# Patient Record
Sex: Male | Born: 1950
Health system: Southern US, Community
[De-identification: ages and names within clinical notes are randomized; demographics above are authoritative.]

## PROBLEM LIST (undated history)

## (undated) DIAGNOSIS — E119 Type 2 diabetes mellitus without complications: Secondary | ICD-10-CM

## (undated) DIAGNOSIS — E538 Deficiency of other specified B group vitamins: Secondary | ICD-10-CM

## (undated) DIAGNOSIS — I639 Cerebral infarction, unspecified: Secondary | ICD-10-CM

## (undated) DIAGNOSIS — E785 Hyperlipidemia, unspecified: Secondary | ICD-10-CM

## (undated) DIAGNOSIS — A809 Acute poliomyelitis, unspecified: Secondary | ICD-10-CM

## (undated) DIAGNOSIS — M5136 Other intervertebral disc degeneration, lumbar region: Secondary | ICD-10-CM

## (undated) DIAGNOSIS — I10 Essential (primary) hypertension: Secondary | ICD-10-CM

## (undated) DIAGNOSIS — H538 Other visual disturbances: Secondary | ICD-10-CM

## (undated) DIAGNOSIS — Z923 Personal history of irradiation: Secondary | ICD-10-CM

## (undated) DIAGNOSIS — M51369 Other intervertebral disc degeneration, lumbar region without mention of lumbar back pain or lower extremity pain: Secondary | ICD-10-CM

## (undated) HISTORY — DX: Other intervertebral disc degeneration, lumbar region: M51.36

## (undated) HISTORY — PX: COLONOSCOPY: SHX174

## (undated) HISTORY — DX: Hypomagnesemia: E83.42

## (undated) HISTORY — DX: Cerebral infarction, unspecified: I63.9

## (undated) HISTORY — DX: Other visual disturbances: H53.8

## (undated) HISTORY — DX: Hyperlipidemia, unspecified: E78.5

## (undated) HISTORY — DX: Other intervertebral disc degeneration, lumbar region without mention of lumbar back pain or lower extremity pain: M51.369

## (undated) HISTORY — DX: Deficiency of other specified B group vitamins: E53.8

---

## 2010-07-06 ENCOUNTER — Emergency Department (HOSPITAL_COMMUNITY)
Admission: EM | Admit: 2010-07-06 | Discharge: 2010-07-06 | Disposition: A | Discharge status: Home / Self Care | Payer: Self-pay | Attending: Emergency Medicine | Admitting: Emergency Medicine

## 2010-07-06 DIAGNOSIS — I10 Essential (primary) hypertension: Secondary | ICD-10-CM | POA: Insufficient documentation

## 2010-07-06 DIAGNOSIS — R51 Headache: Secondary | ICD-10-CM | POA: Insufficient documentation

## 2010-07-06 LAB — CBC
Hemoglobin: 16.1 g/dL (ref 13.0–17.0)
MCH: 32.3 pg (ref 26.0–34.0)
MCHC: 35.6 g/dL (ref 30.0–36.0)
MCV: 90.8 fL (ref 78.0–100.0)
Platelets: 239 10*3/uL (ref 150–400)
RBC: 4.98 MIL/uL (ref 4.22–5.81)

## 2010-07-06 LAB — BASIC METABOLIC PANEL
CO2: 25 mEq/L (ref 19–32)
Chloride: 104 mEq/L (ref 96–112)
GFR calc Af Amer: >60 mL/min (ref >60)
Potassium: 4 mEq/L (ref 3.5–5.1)

## 2010-07-06 LAB — DIFFERENTIAL
Basophils Relative: 1 % (ref 0–1)
Eosinophils Absolute: 0 10*3/uL (ref 0.0–0.7)
Lymphs Abs: 1.2 10*3/uL (ref 0.7–4.0)
Monocytes Absolute: 0.4 10*3/uL (ref 0.1–1.0)
Monocytes Relative: 8 % (ref 3–12)
Neutrophils Relative %: 72 % (ref 43–77)

## 2010-07-06 LAB — POCT CARDIAC MARKERS: Troponin i, poc: <0.05 ng/mL (ref 0.00–0.09)

## 2010-09-22 ENCOUNTER — Emergency Department (HOSPITAL_COMMUNITY): Payer: Self-pay

## 2010-09-22 ENCOUNTER — Inpatient Hospital Stay (HOSPITAL_COMMUNITY)
Admission: EM | Admit: 2010-09-22 | Discharge: 2010-09-23 | DRG: 153 | Disposition: A | Discharge status: Home / Self Care | Payer: Self-pay | Attending: Otolaryngology | Admitting: Otolaryngology

## 2010-09-22 DIAGNOSIS — J36 Peritonsillar abscess: Principal | ICD-10-CM | POA: Diagnosis present

## 2010-09-22 DIAGNOSIS — F17200 Nicotine dependence, unspecified, uncomplicated: Secondary | ICD-10-CM | POA: Diagnosis present

## 2010-09-22 LAB — CBC
HCT: 40.5 % (ref 39.0–52.0)
Hemoglobin: 14.7 g/dL (ref 13.0–17.0)
MCH: 32.4 pg (ref 26.0–34.0)
MCHC: 36.3 g/dL — ABNORMAL HIGH (ref 30.0–36.0)
MCV: 89.2 fL (ref 78.0–100.0)
RBC: 4.54 MIL/uL (ref 4.22–5.81)

## 2010-09-22 LAB — DIFFERENTIAL
Lymphs Abs: 1.6 10*3/uL (ref 0.7–4.0)
Monocytes Absolute: 1.2 10*3/uL — ABNORMAL HIGH (ref 0.1–1.0)
Monocytes Relative: 13 % — ABNORMAL HIGH (ref 3–12)
Neutro Abs: 6.7 10*3/uL (ref 1.7–7.7)
Neutrophils Relative %: 70 % (ref 43–77)

## 2010-09-22 LAB — BASIC METABOLIC PANEL
BUN: 16 mg/dL (ref 6–23)
CO2: 26 mEq/L (ref 19–32)
Chloride: 104 mEq/L (ref 96–112)
Creatinine, Ser: 0.76 mg/dL (ref 0.4–1.5)
GFR calc Af Amer: >60 mL/min (ref >60)
Glucose, Bld: 111 mg/dL — ABNORMAL HIGH (ref 70–99)

## 2010-09-22 IMAGING — CT CT NECK W/ CM
3 of 4 series · 13 of 29 positions shown, 15 images · IV contrast (100ml omni 300)
Comparison: None.

CLINICAL DATA: Severe left-sided neck pain and swelling; difficulty
swallowing.  Fever.

CT NECK WITH CONTRAST
TECHNIQUE: Multidetector CT imaging of the neck was performed with
intravenous contrast.
Contrast: 100 mL of Omnipaque 300 IV contrast

[Series 2: 2cc/30ml and 1cc/45ml · axial · 0.50mm/px · z∈[-377,-187]mm · 8 of 98 slices shown, 10 images]
[im 11/98  soft-tissue]
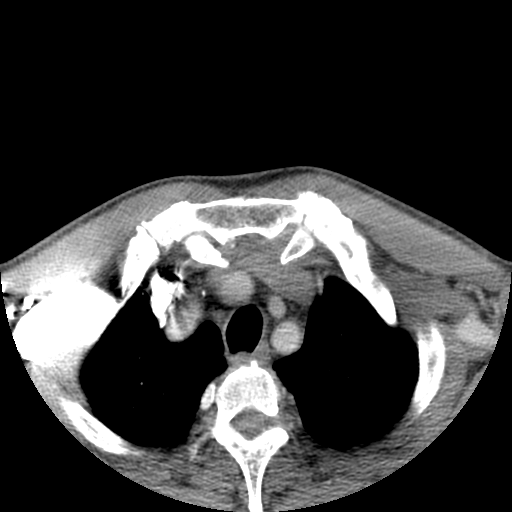
[im 11/98  bone]
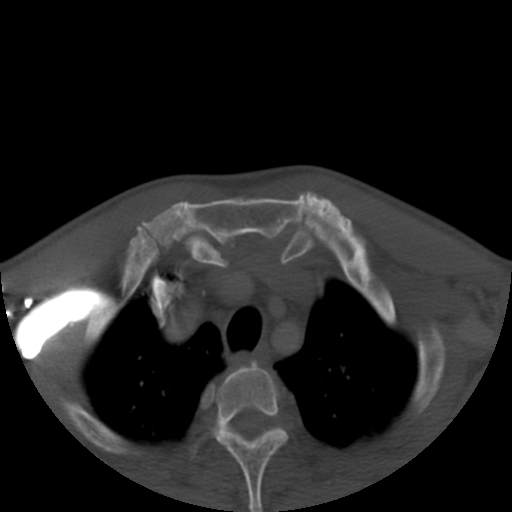
[im 22/98  bone]
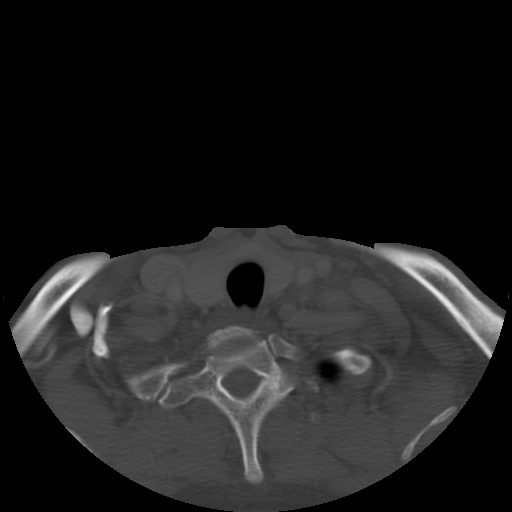
[im 33/98  bone]
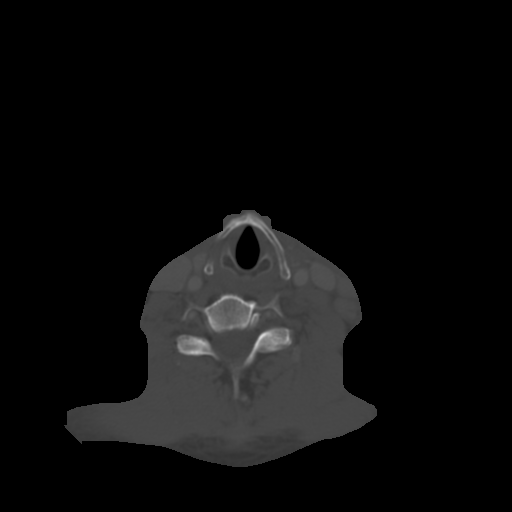
[im 44/98  bone]
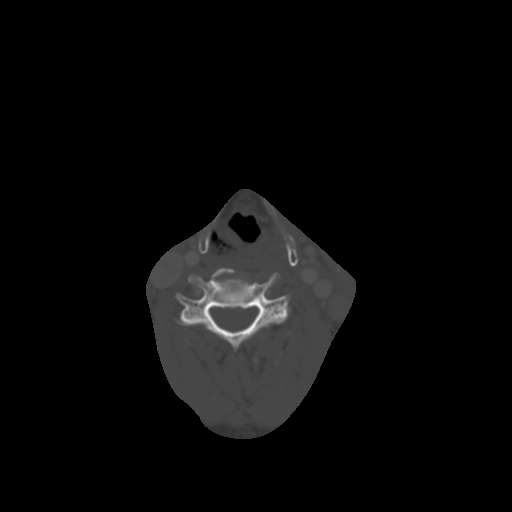
[im 54/98  soft-tissue]
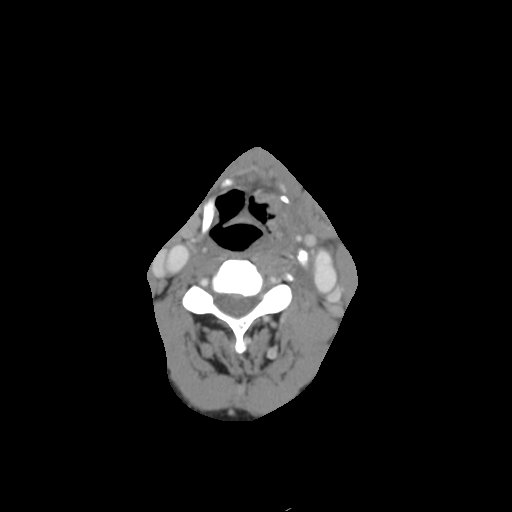
[im 54/98  bone]
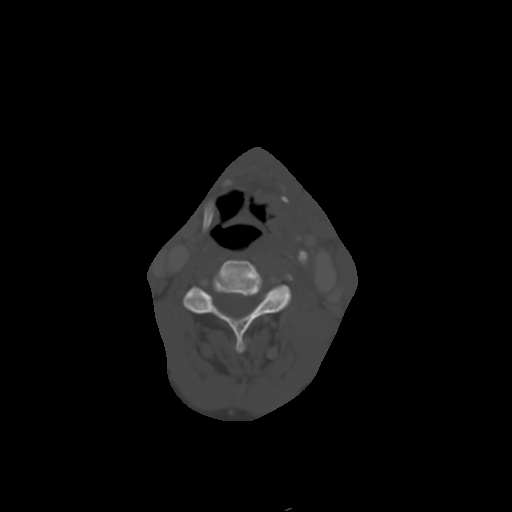
[im 65/98  bone]
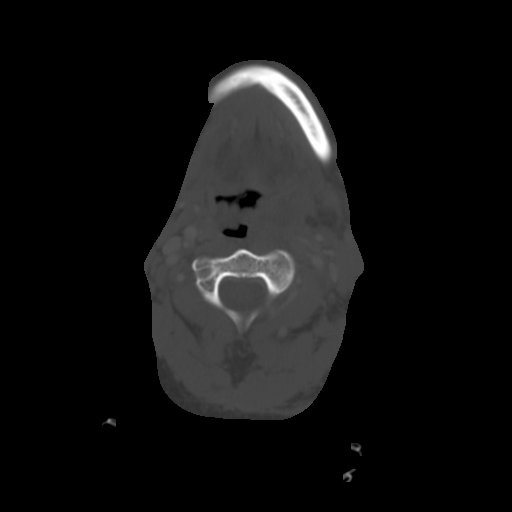
[im 76/98  bone]
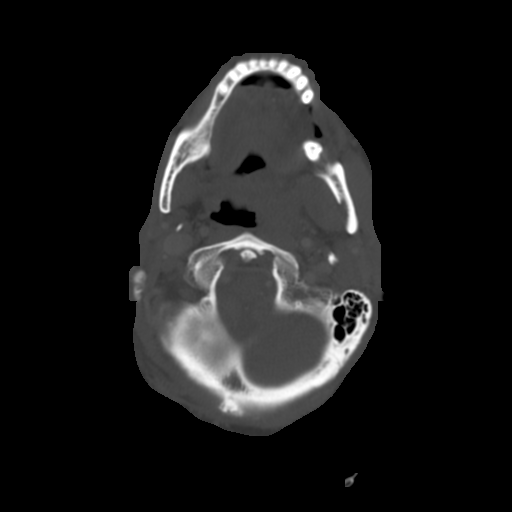
[im 87/98  bone]
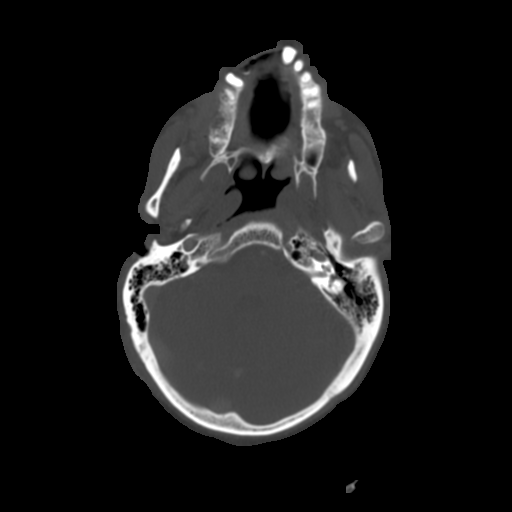

[Series 4: recon 3: 2cc/30ml and 1cc/45ml · axial · 0.50mm/px · z∈[-374,-347]mm · 2 of 34 slices shown]
[im 12/34  bone]
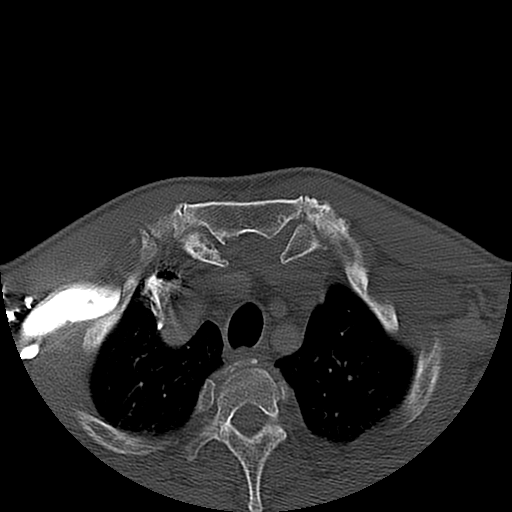
[im 23/34  bone]
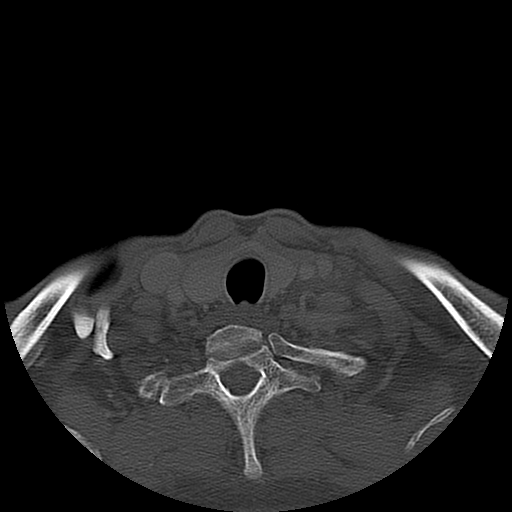

[Series 301: reformatted · coronal · 0.50mm/px · 3 of 109 slices shown]
[im 36/109  bone]
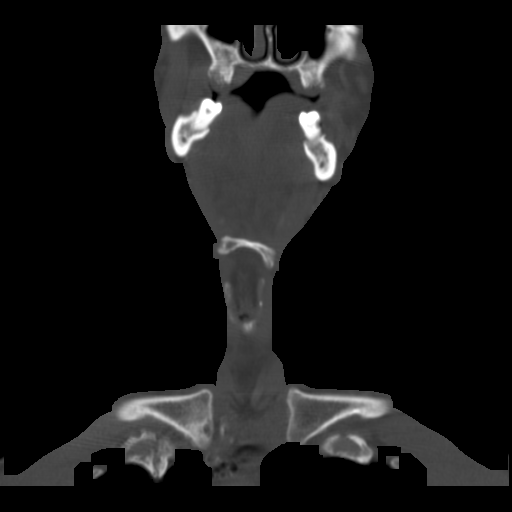
[im 45/109  bone]
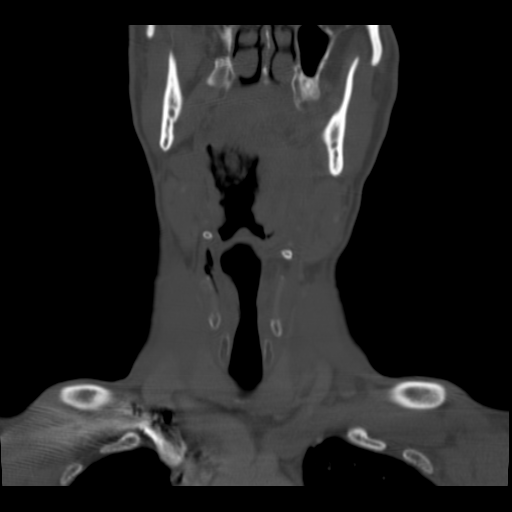
[im 53/109  bone]
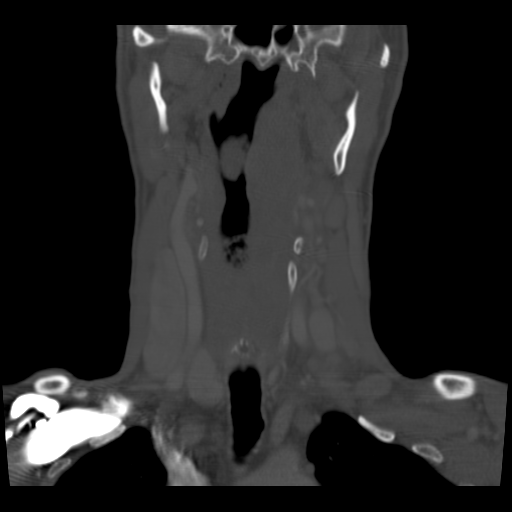

[13 of 29 positions shown; findings below may reference images not displayed]

FINDINGS: There is a focal collection of fluid noted within the
enlarged left palatine tonsil, measuring approximately 3.0 x 2.6 x
1.4 cm.  This is compatible with a peritonsillar abscess.  The left
palatine tonsil is significantly enlarged, and there is also
significant edema involving the adjacent uvula, and soft tissue
stranding involving the left parapharyngeal fat planes.

Soft tissue stranding and haziness extends inferiorly along the
left side of the neck, with mild inflammation along the left common
carotid artery and left jugular vein, and multiple enlarged
superficial cervical nodes on the left side, measuring up to 1.2 cm
in short axis.

There is also mild asymmetric enlargement of the left submandibular
gland.  There is no definite evidence of vascular compromise.
Prevertebral soft tissues remain within normal limits.

A small amount of fluid and debris is noted within the piriform
sinuses.  There is mass effect on the oropharynx and superior
hypopharynx from the left sided edema, but much of the hypopharynx
remains nearly normal in caliber.

Mild prominence of the right-sided soft tissues just above the
vocal cords may reflect transient debris, or possibly edema.  The
proximal trachea is unremarkable in appearance.  The thyroid gland
is grossly unremarkable, with a small benign-appearing 0.6 cm
hypodensity in the right thyroid lobe.  The visualized superior
mediastinum is unremarkable in appearance.  The visualized lung
apices are clear.

No acute osseous abnormalities are identified.  Multiple maxillary
and mandibular teeth are absent.  The visualized paranasal sinuses
and mastoid air cells are well-aerated.  The visualized portions of
the brain are within normal limits.
IMPRESSION: 1.  Focal 3.0 x 2.6 x 1.4 cm left-sided peritonsillar abscess noted
within the enlarged left palatine tonsil.
2.  Soft tissue edema involves the left palatine tonsil and
adjacent uvula, with partial effacement of the oropharynx and
superior hypopharynx.
3.  Soft tissue stranding and haziness extends inferiorly along the
left side of the neck, from the parapharyngeal fat planes along the
left common carotid artery and left jugular vein, with multiple
enlarged left-sided superficial cervical nodes measuring up to
cm in short axis.  No evidence of vascular compromise.
4.  Mild asymmetric enlargement of the left submandibular gland.
5.  Small amount of fluid and debris noted within the piriform
sinuses.
6.  Mild prominence of right-sided soft tissues just above the
vocal cords, reflecting transient debris or possibly edema.

Findings were discussed with Dr. GABRIEL SEBASTIAN at [DATE] a.m. on
[DATE].

## 2010-09-22 MED ORDER — IOHEXOL 300 MG/ML  SOLN
100.0000 mL | Freq: Once | INTRAMUSCULAR | Status: AC | PRN
  Administered 2010-09-22: 100 mL via INTRAVENOUS

## 2010-12-01 NOTE — Discharge Summary (Signed)
  NAMEJOHNRYAN, SAO NO.:  [id number]  MEDICAL RECORD NO.:  [id number]  LOCATION:  5118                         FACILITY:  [facility]  PHYSICIAN:  Suzanna Obey, M.D.       DATE OF BIRTH:  1950/09/26  DATE OF ADMISSION:  09/22/2010 DATE OF DISCHARGE:  09/23/2010                              DISCHARGE SUMMARY   ADMISSION DIAGNOSIS:  Peritonsillar abscess.  DISCHARGE DIAGNOSIS:  Peritonsillar abscess.  HOSPITAL COURSE:  This patient was admitted, had an incision and drainage in the emergency room of a peritonsillar abscess and did well. He requested admission for persistent pain and was admitted overnight. In the morning, his pain was resolved and he was feeling great, taking fluids well, substantial decrease in the swelling of the palate and uvula, discharged to home to follow up in 2 weeks.  Meds given for antibiotics and pain medicine that are present in the chart.          ______________________________ Suzanna Obey, M.D.    JB/MEDQ  D:  11/24/2010  T:  11/24/2010  Job:  [id number]  Electronically Signed by Suzanna Obey M.D. on 12/01/2010 10:25:43 AM

## 2020-06-04 ENCOUNTER — Other Ambulatory Visit: Payer: Self-pay

## 2020-06-04 ENCOUNTER — Ambulatory Visit (HOSPITAL_COMMUNITY)
Admission: EM | Admit: 2020-06-04 | Discharge: 2020-06-04 | Disposition: A | Discharge status: Home / Self Care | Payer: Self-pay

## 2020-06-04 ENCOUNTER — Encounter (HOSPITAL_COMMUNITY): Payer: Self-pay

## 2020-06-04 ENCOUNTER — Emergency Department (HOSPITAL_COMMUNITY): Payer: Self-pay

## 2020-06-04 ENCOUNTER — Emergency Department (HOSPITAL_COMMUNITY)
Admission: EM | Admit: 2020-06-04 | Discharge: 2020-06-04 | Disposition: A | Discharge status: Home / Self Care | Payer: Self-pay | Attending: Emergency Medicine | Admitting: Emergency Medicine

## 2020-06-04 DIAGNOSIS — I10 Essential (primary) hypertension: Secondary | ICD-10-CM | POA: Insufficient documentation

## 2020-06-04 DIAGNOSIS — R519 Headache, unspecified: Secondary | ICD-10-CM | POA: Insufficient documentation

## 2020-06-04 DIAGNOSIS — Z87891 Personal history of nicotine dependence: Secondary | ICD-10-CM | POA: Insufficient documentation

## 2020-06-04 DIAGNOSIS — Z79899 Other long term (current) drug therapy: Secondary | ICD-10-CM | POA: Insufficient documentation

## 2020-06-04 HISTORY — DX: Essential (primary) hypertension: I10

## 2020-06-04 HISTORY — DX: Acute poliomyelitis, unspecified: A80.9

## 2020-06-04 LAB — COMPREHENSIVE METABOLIC PANEL
ALT: 27 U/L (ref 0–44)
AST: 23 U/L (ref 15–41)
Albumin: 3.2 g/dL — ABNORMAL LOW (ref 3.5–5.0)
Alkaline Phosphatase: 92 U/L (ref 38–126)
Anion gap: 8 (ref 5–15)
BUN: 18 mg/dL (ref 8–23)
CO2: 25 mmol/L (ref 22–32)
Calcium: 10 mg/dL (ref 8.9–10.3)
Chloride: 108 mmol/L (ref 98–111)
Creatinine, Ser: 1.02 mg/dL (ref 0.61–1.24)
GFR, Estimated: >60 mL/min (ref >60)
Glucose, Bld: 112 mg/dL — ABNORMAL HIGH (ref 70–99)
Potassium: 3.7 mmol/L (ref 3.5–5.1)
Sodium: 141 mmol/L (ref 135–145)
Total Bilirubin: 1 mg/dL (ref 0.3–1.2)
Total Protein: 6.5 g/dL (ref 6.5–8.1)

## 2020-06-04 LAB — CBC WITH DIFFERENTIAL/PLATELET
Abs Immature Granulocytes: 0.02 10*3/uL (ref 0.00–0.07)
Basophils Absolute: 0 10*3/uL (ref 0.0–0.1)
Basophils Relative: 0 %
Eosinophils Absolute: 0.1 10*3/uL (ref 0.0–0.5)
Eosinophils Relative: 2 %
HCT: 43.4 % (ref 39.0–52.0)
Hemoglobin: 15 g/dL (ref 13.0–17.0)
Immature Granulocytes: 0 %
Lymphocytes Relative: 38 %
Lymphs Abs: 2.1 10*3/uL (ref 0.7–4.0)
MCH: 32.5 pg (ref 26.0–34.0)
MCHC: 34.6 g/dL (ref 30.0–36.0)
MCV: 93.9 fL (ref 80.0–100.0)
Monocytes Absolute: 0.7 10*3/uL (ref 0.1–1.0)
Monocytes Relative: 13 %
Neutro Abs: 2.5 10*3/uL (ref 1.7–7.7)
Neutrophils Relative %: 47 %
Platelets: 202 10*3/uL (ref 150–400)
RBC: 4.62 MIL/uL (ref 4.22–5.81)
RDW: 13.7 % (ref 11.5–15.5)
WBC: 5.4 10*3/uL (ref 4.0–10.5)
nRBC: 0 % (ref 0.0–0.2)

## 2020-06-04 LAB — TROPONIN I (HIGH SENSITIVITY): Troponin I (High Sensitivity): 23 ng/L — ABNORMAL HIGH (ref <18)

## 2020-06-04 LAB — BRAIN NATRIURETIC PEPTIDE: B Natriuretic Peptide: 84.6 pg/mL (ref 0.0–100.0)

## 2020-06-04 LAB — CBG MONITORING, ED: Glucose-Capillary: 103 mg/dL — ABNORMAL HIGH (ref 70–99)

## 2020-06-04 IMAGING — DX DG CHEST 2V
2 series · 2 of 2 positions shown · non-contrast
Comparison: None.

CLINICAL DATA: Hypertension

EXAM:
CHEST - 2 VIEW

[chest pa]
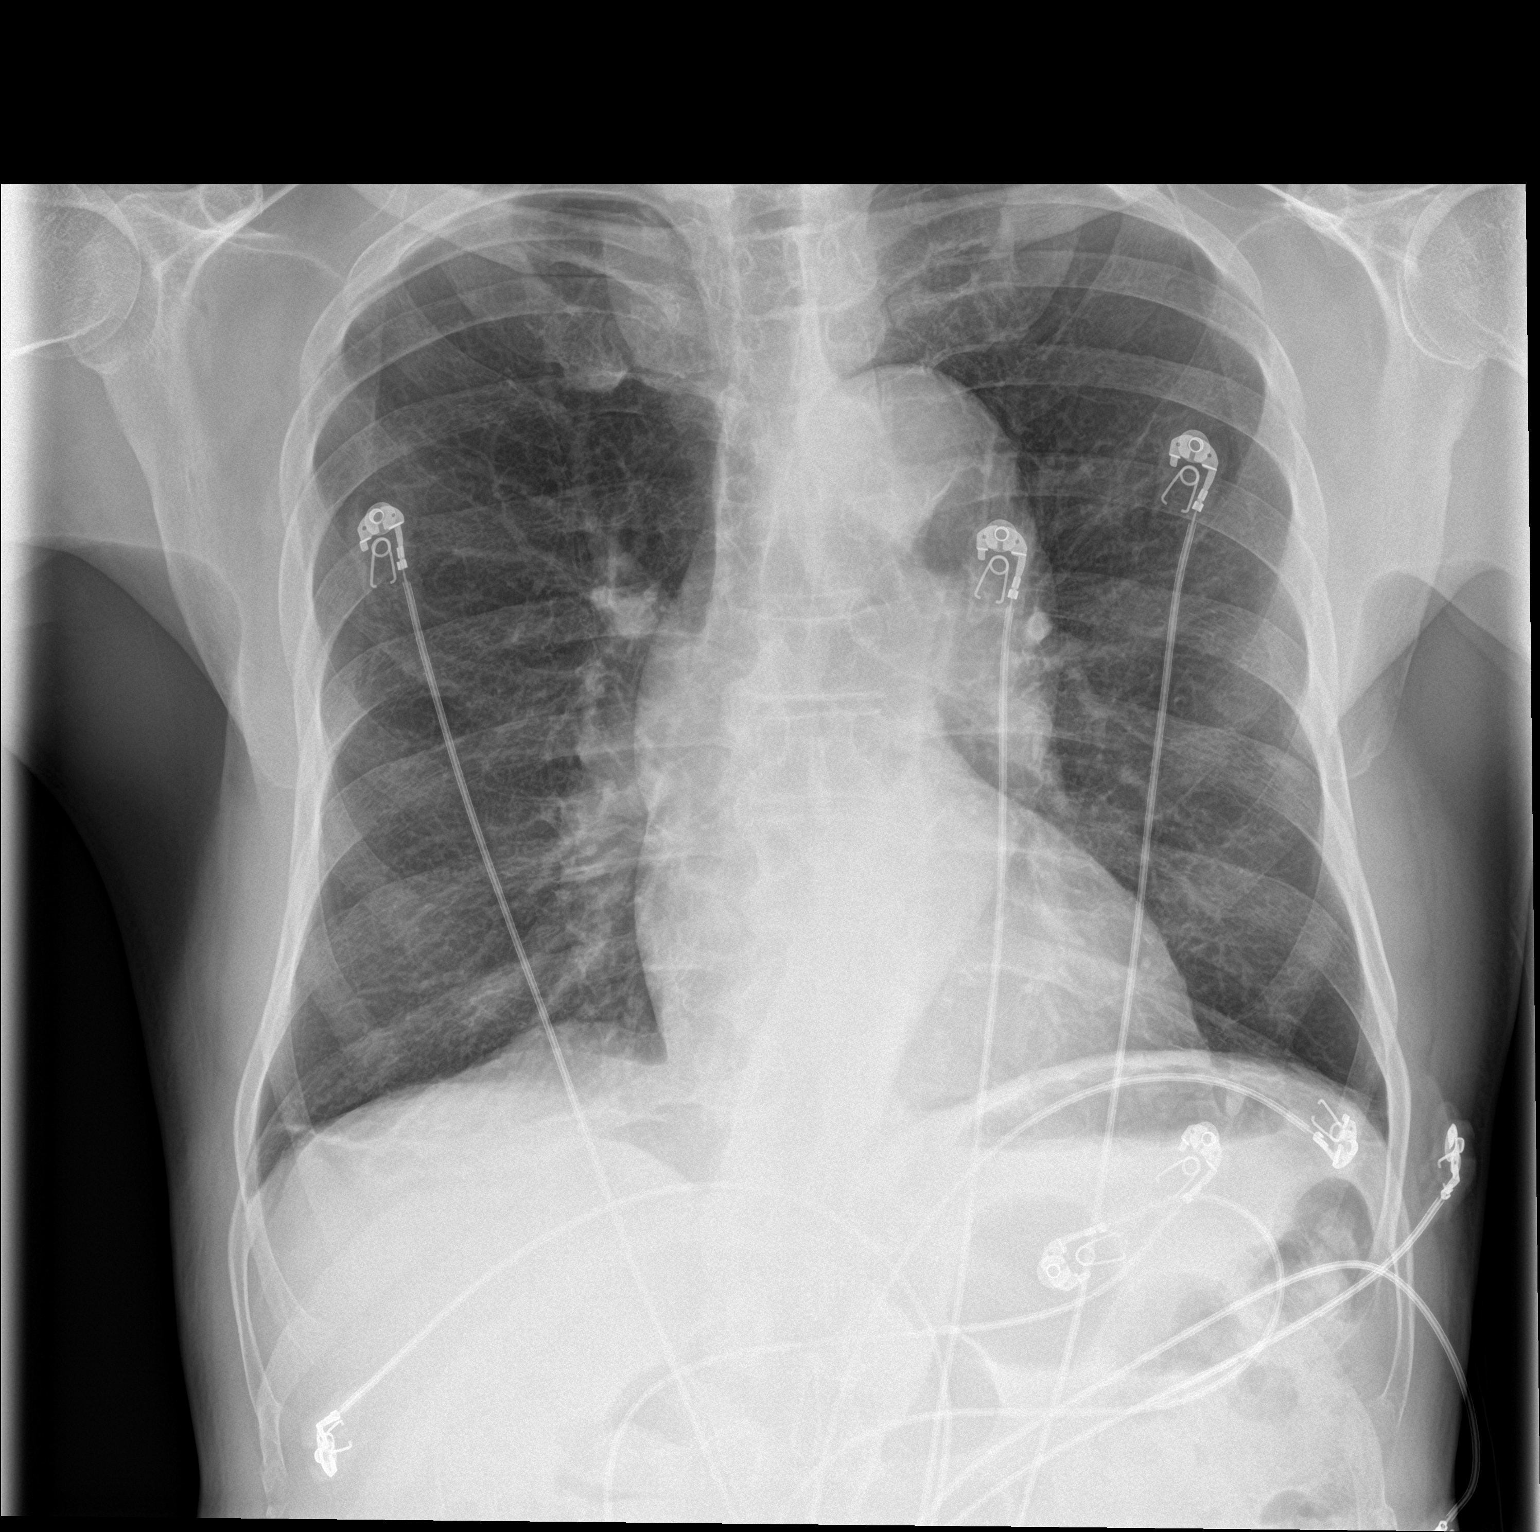

[chest lat]
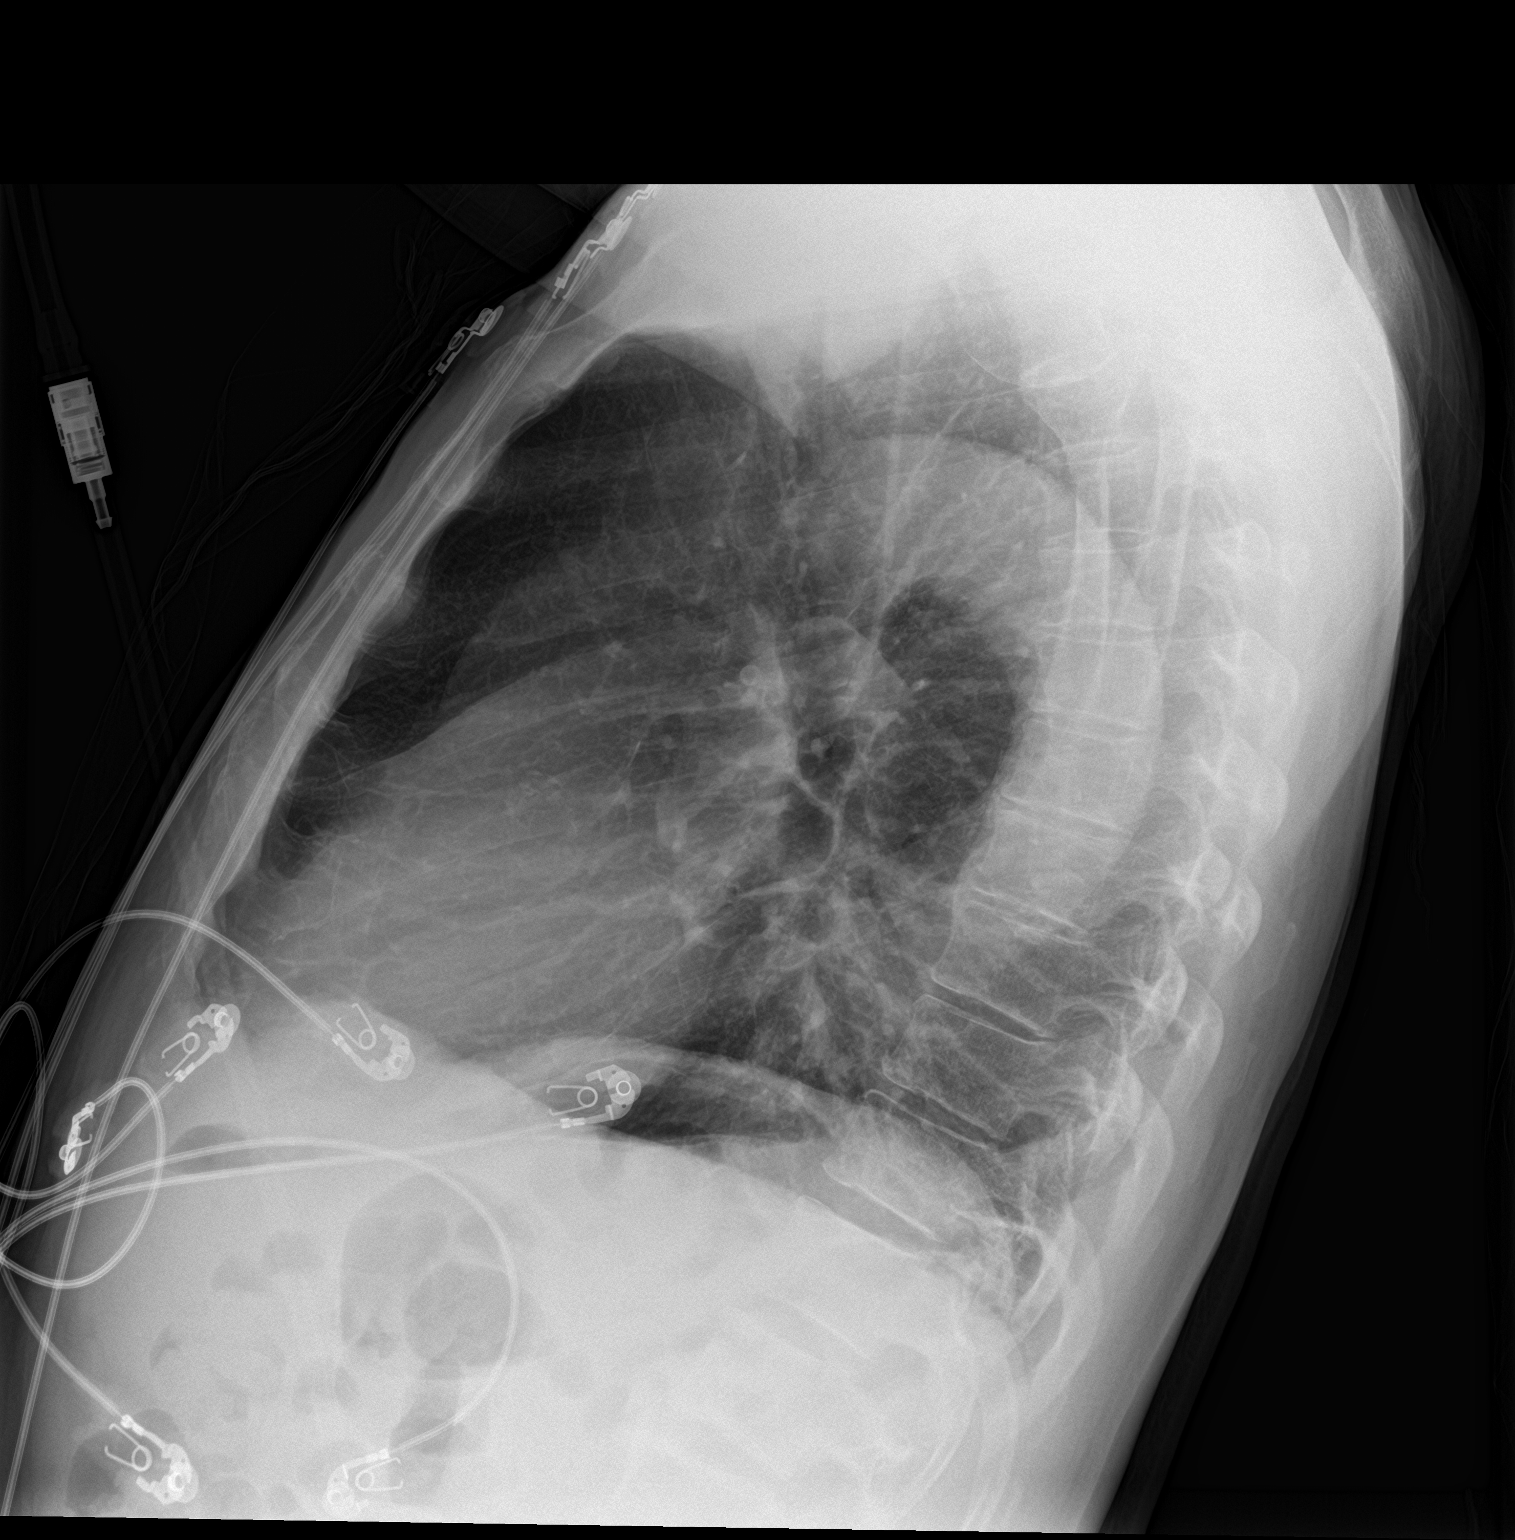

[2 of 2 positions shown; findings below may reference images not displayed]

FINDINGS: Heart size is normal. Atherosclerosis of the aorta with tortuosity.
Pulmonary vascularity is normal. The lungs are clear. No effusions.
No significant bone finding.
IMPRESSION: No active disease. Aortic atherosclerosis and tortuosity.

## 2020-06-04 IMAGING — CT CT HEAD W/O CM
4 series · 17 of 47 positions shown, 19 images · non-contrast
Comparison: None.

CLINICAL DATA: 69-year-old male with headache.

EXAM:
CT HEAD WITHOUT CONTRAST
TECHNIQUE: Contiguous axial images were obtained from the base of the skull
through the vertex without intravenous contrast.

[Series 3: head without · axial · non-contrast · 0.41mm/px · z∈[-208,-88]mm · 7 of 33 slices shown, 9 images]
[im 5/33  brain]
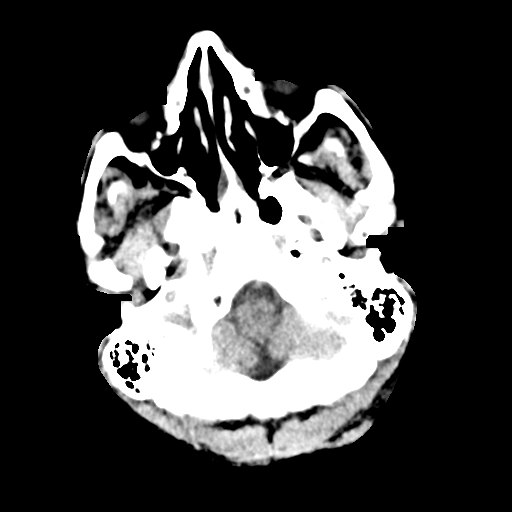
[im 5/33  bone]
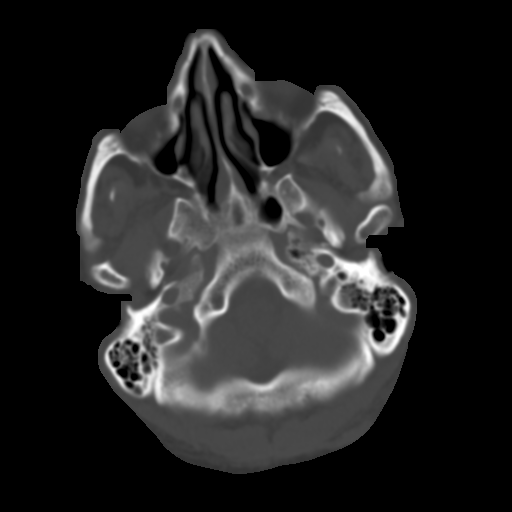
[im 9/33  brain]
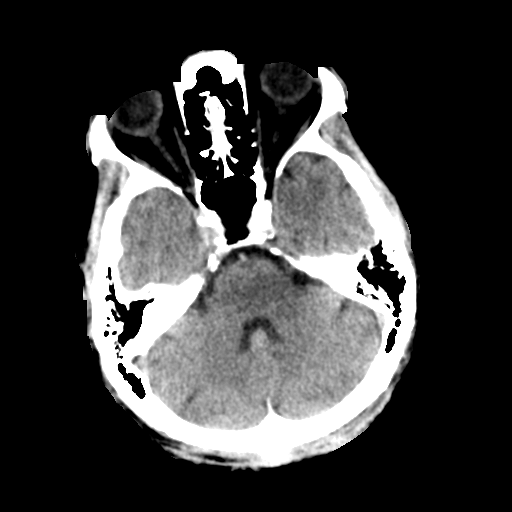
[im 13/33  brain]
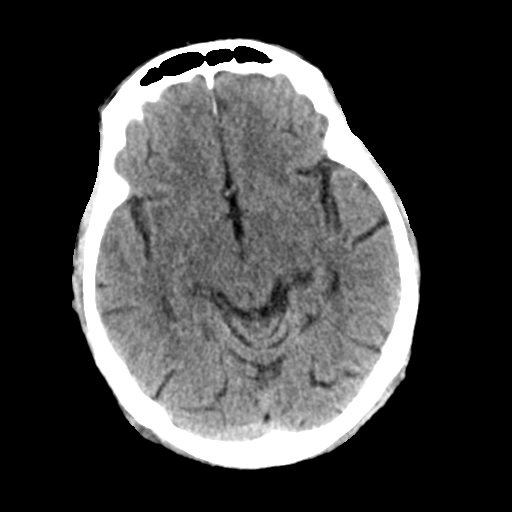
[im 17/33  brain]
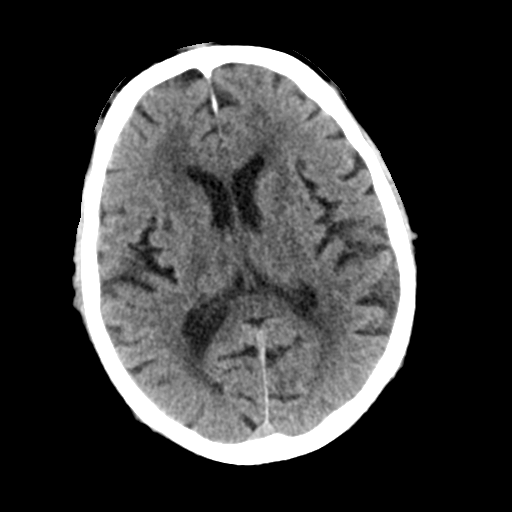
[im 21/33  brain]
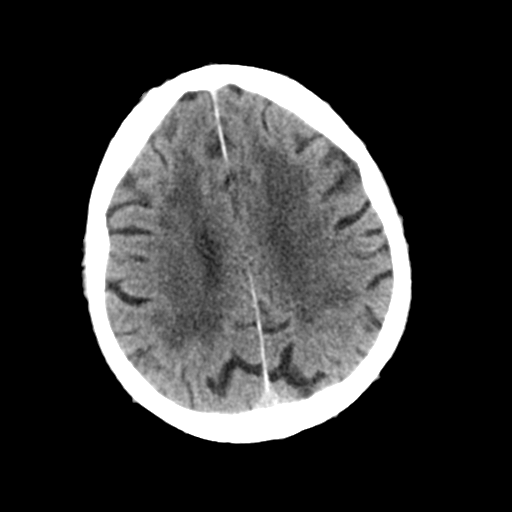
[im 21/33  bone]
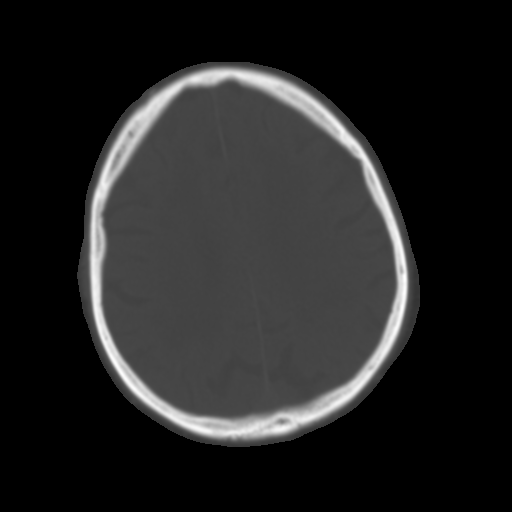
[im 25/33  brain]
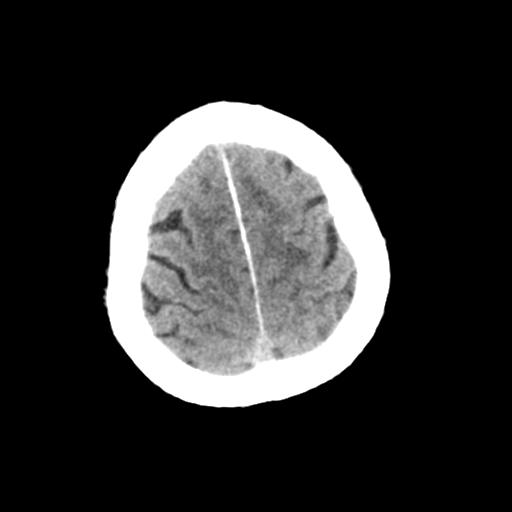
[im 29/33  brain]
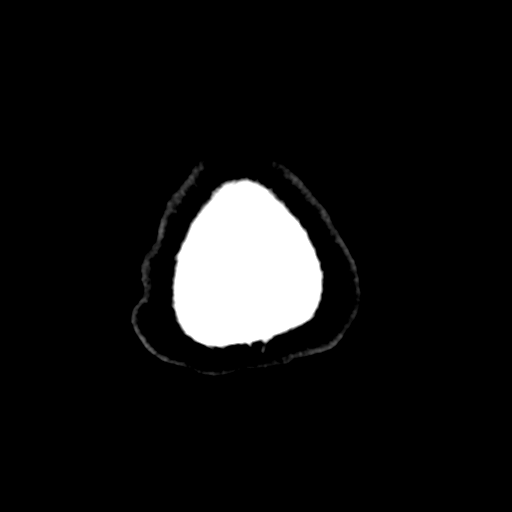

[Series 4: head bone · axial · 0.41mm/px · z∈[-212,-156]mm · 4 of 82 slices shown]
[im 9/82  bone]
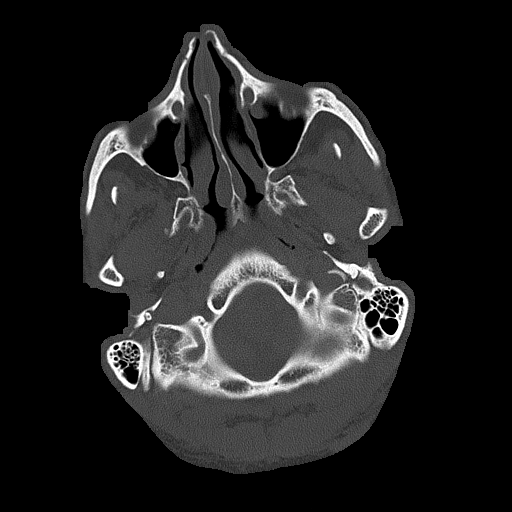
[im 17/82  bone]
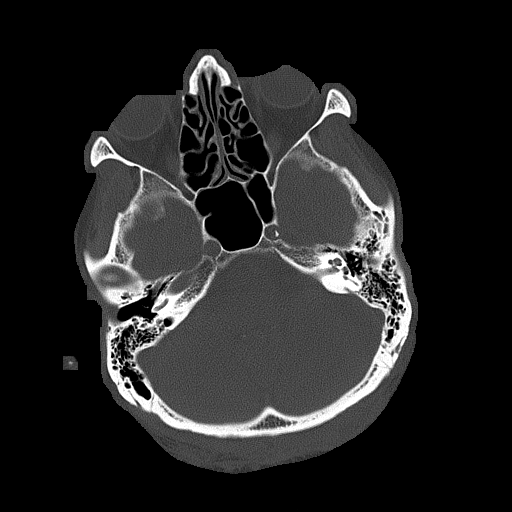
[im 25/82  bone]
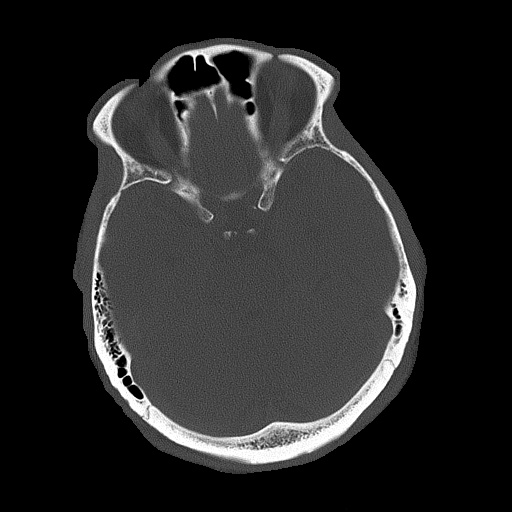
[im 37/82  bone]
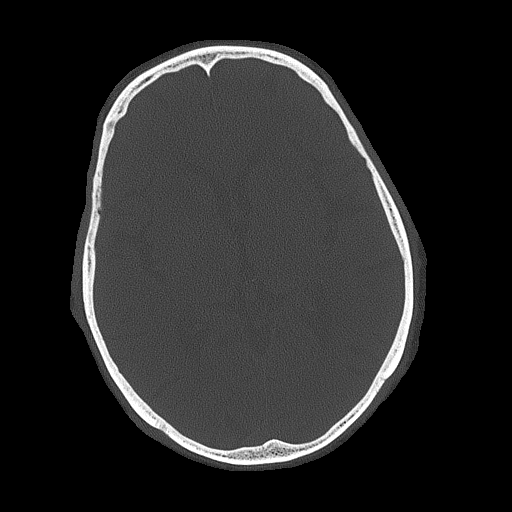

[Series 5: head without cor · coronal · non-contrast · 0.32mm/px · 3 of 67 slices shown]
[im 23/67  brain]
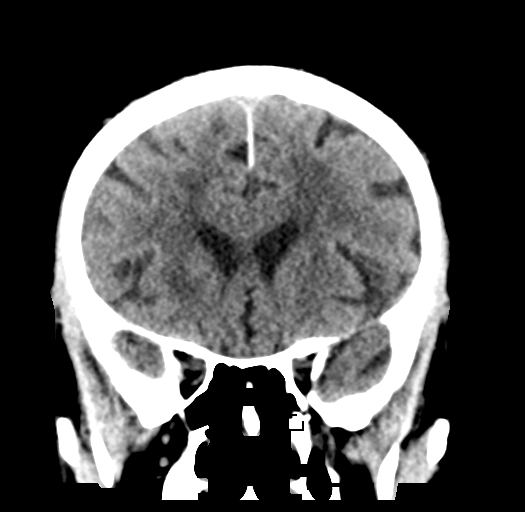
[im 30/67  brain]
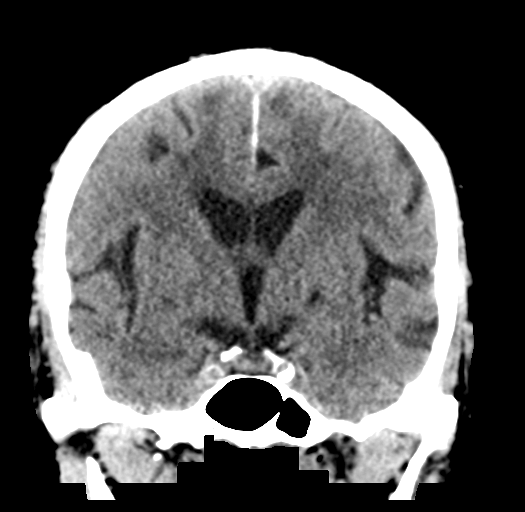
[im 37/67  brain]
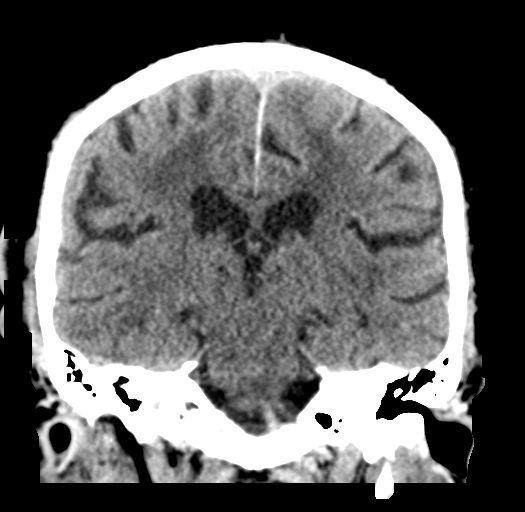

[Series 6: head without sag · sagittal · non-contrast · 0.32mm/px · 3 of 60 slices shown]
[im 20/60  brain]
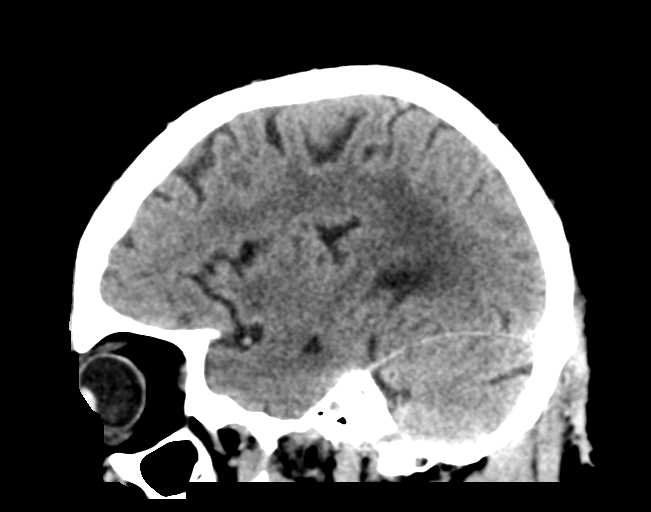
[im 30/60  brain]
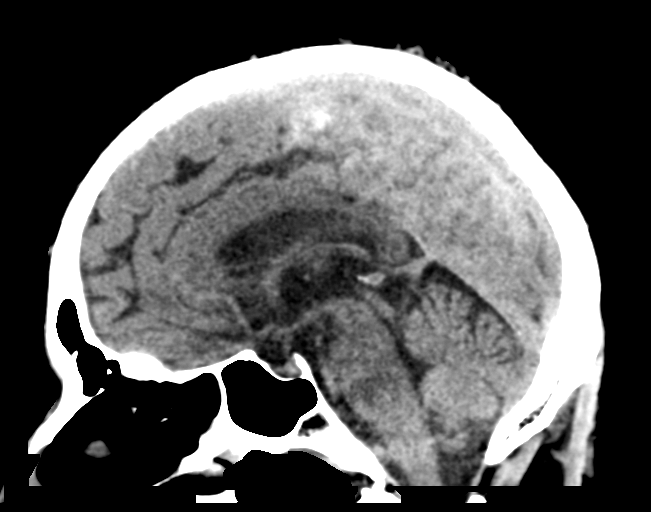
[im 40/60  brain]
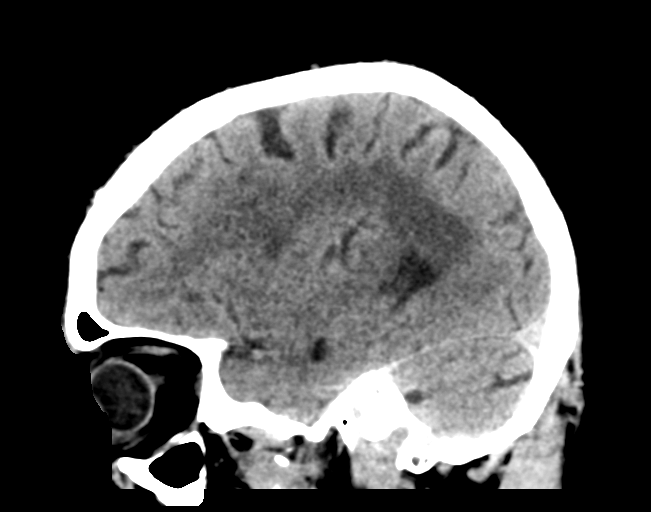

[17 of 47 positions shown; findings below may reference images not displayed]

FINDINGS: Brain: Mild age-related atrophy and moderate chronic microvascular
ischemic changes. There is no acute intracranial hemorrhage. No mass
effect or midline shift. No extra-axial fluid collection.

Vascular: No hyperdense vessel or unexpected calcification.

Skull: Normal. Negative for fracture or focal lesion.

Sinuses/Orbits: Mild mucoperiosteal thickening of paranasal sinuses.
No air-fluid level. The mastoid air cells are clear.

Other: None
IMPRESSION: 1. No acute intracranial pathology.
2. Age-related atrophy and chronic microvascular ischemic changes.

## 2020-06-04 MED ORDER — AMLODIPINE BESYLATE 5 MG PO TABS
5.0000 mg | ORAL_TABLET | Freq: Once | ORAL | Status: AC
  Administered 2020-06-04: 5 mg via ORAL
  Filled 2020-06-04: qty 1

## 2020-06-04 MED ORDER — AMLODIPINE BESYLATE 5 MG PO TABS: 5.0000 mg | ORAL_TABLET | Freq: Every day | ORAL | 0 refills | Status: DC

## 2020-06-04 NOTE — ED Provider Notes (Signed)
[facility] EMERGENCY DEPARTMENT Provider Note   CSN: [id number] Arrival date & time: 06/04/20  1901     History Chief Complaint  Patient presents with  . Code STEMI  . Hypertension  . Headache    Gavin Dennis is a 70 y.o. male who presents today from urgent care for concern of a code STEMI.  On arrival code STEMI was canceled by Dr. Gilford Raid.  Patient states that he went to urgent care today as he states that since he turned 8 in September he has had a headache in the front of his head every single day.  He states that he has not had his blood pressure medicines in 4 years because he is "hard headed."  He denies any vision changes, worsening in his headache, fevers or any other changes simply stating "enough is enough."  He denies any chest pain, chest pressure/discomfort or shortness of breath.  He states that he does not get sick and has not been ill recently.  About a month and a half ago patient reportedly woke up 1 day and decided that he was done smoking, drinking any alcohol and using crack and stopped all of these cold [place].    HPI     Past Medical History:  Diagnosis Date  . Hypertension   . Polio     There are no problems to display for this patient.   History reviewed. No pertinent surgical history.     No family history on file.  Social History   Tobacco Use  . Smoking status: Former Smoker    Years: 30.00    Types: Cigarettes  . Smokeless tobacco: Never Used  . Tobacco comment: quit a mo ago  Substance Use Topics  . Alcohol use: Not Currently    Comment: quit a mo ago  . Drug use: Not Currently    Types: Cocaine    Home Medications Prior to Admission medications   Medication Sig Start Date End Date Taking? Authorizing Provider  amLODipine (NORVASC) 5 MG tablet Take 1 tablet (5 mg total) by mouth daily. 06/04/20 07/04/20 Yes Lorin Glass, PA-C    Allergies    Patient has no known allergies.  Review of Systems    Review of Systems  Constitutional: Negative for chills, diaphoresis, fatigue and fever.  HENT: Negative for dental problem, facial swelling, sinus pressure and sinus pain.   Eyes: Negative for pain and visual disturbance.  Respiratory: Negative for cough, chest tightness and shortness of breath.   Cardiovascular: Negative for chest pain, palpitations and leg swelling.  Gastrointestinal: Negative for abdominal pain, nausea and vomiting.  Genitourinary: Negative for dysuria.  Musculoskeletal: Negative for back pain and neck pain.  Neurological: Positive for headaches. Negative for speech difficulty, weakness and light-headedness.  All other systems reviewed and are negative.   Physical Exam Updated Vital Signs BP (!) 211/139   Pulse 71   Temp 98 F (36.7 C) (Oral)   Resp 20   Ht 5\' 11"  (1.803 m)   Wt 72.6 kg   SpO2 100%   BMI 22.32 kg/m   Physical Exam Vitals and nursing note reviewed.  Constitutional:      General: He is not in acute distress.    Appearance: He is not diaphoretic.  HENT:     Head: Normocephalic and atraumatic.  Eyes:     General: No scleral icterus.       Right eye: No discharge.        Left  eye: No discharge.     Conjunctiva/sclera: Conjunctivae normal.  Cardiovascular:     Rate and Rhythm: Normal rate and regular rhythm.     Heart sounds: Normal heart sounds. No murmur heard. No friction rub.  Pulmonary:     Effort: Pulmonary effort is normal. No respiratory distress.     Breath sounds: Normal breath sounds. No stridor.  Abdominal:     General: There is no distension.     Tenderness: There is no abdominal tenderness.  Musculoskeletal:        General: No deformity.     Cervical back: Normal range of motion.  Skin:    General: Skin is warm and dry.  Neurological:     Mental Status: He is alert and oriented to person, place, and time.     GCS: GCS eye subscore is 4. GCS verbal subscore is 5. GCS motor subscore is 6.     Motor: No abnormal  muscle tone.     Comments: Patient is awake and alert, answers all questions appropriately.  Speech is not slurred.  Psychiatric:        Mood and Affect: Mood normal.     Comments: Mood and behavior are appropriate for situation     ED Results / Procedures / Treatments   Labs (all labs ordered are listed, but only abnormal results are displayed) Labs Reviewed  COMPREHENSIVE METABOLIC PANEL - Abnormal; Notable for the following components:      Result Value   Glucose, Bld 112 (*)    Albumin 3.2 (*)    All other components within normal limits  CBG MONITORING, ED - Abnormal; Notable for the following components:   Glucose-Capillary 103 (*)    All other components within normal limits  TROPONIN I (HIGH SENSITIVITY) - Abnormal; Notable for the following components:   Troponin I (High Sensitivity) 23 (*)    All other components within normal limits  CBC WITH DIFFERENTIAL/PLATELET  BRAIN NATRIURETIC PEPTIDE  TROPONIN I (HIGH SENSITIVITY)    EKG None  Radiology DG Chest 2 View  Result Date: 06/04/2020 CLINICAL DATA:  Hypertension EXAM: CHEST - 2 VIEW COMPARISON:  None. FINDINGS: Heart size is normal. Atherosclerosis of the aorta with tortuosity. Pulmonary vascularity is normal. The lungs are clear. No effusions. No significant bone finding. IMPRESSION: No active disease. Aortic atherosclerosis and tortuosity. Electronically Signed   By: Nelson Chimes M.D.   On: 06/04/2020 19:47   CT Head Wo Contrast  Result Date: 06/04/2020 CLINICAL DATA:  70 year old male with headache. EXAM: CT HEAD WITHOUT CONTRAST TECHNIQUE: Contiguous axial images were obtained from the base of the skull through the vertex without intravenous contrast. COMPARISON:  None. FINDINGS: Brain: Mild age-related atrophy and moderate chronic microvascular ischemic changes. There is no acute intracranial hemorrhage. No mass effect or midline shift. No extra-axial fluid collection. Vascular: No hyperdense vessel or unexpected  calcification. Skull: Normal. Negative for fracture or focal lesion. Sinuses/Orbits: Mild mucoperiosteal thickening of paranasal sinuses. No air-fluid level. The mastoid air cells are clear. Other: None IMPRESSION: 1. No acute intracranial pathology. 2. Age-related atrophy and chronic microvascular ischemic changes. Electronically Signed   By: Anner Crete M.D.   On: 06/04/2020 20:31    Procedures Procedures   Medications Ordered in ED Medications  amLODipine (NORVASC) tablet 5 mg (5 mg Oral Given 06/04/20 2045)    ED Course  I have reviewed the triage vital signs and the nursing notes.  Pertinent labs & imaging results that were available during  my care of the patient were reviewed by me and considered in my medical decision making (see chart for details).  Clinical Course as of 06/04/20 2334  Fri Jun 04, 2020  1910 Per Dr. Gilford Raid cancel code stemi [EH]    Clinical Course User Index [EH] Ollen Gross   MDM Rules/Calculators/A&P                         Patient is a 70 year old gentleman who presents today for code STEMI.  This was canceled on arrival by Dr. Gilford Raid.  Patient does not have any chest pain or shortness of breath and had originally gone to urgent care due to constant headache for the past 5 months and was found to be hypertensive.  He does not have any chest pain/discomfort.  Troponin is 23, not consistent with MI. CBC and CMP are unremarkable. BNP is not elevated, chest x-ray shows no active disease, does show a tortuous aorta however he is not having chest pain shortness of breath or any other symptoms that would be suggestive of dissection.  Given this chronic headache CT head was obtained without acute abnormality. He is started on p.o. Norvasc and given a prescription for this. Case management is consulted for assistance and PCP.  This patient was discussed with Dr. Gilford Raid.     Return precautions were discussed with patient who states their  understanding.  At the time of discharge patient denied any unaddressed complaints or concerns.  Patient is agreeable for discharge home.  Note: Portions of this report may have been transcribed using voice recognition software. Every effort was made to ensure accuracy; however, inadvertent computerized transcription errors may be present.   Final Clinical Impression(s) / ED Diagnoses Final diagnoses:  Hypertension, unspecified type  Chronic daily headache    Rx / DC Orders ED Discharge Orders         Ordered    amLODipine (NORVASC) 5 MG tablet  Daily        06/04/20 2123           Ollen Gross 06/04/20 2336    Isla Pence, MD 06/07/20 [phone]

## 2020-06-04 NOTE — Discharge Instructions (Addendum)
As we discussed today I have given you a prescription to start lowering your blood pressure.  This has been high for an extended period of time and therefore our goal is to slowly have it be lowered. It is important that you establish care with a primary care doctor for continued blood pressure medicine refills.  When your blood pressure is high it is doing damage even if you cannot feel it.  If your symptoms change, you develop any change in your headache, any chest pain/discomfort, shortness of breath or have any new or concerning symptoms please seek additional medical care and evaluation.  I have given you a month supply of amlodipine, however please still attempt to get seen by a primary care doctor as soon as possible.

## 2020-06-04 NOTE — ED Notes (Signed)
EMS notified us that patient was a Code Stemi and was taken straight to cath lab.

## 2020-06-04 NOTE — ED Triage Notes (Signed)
Pt presented today with complaint of elevated blood pressure and headache x 1 year. Denies chest pain or SOB. Present with step-daughter.

## 2020-06-04 NOTE — Progress Notes (Signed)
CSW met with Pt at bedside. CSW offered [facility] resources and discussed importance of seeing PCP regularly. Pt verbalized understanding and daughter offered help with making appointment.   06/04/20 2130  TOC ED Mini Assessment  TOC Time spent with patient (minutes): 20  PING Used in TOC Assessment No  Admission or Readmission Diverted Yes  Interventions which prevented an admission or readmission Other (must enter comment) (PCP resources provided)  What brought you to the Emergency Department?  Code stemi  Barriers to Discharge No Barriers Identified  Barrier interventions Pt counseled on the importance of primary care.  Pt's daughter present and is able to assist with making/keeping PCP appointments.  Means of departure Not know

## 2020-06-04 NOTE — ED Triage Notes (Addendum)
Pt arrived [facility] from [facility] as a code STEMI. Per EMS pt has elevation in leads V2-V4. Pt is A&Ox4. Pt is very hypertensive. Pt is NSR on monitor. Pt denies chest pain. Pt states has 2/10 head pain. Pt denies vision changes. EMS gave ASA 324 mg and nitro 0.4 mg

## 2020-06-07 ENCOUNTER — Telehealth: Payer: Self-pay

## 2020-06-07 NOTE — Telephone Encounter (Signed)
Patient is calling to set up financial Assistance appt with Clifton Elion. He has a hospital Follow up with Sharyn Lull on 06/22/20. Please advise CB- [phone]

## 2020-06-07 NOTE — Telephone Encounter (Signed)
Call patient at both number.  Left message on voicemail to return call.   Scheduled an appointment for patient with Financial counselor this week- 06/10/2020 at 1500. Patient will need to be notified.

## 2020-06-08 ENCOUNTER — Telehealth: Payer: Self-pay

## 2020-06-08 NOTE — Telephone Encounter (Signed)
I called the Pt to inform that he need to see the PCP first before he can see the financial to apply for the CAFA and OC program, unable to LVM, appt was cancel for this reason

## 2020-06-10 ENCOUNTER — Ambulatory Visit: Payer: Self-pay

## 2020-06-22 ENCOUNTER — Ambulatory Visit (INDEPENDENT_AMBULATORY_CARE_PROVIDER_SITE_OTHER): Payer: Self-pay | Admitting: Primary Care

## 2020-06-22 ENCOUNTER — Other Ambulatory Visit: Payer: Self-pay

## 2020-06-22 ENCOUNTER — Encounter (INDEPENDENT_AMBULATORY_CARE_PROVIDER_SITE_OTHER): Payer: Self-pay | Admitting: Primary Care

## 2020-06-22 VITALS — BP 179/118 | HR 60 | Temp 97.5°F | Ht 71.0 in | Wt 173.8 lb

## 2020-06-22 DIAGNOSIS — I10 Essential (primary) hypertension: Secondary | ICD-10-CM

## 2020-06-22 DIAGNOSIS — Z7689 Persons encountering health services in other specified circumstances: Secondary | ICD-10-CM

## 2020-06-22 DIAGNOSIS — Z23 Encounter for immunization: Secondary | ICD-10-CM

## 2020-06-22 DIAGNOSIS — Z09 Encounter for follow-up examination after completed treatment for conditions other than malignant neoplasm: Secondary | ICD-10-CM

## 2020-06-22 MED ORDER — HYDROCHLOROTHIAZIDE 25 MG PO TABS: 25.0000 mg | ORAL_TABLET | Freq: Every day | ORAL | 3 refills | Status: DC

## 2020-06-22 MED ORDER — CLONIDINE HCL 0.1 MG PO TABS
0.1000 mg | ORAL_TABLET | Freq: Every day | ORAL | Status: DC
  Administered 2020-06-22: 0.1 mg via ORAL

## 2020-06-22 MED ORDER — AMLODIPINE BESYLATE 10 MG PO TABS: 10.0000 mg | ORAL_TABLET | Freq: Every day | ORAL | 1 refills | Status: DC

## 2020-06-22 NOTE — Patient Instructions (Signed)
Tdap (Tetanus, Diphtheria, Pertussis) Vaccine: What You Need to Know 1. Why get vaccinated? Tdap vaccine can prevent tetanus, diphtheria, and pertussis. Diphtheria and pertussis spread from person to person. Tetanus enters the body through cuts or wounds.  TETANUS (T) causes painful stiffening of the muscles. Tetanus can lead to serious health problems, including being unable to open the mouth, having trouble swallowing and breathing, or death.  DIPHTHERIA (D) can lead to difficulty breathing, heart failure, paralysis, or death.  PERTUSSIS (aP), also known as "whooping cough," can cause uncontrollable, violent coughing that makes it hard to breathe, eat, or drink. Pertussis can be extremely serious especially in babies and young children, causing pneumonia, convulsions, brain damage, or death. In teens and adults, it can cause weight loss, loss of bladder control, passing out, and rib fractures from severe coughing. 2. Tdap vaccine Tdap is only for children 7 years and older, adolescents, and adults.  Adolescents should receive a single dose of Tdap, preferably at age 30 or 45 years. Pregnant people should get a dose of Tdap during every pregnancy, preferably during the early part of the third trimester, to help protect the newborn from pertussis. Infants are most at risk for severe, life-threatening complications from pertussis. Adults who have never received Tdap should get a dose of Tdap. Also, adults should receive a booster dose of either Tdap or Td (a different vaccine that protects against tetanus and diphtheria but not pertussis) every 10 years, or after 5 years in the case of a severe or dirty wound or burn. Tdap may be given at the same time as other vaccines. 3. Talk with your health care provider Tell your vaccine provider if the person getting the vaccine:  Has had an allergic reaction after a previous dose of any vaccine that protects against tetanus, diphtheria, or pertussis, or  has any severe, life-threatening allergies  Has had a coma, decreased level of consciousness, or prolonged seizures within 7 days after a previous dose of any pertussis vaccine (DTP, DTaP, or Tdap)  Has seizures or another nervous system problem  Has ever had Guillain-Barr Syndrome (also called "GBS")  Has had severe pain or swelling after a previous dose of any vaccine that protects against tetanus or diphtheria In some cases, your health care provider may decide to postpone Tdap vaccination until a future visit. People with minor illnesses, such as a cold, may be vaccinated. People who are moderately or severely ill should usually wait until they recover before getting Tdap vaccine.  Your health care provider can give you more information. 4. Risks of a vaccine reaction  Pain, redness, or swelling where the shot was given, mild fever, headache, feeling tired, and nausea, vomiting, diarrhea, or stomachache sometimes happen after Tdap vaccination. People sometimes faint after medical procedures, including vaccination. Tell your provider if you feel dizzy or have vision changes or ringing in the ears.  As with any medicine, there is a very remote chance of a vaccine causing a severe allergic reaction, other serious injury, or death. 5. What if there is a serious problem? An allergic reaction could occur after the vaccinated person leaves the clinic. If you see signs of a severe allergic reaction (hives, swelling of the face and throat, difficulty breathing, a fast heartbeat, dizziness, or weakness), call 9-1-1 and get the person to the nearest hospital. For other signs that concern you, call your health care provider.  Adverse reactions should be reported to the Vaccine Adverse Event Reporting System (VAERS). Your health  care provider will usually file this report, or you can do it yourself. Visit the VAERS website at www.vaers.SamedayNews.es or call 6826263409. VAERS is only for reporting  reactions, and VAERS staff members do not give medical advice. 6. The National Vaccine Injury Compensation Program The [employer] Vaccine Injury Compensation Program (VICP) is a federal program that was created to compensate people who may have been injured by certain vaccines. Claims regarding alleged injury or death due to vaccination have a time limit for filing, which may be as short as two years. Visit the VICP website at GoldCloset.com.ee or call 234-318-5163 to learn about the program and about filing a claim. 7. How can I learn more?  Ask your health care provider.  Call your local or state health department.  Visit the website of the Food and Drug Administration (FDA) for vaccine package inserts and additional information at TraderRating.uy.  Contact the Centers for Disease Control and Prevention (CDC): ? Call (303)218-1805 (1-800-CDC-INFO) or ? Visit CDC's website at http://hunter.com/. Vaccine Information Statement Tdap (Tetanus, Diphtheria, Pertussis) Vaccine (12/05/2019) This information is not intended to replace advice given to you by your health care provider. Make sure you discuss any questions you have with your health care provider. Document Revised: 12/31/2019 Document Reviewed: 12/31/2019 Elsevier Patient Education  2021 [employer].

## 2020-06-22 NOTE — Progress Notes (Signed)
  HPI [name] 70  y.o.male presents for follow up from the hospital.  Presented to ED on  06/04/20, patient was discharged on the same day treated  for: Hypertension, unspecified type and  Chronic daily headache. Prescribed 5mg  of amlodipine. Denies shortness of breath,  chest pain or lower extremity edema. He does have a head ache we discussed elevated Bp and taking medications lowing body needs adjusting.    Past Medical History:  Diagnosis Date  . Hypertension   . Polio      No Known Allergies    Current Outpatient Medications on File Prior to Visit  Medication Sig Dispense Refill  . amLODipine (NORVASC) 5 MG tablet Take 1 tablet (5 mg total) by mouth daily. 30 tablet 0   No current facility-administered medications on file prior to visit.    ROS: all negative except above.   Physical Exam: Filed Weights   06/22/20 0908  Weight: 173 lb 12.8 oz (78.8 kg)   BP (!) 188/132 (BP Location: Right Arm, Patient Position: Sitting, Cuff Size: Normal)   Pulse 65   Temp (!) 97.5 F (36.4 C) (Temporal)   Ht 5\' 11"  (1.803 m)   Wt 173 lb 12.8 oz (78.8 kg)   SpO2 96%   BMI 24.24 kg/m  General Appearance: Well nourished, thin male  in no apparent distress. Eyes: PERRLA, EOMs, conjunctiva no swelling or erythema Sinuses: No Frontal/maxillary tenderness ENT/Mouth: Ext aud canals clear, TMs without erythema, bulging unable to visualize left cerumen impaction  Hearing normal.  Neck: Supple, thyroid normal.  Respiratory: Respiratory effort normal, BS equal bilaterally without rales, rhonchi, wheezing or stridor.  Cardio: RRR with no MRGs. Brisk peripheral pulses without edema.  Abdomen: Soft, + BS.  Non tender, no guarding, rebound, hernias, masses. Lymphatics: Non tender without lymphadenopathy.  Musculoskeletal: Full ROM, 5/5 strength, normal gait.  Skin: Warm, dry without rashes, lesions, ecchymosis.  Neuro: Cranial nerves intact. Normal muscle tone, no cerebellar symptoms.   Psych: Awake and oriented X 3, normal affect, Insight and Judgment appropriate.    Immanuel was seen today for hospitalization follow-up.  Diagnoses and all orders for this visit:  Need for Tdap vaccination -     Tdap vaccine greater than or equal to 7yo IM  Essential hypertension Medication changes increase amlodipine 10mg  and HCTZ 25mg  daily Counseled on blood pressure goal of less than 130/80, low-sodium, DASH diet, medication compliance, 150 minutes of moderate intensity exercise per week. Discussed medication compliance, adverse effects. -     Discontinue: amLODipine (NORVASC) 10 MG tablet; Take 1 tablet (10 mg total) by mouth daily. -     hydrochlorothiazide (HYDRODIURIL) 25 MG tablet; Take 1 tablet (25 mg total) by mouth daily. -     cloNIDine (CATAPRES) tablet 0.1 mg -     amLODipine (NORVASC) 10 MG tablet; Take 1 tablet (10 mg total) by mouth daily.  Hospital discharge follow-up Retrieved from discharge-  1 Schedule an appointment with Joanna on 06/04/2020; Call and make an appointment with the wellness center. They see patients with out insurance. Seen at [facility] 06/22/20 2 Go to [place] (Emergency Medicine) on 06/04/2020; As needed, If symptoms worsen    Encounter to establish care Establish care with PCP  Kerin Perna, NP 9:13 AM

## 2020-06-29 DIAGNOSIS — I639 Cerebral infarction, unspecified: Secondary | ICD-10-CM

## 2020-06-29 HISTORY — DX: Cerebral infarction, unspecified: I63.9

## 2020-07-06 ENCOUNTER — Other Ambulatory Visit: Payer: Self-pay

## 2020-07-06 ENCOUNTER — Encounter (INDEPENDENT_AMBULATORY_CARE_PROVIDER_SITE_OTHER): Payer: Self-pay | Admitting: Primary Care

## 2020-07-06 ENCOUNTER — Ambulatory Visit (INDEPENDENT_AMBULATORY_CARE_PROVIDER_SITE_OTHER): Payer: Self-pay | Admitting: Primary Care

## 2020-07-06 VITALS — BP 160/117 | HR 92 | Temp 97.3°F | Ht 71.0 in | Wt 183.4 lb

## 2020-07-06 DIAGNOSIS — I10 Essential (primary) hypertension: Secondary | ICD-10-CM

## 2020-07-06 DIAGNOSIS — H6123 Impacted cerumen, bilateral: Secondary | ICD-10-CM

## 2020-07-06 NOTE — Patient Instructions (Addendum)
REMEMBER to reduce the amount of coffee and soda you drink daily!!!

## 2020-07-06 NOTE — Progress Notes (Signed)
[facility]   CC:EAR PAIN  SUBJECTIVE: History from: patient.  [name] is a 70 y.o. male who presents with bilateral cerumen impaction and elevated blood pressure. Explained would never be able to control Bp with 8 cups of coffee and 1 liter soda daily. Denies fever, chills, fatigue, sinus pain, rhinorrhea, ear discharge, sore throat, SOB, wheezing, chest pain, nausea, changes in bowel or bladder habits.    ROS: As per HPI.  All other pertinent ROS negative.     Past Medical History:  Diagnosis Date  . Hypertension   . Polio    No past surgical history on file. No Known Allergies Current Outpatient Medications on File Prior to Visit  Medication Sig Dispense Refill  . amLODipine (NORVASC) 10 MG tablet Take 1 tablet (10 mg total) by mouth daily. 90 tablet 1  . hydrochlorothiazide (HYDRODIURIL) 25 MG tablet Take 1 tablet (25 mg total) by mouth daily. 90 tablet 3   Current Facility-Administered Medications on File Prior to Visit  Medication Dose Route Frequency Provider Last Rate Last Admin  . cloNIDine (CATAPRES) tablet 0.1 mg  0.1 mg Oral Daily Kerin Perna, NP   0.1 mg at 06/22/20 8144   Social History   Socioeconomic History  . Marital status: Married    Spouse name: Not on file  . Number of children: Not on file  . Years of education: Not on file  . Highest education level: Not on file  Occupational History  . Not on file  Tobacco Use  . Smoking status: Former Smoker    Years: 30.00    Types: Cigarettes  . Smokeless tobacco: Never Used  . Tobacco comment: quit a mo ago  Substance and Sexual Activity  . Alcohol use: Not Currently    Comment: quit a mo ago  . Drug use: Not Currently    Types: Cocaine  . Sexual activity: Not on file  Other Topics Concern  . Not on file  Social History Narrative  . Not on file   Social Determinants of Health   Financial Resource Strain: Not on file  Food Insecurity: Not on file  Transportation  Needs: Not on file  Physical Activity: Not on file  Stress: Not on file  Social Connections: Not on file  Intimate Partner Violence: Not on file   No family history on file.  OBJECTIVE:  Vitals:   07/06/20 1340 07/06/20 1358  BP: (!) 167/130 (!) 160/117  Pulse: 89 92  Temp: (!) 97.3 F (36.3 C)   TempSrc: Temporal   SpO2: 96%   Weight: 183 lb 6.4 oz (83.2 kg)   Height: 5\' 11"  (1.803 m)      General appearance: alert; appears fatigued HEENT: Ears: EACs clear, TMs pearly gray with visible cone of light, without erythema; Eyes: PERRL, EOMI grossly; Sinuses nontender to palpation; Neck: supple without LAD Lungs: unlabored respirations, symmetrical air entry; cough: no respiratory distress Heart: regular rate and rhythm.  Radial pulses 2+ symmetrical bilaterally Skin: warm and dry Psychological: alert and cooperative; normal mood and affect  Imaging: DG Chest 2 View  Result Date: 06/04/2020 CLINICAL DATA:  Hypertension EXAM: CHEST - 2 VIEW COMPARISON:  None. FINDINGS: Heart size is normal. Atherosclerosis of the aorta with tortuosity. Pulmonary vascularity is normal. The lungs are clear. No effusions. No significant bone finding. IMPRESSION: No active disease. Aortic atherosclerosis and tortuosity. Electronically Signed   By: Nelson Chimes M.D.   On: 06/04/2020 19:47   CT Head Wo Contrast  Result Date: 06/04/2020 CLINICAL DATA:  70 year old male with headache. EXAM: CT HEAD WITHOUT CONTRAST TECHNIQUE: Contiguous axial images were obtained from the base of the skull through the vertex without intravenous contrast. COMPARISON:  None. FINDINGS: Brain: Mild age-related atrophy and moderate chronic microvascular ischemic changes. There is no acute intracranial hemorrhage. No mass effect or midline shift. No extra-axial fluid collection. Vascular: No hyperdense vessel or unexpected calcification. Skull: Normal. Negative for fracture or focal lesion. Sinuses/Orbits: Mild mucoperiosteal  thickening of paranasal sinuses. No air-fluid level. The mastoid air cells are clear. Other: None IMPRESSION: 1. No acute intracranial pathology. 2. Age-related atrophy and chronic microvascular ischemic changes. Electronically Signed   By: Anner Crete M.D.   On: 06/04/2020 20:31     ASSESSMENT & PLAN:  Gavin Dennis was seen today for blood pressure check and cerumen impaction.  Diagnoses and all orders for this visit:  Bilateral impacted cerumen Use a dropper to put olive oil or canola oil in the effected ear- 2-3 times a week. Let it soak for 20-30 min then you can take a shower or use a baby bulb with warm water to wash out the ear wax.  Do not use Qtips. Ear irrigation completed   Essential hypertension BP uncontrolled and the amount of consumption of caffeine can not be managed until he takes steps in to decreasing coffee from 8 cups to possibly 4 a 12 oz of soda. Medication along will not controlled Bp committment and will power can. Return appt when he reduces caffeine intake

## 2020-07-09 ENCOUNTER — Observation Stay (HOSPITAL_COMMUNITY): Payer: Medicare Other

## 2020-07-09 ENCOUNTER — Encounter (HOSPITAL_COMMUNITY): Payer: Self-pay

## 2020-07-09 ENCOUNTER — Emergency Department (HOSPITAL_COMMUNITY): Payer: Medicare Other

## 2020-07-09 ENCOUNTER — Other Ambulatory Visit (HOSPITAL_COMMUNITY): Payer: Self-pay

## 2020-07-09 ENCOUNTER — Ambulatory Visit (INDEPENDENT_AMBULATORY_CARE_PROVIDER_SITE_OTHER): Payer: Self-pay | Admitting: *Deleted

## 2020-07-09 ENCOUNTER — Other Ambulatory Visit: Payer: Self-pay

## 2020-07-09 ENCOUNTER — Inpatient Hospital Stay (HOSPITAL_COMMUNITY)
Admission: EM | Admit: 2020-07-09 | Discharge: 2020-07-11 | DRG: 064 | Disposition: A | Discharge status: Home / Self Care | Payer: Medicare Other | Attending: Family Medicine | Admitting: Family Medicine

## 2020-07-09 DIAGNOSIS — I6389 Other cerebral infarction: Principal | ICD-10-CM | POA: Diagnosis present

## 2020-07-09 DIAGNOSIS — N179 Acute kidney failure, unspecified: Secondary | ICD-10-CM | POA: Diagnosis present

## 2020-07-09 DIAGNOSIS — R296 Repeated falls: Secondary | ICD-10-CM | POA: Diagnosis present

## 2020-07-09 DIAGNOSIS — I10 Essential (primary) hypertension: Secondary | ICD-10-CM | POA: Diagnosis present

## 2020-07-09 DIAGNOSIS — K5900 Constipation, unspecified: Secondary | ICD-10-CM | POA: Diagnosis present

## 2020-07-09 DIAGNOSIS — M48061 Spinal stenosis, lumbar region without neurogenic claudication: Secondary | ICD-10-CM | POA: Diagnosis present

## 2020-07-09 DIAGNOSIS — E1142 Type 2 diabetes mellitus with diabetic polyneuropathy: Secondary | ICD-10-CM | POA: Diagnosis present

## 2020-07-09 DIAGNOSIS — Z79899 Other long term (current) drug therapy: Secondary | ICD-10-CM

## 2020-07-09 DIAGNOSIS — Z8673 Personal history of transient ischemic attack (TIA), and cerebral infarction without residual deficits: Secondary | ICD-10-CM | POA: Diagnosis present

## 2020-07-09 DIAGNOSIS — U071 COVID-19: Secondary | ICD-10-CM | POA: Diagnosis present

## 2020-07-09 DIAGNOSIS — Z87891 Personal history of nicotine dependence: Secondary | ICD-10-CM

## 2020-07-09 DIAGNOSIS — R32 Unspecified urinary incontinence: Secondary | ICD-10-CM | POA: Diagnosis present

## 2020-07-09 DIAGNOSIS — K7010 Alcoholic hepatitis without ascites: Secondary | ICD-10-CM | POA: Diagnosis present

## 2020-07-09 DIAGNOSIS — H539 Unspecified visual disturbance: Principal | ICD-10-CM

## 2020-07-09 DIAGNOSIS — I639 Cerebral infarction, unspecified: Secondary | ICD-10-CM | POA: Diagnosis present

## 2020-07-09 DIAGNOSIS — H532 Diplopia: Secondary | ICD-10-CM | POA: Diagnosis present

## 2020-07-09 DIAGNOSIS — E785 Hyperlipidemia, unspecified: Secondary | ICD-10-CM | POA: Diagnosis present

## 2020-07-09 DIAGNOSIS — H02402 Unspecified ptosis of left eyelid: Secondary | ICD-10-CM | POA: Diagnosis present

## 2020-07-09 DIAGNOSIS — Z8612 Personal history of poliomyelitis: Secondary | ICD-10-CM

## 2020-07-09 DIAGNOSIS — E538 Deficiency of other specified B group vitamins: Secondary | ICD-10-CM | POA: Diagnosis present

## 2020-07-09 DIAGNOSIS — I6300 Cerebral infarction due to thrombosis of unspecified precerebral artery: Secondary | ICD-10-CM

## 2020-07-09 DIAGNOSIS — M5412 Radiculopathy, cervical region: Secondary | ICD-10-CM | POA: Diagnosis present

## 2020-07-09 DIAGNOSIS — M4802 Spinal stenosis, cervical region: Secondary | ICD-10-CM | POA: Diagnosis present

## 2020-07-09 LAB — CBC
HCT: 47.6 % (ref 39.0–52.0)
HCT: 47 % (ref 39.0–52.0)
Hemoglobin: 16.3 g/dL (ref 13.0–17.0)
Hemoglobin: 16 g/dL (ref 13.0–17.0)
MCH: 31.5 pg (ref 26.0–34.0)
MCH: 31.1 pg (ref 26.0–34.0)
MCHC: 34.2 g/dL (ref 30.0–36.0)
MCHC: 34 g/dL (ref 30.0–36.0)
MCV: 91.9 fL (ref 80.0–100.0)
MCV: 91.4 fL (ref 80.0–100.0)
Platelets: 229 10*3/uL (ref 150–400)
Platelets: 215 10*3/uL (ref 150–400)
RBC: 5.18 MIL/uL (ref 4.22–5.81)
RBC: 5.14 MIL/uL (ref 4.22–5.81)
RDW: 13.2 % (ref 11.5–15.5)
RDW: 13 % (ref 11.5–15.5)
WBC: 5.5 10*3/uL (ref 4.0–10.5)
WBC: 6.1 10*3/uL (ref 4.0–10.5)
nRBC: 0 % (ref 0.0–0.2)
nRBC: 0 % (ref 0.0–0.2)

## 2020-07-09 LAB — COMPREHENSIVE METABOLIC PANEL
ALT: 94 U/L — ABNORMAL HIGH (ref 0–44)
ALT: 83 U/L — ABNORMAL HIGH (ref 0–44)
AST: 46 U/L — ABNORMAL HIGH (ref 15–41)
AST: 37 U/L (ref 15–41)
Albumin: 3.5 g/dL (ref 3.5–5.0)
Albumin: 3.3 g/dL — ABNORMAL LOW (ref 3.5–5.0)
Alkaline Phosphatase: 112 U/L (ref 38–126)
Alkaline Phosphatase: 109 U/L (ref 38–126)
Anion gap: 5 (ref 5–15)
Anion gap: 6 (ref 5–15)
BUN: 25 mg/dL — ABNORMAL HIGH (ref 8–23)
BUN: 23 mg/dL (ref 8–23)
CO2: 27 mmol/L (ref 22–32)
CO2: 27 mmol/L (ref 22–32)
Calcium: 10.2 mg/dL (ref 8.9–10.3)
Calcium: 10.2 mg/dL (ref 8.9–10.3)
Chloride: 106 mmol/L (ref 98–111)
Chloride: 105 mmol/L (ref 98–111)
Creatinine, Ser: 1.38 mg/dL — ABNORMAL HIGH (ref 0.61–1.24)
Creatinine, Ser: 1.15 mg/dL (ref 0.61–1.24)
GFR, Estimated: 56 mL/min — ABNORMAL LOW (ref >60)
GFR, Estimated: >60 mL/min (ref >60)
Glucose, Bld: 157 mg/dL — ABNORMAL HIGH (ref 70–99)
Glucose, Bld: 98 mg/dL (ref 70–99)
Potassium: 3.8 mmol/L (ref 3.5–5.1)
Potassium: 3.8 mmol/L (ref 3.5–5.1)
Sodium: 138 mmol/L (ref 135–145)
Sodium: 138 mmol/L (ref 135–145)
Total Bilirubin: 0.9 mg/dL (ref 0.3–1.2)
Total Bilirubin: 0.9 mg/dL (ref 0.3–1.2)
Total Protein: 7.1 g/dL (ref 6.5–8.1)
Total Protein: 6.8 g/dL (ref 6.5–8.1)

## 2020-07-09 LAB — DIFFERENTIAL
Abs Immature Granulocytes: 0.02 10*3/uL (ref 0.00–0.07)
Basophils Absolute: 0 10*3/uL (ref 0.0–0.1)
Basophils Relative: 1 %
Eosinophils Absolute: 0.1 10*3/uL (ref 0.0–0.5)
Eosinophils Relative: 2 %
Immature Granulocytes: 0 %
Lymphocytes Relative: 36 %
Lymphs Abs: 2 10*3/uL (ref 0.7–4.0)
Monocytes Absolute: 0.8 10*3/uL (ref 0.1–1.0)
Monocytes Relative: 15 %
Neutro Abs: 2.5 10*3/uL (ref 1.7–7.7)
Neutrophils Relative %: 46 %

## 2020-07-09 LAB — FOLATE: Folate: 12.8 ng/mL (ref >5.9)

## 2020-07-09 LAB — PROTIME-INR
INR: 1 (ref 0.8–1.2)
Prothrombin Time: 12.8 seconds (ref 11.4–15.2)

## 2020-07-09 LAB — VITAMIN B12: Vitamin B-12: 142 pg/mL — ABNORMAL LOW (ref 180–914)

## 2020-07-09 LAB — PHOSPHORUS: Phosphorus: 3.3 mg/dL (ref 2.5–4.6)

## 2020-07-09 LAB — ECHOCARDIOGRAM COMPLETE
Area-P 1/2: 2.95 cm2
S' Lateral: 3.2 cm

## 2020-07-09 LAB — SARS CORONAVIRUS 2 (TAT 6-24 HRS): SARS Coronavirus 2: POSITIVE — AB

## 2020-07-09 LAB — HIV ANTIBODY (ROUTINE TESTING W REFLEX): HIV Screen 4th Generation wRfx: NONREACTIVE

## 2020-07-09 LAB — MAGNESIUM: Magnesium: 1.6 mg/dL — ABNORMAL LOW (ref 1.7–2.4)

## 2020-07-09 LAB — APTT: aPTT: 30 seconds (ref 24–36)

## 2020-07-09 IMAGING — MR MR HEAD W/O CM
8 of 10 series · 35 of 48 positions shown · non-contrast
Comparison: Head CT [DATE]

CLINICAL DATA: Blurred vision.  Double vision.  Hypertension.

EXAM:
MRI HEAD WITHOUT CONTRAST
TECHNIQUE: Multiplanar, multiecho pulse sequences of the brain and surrounding
structures were obtained without intravenous contrast.

[Series 3: DWI · axial · 3.0mm · 1.09mm/px · z∈[-44,+88]mm · 9 of 90 slices shown (1 of 4)]
[im 1/90]
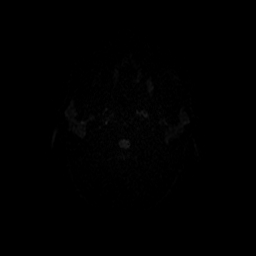
[im 12/90]
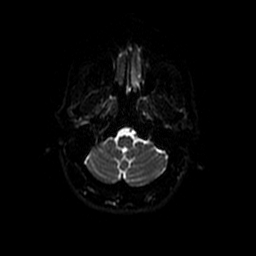
[im 23/90]
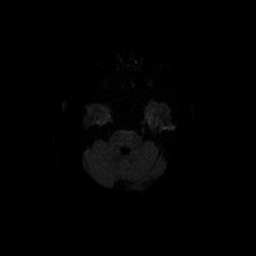
[im 34/90]
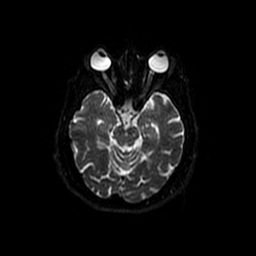
[im 45/90]
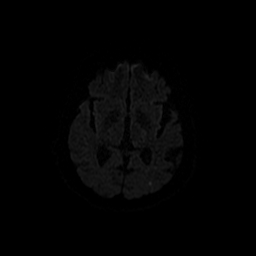
[im 56/90]
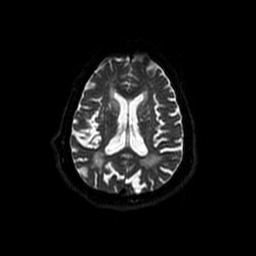
[im 67/90]
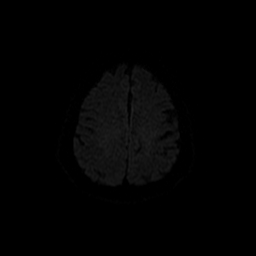
[im 78/90]
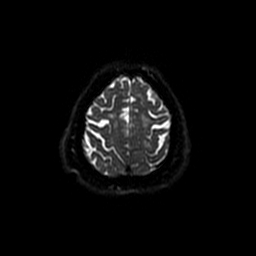
[im 90/90]
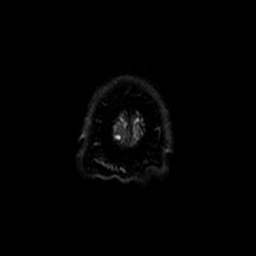

[Series 4: DWI · coronal · 5.0mm · 1.09mm/px · 6 of 66 slices shown (2 of 4)]
[im 1/66]
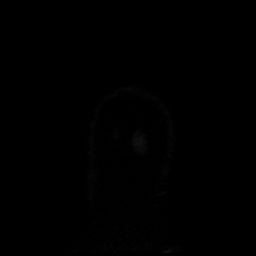
[im 14/66]
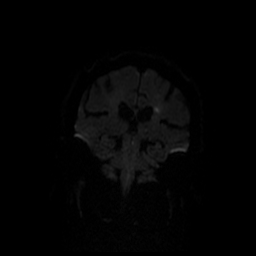
[im 27/66]
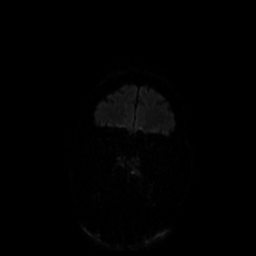
[im 40/66]
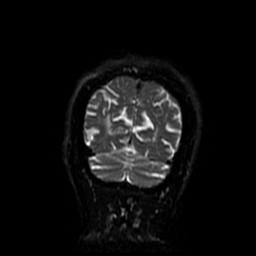
[im 53/66]
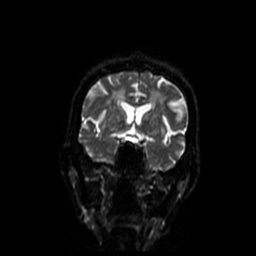
[im 66/66]
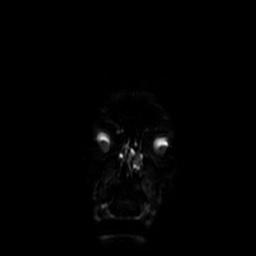

[Series 5: T1 · sagittal · 5.0mm · 0.47mm/px · 2 of 23 slices shown]
[im 1/23]
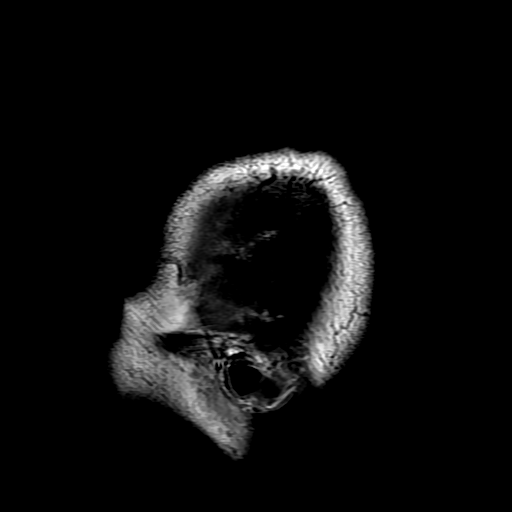
[im 23/23]
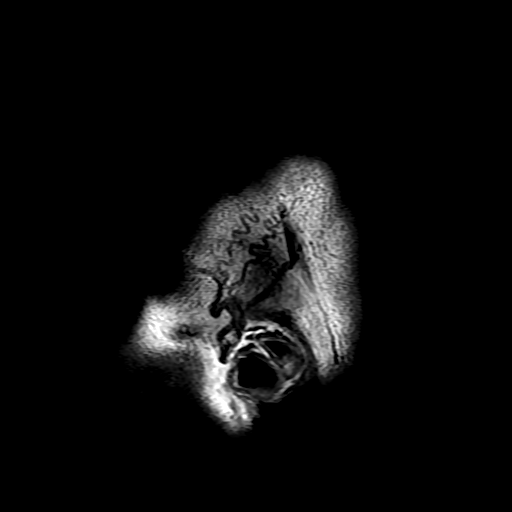

[Series 6: T2 · axial · 5.0mm · 0.43mm/px · z∈[-53,+85]mm · 3 of 24 slices shown (1 of 2)]
[im 1/24]
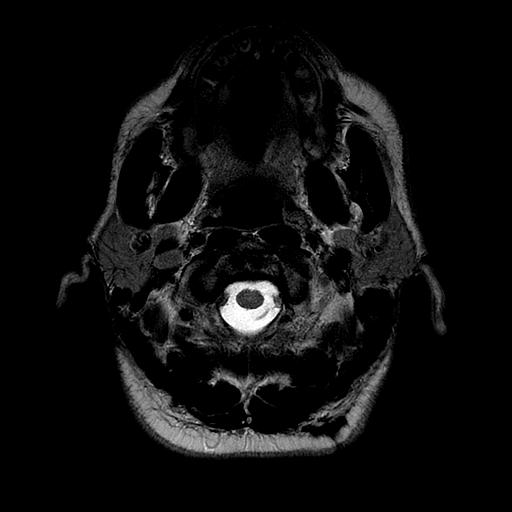
[im 12/24]
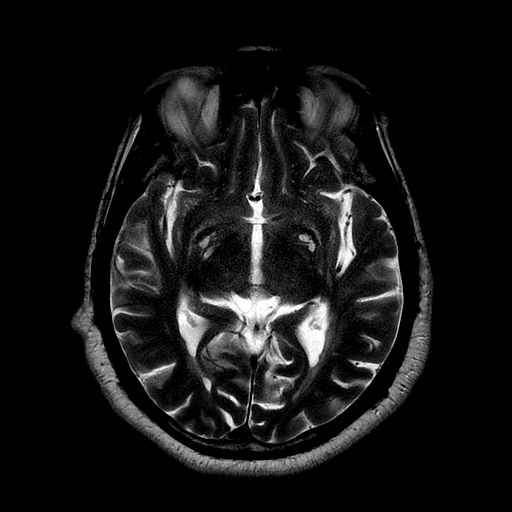
[im 24/24]
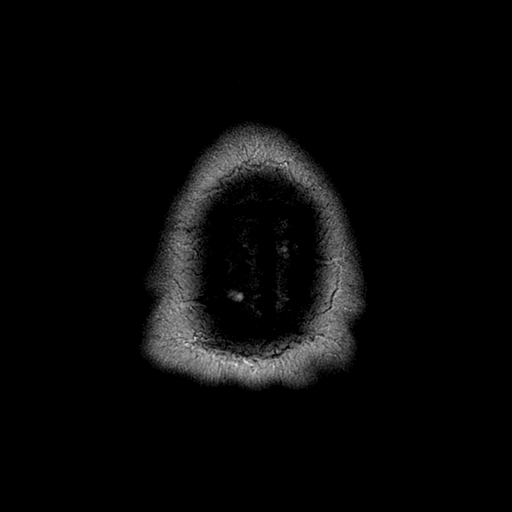

[Series 7: FLAIR · axial · 3.0mm · 0.43mm/px · z∈[-53,+85]mm · 3 of 24 slices shown]
[im 1/24]
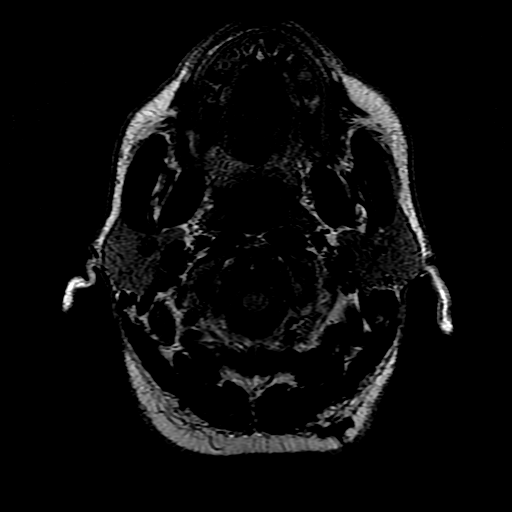
[im 12/24]
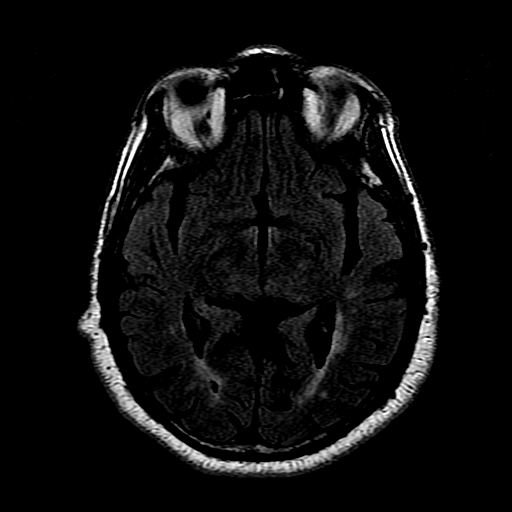
[im 24/24]
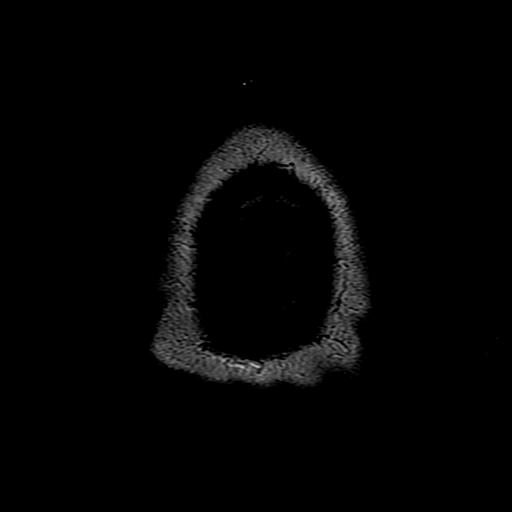

[Series 10: T2 · coronal · 5.0mm · 0.39mm/px · 3 of 25 slices shown (2 of 2)]
[im 1/25]
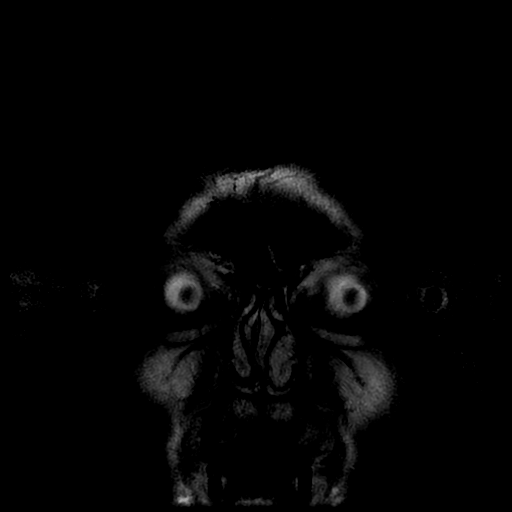
[im 13/25]
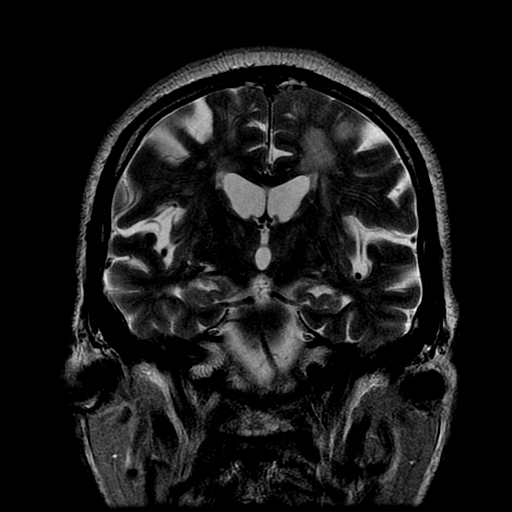
[im 25/25]
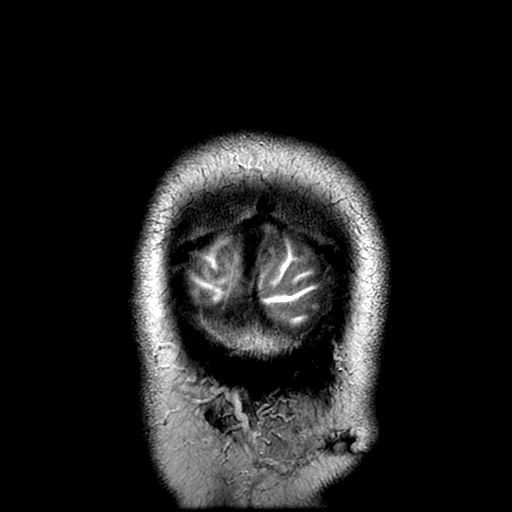

[Series 300: DWI · axial · 3.0mm · 1.09mm/px · z∈[-44,+88]mm · 5 of 45 slices shown (3 of 4)]
[im 1/45]
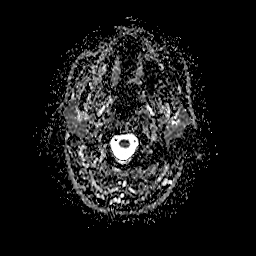
[im 12/45]
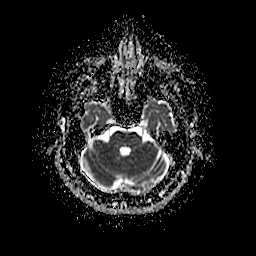
[im 23/45]
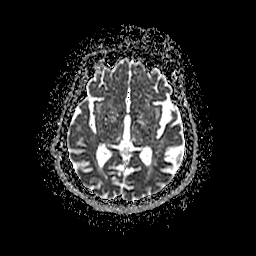
[im 34/45]
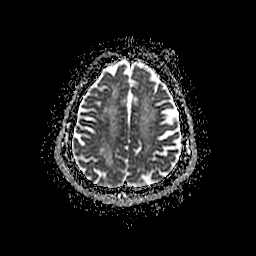
[im 45/45]
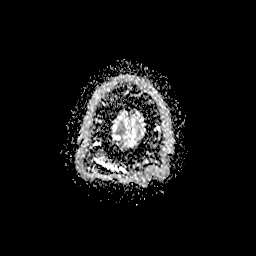

[Series 400: DWI · coronal · 5.0mm · 1.09mm/px · 4 of 33 slices shown (4 of 4)]
[im 1/33]
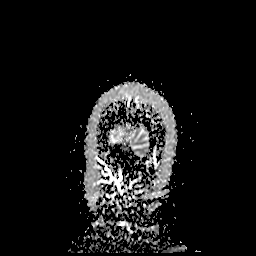
[im 11/33]
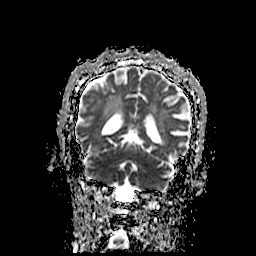
[im 22/33]
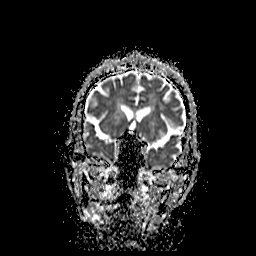
[im 33/33]
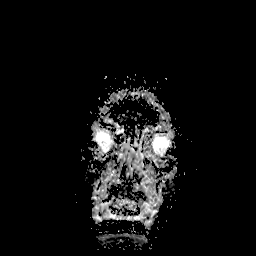

[35 of 48 positions shown; findings below may reference images not displayed]

FINDINGS: Brain: There are chronic small-vessel ischemic changes of the pons
and there are old small vessel cerebellar infarctions. Question
punctate acute infarction in the left paramedian mid brain.

Single punctate acute infarction in the right parietooccipital
junction region. Second punctate acute infarction at the right
posterior frontal vertex. In the left hemisphere, there are more
numerous punctate acute infarctions including 2 or 3 at the
temporal/parietal/occipital junction and 1 within the deep radiating
white matter tracts. That could potentially be a tiny cortical
infarction in the left frontal lobe. This pattern of small acute
infarctions suggests embolic disease from the heart or ascending
aorta. No large vessel territory stroke. Cerebral hemispheres
otherwise show moderate chronic small-vessel disease of the thalami,
basal ganglia and hemispheric white matter.

Vascular: Major vessels at the base of the brain show flow.

Skull and upper cervical spine: Negative

Sinuses/Orbits: Clear/normal

Other: None
IMPRESSION: 1. Background pattern of chronic small-vessel ischemic changes
throughout the brain.
2. Numerous (5-7) punctate acute infarctions in both cerebral
hemispheres consistent with micro embolic infarctions from the heart
or ascending aorta. No large confluent infarction.

## 2020-07-09 IMAGING — MR MR CERVICAL SPINE W/O CM
5 series · 37 of 48 positions shown · non-contrast
Comparison: Soft tissue neck CT [DATE]

CLINICAL DATA: Left foot tingling, urinary urgency, and incomplete
bladder imaging with positive SAMSONAITE sign.

EXAM:
MRI CERVICAL SPINE WITHOUT CONTRAST
TECHNIQUE: Multiplanar, multisequence MR imaging of the cervical spine was
performed. No intravenous contrast was administered.

[Series 5: T2 · sagittal · 3.0mm · 0.69mm/px · 6 of 15 slices shown (1 of 2)]
[im 1/15]
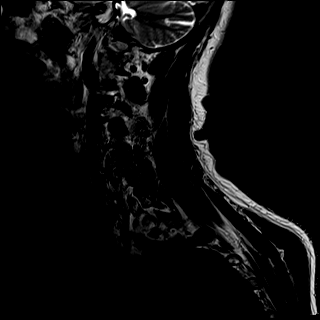
[im 3/15]
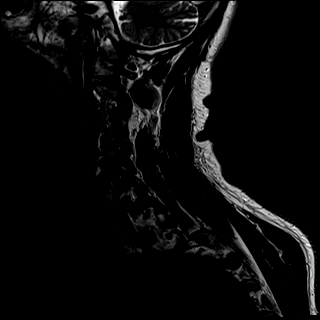
[im 6/15]
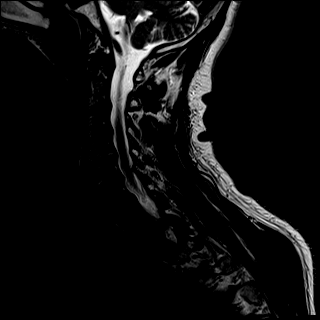
[im 9/15]
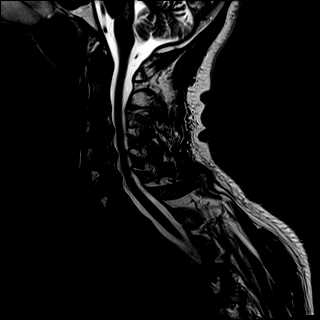
[im 12/15]
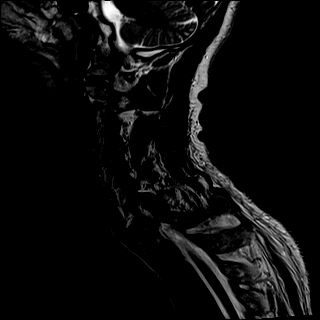
[im 15/15]
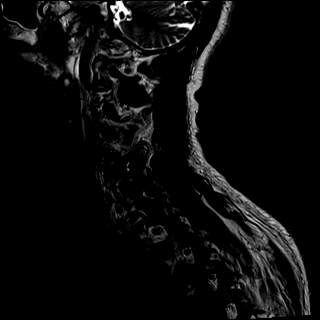

[Series 6: T1 · sagittal · 3.0mm · 0.69mm/px · 6 of 15 slices shown]
[im 1/15]
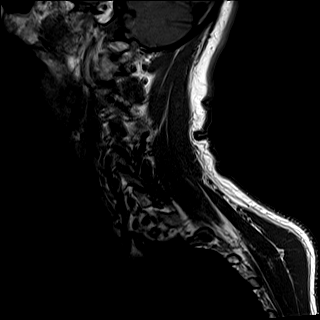
[im 3/15]
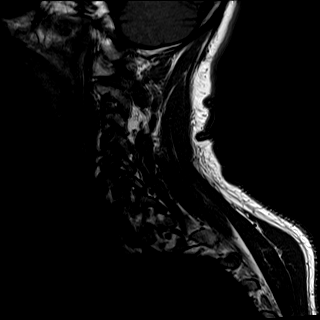
[im 6/15]
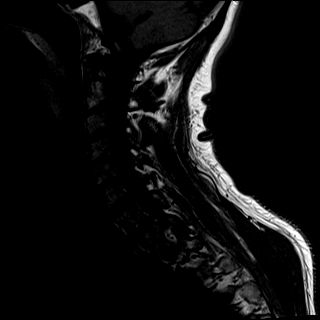
[im 9/15]
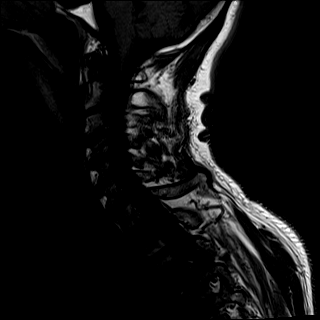
[im 12/15]
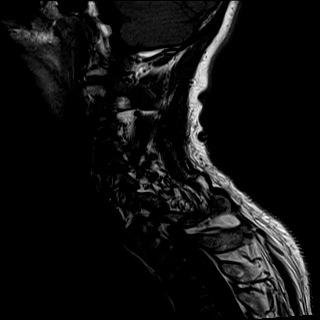
[im 15/15]
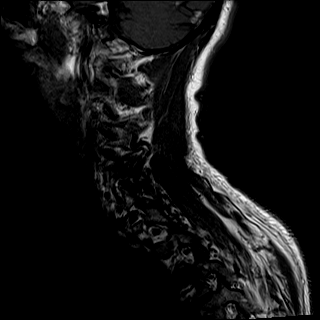

[Series 7: STIR · sagittal · 3.0mm · 0.86mm/px · 6 of 15 slices shown]
[im 1/15]
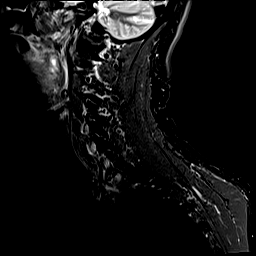
[im 3/15]
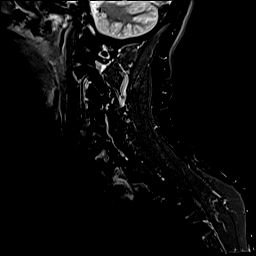
[im 6/15]
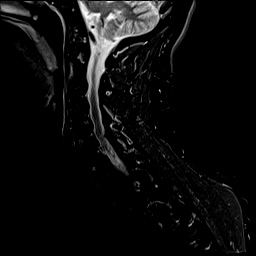
[im 9/15]
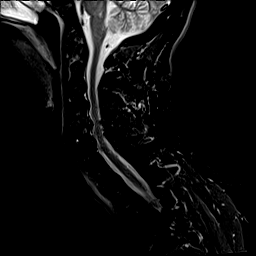
[im 12/15]
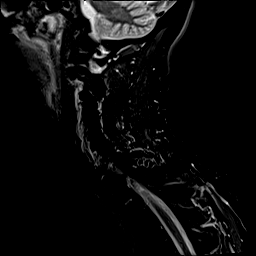
[im 15/15]
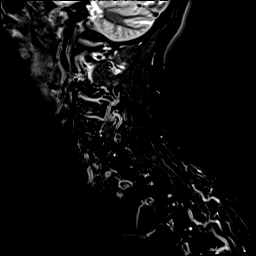

[Series 8: T2 · axial · 3.0mm · 0.66mm/px · z∈[-144,-26]mm · 11 of 35 slices shown (2 of 2)]
[im 1/35]
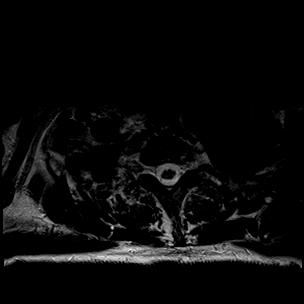
[im 3/35]
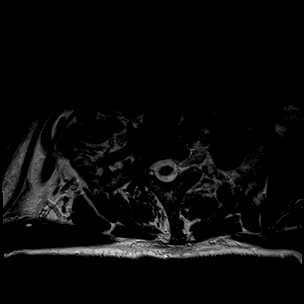
[im 6/35]
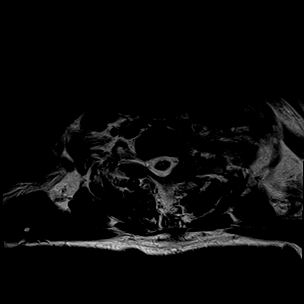
[im 8/35]
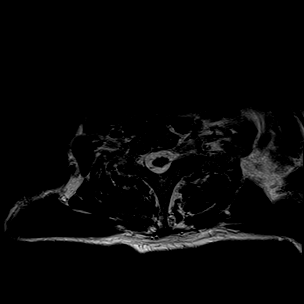
[im 11/35]
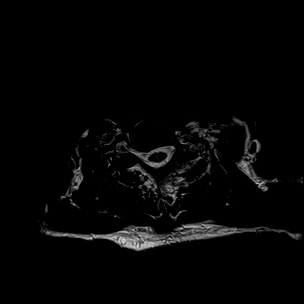
[im 14/35]
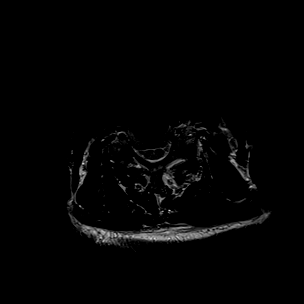
[im 16/35]
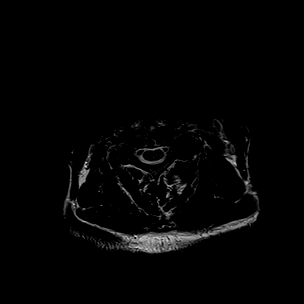
[im 19/35]
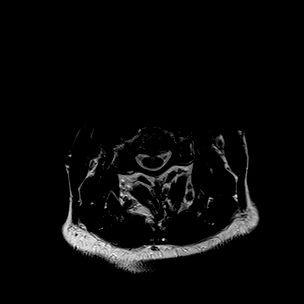
[im 24/35]
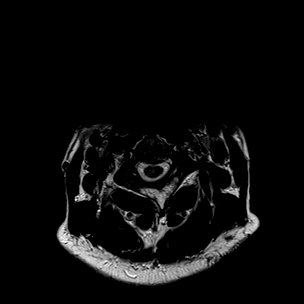
[im 29/35]
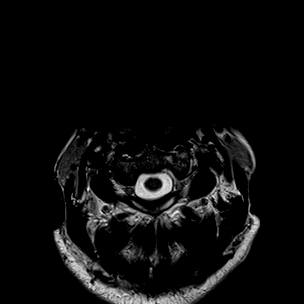
[im 35/35]
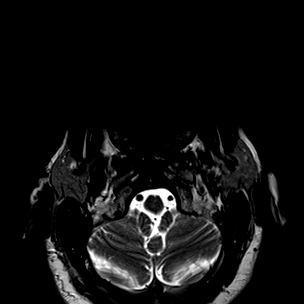

[Series 9: GRE · axial · 3.0mm · 0.39mm/px · z∈[-138,-26]mm · 8 of 40 slices shown]
[im 3/40]
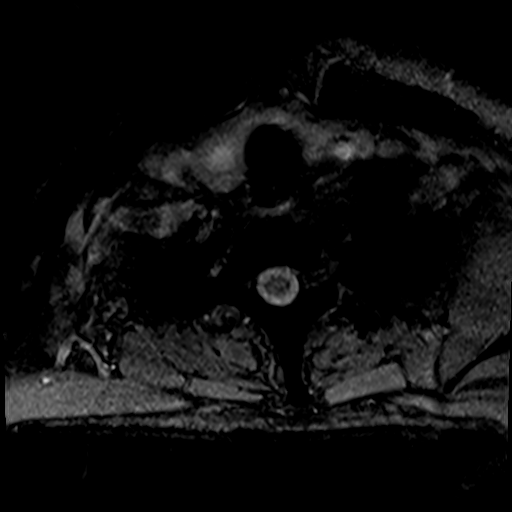
[im 8/40]
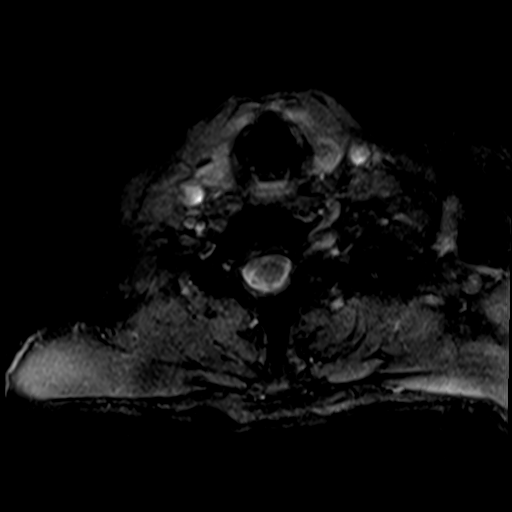
[im 14/40]
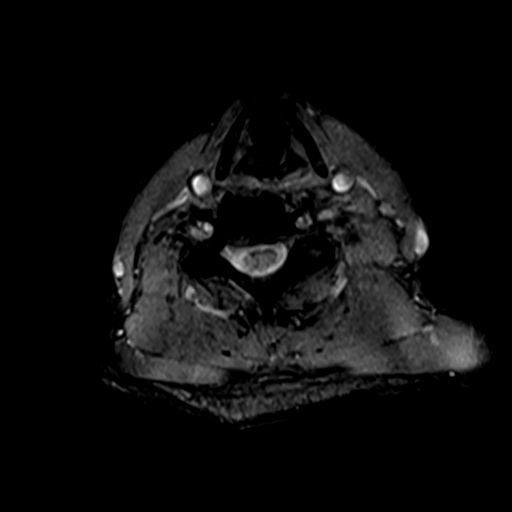
[im 19/40]
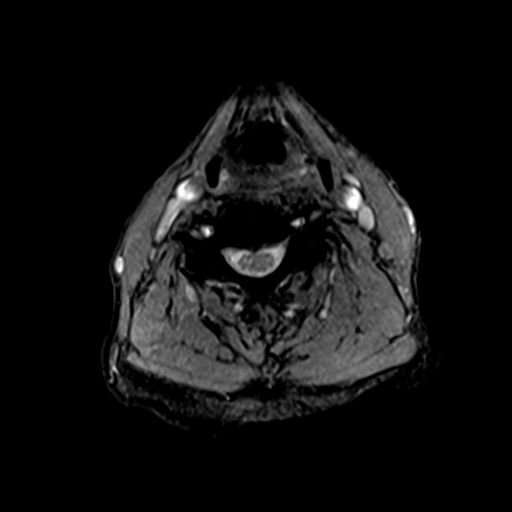
[im 24/40]
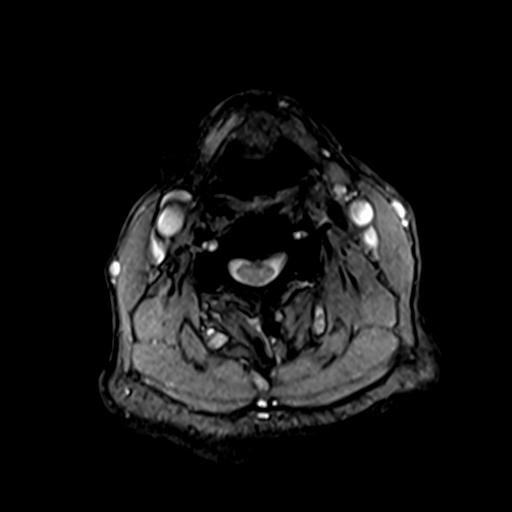
[im 29/40]
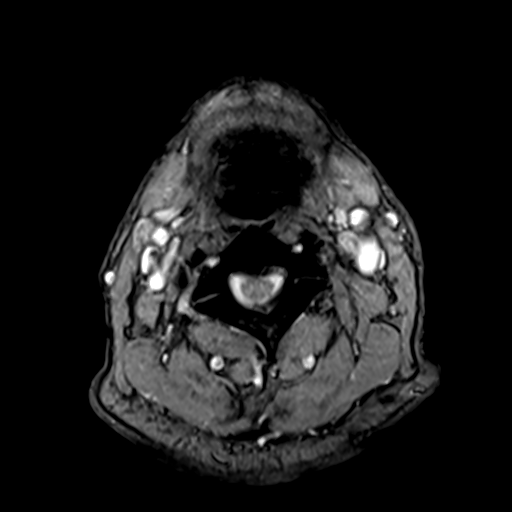
[im 34/40]
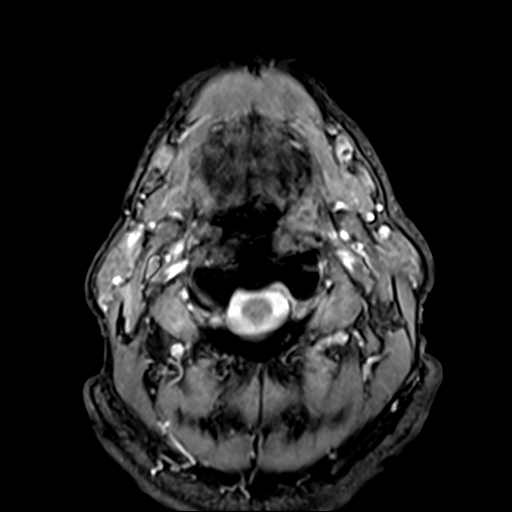
[im 40/40]
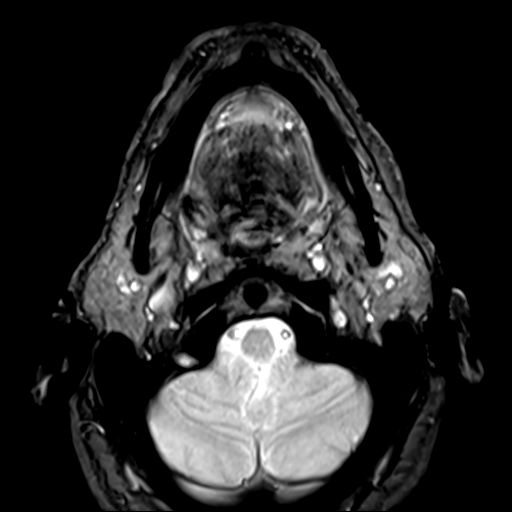

[37 of 48 positions shown; findings below may reference images not displayed]

FINDINGS: Alignment: Normal.

Vertebrae: No fracture or suspicious osseous lesion. Degenerative
endplate changes at C4-5 and C5-6 including mild edema.

Cord: Normal signal and morphology.

Posterior Fossa, vertebral arteries, paraspinal tissues:
Unremarkable.

Disc levels:

C2-3: Mild disc bulging and left uncovertebral spurring result in
mild left neural foraminal stenosis without spinal stenosis.

C3-4: Disc bulging, a central disc protrusion, left uncovertebral
spurring, and mild facet arthrosis result in mild spinal stenosis
and moderate to severe left neural foraminal stenosis.

C4-5: Mild disc space narrowing. Disc bulging, uncovertebral
spurring, and mild facet arthrosis result in mild spinal stenosis
and mild right and severe left neural foraminal stenosis.

C5-6: Moderate disc space narrowing. Disc bulging and asymmetric
right uncovertebral spurring result in mild spinal stenosis and
severe right and moderate left neural foraminal stenosis.

C6-7: A shallow right paracentral to right foraminal disc protrusion
results in mild right neural foraminal stenosis without spinal
stenosis.

C7-T1: Minimal disc bulging and moderate left facet arthrosis
without stenosis.
IMPRESSION: 1. Multilevel cervical disc degeneration resulting in mild spinal
stenosis from C3-4 to C5-6.
2. Severe multilevel neural foraminal stenosis as above.

## 2020-07-09 IMAGING — MR MR THORACIC SPINE W/O CM
4 of 5 series · 22 of 48 positions shown · non-contrast
Comparison: None.

CLINICAL DATA: Left foot tingling, urinary urgency, and incomplete
bladder imaging with positive DEQUAN sign.

EXAM:
MRI THORACIC SPINE WITHOUT CONTRAST
TECHNIQUE: Multiplanar, multisequence MR imaging of the thoracic spine was
performed. No intravenous contrast was administered.

[Series 15: T2 · sagittal · 3.3mm · 0.76mm/px · 6 of 19 slices shown (1 of 2)]
[im 1/19]
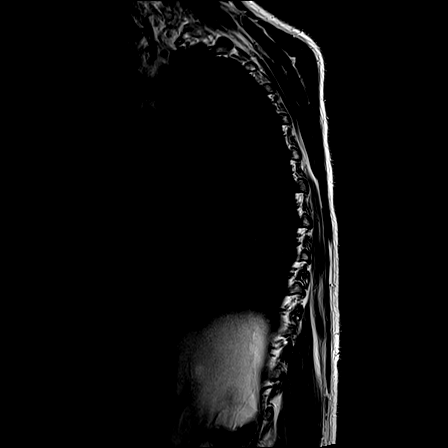
[im 4/19]
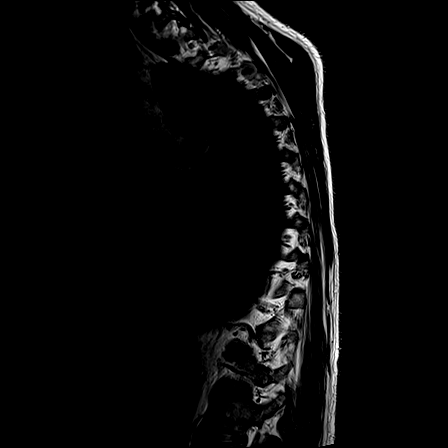
[im 8/19]
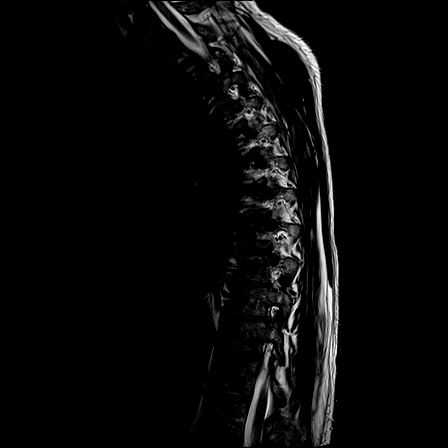
[im 11/19]
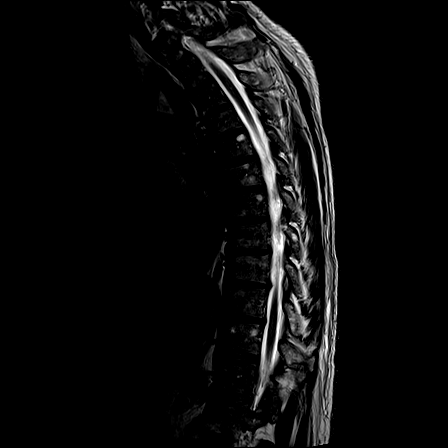
[im 15/19]
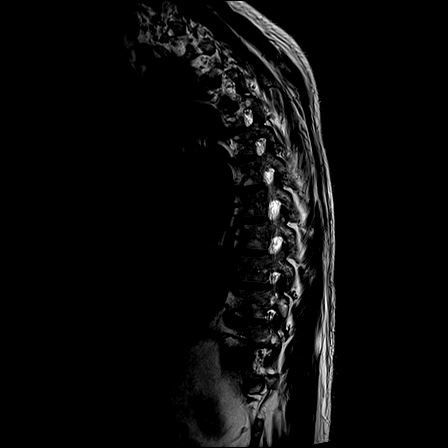
[im 19/19]
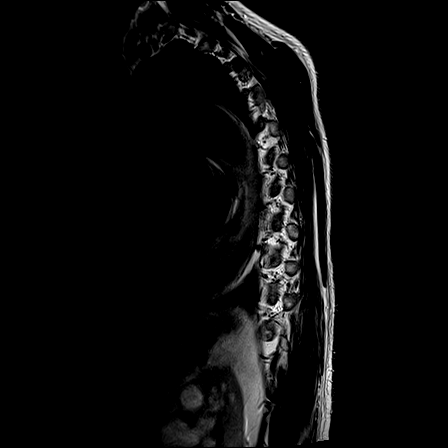

[Series 16: T1 · sagittal · 3.3mm · 0.76mm/px · 5 of 19 slices shown]
[im 1/19]
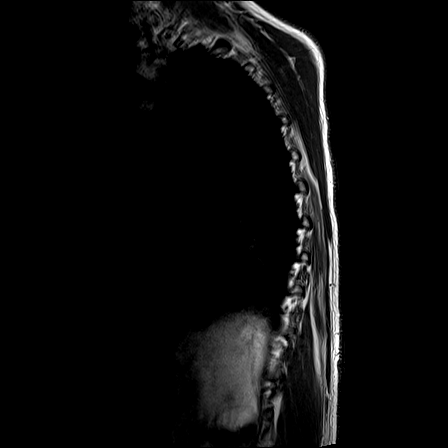
[im 4/19]
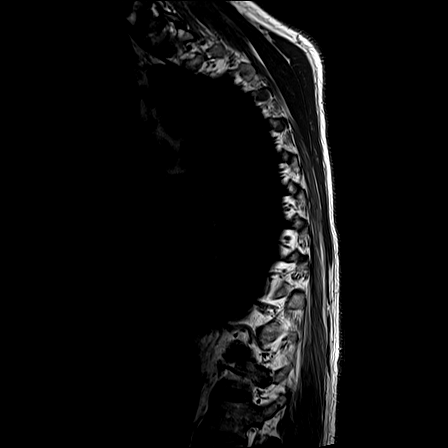
[im 7/19]
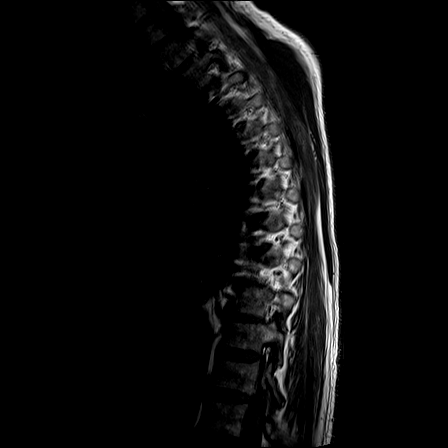
[im 10/19]
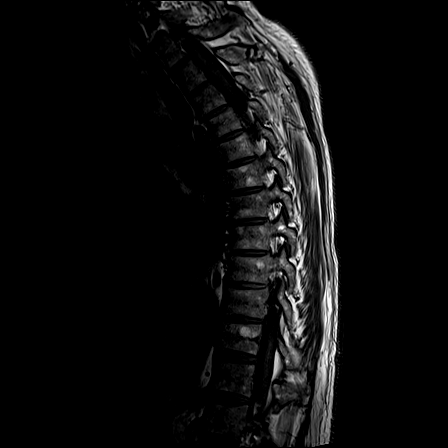
[im 16/19]
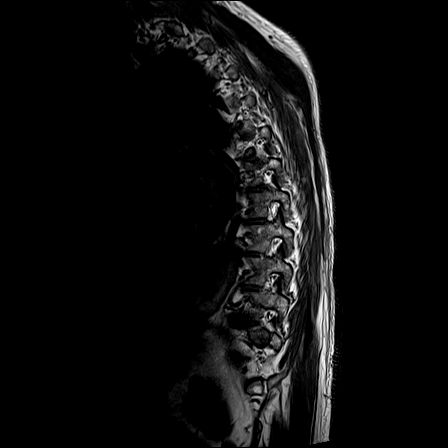

[Series 17: STIR · sagittal · 3.3mm · 0.38mm/px · 3 of 19 slices shown]
[im 4/19]
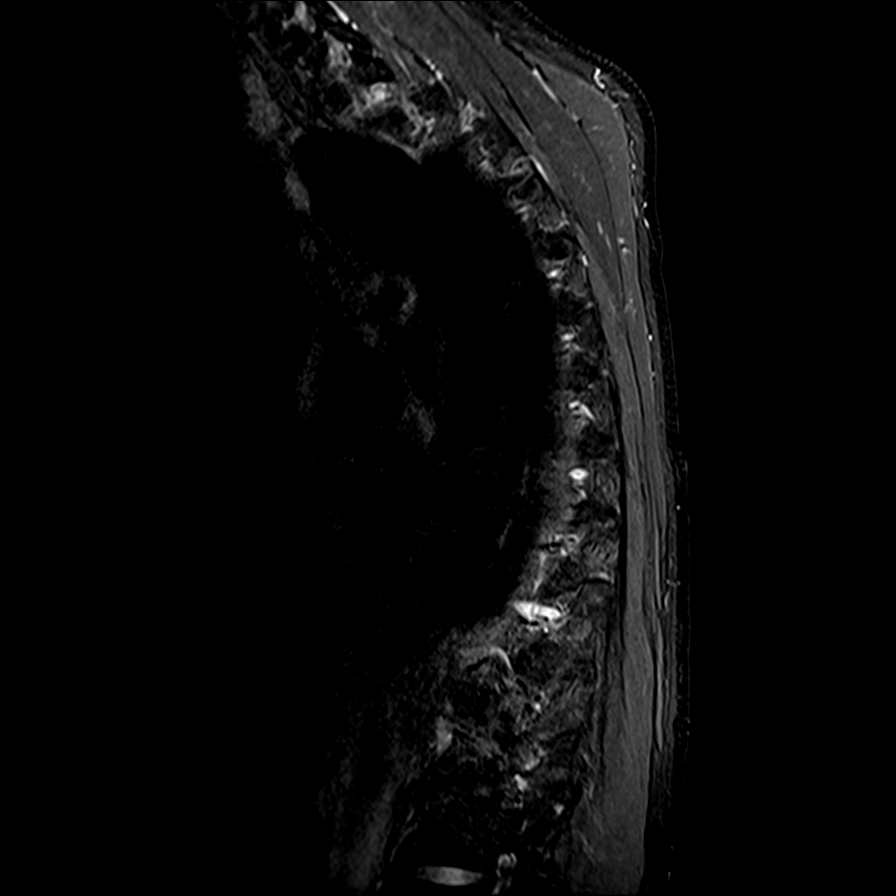
[im 10/19]
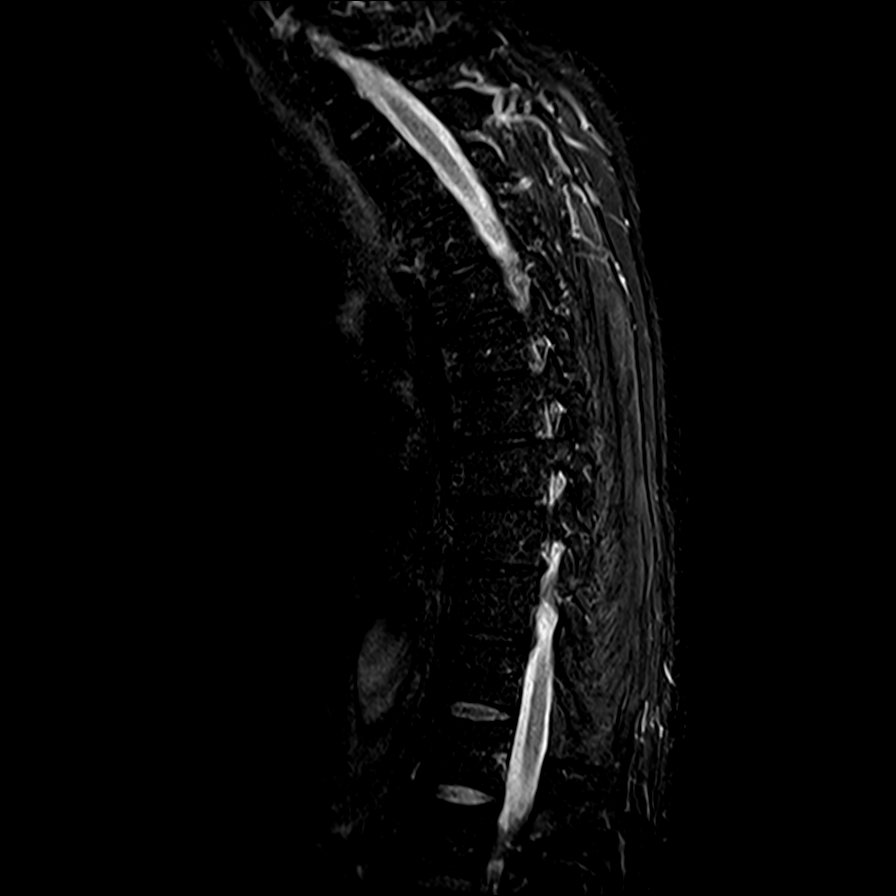
[im 16/19]
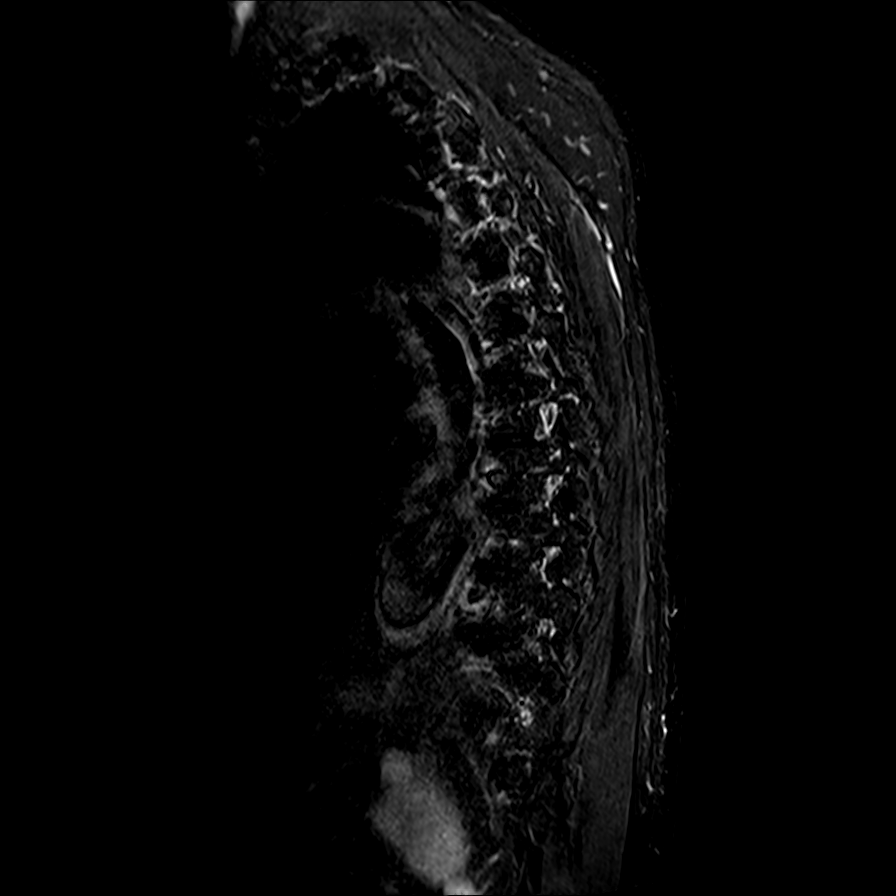

[Series 18: T2 · axial · 4.0mm · 0.59mm/px · z∈[-134,+67]mm · 8 of 39 slices shown (2 of 2)]
[im 1/39]
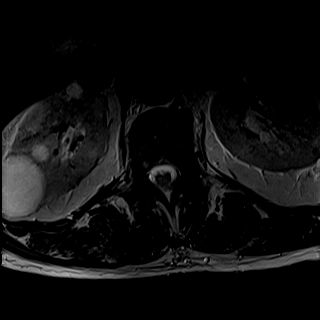
[im 6/39]
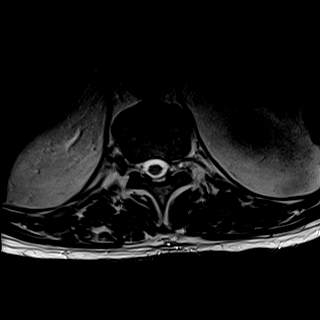
[im 12/39]
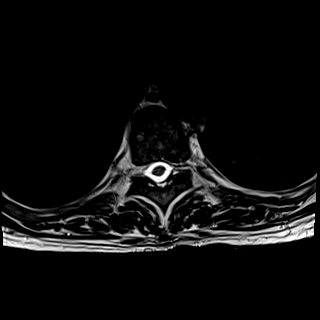
[im 18/39]
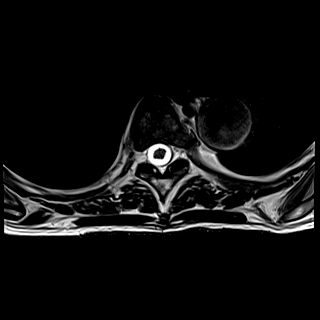
[im 21/39]
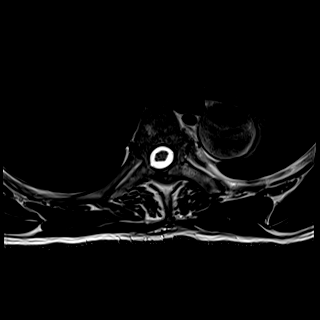
[im 27/39]
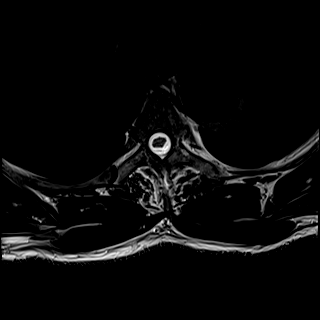
[im 33/39]
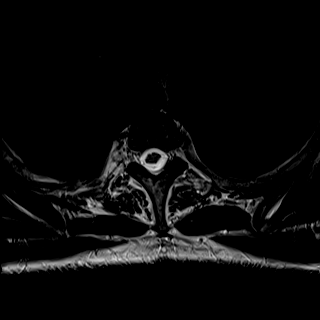
[im 39/39]
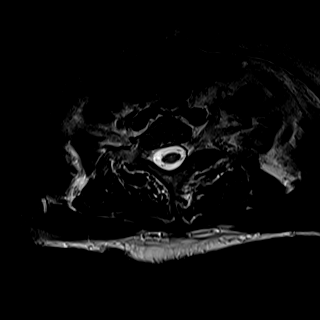

[22 of 48 positions shown; findings below may reference images not displayed]

FINDINGS: Alignment:  Normal.

Vertebrae: No fracture, suspicious osseous lesion, or significant
marrow edema. Scattered small vertebral hemangiomas.

Cord:  Normal signal and morphology.

Paraspinal and other soft tissues: Subcentimeter right thyroid
nodules for which no imaging follow-up is recommended.

Disc levels:

Mild disc bulging in significant stenosis. At T9-10 does not result
disc bulging and mild posterior element hypertrophy at T10-11
results in mild spinal stenosis without cord compression. The neural
foramina appear patent throughout.
IMPRESSION: 1. Mild lower thoracic spondylosis with mild spinal stenosis at
T10-11.
2. Normal appearance of the thoracic spinal cord.

## 2020-07-09 IMAGING — MR MR LUMBAR SPINE W/O CM
4 of 5 series · 26 of 48 positions shown · non-contrast
Comparison: None.

CLINICAL DATA: Left foot tingling, urinary urgency, and incomplete
bladder imaging with positive VAFLOR sign.

EXAM:
MRI LUMBAR SPINE WITHOUT CONTRAST
TECHNIQUE: Multiplanar, multisequence MR imaging of the lumbar spine was
performed. No intravenous contrast was administered.

[Series 5: T2 · sagittal · 4.0mm · 0.73mm/px · 7 of 17 slices shown (1 of 2)]
[im 1/17]
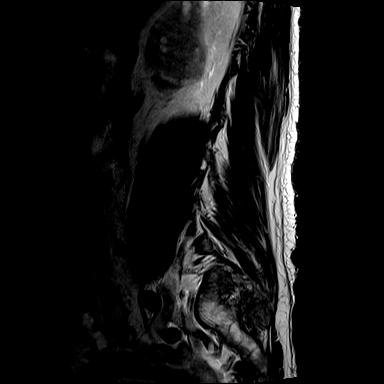
[im 3/17]
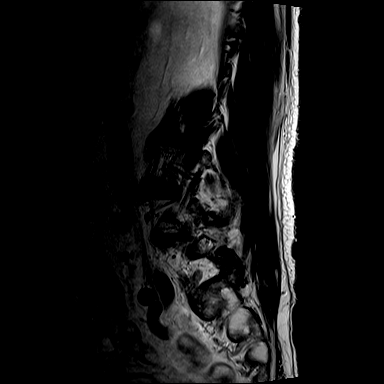
[im 6/17]
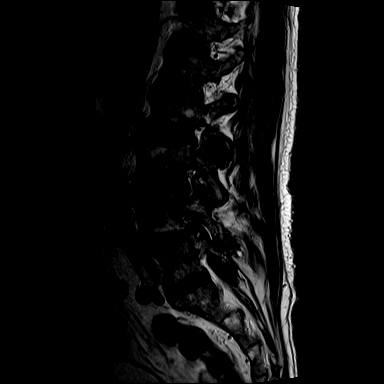
[im 9/17]
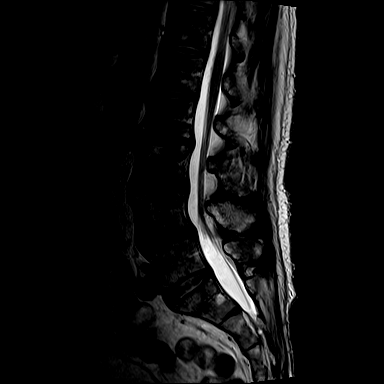
[im 11/17]
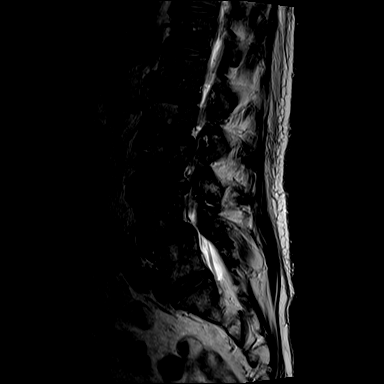
[im 14/17]
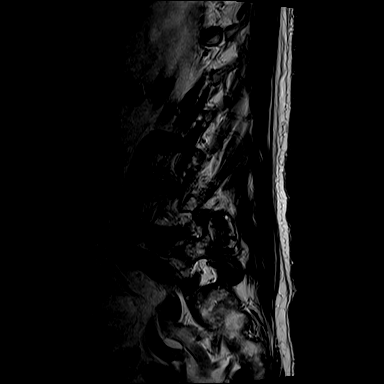
[im 17/17]
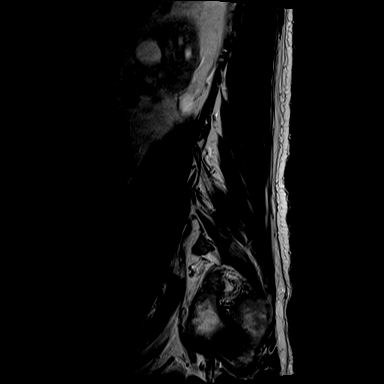

[Series 7: T1 · sagittal · 4.0mm · 0.88mm/px · 6 of 17 slices shown (1 of 2)]
[im 1/17]
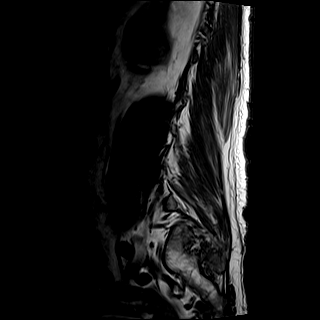
[im 4/17]
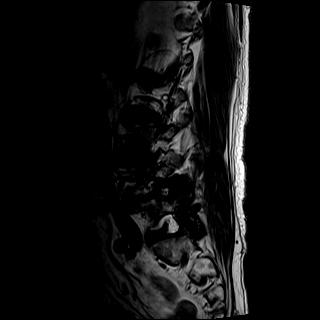
[im 7/17]
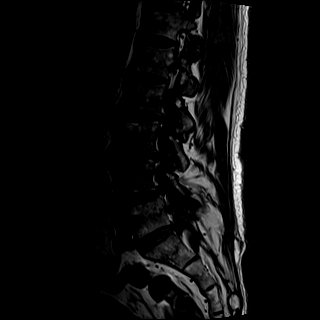
[im 10/17]
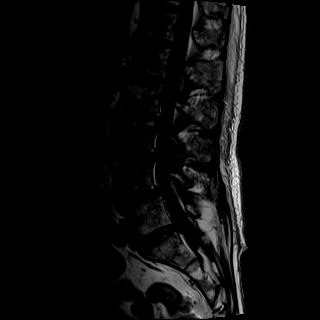
[im 13/17]
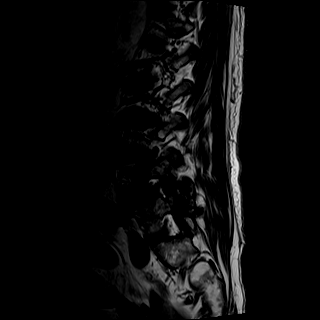
[im 17/17]
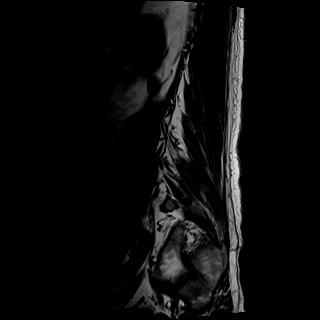

[Series 8: T2 · axial · 4.0mm · 0.57mm/px · z∈[-159,+54]mm · 8 of 38 slices shown (2 of 2)]
[im 1/38]
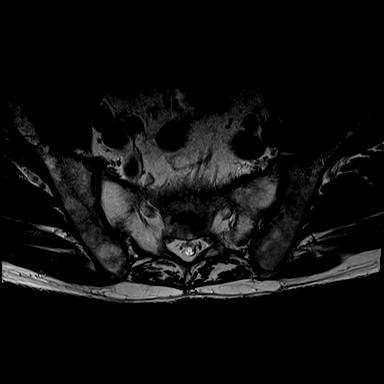
[im 6/38]
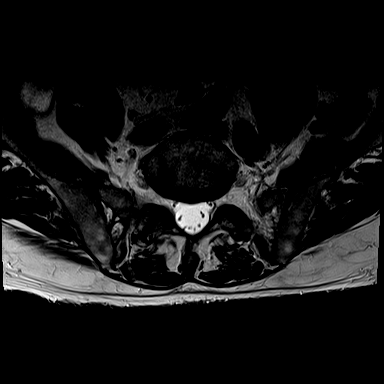
[im 12/38]
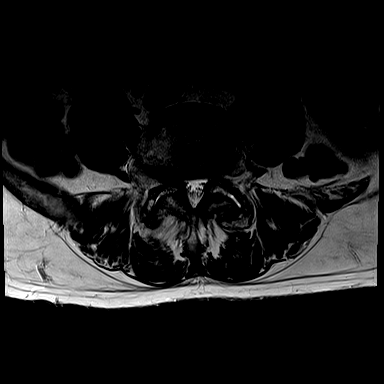
[im 18/38]
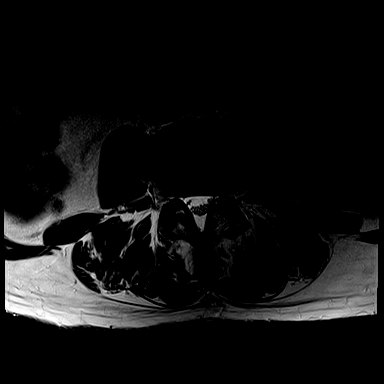
[im 20/38]
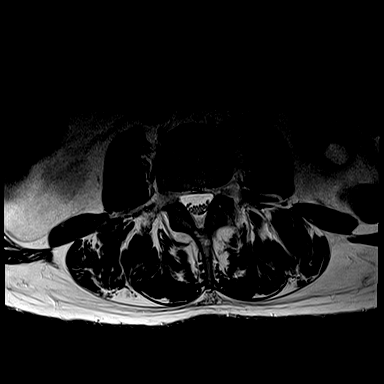
[im 26/38]
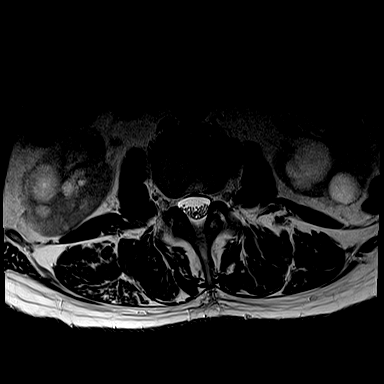
[im 32/38]
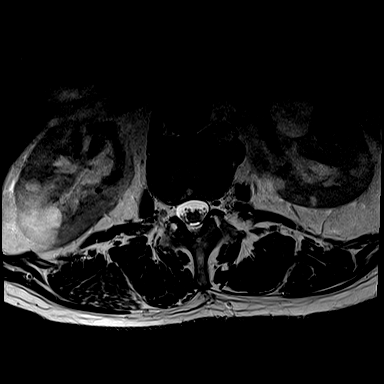
[im 38/38]
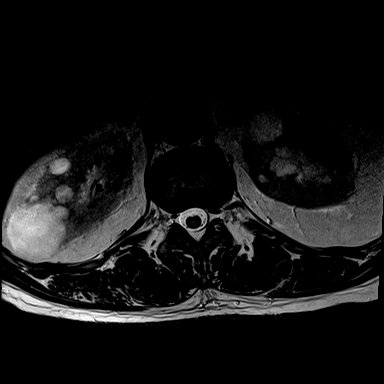

[Series 9: T1 · axial · 4.0mm · 0.34mm/px · z∈[-159,+25]mm · 5 of 38 slices shown (2 of 2)]
[im 1/38]
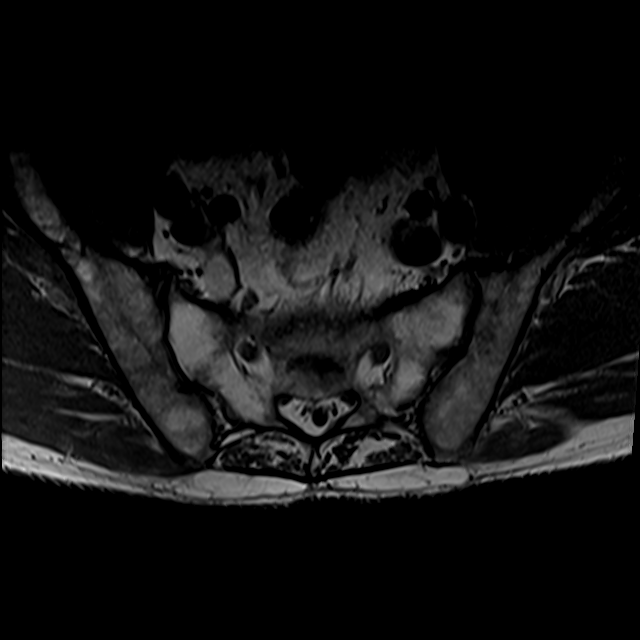
[im 6/38]
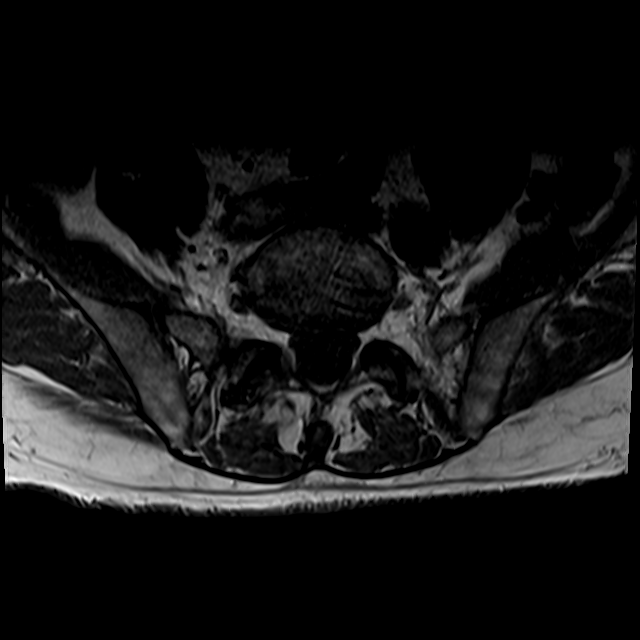
[im 12/38]
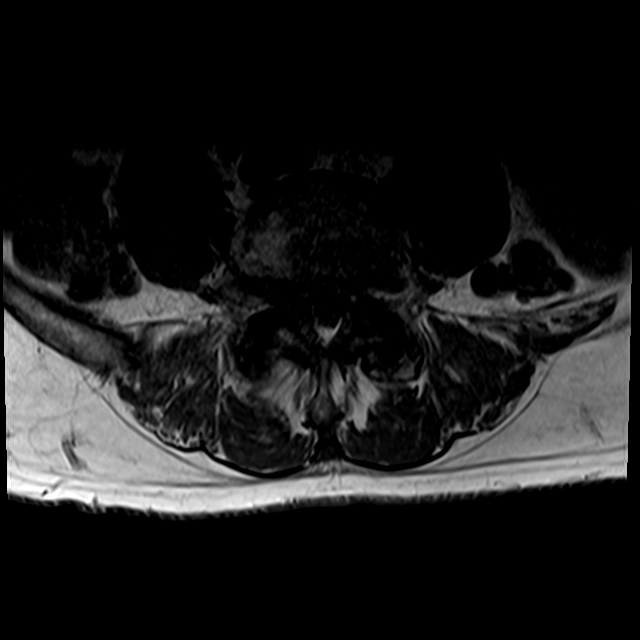
[im 20/38]
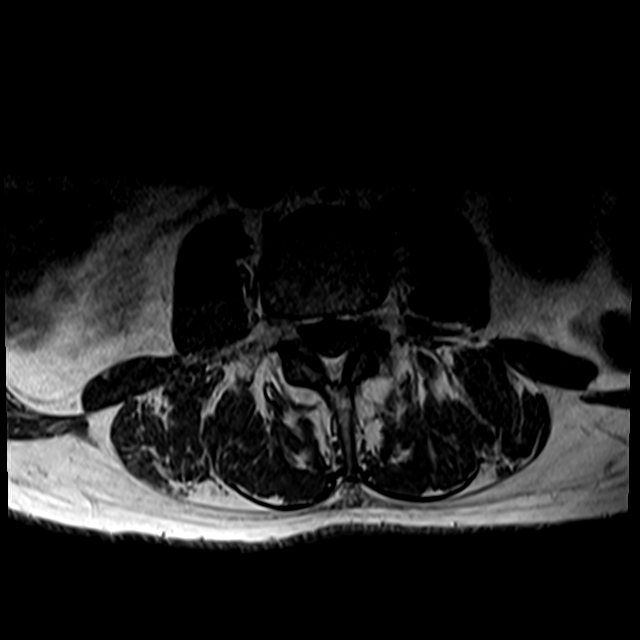
[im 32/38]
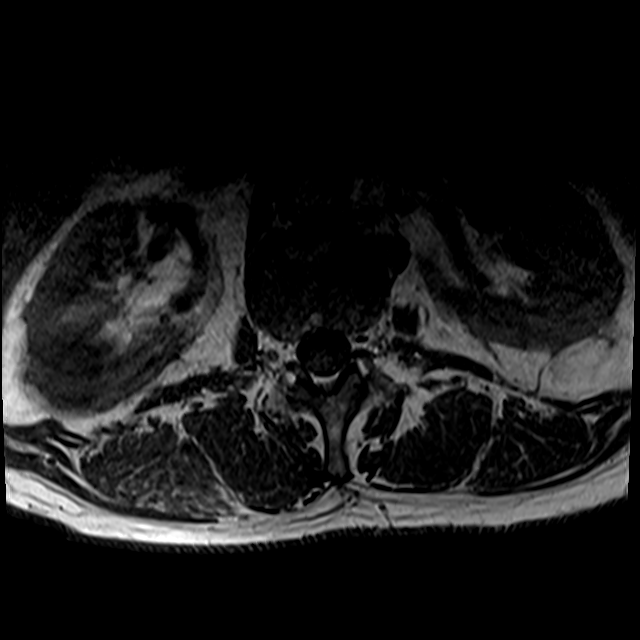

[26 of 48 positions shown; findings below may reference images not displayed]

FINDINGS: Segmentation: Standard.

Alignment: Trace retrolisthesis of L3 on L4. 3 mm anterolisthesis of
L4 on L5.

Vertebrae: No fracture or suspicious osseous lesion. Multilevel
chronic degenerative endplate changes. Prominent right greater than
left facet edema at L4-5.

Conus medullaris and cauda equina: Conus extends to the L2 level.
Conus and cauda equina appear normal.

Paraspinal and other soft tissues: Bilateral renal cysts measuring
up to 3.9 cm. Colonic diverticulosis.

Disc levels:

Disc desiccation and mild disc space narrowing from L2-3 to L4-5.

L1-2: Negative.

L2-3: Disc bulging, prominent dorsal epidural fat, and moderate
facet hypertrophy result in mild spinal stenosis, mild left lateral
recess stenosis, and mild left neural foraminal stenosis.

L3-4: Disc bulging eccentric to the left and mild facet hypertrophy
result in mild bilateral lateral recess stenosis and mild left
greater than right neural foraminal stenosis without significant
spinal stenosis.

L4-5: Anterolisthesis with right eccentric bulging of uncovered
disc, mild ligamentum flavum hypertrophy, and severe facet
hypertrophy result in mild spinal stenosis, moderate right and mild
left lateral recess stenosis, and severe right and mild-to-moderate
left neural foraminal stenosis. Potential right L4 nerve root
impingement. Small facet joint effusions.

L5-S1: Normal disc.  Moderate facet hypertrophy without stenosis.
IMPRESSION: 1. Severe L4-5 facet arthrosis with facet edema, grade 1
anterolisthesis, mild spinal stenosis, and severe right neural
foraminal stenosis.
2. Mild spinal stenosis at L2-3.

## 2020-07-09 IMAGING — MR MR MRA HEAD W/O CM
1 series · 20 of 48 positions shown · non-contrast
Comparison: None.

CLINICAL DATA: Acute visual changes. Scattered punctate acute
embolic cerebral infarcts on head MRI earlier today.

EXAM:
MRA HEAD WITHOUT CONTRAST
TECHNIQUE: Angiographic images of the Circle of Willis were obtained using MRA
technique without intravenous contrast.

[Series 5: 3d cow · axial · 0.5mm · 0.41mm/px · z∈[-53,+28]mm · 20 of 172 slices shown]
[im 1/172]
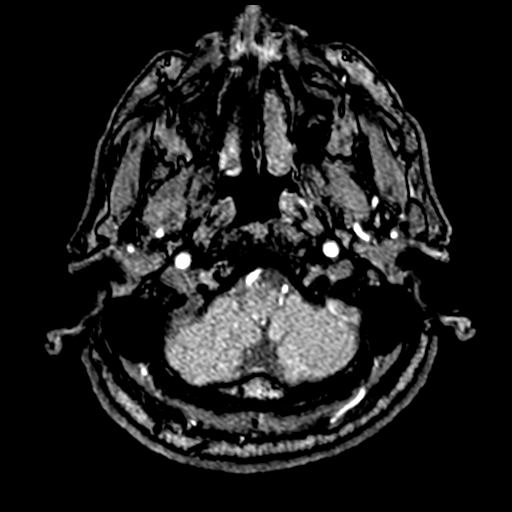
[im 4/172]
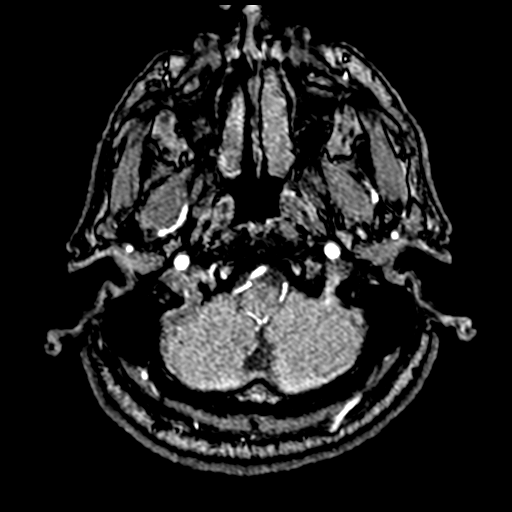
[im 8/172]
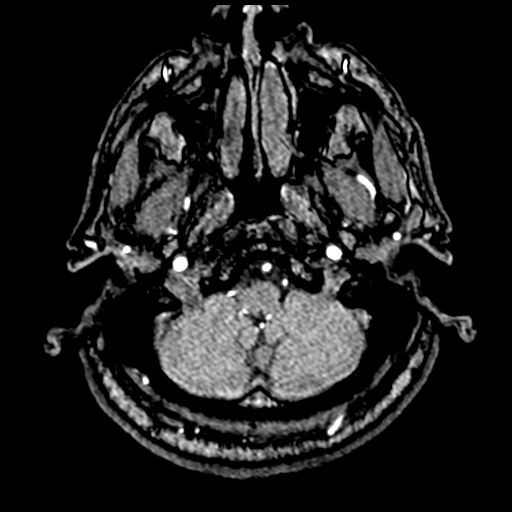
[im 11/172]
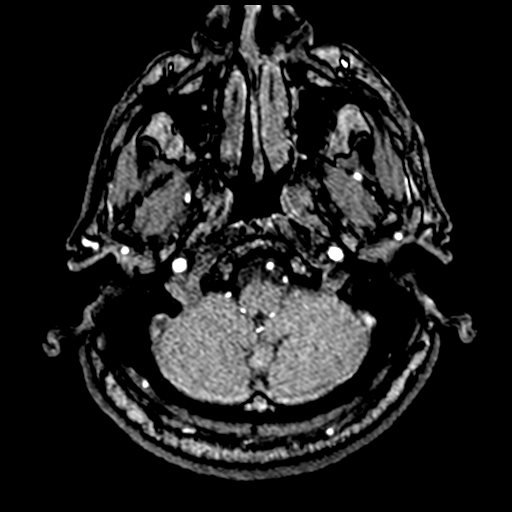
[im 15/172]
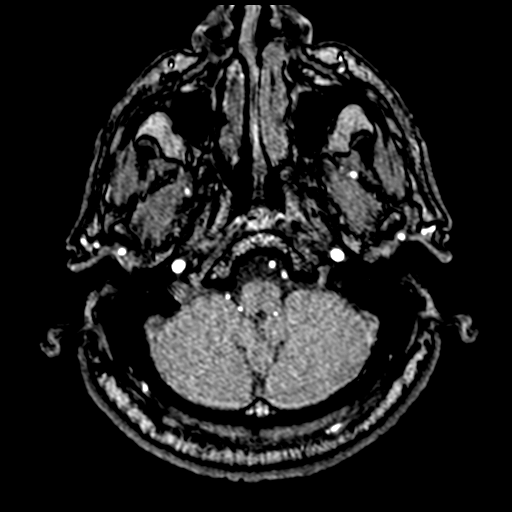
[im 19/172]
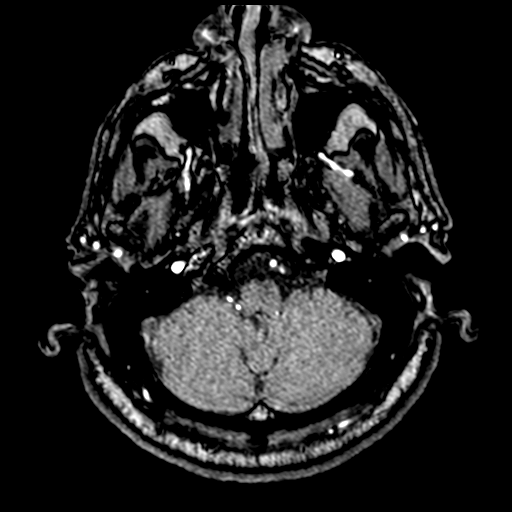
[im 22/172]
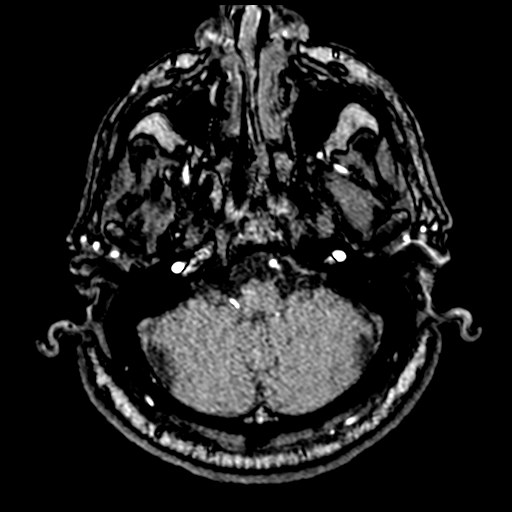
[im 26/172]
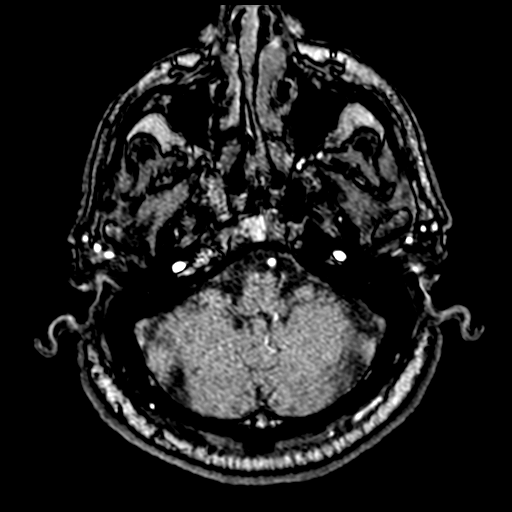
[im 30/172]
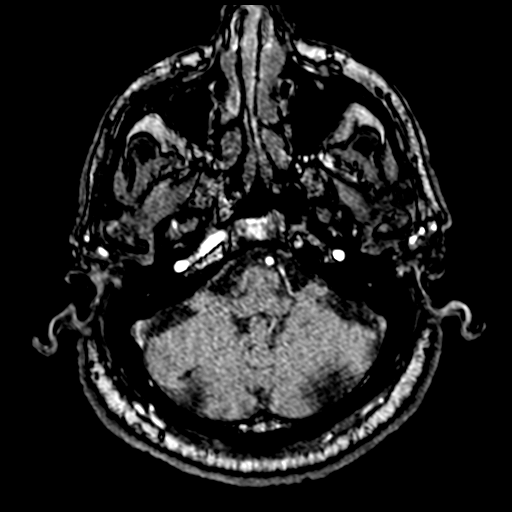
[im 33/172]
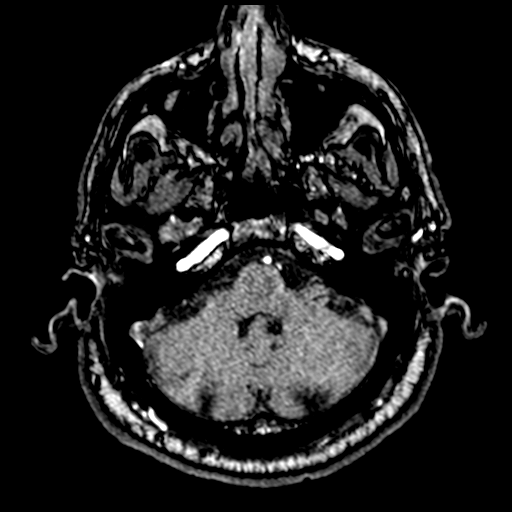
[im 37/172]
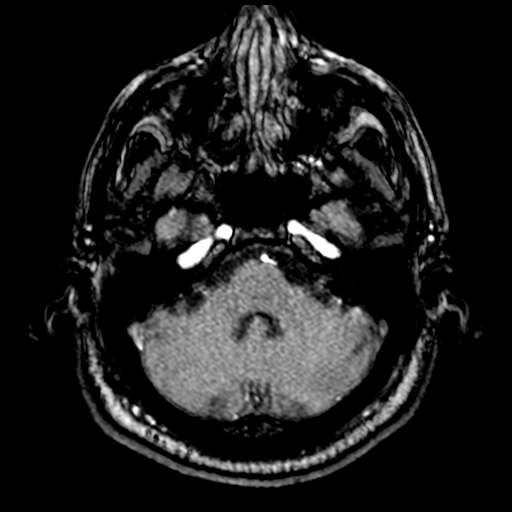
[im 41/172]
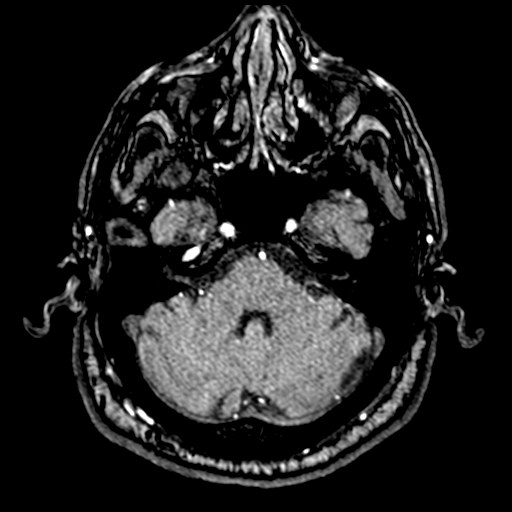
[im 55/172]
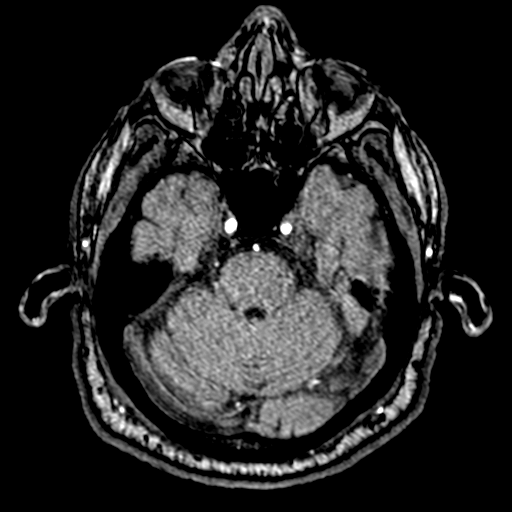
[im 77/172]
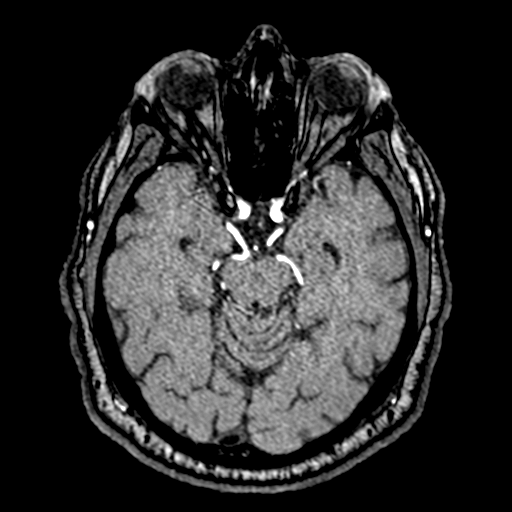
[im 88/172]
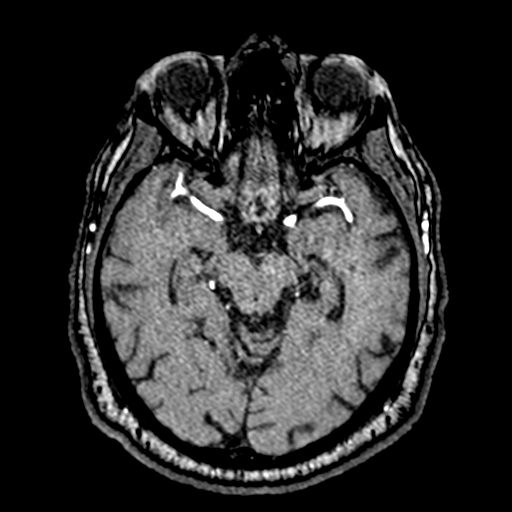
[im 99/172]
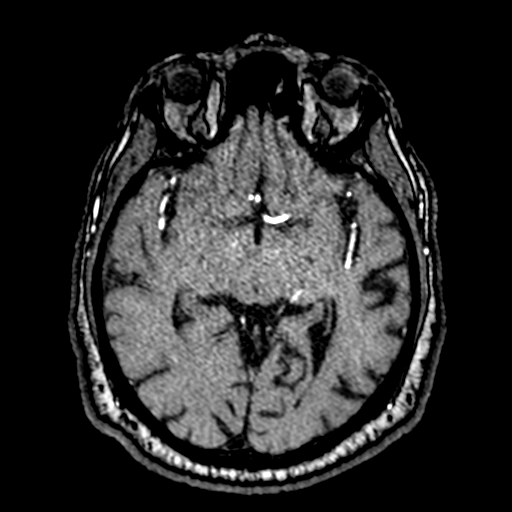
[im 121/172]
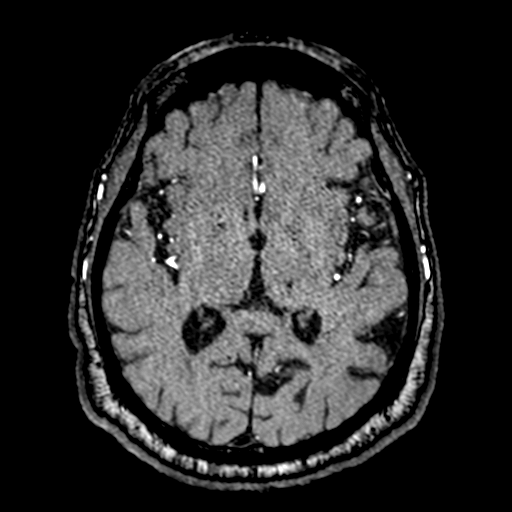
[im 142/172]
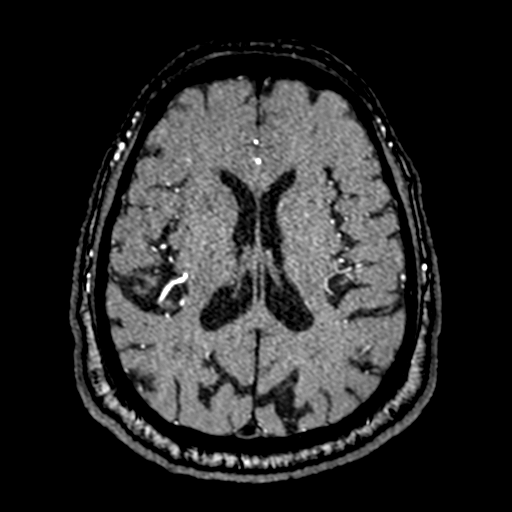
[im 146/172]
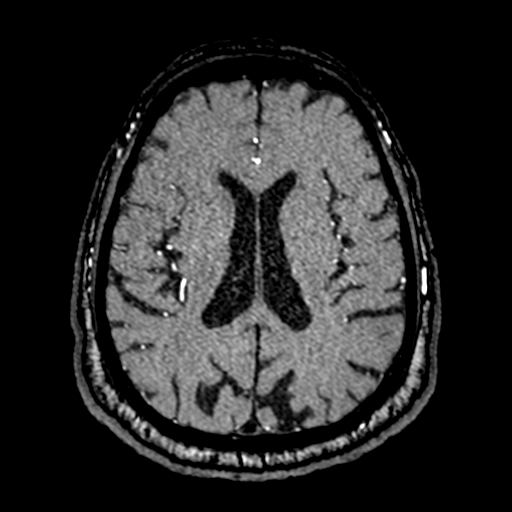
[im 164/172]
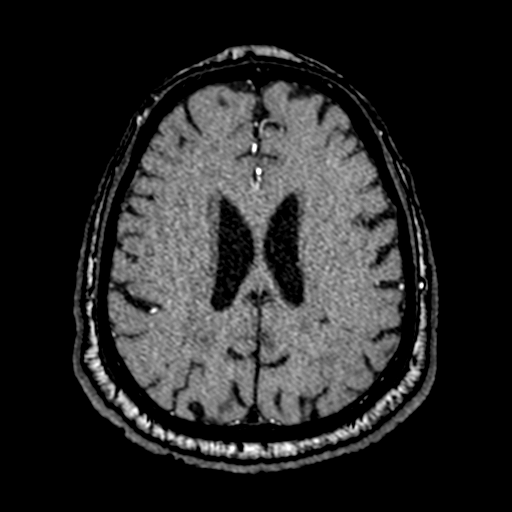

[20 of 48 positions shown; findings below may reference images not displayed]

FINDINGS: The visualized distal vertebral arteries are patent to the basilar
with the right being dominant. Patent bilateral PICA, left AICA, and
bilateral SCA origins are visualized. The right AICA is small and
poorly visualized. The basilar artery is widely patent. There are
large posterior communicating arteries bilaterally with absent right
and hypoplastic left P1 segments. Both PCAs are patent without
evidence of a significant proximal stenosis.

The internal carotid arteries are widely patent from skull base to
carotid termini. ACAs and MCAs are patent without evidence of a
proximal branch occlusion or significant proximal stenosis. No
aneurysm is identified.
IMPRESSION: Negative head MRA.

## 2020-07-09 MED ORDER — ACETAMINOPHEN 325 MG PO TABS: 650.0000 mg | ORAL_TABLET | ORAL | Status: DC | PRN

## 2020-07-09 MED ORDER — ACETAMINOPHEN 160 MG/5ML PO SOLN: 650.0000 mg | ORAL | Status: DC | PRN

## 2020-07-09 MED ORDER — THIAMINE HCL 100 MG PO TABS
100.0000 mg | ORAL_TABLET | Freq: Every day | ORAL | Status: DC
  Administered 2020-07-09 – 2020-07-11 (×3): 100 mg via ORAL
  Filled 2020-07-09 (×3): qty 1

## 2020-07-09 MED ORDER — ASPIRIN 325 MG PO TABS
325.0000 mg | ORAL_TABLET | Freq: Once | ORAL | Status: AC
  Administered 2020-07-10: 325 mg via ORAL
  Filled 2020-07-09: qty 1

## 2020-07-09 MED ORDER — ADULT MULTIVITAMIN W/MINERALS CH
1.0000 | ORAL_TABLET | Freq: Every day | ORAL | Status: DC
  Administered 2020-07-09 – 2020-07-11 (×3): 1 via ORAL
  Filled 2020-07-09 (×3): qty 1

## 2020-07-09 MED ORDER — ASPIRIN 325 MG PO TABS: 325.0000 mg | ORAL_TABLET | Freq: Every day | ORAL | Status: DC

## 2020-07-09 MED ORDER — LORAZEPAM 2 MG/ML IJ SOLN
1.0000 mg | INTRAMUSCULAR | Status: DC | PRN
Start: 2020-07-09 — End: 2020-07-12

## 2020-07-09 MED ORDER — SODIUM CHLORIDE 0.9 % IV SOLN: INTRAVENOUS | Status: DC

## 2020-07-09 MED ORDER — ASPIRIN EC 81 MG PO TBEC
81.0000 mg | DELAYED_RELEASE_TABLET | Freq: Every day | ORAL | Status: DC
  Administered 2020-07-10 – 2020-07-11 (×2): 81 mg via ORAL
  Filled 2020-07-09 (×2): qty 1

## 2020-07-09 MED ORDER — THIAMINE HCL 100 MG/ML IJ SOLN: 100.0000 mg | Freq: Every day | INTRAMUSCULAR | Status: DC

## 2020-07-09 MED ORDER — STROKE: EARLY STAGES OF RECOVERY BOOK
Freq: Once | Status: AC
  Filled 2020-07-09: qty 1

## 2020-07-09 MED ORDER — FOLIC ACID 1 MG PO TABS
1.0000 mg | ORAL_TABLET | Freq: Every day | ORAL | Status: DC
  Administered 2020-07-09 – 2020-07-11 (×3): 1 mg via ORAL
  Filled 2020-07-09 (×3): qty 1

## 2020-07-09 MED ORDER — ENOXAPARIN SODIUM 40 MG/0.4ML ~~LOC~~ SOLN
40.0000 mg | SUBCUTANEOUS | Status: DC
  Administered 2020-07-10 – 2020-07-11 (×2): 40 mg via SUBCUTANEOUS
  Filled 2020-07-09 (×2): qty 0.4

## 2020-07-09 MED ORDER — TETRACAINE HCL 0.5 % OP SOLN
2.0000 [drp] | Freq: Once | OPHTHALMIC | Status: AC
  Administered 2020-07-09: 2 [drp] via OPHTHALMIC
  Filled 2020-07-09: qty 4

## 2020-07-09 MED ORDER — LORAZEPAM 1 MG PO TABS: 1.0000 mg | ORAL_TABLET | ORAL | Status: DC | PRN

## 2020-07-09 MED ORDER — SENNOSIDES-DOCUSATE SODIUM 8.6-50 MG PO TABS
1.0000 | ORAL_TABLET | Freq: Every evening | ORAL | Status: DC | PRN
Start: 2020-07-09 — End: 2020-07-12

## 2020-07-09 MED ORDER — ACETAMINOPHEN 650 MG RE SUPP: 650.0000 mg | RECTAL | Status: DC | PRN

## 2020-07-09 NOTE — Telephone Encounter (Signed)
FYI

## 2020-07-09 NOTE — ED Notes (Signed)
Patient transported to MRI 

## 2020-07-09 NOTE — ED Notes (Signed)
Attempted to call report. Nurse will return call.

## 2020-07-09 NOTE — ED Triage Notes (Signed)
Pt reports double vision and blurred vision in both eyes since 3/9. Pt denies headache or dizziness. Hypertensive in triage. Denies chest pain. Pt a.o, ambulatory

## 2020-07-09 NOTE — Telephone Encounter (Signed)
Pt called stated his vision has been blurry since the woke up on 07/07/20; he is also having double vision; the pt says that he will reach for something and will be no where near the object; recommendations made per nurse triage protocol; he verbalized understanding and will go to the ED; the pt sees Juluis Mire at [facility]; will route to office for notification.   Reason for Disposition . Double vision  Answer Assessment - Initial Assessment Questions 1. DESCRIPTION: "What is the vision loss like? Describe it for me." (e.g., complete vision loss, blurred vision, double vision, floaters, etc.)     Blurry out of focus; double vision 2. LOCATION: "One or both eyes?" If one, ask: "Which eye?"   Both eyes 3. SEVERITY: "Can you see anything?" If Yes, ask: "What can you see?" (e.g., fine print)     Not well; blurry and double vision 4. ONSET: "When did this begin?" "Did it start suddenly or has this been gradual?"    07/07/20 at 0400 5. PATTERN: "Does this come and go, or has it been constant since it started?"    constant 6. PAIN: "Is there any pain in your eye(s)?"  (Scale 1-10; or mild, moderate, severe)     no 7. CONTACTS-GLASSES: "Do you wear contacts or glasses?"    No glasses or contacts 8. CAUSE: "What do you think is causing this visual problem?"      Not sure 9. OTHER SYMPTOMS: "Do you have any other symptoms?" (e.g., confusion, headache, arm or leg weakness, speech problems)     Speech problems before seeing MD 10. PREGNANCY: "Is there any chance you are pregnant?" "When was your last menstrual period?"      n/a  Protocols used: [place]

## 2020-07-09 NOTE — ED Provider Notes (Signed)
[facility]PART[facility] Provider Note   CSN: [id number] Arrival date & time: 07/09/20  1106     History Chief Complaint  Patient presents with  . Diplopia  . Hypertension    Gavin Dennis is a 70 y.o. male.  HPI Patient reports that he has been back on blood pressure medications and compliant for about a month.  He reports yesterday at about 4 AM, he awakened with blurred and double vision.  He reports he got out of bed, turned on the lights and his vision was significantly changed from previous.  He denies that he wears glasses or has any visual problems at baseline.  He reports everything was quite blurry but also there was an element of double vision and items sort of running together.  He denies he had any headache at the time.  He has not had any headache in association with this symptom.  He reports this been persistent ever since it started without changing.  He denies any nausea or vomiting.  He does describe some gait imbalance but reports that is been longstanding.  He does not identify this is new or worse since the visual change.  He cannot discern if it is any different with covering 1 eye.  There has been no eye pain or drainage.  Patient denies that he uses any corrective lenses at baseline.  Patient reports that he quit smoking about a month ago.  Denies any current alcohol or drug use.    Past Medical History:  Diagnosis Date  . Hypertension   . Polio     Patient Active Problem List   Diagnosis Date Noted  . CVA (cerebral vascular accident) ([place]) 07/09/2020    History reviewed. No pertinent surgical history.     No family history on file.  Social History   Tobacco Use  . Smoking status: Former Smoker    Years: 30.00    Types: Cigarettes  . Smokeless tobacco: Never Used  . Tobacco comment: quit a mo ago  Substance Use Topics  . Alcohol use: Not Currently    Comment: quit a mo ago  . Drug use: Not Currently    Types: Cocaine     Home Medications Prior to Admission medications   Medication Sig Start Date End Date Taking? Authorizing Provider  amLODipine (NORVASC) 10 MG tablet Take 1 tablet (10 mg total) by mouth daily. 06/22/20 07/22/20 Yes Kerin Perna, NP  hydrochlorothiazide (HYDRODIURIL) 25 MG tablet Take 1 tablet (25 mg total) by mouth daily. 06/22/20  Yes Kerin Perna, NP    Allergies    Patient has no known allergies.  Review of Systems   Review of Systems 10 systems reviewed and negative except as per HPI Physical Exam Updated Vital Signs BP (!) 161/136   Pulse 70   Temp 98.2 F (36.8 C)   Resp (!) 24   SpO2 97%   Physical Exam Constitutional:      Comments: Alert and nontoxic.  Clinically well in appearance.  Well-nourished well-developed.  HENT:     Head: Normocephalic and atraumatic.  Eyes:     Comments: Pupils are approximately 2 mm and symmetric bilaterally.  Extraocular motions are conjugate.  Patient has arcus senilis.  Left eye appears slightly lower (question old orbital fracture) or with slight lid lag as opposed to right.  Unclear if this is old chronic condition or possibly mild lid lag.  I do not appreciate any disconjugate movement of the eye  muscles.  Intraocular pressure: Right: 17, 15 Left: 18, 19  Cardiovascular:     Rate and Rhythm: Normal rate and regular rhythm.  Pulmonary:     Effort: Pulmonary effort is normal.     Breath sounds: Normal breath sounds.  Abdominal:     General: There is no distension.     Palpations: Abdomen is soft.     Tenderness: There is no abdominal tenderness. There is no guarding.  Musculoskeletal:        General: No swelling or tenderness. Normal range of motion.     Right lower leg: No edema.     Left lower leg: No edema.  Skin:    General: Skin is warm and dry.  Neurological:     General: No focal deficit present.     Mental Status: He is oriented to person, place, and time.     Cranial Nerves: No cranial nerve deficit.      Sensory: No sensory deficit.     Motor: No weakness.     Coordination: Coordination normal.     Comments: Cognitive function speech normal.  Normal finger-nose exam.  Motor strength 5\5 upper and lower extremities.  Sensation intact to light touch x4 extremities.  No visual field deficits.  Independently testing the eyes, patient can correctly count fingers  Psychiatric:        Mood and Affect: Mood normal.     ED Results / Procedures / Treatments   Labs (all labs ordered are listed, but only abnormal results are displayed) Labs Reviewed  COMPREHENSIVE METABOLIC PANEL - Abnormal; Notable for the following components:      Result Value   Glucose, Bld 157 (*)    BUN 25 (*)    Creatinine, Ser 1.38 (*)    AST 46 (*)    ALT 94 (*)    GFR, Estimated 56 (*)    All other components within normal limits  SARS CORONAVIRUS 2 (TAT 6-24 HRS)  PROTIME-INR  APTT  CBC  DIFFERENTIAL  HIV ANTIBODY (ROUTINE TESTING W REFLEX)    EKG None Pending Radiology MR BRAIN WO CONTRAST  Result Date: 07/09/2020 CLINICAL DATA:  Blurred vision.  Double vision.  Hypertension. EXAM: MRI HEAD WITHOUT CONTRAST TECHNIQUE: Multiplanar, multiecho pulse sequences of the brain and surrounding structures were obtained without intravenous contrast. COMPARISON:  Head CT 06/04/2020 FINDINGS: Brain: There are chronic small-vessel ischemic changes of the pons and there are old small vessel cerebellar infarctions. Question punctate acute infarction in the left paramedian mid brain. Single punctate acute infarction in the right parietooccipital junction region. Second punctate acute infarction at the right posterior frontal vertex. In the left hemisphere, there are more numerous punctate acute infarctions including 2 or 3 at the temporal/parietal/occipital junction and 1 within the deep radiating white matter tracts. That could potentially be a tiny cortical infarction in the left frontal lobe. This pattern of small acute  infarctions suggests embolic disease from the heart or ascending aorta. No large vessel territory stroke. Cerebral hemispheres otherwise show moderate chronic small-vessel disease of the thalami, basal ganglia and hemispheric white matter. Vascular: Major vessels at the base of the brain show flow. Skull and upper cervical spine: Negative Sinuses/Orbits: Clear/normal Other: None IMPRESSION: 1. Background pattern of chronic small-vessel ischemic changes throughout the brain. 2. Numerous (5-7) punctate acute infarctions in both cerebral hemispheres consistent with micro embolic infarctions from the heart or ascending aorta. No large confluent infarction. Electronically Signed   By: Jan Fireman.D.  On: 07/09/2020 13:31    Procedures Procedures  CRITICAL CARE Performed by: Charlesetta Shanks   Total critical care time: 30 minutes  Critical care time was exclusive of separately billable procedures and treating other patients.  Critical care was necessary to treat or prevent imminent or life-threatening deterioration.  Critical care was time spent personally by me on the following activities: development of treatment plan with patient and/or surrogate as well as nursing, discussions with consultants, evaluation of patient's response to treatment, examination of patient, obtaining history from patient or surrogate, ordering and performing treatments and interventions, ordering and review of laboratory studies, ordering and review of radiographic studies, pulse oximetry and re-evaluation of patient's condition. Medications Ordered in ED Medications   stroke: mapping our early stages of recovery book (has no administration in time range)  0.9 %  sodium chloride infusion (has no administration in time range)  acetaminophen (TYLENOL) tablet 650 mg (has no administration in time range)    Or  acetaminophen (TYLENOL) 160 MG/5ML solution 650 mg (has no administration in time range)    Or  acetaminophen  (TYLENOL) suppository 650 mg (has no administration in time range)  senna-docusate (Senokot-S) tablet 1 tablet (has no administration in time range)  enoxaparin (LOVENOX) injection 40 mg (has no administration in time range)  aspirin tablet 325 mg (has no administration in time range)  tetracaine (PONTOCAINE) 0.5 % ophthalmic solution 2 drop (2 drops Both Eyes Given 07/09/20 1400)    ED Course  I have reviewed the triage vital signs and the nursing notes.  Pertinent labs & imaging results that were available during my care of the patient were reviewed by me and considered in my medical decision making (see chart for details).    MDM Rules/Calculators/A&P                          Consult: Neurology Dr. Curly [place] Consult: Family medicine admission  Patient presents with symptoms of blurred vision and double vision.  He denies any focal neurologic deficits but describes fairly longstanding gait instability.  Exam does not show any focal neurologic deficits.  Ocular exam did not exhibit nystagmus, increased intraocular pressure or visual field deficit.  Patient has high risk factors for stroke.  MRI has confirmed acute areas of stroke.  At this time patient will be admitted for further stroke work-up and management.  Mental status is clear, no lateralizing neurologic deficits. Final Clinical Impression(s) / ED Diagnoses Final diagnoses:  Visual disturbance  Acute CVA (cerebrovascular accident) [facility])    Rx / [place] Orders ED Discharge Orders    None       Charlesetta Shanks, MD 07/09/20 1553

## 2020-07-09 NOTE — Consult Note (Signed)
NEUROLOGY CONSULTATION NOTE   Date of service: July 09, 2020 Patient Name: Gavin Dennis MRN:  [id number] DOB:  1951-03-17 Reason for consult: "Stroke" _ _ _   _ __   _ __ _ _  __ __   _ __   __ _  History of Present Illness  Gavin Dennis is a 70 y.o. male with PMH significant for longstanding history of hypertension, recently started seeing a primary care doctor about a month ago and has not seen a doctor in 3 to 25 years who presents with acute onset vision changes that he woke up with on 07/07/2020.  Patient reports that he went to bed at midnight on 07/07/2020 and woke up at 4 AM with difficulty judging the distance between himself and the objects around him and reporting that the proportion of floors are off.  The symptoms are persistent so he came to the emergency department for evaluation.  He denies any prior history of strokes, no arm or leg weakness or numbness, no dysarthria, no hemianopsia.  Endorses that he has had left foot neuropathy for the last couple years with pins and needle sensation in the bottom of his foot on the left.  Also endorses urinary issues for the last couple years with a sense of urinary urgency, unable to hold urine for reasonable amount of time and trouble completely emptying his bladder.  Denies any prior diagnosis of BPH, does not feel that he has a weak urinary stream.  Also endorses positive Lhermitte sign with shock sensation running from his neck into his lower legs periodically.  Endorses some problems with his left leg but is unable to characterize them in detail.  Feels like it is weak from time to time but that has been going on for the last 2 to 3 years.  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  He does not take aspirin at home, endorses significant history of stroke in his family.  No family history of other neurological illnesses.  Endorses that he does not eat a well-balanced diet and usually  skips on vegetables.  Denies any signs or symptoms of a URI, UTI, no headaches, no mood issues.   ROS   Constitutional Denies weight loss, fever and chills.   HEENT Denies changes in vision and hearing.   Respiratory Denies SOB and cough.   CV Denies palpitations and CP   GI Denies abdominal pain, nausea, vomiting and diarrhea.   GU Denies dysuria and urinary frequency.   MSK Denies myalgia and joint pain.   Skin Denies rash and pruritus.   Neurological Denies headache and syncope.   Psychiatric Denies recent changes in mood. Denies anxiety and depression.    Past History   Past Medical History:  Diagnosis Date  . Hypertension   . Polio    History reviewed. No pertinent surgical history. No family history on file. Social History   Socioeconomic History  . Marital status: Married    Spouse name: Not on file  . Number of children: Not on file  . Years of education: Not on file  . Highest education level: Not on file  Occupational History  . Not on file  Tobacco Use  . Smoking status: Former Smoker    Years: 30.00    Types: Cigarettes  . Smokeless tobacco: Never Used  . Tobacco comment: quit a mo ago  Substance and Sexual Activity  .  Alcohol use: Not Currently    Comment: quit a mo ago  . Drug use: Not Currently    Types: Cocaine  . Sexual activity: Not on file  Other Topics Concern  . Not on file  Social History Narrative  . Not on file   Social Determinants of Health   Financial Resource Strain: Not on file  Food Insecurity: Not on file  Transportation Needs: Not on file  Physical Activity: Not on file  Stress: Not on file  Social Connections: Not on file   No Known Allergies  Medications  (Not in a hospital admission)    Vitals   Vitals:   07/09/20 1345 07/09/20 1400 07/09/20 1415 07/09/20 1430  BP: (!) 163/104 (!) 148/107 (!) 171/121 (!) 161/136  Pulse:  70    Resp: (!) 23 (!) 24 (!) 22 (!) 24  Temp:      SpO2:  97%       There is no  height or weight on file to calculate BMI.  Physical Exam   General: Laying comfortably in bed; in no acute distress.  HENT: Normal oropharynx and mucosa. Normal external appearance of ears and nose.  Neck: Supple, no pain or tenderness  CV: No JVD. No peripheral edema.  Pulmonary: Symmetric Chest rise. Normal respiratory effort.  Abdomen: Soft to touch, non-tender.  Ext: No cyanosis, edema, or deformity  Skin: No rash. Normal palpation of skin.   Musculoskeletal: Normal digits and nails by inspection. No clubbing.   Neurologic Examination  Mental status/Cognition: Alert, oriented to self, place, month and year, good attention. 3/3 recall immediately. 0/3 recall at 5 mins. Speech/language: Fluent, comprehension intact, object naming intact, repetition intact.  Cranial nerves:   CN II Pupils equal and reactive to light, no VF deficits    CN III,IV,VI EOM intact, no gaze preference or deviation, no nystagmus, mildly dysconjugate gaze with L upper eyelid droop.   CN V normal sensation in V1, V2, and V3 segments bilaterally    CN VII no asymmetry, no nasolabial fold flattening    CN VIII normal hearing to speech    CN IX & X normal palatal elevation, no uvular deviation    CN XI 5/5 head turn and 5/5 shoulder shrug bilaterally    CN XII midline tongue protrusion    Motor:  Muscle bulk: normal, tone normal, pronator drift none tremor none Mvmt Root Nerve  Muscle Right Left Comments  SA C5/6 Ax Deltoid 5 5   EF C5/6 Mc Biceps 5 5   EE C6/7/8 Rad Triceps 5 5   WF C6/7 Med FCR 5 5   WE C7/8 PIN ECU 5 5   F Ab C8/T1 U ADM/FDI 5 5   HF L1/2/3 Fem Illopsoas 5 5   KE L2/3/4 Fem Quad 5 5   DF L4/5 D Peron Tib Ant 5 5   PF S1/2 Tibial Grc/Sol 5 5    Reflexes:  Right Left Comments  Pectoralis      Biceps (C5/6) 2 2   Brachioradialis (C5/6) 2 2    Triceps (C6/7) 2 2    Patellar (L3/4) 3 3 Cross adductors +   Achilles (S1) 2 2    Hoffman - -    Plantar equivocal equivocal   Jaw jerk     Sensation:  Light touch Intact throughout   Pin prick    Temperature    Vibration   Proprioception    Coordination/Complex Motor:  - Finger to Nose  Intact BL - Heel to shin intact BL - Rapid alternating movement are normal - Gait: Deferred.  Labs   CBC:  Recent Labs  Lab 07/09/20 1143  WBC 5.5  NEUTROABS 2.5  HGB 16.3  HCT 47.6  MCV 91.9  PLT 423    Basic Metabolic Panel:  Lab Results  Component Value Date   NA 138 07/09/2020   K 3.8 07/09/2020   CO2 27 07/09/2020   GLUCOSE 157 (H) 07/09/2020   BUN 25 (H) 07/09/2020   CREATININE 1.38 (H) 07/09/2020   CALCIUM 10.2 07/09/2020   GFRNONAA 56 (L) 07/09/2020   GFRAA  09/22/2010    >60        The eGFR has been calculated using the MDRD equation. This calculation has not been validated in all clinical situations. eGFR's persistently <60 mL/min signify possible Chronic Kidney Disease.   Lipid Panel: No results found for: LDLCALC HgbA1c: No results found for: HGBA1C Urine Drug Screen: No results found for: LABOPIA, COCAINSCRNUR, LABBENZ, AMPHETMU, THCU, LABBARB  Alcohol Level No results found for: [facility]  MRI Brain  1. Background pattern of chronic small-vessel ischemic changes throughout the brain. 2. Numerous (5-7) punctate acute infarctions in both cerebral hemispheres consistent with micro embolic infarctions from the heart or ascending aorta. No large confluent infarction.  Impression   Gavin Dennis is a 70 y.o. male with hx of poorly controlled HTN, p/w acute onset vision changes that he could best describes as proportion of objects appear weird with a LKW of 0000 on 07/07/20. Outside the window for tPA and thrombectomy at presentation. Endorses chronic L foot tingling along with urinary urgency and incomplete emptying of bladder and lhermittes sign on exam. Neuro exam with L upper eyelid drooping along with mildly dysconjugate gaze. MRI brain demonstrates puntate infarcts in BL cerebral  hemispheres.  Most of these punctate infarcts are in white matter but he does have one lesion in the left occipital lobe that is more cortical in origin.   Recommendations  Plan:  - Vascular imaging with MRA Angio Head without contrast and US Carotid doppler is pending. - TTE is pending - Lipid panel with LDL is pending. - Please start statin if LDL > 70 - HbA1c is pending. - Antithrombotic - Aspirin 74m daily - Recommend DVT ppx - SBP goal - permissive hypertension first 24 h < 220/110. Held home meds.  - Recommend Telemetry monitoring for arrythmia - Recommend bedside swallow screen prior to PO intake. - Stroke education booklet - Recommend PT/OT/SLP consult  In addition to stroke workup, I ordered spine imaging given some concern for potential cord compression with +lhermitte's sign and urinary urgency and incomplete emptying of bladder with hyperreflexia. - I ordered MRI C, T, L spine without contrast - I ordered Vit B12, TSH, Folate, SPEP for workup for neuropathy.  ______________________________________________________________________   Thank you for the opportunity to take part in the care of this patient. If you have any further questions, please contact the neurology consultation attending.  Signed,  SWhitfieldPager Number [phone]_ _ _   _ __   _ __ _ _  __ __   _ __   __ _

## 2020-07-09 NOTE — Hospital Course (Addendum)
Gavin Dennis is a 70 y.o. male presenting with acute new stroke. PMH is significant for HTN and polysubstance abuse.  CVA 70 year old male presented with diplopia and blurry vision. MRI brain: Multiple punctate infarctions in cerebral hemispheres with microembolic infarctions from heart or a sending aorta. MRA brain: Negative. Echo on 3/11: EF 55 to 60%, mild LVH, grade 1 diastolic dysfunction. Neuro consulted who recommended: ASA 81 mg (indefinitely) and Plavix 75 mg (21 days only) and risk stratification. A1c-6.3. Lipid panel-HDL 39, LDL 75 and TSH 1.812.  Patient was started on atorvastatin 20 mg daily   Covid positive, new Patient was found to be Covid positive on admission.  Was asymptomatic.  Has received all 3 Covid vaccines.  Discussed with pharmacy and patient regarding remdesivir for 3 days given his risk factors and age.  Patient decided to have remdesivir loading infusion in hospital prior to discharge.  His vital signs remained stable with sats >95% throughout hospital admission.   Hypertension Amlodipine and HCTZ were held in the setting of CVA and we allowed for permissive hypertension.  Amlodipine restarted on discharge.  Alcoholic hepatitis  Patient drinks 1.5 L of wine a night.  AST and ALT mildly elevated to 46 and 94 respectively  AKI Creatinine on admission 1.38. Cr 1.15 on discharge.

## 2020-07-09 NOTE — H&P (Addendum)
[facility] Admission History and Physical Service Pager: [phone]  Patient name: Gavin Dennis Medical record number: [id number] Date of birth: [birth year] Age: 70 y.o. Gender: male  Primary Care Provider: Pcp, No Consultants: Neurology Code Status: Full Preferred Emergency Contact: Eben Burow, son  Chief Complaint: Vision changes  Assessment and Plan: Gavin Dennis is a 70 y.o. male presenting with diplopia and blurry vision with acute infarcts on MR brain. PMH is significant for HTN, polysubstance abuse.  Stroke: acute 70 yo man presents with diplopia and blurry vision symptoms x2 days. MR brain shows numerous punctate acute infarctions in both cerebral hemispheres c/w microembolic infarctions from the heart or ascending aorta. Risk Factors for stroke include HTN (recently started medication), history of tobacco, alcohol and cocaine abuse; will add risk stratification labs for atherosclerosis and DM. Arcus senilis appreciated to b/l eyes. BP elevated to 182/117 upon arrival to ED, continues to be hypertensive at 161/136. Neurology consulted. Neuro exam is reassuring and without focal difficulties. Patient admits only to visual difficulties. Eyes are sluggishly reactive to light and slight ptosis observed to left eyelid, otherwise EOMI. - Admit to medical-tele- obs, attending Dr. Ardelia Mems  - Vitals per floor protocol - Up with assistance  - Neuro following; appreciate recs - Monitor on telemetry - Frequent neuro checks - Echo - Carotid dopplers - Permissive hypertension x24 hours - Risk strat labs: hgb A1C, lipid, TSH tomorrow AM - NPO pending swallow eval - PT/OT/SLP - ASA 325 mg, then 81 mg daily  - DVT ppx Lovenox   Hypertension: chronic, uncontrolled Hypertensive to 182/117 on arrival to ED. Most recent BP 161/136. Home medications notable for amlodipine 10 mg daily and HCTZ 25 mg daily. Of note, patient was seen in ED after referral from Urgent  Care on 06/04/20 for elevated BP's and an EKG concerning for STEMI. In ED, EKG reported as LVH without STEMI, also with negative Troponins and BNP and code STEMI cancelled. At that time patient was started on Amlodipine and discharged. States he followed up with PCP for HTN at appointment 3/8. -Holding home medications in the setting of permissive HTN  ? Cervical Radiculopathy  Neuropathy  Hx Urinary Incontinence Patient complains of tingling sensation bilateral legs.  Also reports multiple falls for quite some time.  Most recent falls last month.  He notes trouble with balance.  Also occasionally has shooting pains down his neck that spontaneously go away.  He has never been evaluated for this in the past.  He reports having a good appetite, however, question concern for nutritional deficiencies causing neuropathy versus radiculopathy.  Also admits to urinary incontinence and problems with bladder emptying.  Will need imaging to rule out cord compression that this could also be due to BPH.  Also of note, patients father passed away from prostate cancer. -Neurology consulted, appreciate recommendations -MR cervical, thoracic and lumbar spine -Methylmalonic acid -Protein electrophoresis -Vit B12 -Folate -Consider PSA; shared decision making with patient   Elevated LFT's: acute, stable AST 46, ATL 94. Increased from lab work on 06/04/20 in which LFT's were normal.  Normal bili and alk phos. Likely in the setting of acute stroke. Could also consider alcoholic hepatitis as patient drinks about 1.5 liters of wine nightly; also can consider hepatic steatosis though less likely given extensive etoh use. Not on ACE-I/ARB. No recent abx and not on statin. Abdominal exam unremarkable. -Repeat CMP in AM -TSH -F/u lipid panel -CIWA  AKI Creatinine 1.38, GFR 56. Increased from last ED  visit in which he had creatinine of 1.02, GFR >60.  -Avoid nephrotoxic agents -AM CMP -NS at 100 mL/hr  Hx Tobacco Abuse,  Cocaine Abuse  Alcohol Abuse Reports quitting smoking and cocaine x1 month. Smoked 1 ppd about every day and a half since he was [age] years old. Quit cigarettes and cocaine cold [place] 1 month ago. Declines Nicotine patch. Has decreased alcohol intake as well. Admits to drinking beer, liquor and wine though lately has had more wine. Drinks about 1.5L/day.  -CIWA -Offer Nicotine patch   FEN/GI: NPO until bedside swallow eval and then heart healthy if passes Prophylaxis: Lovenox  Disposition: Med-Tele  History of Present Illness:  Gavin Dennis is a 70 y.o. male presenting with visual disturbances with acute infarcts found on head imaging.  Patient woke up with blurriness and double vision on March 9th. He had seen his PCP on March 8th. No headache, no weakness. He reports that he does have history of falls for quite some time with numbness and tingling in his legs occasionally, mostly in the bottom of his left foot. He sometimes has sharp pain that goes down the back of his neck, that has happened on and off for many years. He reports that he has trouble urinating and feels like he can never empty his bladder. He denies CP, SOB, n/v/d, abdominal pain. He occasionally has constipation, uses milk of magnesia prn. Last BM this morning, soft, no problems. Denies unintentional weight loss, normal appetite. He does all of his ADLs and IADLs by himself except driving. He takes amlodipine and HCTZ at 8 AM daily for the last 2 months. His PCP is [facility], just started going 1 month ago. He used to smoke 1ppd and crack daily, but quit 1 month ago. 50 pack year history. He has decreased etoh intake, has about 1.5 liters of wine per night. He had seizures and hallucinations from etoh withdrawal perhaps 30 years ago. Patient reports history of polio as a small child.  Alert, oriented to self, place, time, and context.   ED Course: MRI Brain obtained and significant for numerous (5-7) punctate  acute infarctions in both cerebral hemispheres consistent with micro embolic infarctions from the heart or ascending aorta. No large confluent infarction. Neurology consulted. Lab work generally unremarkable.    Review Of Systems: Per HPI with the following additions:    Review of Systems  Constitutional: Negative for activity change, appetite change, chills and fever.  Eyes: Positive for visual disturbance. Negative for photophobia and pain.       Double vision, blurry  Respiratory: Negative for cough and shortness of breath.   Cardiovascular: Negative for chest pain and palpitations.  Gastrointestinal: Negative for abdominal pain, constipation, diarrhea, nausea and vomiting.  Genitourinary: Positive for difficulty urinating. Negative for dysuria.  Musculoskeletal: Positive for back pain and neck pain. Negative for myalgias and neck stiffness.  Neurological: Negative for dizziness, tremors, seizures, syncope, facial asymmetry, speech difficulty, weakness, light-headedness, numbness and headaches.  All other systems reviewed and are negative.    Patient Active Problem List   Diagnosis Date Noted  . CVA (cerebral vascular accident) ([place]) 07/09/2020    Past Medical History: Past Medical History:  Diagnosis Date  . Hypertension   . Polio     Past Surgical History: History reviewed. No pertinent surgical history.  Social History: Social History   Tobacco Use  . Smoking status: Former Smoker    Years: 30.00    Types: Cigarettes  . Smokeless  tobacco: Never Used  . Tobacco comment: quit a mo ago  Substance Use Topics  . Alcohol use: Not Currently    Comment: quit a mo ago  . Drug use: Not Currently    Types: Cocaine   Additional social history: patient lives with a friend and his son helps him with transportation and driving Please also refer to relevant sections of EMR.  Family History: No family history on file. Mother passed away from heart disease in her 18s Father  passed away from prostate cancer in his 33s Brother passed from lung cancer in his 29s   Allergies and Medications: No Known Allergies No current facility-administered medications on file prior to encounter.   Current Outpatient Medications on File Prior to Encounter  Medication Sig Dispense Refill  . amLODipine (NORVASC) 10 MG tablet Take 1 tablet (10 mg total) by mouth daily. 90 tablet 1  . hydrochlorothiazide (HYDRODIURIL) 25 MG tablet Take 1 tablet (25 mg total) by mouth daily. 90 tablet 3    Objective: BP (!) 185/129   Pulse 70   Temp 98.2 F (36.8 C)   Resp (!) 25   SpO2 97%  Exam: General: No acute distress, pleasant, AxO x4 Eyes: Arcus senelis b/l, EOMI, sluggishly reactive pupils b/l Neck: supple, no cervical spine tenderness, negative Lhermitte's sign, negative [place]ling Cardiovascular: RRR without murmur Respiratory: CTAB without wheezing/rhonchi/rales Gastrointestinal: non-tender in all quadrants, non-distended, no rebound/guarding MSK: Hammer toe to left 2nd digit. No other obvious deformities, 5/5 strength to b/l upper and lower extremities Derm: No rashes, lower extremities cool to touch but with palpable pulses, no edema, +clubbing of all fingernails Psych: Mood appropriate Neuro:  Cranial Nerves: II: Visual Fields are full. Sluggishly reactive pupils b/l III,IV, VI: EOMI, ptosis of left eye V: Facial sensation is symmetric to touch VII: Facial movement is symmetric.  VIII: hearing is intact to voice  X: Palate elevates symmetrically XI: Shoulder shrug is symmetric. XII: tongue is midline without atrophy or fasciculations.  Motor: Tone is normal. Bulk is normal. 5/5 strength was present in all four extremities.  Sensory: Sensation is symmetric to light touch in the arms and legs. Deep Tendon Reflexes: Not assessed. Plantars: Toes are downgoing bilaterally.  Cerebellar: Alternating hand movements slowed but intact bilaterally   Labs and Imaging: CBC  BMET  Recent Labs  Lab 07/09/20 1143  WBC 5.5  HGB 16.3  HCT 47.6  PLT 229   Recent Labs  Lab 07/09/20 1143  NA 138  K 3.8  CL 106  CO2 27  BUN 25*  CREATININE 1.38*  GLUCOSE 157*  CALCIUM 10.2     EKG: Sinus rhythm rate 72, LVH  MR BRAIN WO CONTRAST  Result Date: 07/09/2020 CLINICAL DATA:  Blurred vision.  Double vision.  Hypertension. EXAM: MRI HEAD WITHOUT CONTRAST TECHNIQUE: Multiplanar, multiecho pulse sequences of the brain and surrounding structures were obtained without intravenous contrast. COMPARISON:  Head CT 06/04/2020 FINDINGS: Brain: There are chronic small-vessel ischemic changes of the pons and there are old small vessel cerebellar infarctions. Question punctate acute infarction in the left paramedian mid brain. Single punctate acute infarction in the right parietooccipital junction region. Second punctate acute infarction at the right posterior frontal vertex. In the left hemisphere, there are more numerous punctate acute infarctions including 2 or 3 at the temporal/parietal/occipital junction and 1 within the deep radiating white matter tracts. That could potentially be a tiny cortical infarction in the left frontal lobe. This pattern of small acute infarctions suggests  embolic disease from the heart or ascending aorta. No large vessel territory stroke. Cerebral hemispheres otherwise show moderate chronic small-vessel disease of the thalami, basal ganglia and hemispheric white matter. Vascular: Major vessels at the base of the brain show flow. Skull and upper cervical spine: Negative Sinuses/Orbits: Clear/normal Other: None IMPRESSION: 1. Background pattern of chronic small-vessel ischemic changes throughout the brain. 2. Numerous (5-7) punctate acute infarctions in both cerebral hemispheres consistent with micro embolic infarctions from the heart or ascending aorta. No large confluent infarction. Electronically Signed   By: Nelson Chimes M.D.   On: 07/09/2020 13:31    ECHOCARDIOGRAM COMPLETE  Result Date: 07/09/2020    ECHOCARDIOGRAM REPORT   Patient Name:   JOSUEL KOEPPEN Date of Exam: 07/09/2020 Medical Rec #:  [id number]    Height:       71.0 in Accession #:    [id number]   Weight:       183.4 lb Date of Birth:  24-Jan-1951    BSA:          2.032 m Patient Age:    7 years     BP:           161/136 mmHg Patient Gender: M            HR:           73 bpm. Exam Location:  Inpatient Procedure: 2D Echo Indications:    stroke  History:        Patient has no prior history of Echocardiogram examinations.                 Risk Factors:Hypertension.  Sonographer:    Johny Chess Referring Phys: [id number] [place]  1. Left ventricular ejection fraction, by estimation, is 55 to 60%. The left ventricle has normal function. The left ventricle has no regional wall motion abnormalities. There is mild left ventricular hypertrophy. Left ventricular diastolic parameters are consistent with Grade I diastolic dysfunction (impaired relaxation).  2. Right ventricular systolic function is normal. The right ventricular size is normal. There is normal pulmonary artery systolic pressure. The estimated right ventricular systolic pressure is 09.3 mmHg.  3. The mitral valve is normal in structure. Trivial mitral valve regurgitation. No evidence of mitral stenosis.  4. The aortic valve is tricuspid. There is mild calcification of the aortic valve. Aortic valve regurgitation is not visualized. No aortic stenosis is present.  5. Aortic dilatation noted. There is mild dilatation of the ascending aorta, measuring 40 mm.  6. The inferior vena cava is normal in size with greater than 50% respiratory variability, suggesting right atrial pressure of 3 mmHg. Conclusion(s)/Recommendation(s): No intracardiac source of embolism detected on this transthoracic study. A transesophageal echocardiogram is recommended to exclude cardiac source of embolism if clinically indicated. FINDINGS  Left  Ventricle: Left ventricular ejection fraction, by estimation, is 55 to 60%. The left ventricle has normal function. The left ventricle has no regional wall motion abnormalities. The left ventricular internal cavity size was normal in size. There is  mild left ventricular hypertrophy. Left ventricular diastolic parameters are consistent with Grade I diastolic dysfunction (impaired relaxation). Right Ventricle: The right ventricular size is normal. No increase in right ventricular wall thickness. Right ventricular systolic function is normal. There is normal pulmonary artery systolic pressure. The tricuspid regurgitant velocity is 2.66 m/s, and  with an assumed right atrial pressure of 3 mmHg, the estimated right ventricular systolic pressure is 23.5 mmHg. Left Atrium: Left atrial size was normal in  size. Right Atrium: Right atrial size was normal in size. Pericardium: There is no evidence of pericardial effusion. Mitral Valve: The mitral valve is normal in structure. Mild mitral annular calcification. Trivial mitral valve regurgitation. No evidence of mitral valve stenosis. Tricuspid Valve: The tricuspid valve is normal in structure. Tricuspid valve regurgitation is mild . No evidence of tricuspid stenosis. Aortic Valve: The aortic valve is tricuspid. There is mild calcification of the aortic valve. Aortic valve regurgitation is not visualized. No aortic stenosis is present. Pulmonic Valve: The pulmonic valve was grossly normal. Pulmonic valve regurgitation is trivial. No evidence of pulmonic stenosis. Aorta: Aortic dilatation noted. There is mild dilatation of the ascending aorta, measuring 40 mm. Venous: The inferior vena cava is normal in size with greater than 50% respiratory variability, suggesting right atrial pressure of 3 mmHg. IAS/Shunts: No atrial level shunt detected by color flow Doppler.  LEFT VENTRICLE PLAX 2D LVIDd:         4.10 cm  Diastology LVIDs:         3.20 cm  LV e' medial:    6.31 cm/s LV PW:          1.00 cm  LV E/e' medial:  11.2 LV IVS:        1.20 cm  LV e' lateral:   5.87 cm/s LVOT diam:     2.00 cm  LV E/e' lateral: 12.0 LV SV:         57 LV SV Index:   28 LVOT Area:     3.14 cm  LEFT ATRIUM             Index       RIGHT ATRIUM           Index LA diam:        3.90 cm 1.92 cm/m  RA Area:     15.80 cm LA Vol (A2C):   44.7 ml 21.99 ml/m RA Volume:   39.50 ml  19.44 ml/m LA Vol (A4C):   52.2 ml 25.68 ml/m LA Biplane Vol: 51.7 ml 25.44 ml/m  AORTIC VALVE LVOT Vmax:   101.00 cm/s LVOT Vmean:  66.900 cm/s LVOT VTI:    0.181 m  AORTA Ao Root diam: 3.80 cm Ao Asc diam:  4.00 cm MITRAL VALVE                TRICUSPID VALVE MV Area (PHT): 2.95 cm     TR Peak grad:   28.3 mmHg MV Decel Time: 257 msec     TR Vmax:        266.00 cm/s MV E velocity: 70.40 cm/s MV A velocity: 123.00 cm/s  SHUNTS MV E/A ratio:  0.57         Systemic VTI:  0.18 m                             Systemic Diam: 2.00 cm Weston Brass MD Electronically signed by Weston Brass MD Signature Date/Time: 07/09/2020/4:59:26 PM    Final    Sabino Dick, DO 07/09/2020, 5:38 PM PGY-1, [facility] FPTS Intern pager: [phone], text pages welcome  FPTS Upper-Level Resident Addendum   I have independently interviewed and examined the patient. I have discussed the above with the original author and agree with their documentation. My edits for correction/addition/clarification are in pink. Please see also any attending notes.   Shirlean Mylar, M.D. PGY-2, [facility] 07/09/2020 5:39  PM  FPTS Service pager: [phone] (text pages welcome through [facility])

## 2020-07-09 NOTE — ED Notes (Signed)
Attempted to call report

## 2020-07-09 NOTE — Progress Notes (Signed)
  Echocardiogram 2D Echocardiogram has been performed.  Gavin Dennis 07/09/2020, 4:51 PM

## 2020-07-09 NOTE — Progress Notes (Signed)
Spoke with floor nurse about bedside swallows. She stated that there was documentation it was passed, patient given heart healthy diet.   Jehad Bisono, DO

## 2020-07-09 NOTE — ED Notes (Signed)
Patient returned from MRI.

## 2020-07-10 ENCOUNTER — Observation Stay (HOSPITAL_COMMUNITY): Payer: Medicare Other

## 2020-07-10 DIAGNOSIS — Z8612 Personal history of poliomyelitis: Secondary | ICD-10-CM | POA: Diagnosis not present

## 2020-07-10 DIAGNOSIS — M48061 Spinal stenosis, lumbar region without neurogenic claudication: Secondary | ICD-10-CM | POA: Diagnosis present

## 2020-07-10 DIAGNOSIS — I6389 Other cerebral infarction: Secondary | ICD-10-CM | POA: Diagnosis present

## 2020-07-10 DIAGNOSIS — M4802 Spinal stenosis, cervical region: Secondary | ICD-10-CM | POA: Diagnosis present

## 2020-07-10 DIAGNOSIS — I6300 Cerebral infarction due to thrombosis of unspecified precerebral artery: Secondary | ICD-10-CM

## 2020-07-10 DIAGNOSIS — R296 Repeated falls: Secondary | ICD-10-CM | POA: Diagnosis present

## 2020-07-10 DIAGNOSIS — H539 Unspecified visual disturbance: Secondary | ICD-10-CM

## 2020-07-10 DIAGNOSIS — Z79899 Other long term (current) drug therapy: Secondary | ICD-10-CM | POA: Diagnosis not present

## 2020-07-10 DIAGNOSIS — K5900 Constipation, unspecified: Secondary | ICD-10-CM | POA: Diagnosis present

## 2020-07-10 DIAGNOSIS — K7010 Alcoholic hepatitis without ascites: Secondary | ICD-10-CM | POA: Diagnosis present

## 2020-07-10 DIAGNOSIS — E1142 Type 2 diabetes mellitus with diabetic polyneuropathy: Secondary | ICD-10-CM | POA: Diagnosis present

## 2020-07-10 DIAGNOSIS — M5412 Radiculopathy, cervical region: Secondary | ICD-10-CM | POA: Diagnosis present

## 2020-07-10 DIAGNOSIS — E538 Deficiency of other specified B group vitamins: Secondary | ICD-10-CM | POA: Diagnosis present

## 2020-07-10 DIAGNOSIS — U071 COVID-19: Secondary | ICD-10-CM | POA: Diagnosis present

## 2020-07-10 DIAGNOSIS — I639 Cerebral infarction, unspecified: Secondary | ICD-10-CM

## 2020-07-10 DIAGNOSIS — Z8673 Personal history of transient ischemic attack (TIA), and cerebral infarction without residual deficits: Secondary | ICD-10-CM | POA: Diagnosis present

## 2020-07-10 DIAGNOSIS — Z87891 Personal history of nicotine dependence: Secondary | ICD-10-CM | POA: Diagnosis not present

## 2020-07-10 DIAGNOSIS — I10 Essential (primary) hypertension: Secondary | ICD-10-CM | POA: Diagnosis present

## 2020-07-10 DIAGNOSIS — H02402 Unspecified ptosis of left eyelid: Secondary | ICD-10-CM | POA: Diagnosis present

## 2020-07-10 DIAGNOSIS — N179 Acute kidney failure, unspecified: Secondary | ICD-10-CM | POA: Diagnosis present

## 2020-07-10 DIAGNOSIS — R32 Unspecified urinary incontinence: Secondary | ICD-10-CM | POA: Diagnosis present

## 2020-07-10 DIAGNOSIS — H532 Diplopia: Secondary | ICD-10-CM | POA: Diagnosis present

## 2020-07-10 DIAGNOSIS — E785 Hyperlipidemia, unspecified: Secondary | ICD-10-CM | POA: Diagnosis present

## 2020-07-10 LAB — LIPID PANEL
Cholesterol: 135 mg/dL (ref 0–200)
HDL: 39 mg/dL — ABNORMAL LOW (ref >40)
LDL Cholesterol: 75 mg/dL (ref 0–99)
Total CHOL/HDL Ratio: 3.5 RATIO
Triglycerides: 106 mg/dL (ref <150)
VLDL: 21 mg/dL (ref 0–40)

## 2020-07-10 LAB — TSH: TSH: 1.812 u[IU]/mL (ref 0.350–4.500)

## 2020-07-10 LAB — HEMOGLOBIN A1C
Hgb A1c MFr Bld: 6.3 % — ABNORMAL HIGH (ref 4.8–5.6)
Mean Plasma Glucose: 134.11 mg/dL

## 2020-07-10 LAB — SARS CORONAVIRUS 2 (TAT 6-24 HRS): SARS Coronavirus 2: POSITIVE — AB

## 2020-07-10 IMAGING — CT CT ANGIO HEAD
1 of 11 series · 5 of 33 positions shown · IV contrast (OMNI 350)
Comparison: Head MRI and MRA [DATE]

CLINICAL DATA: Acute visual changes. Scattered punctate acute
embolic cerebral infarcts on head MRI yesterday.

EXAM:
CT ANGIOGRAPHY HEAD AND NECK
TECHNIQUE: Multidetector CT imaging of the head and neck was performed using
the standard protocol during bolus administration of intravenous
contrast. Multiplanar CT image reconstructions and MIPs were
obtained to evaluate the vascular anatomy. Carotid stenosis
measurements (when applicable) are obtained utilizing NASCET
criteria, using the distal internal carotid diameter as the
denominator.
CONTRAST:  75mL OMNIPAQUE IOHEXOL 350 MG/ML SOLN

[Series 11: cta neck axial · axial · 0.39mm/px · z∈[-314,-84]mm · 5 of 346 slices shown]
[im 58/346  soft-tissue]
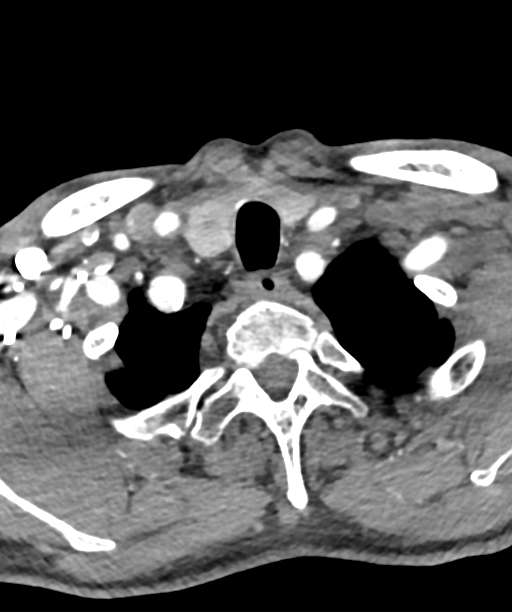
[im 116/346  bone]
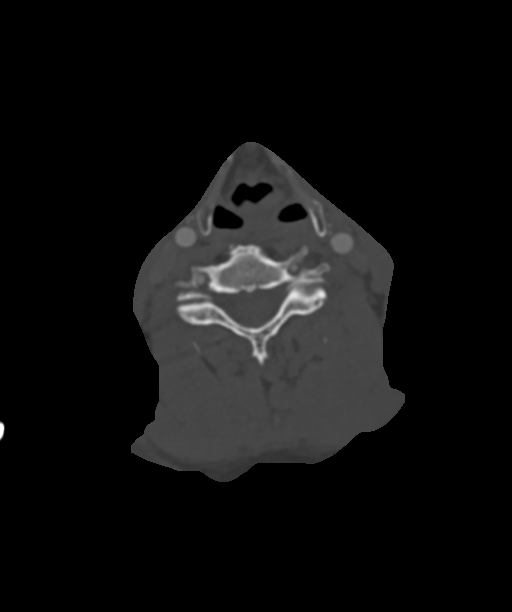
[im 173/346  soft-tissue]
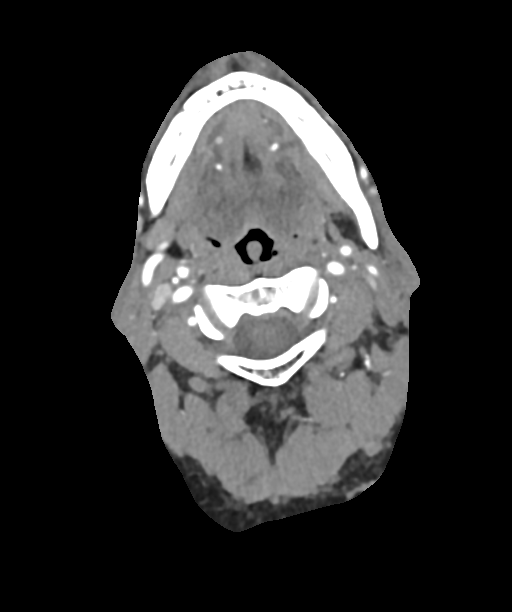
[im 231/346  bone]
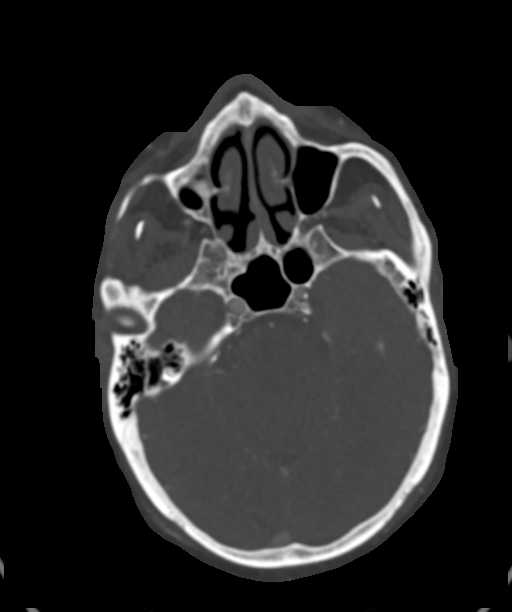
[im 288/346  soft-tissue]
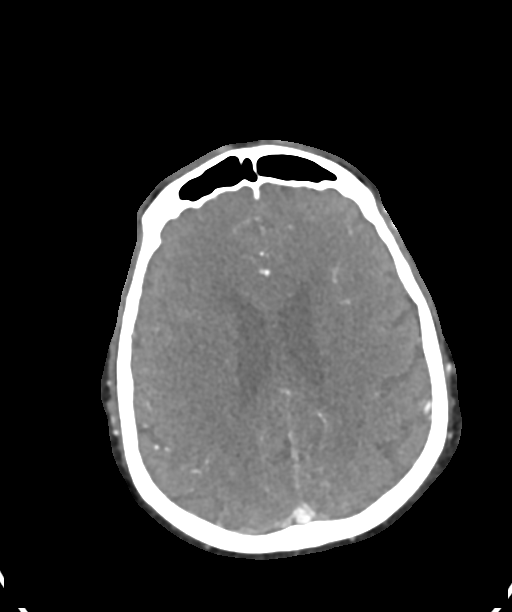

[5 of 33 positions shown; findings below may reference images not displayed]

FINDINGS: CT HEAD FINDINGS

Brain: The small acute cerebral infarcts on yesterday's MRI are not
well demonstrated by CT. There is a background of extensive chronic
small vessel ischemia in the cerebral white matter and deep gray
nuclei bilaterally. Mild cerebral atrophy is within normal limits
for age. A small chronic infarct is again noted in the right
cerebellum.

Vascular: Calcified atherosclerosis at the skull base.

Skull: No fracture or suspicious osseous lesion.

Sinuses: Small right maxillary sinus. No evidence of acute
inflammatory sinus disease. Clear mastoid air cells.

Orbits: Unremarkable.

Review of the MIP images confirms the above findings

CTA NECK FINDINGS

Aortic arch: Standard 3 vessel aortic arch with mild atherosclerotic
plaque. No arch vessel origin stenosis.

Right carotid system: Patent with mild calcified and soft plaque at
the carotid bifurcation. No evidence of significant stenosis or
dissection.

Left carotid system: Patent without evidence of stenosis,
dissection, or significant atherosclerosis.

Vertebral arteries: Patent without evidence of significant stenosis
or dissection. Moderately to strongly dominant right vertebral
artery.

Skeleton: Mild cervical spondylosis.

Other neck: Bilateral thyroid nodules measuring up to 1 cm which are
considered clinically insignificant and for which no imaging
follow-up is recommended.

Upper chest: Mild centrilobular emphysema.

Review of the MIP images confirms the above findings

CTA HEAD FINDINGS

Anterior circulation: The internal carotid arteries are patent from
skull base to carotid termini with mild-to-moderate atherosclerotic
plaque bilaterally not resulting in significant stenosis. ACAs and
MCAs are patent without evidence of a proximal branch occlusion or
significant proximal stenosis. No aneurysm is identified.

Posterior circulation: The intracranial vertebral arteries are
widely patent to the basilar. Patent bilateral PICA, left AICA, and
bilateral SCA origins are identified. The right AICA is small and
poorly evaluated. The basilar artery is widely patent. There are
large bilateral posterior communicating arteries with absent right
and hypoplastic left P1 segments. Both PCAs are patent without
evidence of a significant proximal stenosis. No aneurysm is
identified.

Venous sinuses: Patent.

Anatomic variants: Fetal PCAs.

Review of the MIP images confirms the above findings
IMPRESSION: 1. Mild atherosclerosis in the head and neck without large vessel
occlusion or significant proximal stenosis.
2. Aortic Atherosclerosis ([R9]-[R9]) and Emphysema ([R9]-[R9]).

## 2020-07-10 MED ORDER — SODIUM CHLORIDE 0.9 % IV SOLN
200.0000 mg | Freq: Once | INTRAVENOUS | Status: AC
  Administered 2020-07-10: 200 mg via INTRAVENOUS
  Filled 2020-07-10: qty 40

## 2020-07-10 MED ORDER — CYANOCOBALAMIN 1000 MCG PO TABS
1000.0000 ug | ORAL_TABLET | Freq: Every day | ORAL | 0 refills | Status: AC
Start: 2020-07-11 — End: 2020-08-10

## 2020-07-10 MED ORDER — SODIUM CHLORIDE 0.9 % IV SOLN: 100.0000 mg | Freq: Every day | INTRAVENOUS | Status: DC

## 2020-07-10 MED ORDER — CLOPIDOGREL BISULFATE 75 MG PO TABS
75.0000 mg | ORAL_TABLET | Freq: Every day | ORAL | Status: DC
  Administered 2020-07-10 – 2020-07-11 (×2): 75 mg via ORAL
  Filled 2020-07-10 (×2): qty 1

## 2020-07-10 MED ORDER — CLOPIDOGREL BISULFATE 75 MG PO TABS
75.0000 mg | ORAL_TABLET | Freq: Every day | ORAL | 0 refills | Status: AC
Start: 2020-07-11 — End: 2020-07-31

## 2020-07-10 MED ORDER — ATORVASTATIN CALCIUM 10 MG PO TABS
20.0000 mg | ORAL_TABLET | Freq: Every day | ORAL | Status: DC
Start: 2020-07-10 — End: 2020-07-12
  Administered 2020-07-10 – 2020-07-11 (×2): 20 mg via ORAL
  Filled 2020-07-10 (×2): qty 2

## 2020-07-10 MED ORDER — ASPIRIN 81 MG PO TBEC: 81.0000 mg | DELAYED_RELEASE_TABLET | Freq: Every day | ORAL | 0 refills | Status: DC

## 2020-07-10 MED ORDER — IOHEXOL 350 MG/ML SOLN
75.0000 mL | Freq: Once | INTRAVENOUS | Status: AC | PRN
  Administered 2020-07-10: 75 mL via INTRAVENOUS

## 2020-07-10 MED ORDER — VITAMIN B-12 1000 MCG PO TABS
1000.0000 ug | ORAL_TABLET | Freq: Every day | ORAL | Status: DC
  Administered 2020-07-10 – 2020-07-11 (×2): 1000 ug via ORAL
  Filled 2020-07-10 (×2): qty 1

## 2020-07-10 MED ORDER — ATORVASTATIN CALCIUM 20 MG PO TABS
20.0000 mg | ORAL_TABLET | Freq: Every day | ORAL | 0 refills | Status: DC
Start: 2020-07-11 — End: 2020-09-20

## 2020-07-10 MED ORDER — THIAMINE HCL 100 MG PO TABS: 100.0000 mg | ORAL_TABLET | Freq: Every day | ORAL | 0 refills | Status: AC

## 2020-07-10 MED ORDER — FOLIC ACID 1 MG PO TABS
1.0000 mg | ORAL_TABLET | Freq: Every day | ORAL | 0 refills | Status: AC
Start: 2020-07-11 — End: 2020-08-10

## 2020-07-10 NOTE — Progress Notes (Signed)
Pt would like to go home today. His last drink was >1 month ago. He also wants a Remdesivir infusion. Will order loading dose and then order outpatient covid treatment. He will arrange follow up appointment with PCP next week and neurology will contact him regarding appointment in 6 weeks.   Lattie Haw MD [employer]

## 2020-07-10 NOTE — Evaluation (Signed)
Occupational Therapy Evaluation Patient Details Name: Gavin Dennis MRN: [id number] DOB: 04/17/1951 Today's Date: 07/10/2020    History of Present Illness 70 yo man presents with diplopia and blurry vision symptoms x2 days. MRI brain shows numerous punctate acute infarctions in both cerebral hemispheres. T spine negative, L spine mild stenosis, C spine C3-4, C5-6 disc degeneration. Tested positive for COVID on arrival. PMH is significant for HTN, polysubstance abuse, polio.   Clinical Impression   Pt PTA: Pt living with son/family who can assist pt nearly all day. Pt reports independence with ADL and mobility at home. Pt currently, modified independence to perform each task with 1 eye closed or use of patch (taped glasses for next session). Pt ambulating in room with supervisionA overall with no AD due to pt's only focal deficit is blurry/diplopia overall. Pt reports clear vision with either single eye open, with both open pt experiences diplopia. Pt would benefit from continued OT skilled services for ADL and vision strategies. (pt given eye patch on door and alerted RN and NT to retrieve the next time they went in his room). Pt would benefit from continued OT skilled services. OT following acutely.      Follow Up Recommendations  Outpatient OT (for double vision (neuro OP OT if possible())    Equipment Recommendations  None recommended by OT    Recommendations for Other Services       Precautions / Restrictions Precautions Precautions: Fall Restrictions Weight Bearing Restrictions: No      Mobility Bed Mobility Overal bed mobility: Modified Independent                  Transfers Overall transfer level: Needs assistance Equipment used: None Transfers: Sit to/from Stand Sit to Stand: Supervision         General transfer comment: Increased time, no physical assist    Balance Overall balance assessment: Needs assistance Sitting-balance support: Bilateral upper  extremity supported;Feet supported Sitting balance-Leahy Scale: Good     Standing balance support: No upper extremity supported Standing balance-Leahy Scale: Fair                             ADL either performed or assessed with clinical judgement   ADL Overall ADL's : At baseline                                       General ADL Comments: modified independence to perform each task with 1 eye closed or use of patch (taped glasses for next session) Pt ambulating in room with supervisionA overall due to pt's only focal deficit is blurry/diplopia overall. Pt reports clear vision with either single eye open, with both open pt experiences diplopia.     Vision Baseline Vision/History: No visual deficits Patient Visual Report: Diplopia Vision Assessment?: Yes Eye Alignment: Within Functional Limits Ocular Range of Motion: Within Functional Limits Alignment/Gaze Preference: Within Defined Limits Tracking/Visual Pursuits: Other (comment);Impaired - to be further tested in functional context (double vision)     Perception     Praxis      Pertinent Vitals/Pain Pain Assessment: No/denies pain     Hand Dominance Right   Extremity/Trunk Assessment Upper Extremity Assessment Upper Extremity Assessment: Overall WFL for tasks assessed   Lower Extremity Assessment Lower Extremity Assessment: Overall WFL for tasks assessed   Cervical / Trunk Assessment Cervical / Trunk  Assessment: Normal   Communication Communication Communication: No difficulties   Cognition Arousal/Alertness: Awake/alert Behavior During Therapy: WFL for tasks assessed/performed Overall Cognitive Status: Within Functional Limits for tasks assessed                                     General Comments  VSS on RA; RN and NT aware of eye patch hanging from pt's door to give to pt as OTR did not at the time of eval.    Exercises     Shoulder Instructions      Home  Living Family/patient expects to be discharged to:: Private residence Living Arrangements: Children Available Help at Discharge: Family;Available 24 hours/day Type of Home: House Home Access: Stairs to enter [employer] of Steps: 4 Entrance Stairs-Rails: Right;Can reach both;Left Home Layout: One level     Bathroom Shower/Tub: Teacher, early years/pre: Standard     Home Equipment: None          Prior Functioning/Environment Level of Independence: Independent        Comments: retired, patient does not drive        OT Problem List: Impaired vision/perception;Impaired balance (sitting and/or standing)      OT Treatment/Interventions: Therapeutic activities;Visual/perceptual remediation/compensation;Self-care/ADL training;Therapeutic exercise;Patient/family education;Neuromuscular education    OT Goals(Current goals can be found in the care plan section) Acute Rehab OT Goals Patient Stated Goal: to get rid of double vision OT Goal Formulation: With patient Time For Goal Achievement: 07/24/20 Potential to Achieve Goals: Good ADL Goals Additional ADL Goal #1: Pt will tolerate wearing taped glasses or eye patch x1 hour. Additional ADL Goal #2: Pt will participate in visual scanning/perceptual vision exercises to increase ability to utilize compensatory strategies for safety.  OT Frequency: Min 2X/week   Barriers to D/C:            Co-evaluation              AM-PAC OT "6 Clicks" Daily Activity     Outcome Measure Help from another person eating meals?: None Help from another person taking care of personal grooming?: None Help from another person toileting, which includes using toliet, bedpan, or urinal?: None Help from another person bathing (including washing, rinsing, drying)?: A Little Help from another person to put on and taking off regular upper body clothing?: None Help from another person to put on and taking off regular lower body  clothing?: None 6 Click Score: 23   End of Session Equipment Utilized During Treatment: Gait belt Nurse Communication: Mobility status  Activity Tolerance: Patient tolerated treatment well Patient left: in chair;with call bell/phone within reach;with chair alarm set  OT Visit Diagnosis: Unsteadiness on feet (R26.81);Low vision, both eyes (H54.2)                Time: 4696-2952 OT Time Calculation (min): 23 min Charges:  OT General Charges $OT Visit: 1 Visit OT Evaluation $OT Eval Moderate Complexity: 1 Mod OT Treatments $Self Care/Home Management : 8-22 mins  Jefferey Pica, OTR/L Acute Rehabilitation Services Pager: [phone] Office: [phone]  Ona Rathert C 07/10/2020, 1:51 PM

## 2020-07-10 NOTE — Discharge Instructions (Addendum)
Gavin Dennis, Kowalewski were admitted with a stroke.  We did extensive imaging which did show evidence of multiple small stroke.  You will be discharged on aspirin indefinitely, Plavix for the next 3 weeks.  Stop taking Plavix after the next 3 weeks.  Please also take atorvastatin, vitamin W-44, folic acid and thiamine.  For your Covid you will be contacted by the Covid treatment center for further remdesivir doses.  You did receive 1 dose before you were discharged today.  You should isolate for the next 10 days.   Please follow-up with your PCP in the next 1 week.  Neurology will contact you regarding a follow-up for 6 weeks time.  If you have not heard from them in the next 1 week please call them at [facility] on [phone] to schedule an appointment.  Best wishes,  Please do not drive until you are seen by neurology.  Dr. Posey Pronto

## 2020-07-10 NOTE — Progress Notes (Addendum)
Family Medicine Teaching Service Daily Progress Note Intern Pager: [phone]  Patient name: Gavin Dennis Medical record number: [id number] Date of birth: 1950/08/11 Age: 70 y.o. Gender: male  Primary Care Provider: Pcp, No Consultants:  Code Status: FULL   Pt Overview and Major Events to Date:  Gavin Dennis is a 70 y.o. male presenting with diplopia and blurry vision with acute infarcts on MR brain. PMH is significant for HTN, polysubstance abuse.    Assessment and Plan:  Stroke: acute CN exam: positive for diplopia, sluggish pupils bilaterally, ptosis of left eye, normal visual fields and rest of exam. 5/5 strength upper and lower extremities bilaterally  CT head on admission-age-related atrophy and microvascular ischemic changes. MRI brain: Multiple punctate infarctions in cerebral hemispheres with microembolic infarctions from heart or a sending aorta. MRA brain: Negative Echo on 3/11: EF 55 to 60%, mild LVH, grade 1 diastolic dysfunction.  Risk stratification labs: HDL 39, LDL 75, A1c 6.3, TSH 1.81 -Vitals per floor protocol -Up with assistance  -Neuro following; appreciate recs -ASA 81 mg daily -Allow for permissive hypertension <220/110 - PT/OT -DVT ppx Lovenox   Covid positive, new For incidentally on admission screening.  Denies shortness of breath or cough.  Sats 100% on air with normal pulmonary exam.  Patient may be candidate for Mab infusion due to several risk factors and age. Pt denies [place] contacts. He has had 2 x covid vaccines and boosted. -Monitor respiratory status -Possible MAB infusion in hospital  Hypertension: chronic, uncontrolled SBP 126-185, DBP 99-129 Home meds: Amlodipine 10 mg daily, HCTZ 25 mg daily -Holding home meds in setting of CVA -Allow for permissive hypertension <220/110 70    Cervical Radiculopathy  Neuropathy  Hx Urinary Incontinence MR cervical-lumbar spine-spinal stenosis from C3-C4, C5-C6 severe multilevel neural foraminal  stenosis, lower thoracic spondylosis with mild spinal stenosis at T10-11, severe L4-L5 facet arthrosis, mild spinal stenosis at L2-L3. No evidence of cord compression B12 142, folate 12.8 -Neurology consulted, appreciate recommendations -Methylmalonic acid pending  -Protein electrophoresis pending  -Consider PSA; shared decision making with patient    Alcoholic hepatitis Likely alcoholic hepatitis in the setting of drinking 1.5 L of wine a night.  AST 37, ALT 83 this morning improving from yesterday -Monitor with CMP -CIWA -Alcohol cessation counseling with TOC   AKI Cr 1.15, improved from 1.38 yesterday -Avoid nephrotoxic agents -NS at 100 mL/hr -Continue to monitor with BMP   Hx Tobacco Abuse Cocaine Abuse  Alcohol Abuse CIWA's overnight 0. -CIWA, PRN Ativan if needed -Offer Nicotine patch  -Folic acid 1 mg daily -Multivitamin daily -Thiamine 100 mg daily   FEN/GI: heart healthy diet  PPx: Lovenox   Status is: Observation  Dispo: The patient is from: Home              Anticipated d/c is to: Home              Patient currently is not medically stable to d/c.   Difficult to place patient No   Subjective:  No acute events overnight.  Objective: Temp:  [98.1 F (36.7 C)-98.7 F (37.1 C)] 98.7 F (37.1 C) (03/12 0744) Pulse Rate:  [70-85] 74 (03/12 0744) Resp:  [16-25] 23 (03/12 0744) BP: (126-185)/(97-136) 126/99 (03/12 0744) SpO2:  [96 %-100 %] 96 % (03/12 0744) Weight:  [79 kg] 79 kg (03/11 2108)   Physical Exam: General: Alert, no acute distress, pleasant Cardio: Normal S1 and S2, RRR, no r/m/g Pulm: CTAB, normal work of breathing Abdomen: Bowel sounds  normal. Abdomen soft and non-tender.  Extremities: No peripheral edema.  Neuro: CN exam- positive for diplopia, sluggish pupils bilaterally, ptosis of left eye, normal visual fields and rest of exam. 5/5 strength upper and lower extremities bilaterally   Laboratory: Recent Labs  Lab 07/09/20 1143  07/09/20 2149  WBC 5.5 6.1  HGB 16.3 16.0  HCT 47.6 47.0  PLT 229 215   Recent Labs  Lab 07/09/20 1143 07/09/20 2149  NA 138 138  K 3.8 3.8  CL 106 105  CO2 27 27  BUN 25* 23  CREATININE 1.38* 1.15  CALCIUM 10.2 10.2  PROT 7.1 6.8  BILITOT 0.9 0.9  ALKPHOS 112 109  ALT 94* 83*  AST 46* 37  GLUCOSE 157* 98     Imaging/Diagnostic Tests: MR ANGIO HEAD WO CONTRAST  Result Date: 07/09/2020 CLINICAL DATA:  Acute visual changes. Scattered punctate acute embolic cerebral infarcts on head MRI earlier today. EXAM: MRA HEAD WITHOUT CONTRAST TECHNIQUE: Angiographic images of the Circle of Willis were obtained using MRA technique without intravenous contrast. COMPARISON:  None. FINDINGS: The visualized distal vertebral arteries are patent to the basilar with the right being dominant. Patent bilateral PICA, left AICA, and bilateral SCA origins are visualized. The right AICA is small and poorly visualized. The basilar artery is widely patent. There are large posterior communicating arteries bilaterally with absent right and hypoplastic left P1 segments. Both PCAs are patent without evidence of a significant proximal stenosis. The internal carotid arteries are widely patent from skull base to carotid termini. ACAs and MCAs are patent without evidence of a proximal branch occlusion or significant proximal stenosis. No aneurysm is identified. IMPRESSION: Negative head MRA. Electronically Signed   By: Logan Bores M.D.   On: 07/09/2020 18:35   MR BRAIN WO CONTRAST  Result Date: 07/09/2020 CLINICAL DATA:  Blurred vision.  Double vision.  Hypertension. EXAM: MRI HEAD WITHOUT CONTRAST TECHNIQUE: Multiplanar, multiecho pulse sequences of the brain and surrounding structures were obtained without intravenous contrast. COMPARISON:  Head CT 06/04/2020 FINDINGS: Brain: There are chronic small-vessel ischemic changes of the pons and there are old small vessel cerebellar infarctions. Question punctate acute  infarction in the left paramedian mid brain. Single punctate acute infarction in the right parietooccipital junction region. Second punctate acute infarction at the right posterior frontal vertex. In the left hemisphere, there are more numerous punctate acute infarctions including 2 or 3 at the temporal/parietal/occipital junction and 1 within the deep radiating white matter tracts. That could potentially be a tiny cortical infarction in the left frontal lobe. This pattern of small acute infarctions suggests embolic disease from the heart or ascending aorta. No large vessel territory stroke. Cerebral hemispheres otherwise show moderate chronic small-vessel disease of the thalami, basal ganglia and hemispheric white matter. Vascular: Major vessels at the base of the brain show flow. Skull and upper cervical spine: Negative Sinuses/Orbits: Clear/normal Other: None IMPRESSION: 1. Background pattern of chronic small-vessel ischemic changes throughout the brain. 2. Numerous (5-7) punctate acute infarctions in both cerebral hemispheres consistent with micro embolic infarctions from the heart or ascending aorta. No large confluent infarction. Electronically Signed   By: Nelson Chimes M.D.   On: 07/09/2020 13:31   MR CERVICAL SPINE WO CONTRAST  Result Date: 07/09/2020 CLINICAL DATA:  Left foot tingling, urinary urgency, and incomplete bladder imaging with positive Lhermitte's sign. EXAM: MRI CERVICAL SPINE WITHOUT CONTRAST TECHNIQUE: Multiplanar, multisequence MR imaging of the cervical spine was performed. No intravenous contrast was administered. COMPARISON:  Soft  tissue neck CT 09/22/2010 FINDINGS: Alignment: Normal. Vertebrae: No fracture or suspicious osseous lesion. Degenerative endplate changes at U2-7 and C5-6 including mild edema. Cord: Normal signal and morphology. Posterior Fossa, vertebral arteries, paraspinal tissues: Unremarkable. Disc levels: C2-3: Mild disc bulging and left uncovertebral spurring result  in mild left neural foraminal stenosis without spinal stenosis. C3-4: Disc bulging, a central disc protrusion, left uncovertebral spurring, and mild facet arthrosis result in mild spinal stenosis and moderate to severe left neural foraminal stenosis. C4-5: Mild disc space narrowing. Disc bulging, uncovertebral spurring, and mild facet arthrosis result in mild spinal stenosis and mild right and severe left neural foraminal stenosis. C5-6: Moderate disc space narrowing. Disc bulging and asymmetric right uncovertebral spurring result in mild spinal stenosis and severe right and moderate left neural foraminal stenosis. C6-7: A shallow right paracentral to right foraminal disc protrusion results in mild right neural foraminal stenosis without spinal stenosis. C7-T1: Minimal disc bulging and moderate left facet arthrosis without stenosis. IMPRESSION: 1. Multilevel cervical disc degeneration resulting in mild spinal stenosis from C3-4 to C5-6. 2. Severe multilevel neural foraminal stenosis as above. Electronically Signed   By: Logan Bores M.D.   On: 07/09/2020 19:19   MR THORACIC SPINE WO CONTRAST  Result Date: 07/09/2020 CLINICAL DATA:  Left foot tingling, urinary urgency, and incomplete bladder imaging with positive Lhermitte's sign. EXAM: MRI THORACIC SPINE WITHOUT CONTRAST TECHNIQUE: Multiplanar, multisequence MR imaging of the thoracic spine was performed. No intravenous contrast was administered. COMPARISON:  None. FINDINGS: Alignment:  Normal. Vertebrae: No fracture, suspicious osseous lesion, or significant marrow edema. Scattered small vertebral hemangiomas. Cord:  Normal signal and morphology. Paraspinal and other soft tissues: Subcentimeter right thyroid nodules for which no imaging follow-up is recommended. Disc levels: Mild disc bulging in significant stenosis. At T9-10 does not result disc bulging and mild posterior element hypertrophy at T10-11 results in mild spinal stenosis without cord compression.  The neural foramina appear patent throughout. IMPRESSION: 1. Mild lower thoracic spondylosis with mild spinal stenosis at T10-11. 2. Normal appearance of the thoracic spinal cord. Electronically Signed   By: Logan Bores M.D.   On: 07/09/2020 19:26   MR LUMBAR SPINE WO CONTRAST  Result Date: 07/09/2020 CLINICAL DATA:  Left foot tingling, urinary urgency, and incomplete bladder imaging with positive Lhermitte's sign. EXAM: MRI LUMBAR SPINE WITHOUT CONTRAST TECHNIQUE: Multiplanar, multisequence MR imaging of the lumbar spine was performed. No intravenous contrast was administered. COMPARISON:  None. FINDINGS: Segmentation: Standard. Alignment: Trace retrolisthesis of L3 on L4. 3 mm anterolisthesis of L4 on L5. Vertebrae: No fracture or suspicious osseous lesion. Multilevel chronic degenerative endplate changes. Prominent right greater than left facet edema at L4-5. Conus medullaris and cauda equina: Conus extends to the L2 level. Conus and cauda equina appear normal. Paraspinal and other soft tissues: Bilateral renal cysts measuring up to 3.9 cm. Colonic diverticulosis. Disc levels: Disc desiccation and mild disc space narrowing from L2-3 to L4-5. L1-2: Negative. L2-3: Disc bulging, prominent dorsal epidural fat, and moderate facet hypertrophy result in mild spinal stenosis, mild left lateral recess stenosis, and mild left neural foraminal stenosis. L3-4: Disc bulging eccentric to the left and mild facet hypertrophy result in mild bilateral lateral recess stenosis and mild left greater than right neural foraminal stenosis without significant spinal stenosis. L4-5: Anterolisthesis with right eccentric bulging of uncovered disc, mild ligamentum flavum hypertrophy, and severe facet hypertrophy result in mild spinal stenosis, moderate right and mild left lateral recess stenosis, and severe right and mild-to-moderate left neural  foraminal stenosis. Potential right L4 nerve root impingement. Small facet joint effusions.  L5-S1: Normal disc.  Moderate facet hypertrophy without stenosis. IMPRESSION: 1. Severe L4-5 facet arthrosis with facet edema, grade 1 anterolisthesis, mild spinal stenosis, and severe right neural foraminal stenosis. 2. Mild spinal stenosis at L2-3. Electronically Signed   By: Logan Bores M.D.   On: 07/09/2020 19:32   ECHOCARDIOGRAM COMPLETE  Result Date: 07/09/2020    ECHOCARDIOGRAM REPORT   Patient Name:   Gavin Dennis Date of Exam: 07/09/2020 Medical Rec #:  [id number]    Height:       71.0 in Accession #:    [id number]   Weight:       183.4 lb Date of Birth:  01/18/1951    BSA:          2.032 m Patient Age:    43 years     BP:           161/136 mmHg Patient Gender: M            HR:           73 bpm. Exam Location:  Inpatient Procedure: 2D Echo Indications:    stroke  History:        Patient has no prior history of Echocardiogram examinations.                 Risk Factors:Hypertension.  Sonographer:    Johny Chess Referring Phys: [id number] [place]  1. Left ventricular ejection fraction, by estimation, is 55 to 60%. The left ventricle has normal function. The left ventricle has no regional wall motion abnormalities. There is mild left ventricular hypertrophy. Left ventricular diastolic parameters are consistent with Grade I diastolic dysfunction (impaired relaxation).  2. Right ventricular systolic function is normal. The right ventricular size is normal. There is normal pulmonary artery systolic pressure. The estimated right ventricular systolic pressure is 27.0 mmHg.  3. The mitral valve is normal in structure. Trivial mitral valve regurgitation. No evidence of mitral stenosis.  4. The aortic valve is tricuspid. There is mild calcification of the aortic valve. Aortic valve regurgitation is not visualized. No aortic stenosis is present.  5. Aortic dilatation noted. There is mild dilatation of the ascending aorta, measuring 40 mm.  6. The inferior vena cava is normal in size with greater  than 50% respiratory variability, suggesting right atrial pressure of 3 mmHg. Conclusion(s)/Recommendation(s): No intracardiac source of embolism detected on this transthoracic study. A transesophageal echocardiogram is recommended to exclude cardiac source of embolism if clinically indicated. FINDINGS  Left Ventricle: Left ventricular ejection fraction, by estimation, is 55 to 60%. The left ventricle has normal function. The left ventricle has no regional wall motion abnormalities. The left ventricular internal cavity size was normal in size. There is  mild left ventricular hypertrophy. Left ventricular diastolic parameters are consistent with Grade I diastolic dysfunction (impaired relaxation). Right Ventricle: The right ventricular size is normal. No increase in right ventricular wall thickness. Right ventricular systolic function is normal. There is normal pulmonary artery systolic pressure. The tricuspid regurgitant velocity is 2.66 m/s, and  with an assumed right atrial pressure of 3 mmHg, the estimated right ventricular systolic pressure is 35.0 mmHg. Left Atrium: Left atrial size was normal in size. Right Atrium: Right atrial size was normal in size. Pericardium: There is no evidence of pericardial effusion. Mitral Valve: The mitral valve is normal in structure. Mild mitral annular calcification. Trivial mitral valve regurgitation. No evidence of mitral valve  stenosis. Tricuspid Valve: The tricuspid valve is normal in structure. Tricuspid valve regurgitation is mild . No evidence of tricuspid stenosis. Aortic Valve: The aortic valve is tricuspid. There is mild calcification of the aortic valve. Aortic valve regurgitation is not visualized. No aortic stenosis is present. Pulmonic Valve: The pulmonic valve was grossly normal. Pulmonic valve regurgitation is trivial. No evidence of pulmonic stenosis. Aorta: Aortic dilatation noted. There is mild dilatation of the ascending aorta, measuring 40 mm. Venous: The  inferior vena cava is normal in size with greater than 50% respiratory variability, suggesting right atrial pressure of 3 mmHg. IAS/Shunts: No atrial level shunt detected by color flow Doppler.  LEFT VENTRICLE PLAX 2D LVIDd:         4.10 cm  Diastology LVIDs:         3.20 cm  LV e' medial:    6.31 cm/s LV PW:         1.00 cm  LV E/e' medial:  11.2 LV IVS:        1.20 cm  LV e' lateral:   5.87 cm/s LVOT diam:     2.00 cm  LV E/e' lateral: 12.0 LV SV:         57 LV SV Index:   28 LVOT Area:     3.14 cm  LEFT ATRIUM             Index       RIGHT ATRIUM           Index LA diam:        3.90 cm 1.92 cm/m  RA Area:     15.80 cm LA Vol (A2C):   44.7 ml 21.99 ml/m RA Volume:   39.50 ml  19.44 ml/m LA Vol (A4C):   52.2 ml 25.68 ml/m LA Biplane Vol: 51.7 ml 25.44 ml/m  AORTIC VALVE LVOT Vmax:   101.00 cm/s LVOT Vmean:  66.900 cm/s LVOT VTI:    0.181 m  AORTA Ao Root diam: 3.80 cm Ao Asc diam:  4.00 cm MITRAL VALVE                TRICUSPID VALVE MV Area (PHT): 2.95 cm     TR Peak grad:   28.3 mmHg MV Decel Time: 257 msec     TR Vmax:        266.00 cm/s MV E velocity: 70.40 cm/s MV A velocity: 123.00 cm/s  SHUNTS MV E/A ratio:  0.57         Systemic VTI:  0.18 m                             Systemic Diam: 2.00 cm Cherlynn Kaiser MD Electronically signed by Cherlynn Kaiser MD Signature Date/Time: 07/09/2020/4:59:26 PM    Final     Lattie Haw, MD 07/10/2020, 11:23 AM PGY-2 [place] Intern pager: [phone], text pages welcome

## 2020-07-10 NOTE — Progress Notes (Addendum)
STROKE TEAM PROGRESS NOTE   INTERVAL HISTORY He reports that he has not seen a doctor in decades but recently started seeing one. On the 8th he went to his PCP and when he woke up on the 9th he had vision changes and difficulty judging distances. Because these symptoms did not go away he came to the ED. He reports that today he is doing well. He reports occasionally seeing double and still has some haziness in his vision but otherwise has no other symptoms.   Currently only has some visual disturbances but otherwise reports return to baseline. Will start dual antiplatelet therapy for 3 weeks then only Aspirin from then on. Will start Atorvastatin.  Family Medicine is Primary.  Vitals:   07/09/20 2313 07/10/20 0305 07/10/20 0310 07/10/20 0744  BP: (!) 144/99  (!) 141/97 (!) 126/99  Pulse: 75 70 73 74  Resp: 20  16 (!) 23  Temp: 98.4 F (36.9 C)  98.2 F (36.8 C) 98.7 F (37.1 C)  TempSrc: Oral Oral Oral Oral  SpO2: 97%  100% 96%  Weight:      Height:       CBC:  Recent Labs  Lab 07/09/20 1143 07/09/20 2149  WBC 5.5 6.1  NEUTROABS 2.5  --   HGB 16.3 16.0  HCT 47.6 47.0  MCV 91.9 91.4  PLT 229 914   Basic Metabolic Panel:  Recent Labs  Lab 07/09/20 1143 07/09/20 2149  NA 138 138  K 3.8 3.8  CL 106 105  CO2 27 27  GLUCOSE 157* 98  BUN 25* 23  CREATININE 1.38* 1.15  CALCIUM 10.2 10.2  MG  --  1.6*  PHOS  --  3.3   Lipid Panel:  Recent Labs  Lab 07/10/20 0314  CHOL 135  TRIG 106  HDL 39*  CHOLHDL 3.5  VLDL 21  LDLCALC 75   HgbA1c:  Recent Labs  Lab 07/10/20 0314  HGBA1C 6.3*   Urine Drug Screen: No results for input(s): LABOPIA, COCAINSCRNUR, LABBENZ, AMPHETMU, THCU, LABBARB in the last 168 hours.  Alcohol Level No results for input(s): ETH in the last 168 hours.  IMAGING past 24 hours MR ANGIO HEAD WO CONTRAST  Result Date: 07/09/2020 CLINICAL DATA:  Acute visual changes. Scattered punctate acute embolic cerebral infarcts on head MRI earlier today.  EXAM: MRA HEAD WITHOUT CONTRAST TECHNIQUE: Angiographic images of the Circle of Willis were obtained using MRA technique without intravenous contrast. COMPARISON:  None. FINDINGS: The visualized distal vertebral arteries are patent to the basilar with the right being dominant. Patent bilateral PICA, left AICA, and bilateral SCA origins are visualized. The right AICA is small and poorly visualized. The basilar artery is widely patent. There are large posterior communicating arteries bilaterally with absent right and hypoplastic left P1 segments. Both PCAs are patent without evidence of a significant proximal stenosis. The internal carotid arteries are widely patent from skull base to carotid termini. ACAs and MCAs are patent without evidence of a proximal branch occlusion or significant proximal stenosis. No aneurysm is identified. IMPRESSION: Negative head MRA. Electronically Signed   By: Logan Bores M.D.   On: 07/09/2020 18:35   MR BRAIN WO CONTRAST  Result Date: 07/09/2020 CLINICAL DATA:  Blurred vision.  Double vision.  Hypertension. EXAM: MRI HEAD WITHOUT CONTRAST TECHNIQUE: Multiplanar, multiecho pulse sequences of the brain and surrounding structures were obtained without intravenous contrast. COMPARISON:  Head CT 06/04/2020 FINDINGS: Brain: There are chronic small-vessel ischemic changes of the pons and there  are old small vessel cerebellar infarctions. Question punctate acute infarction in the left paramedian mid brain. Single punctate acute infarction in the right parietooccipital junction region. Second punctate acute infarction at the right posterior frontal vertex. In the left hemisphere, there are more numerous punctate acute infarctions including 2 or 3 at the temporal/parietal/occipital junction and 1 within the deep radiating white matter tracts. That could potentially be a tiny cortical infarction in the left frontal lobe. This pattern of small acute infarctions suggests embolic disease from  the heart or ascending aorta. No large vessel territory stroke. Cerebral hemispheres otherwise show moderate chronic small-vessel disease of the thalami, basal ganglia and hemispheric white matter. Vascular: Major vessels at the base of the brain show flow. Skull and upper cervical spine: Negative Sinuses/Orbits: Clear/normal Other: None IMPRESSION: 1. Background pattern of chronic small-vessel ischemic changes throughout the brain. 2. Numerous (5-7) punctate acute infarctions in both cerebral hemispheres consistent with micro embolic infarctions from the heart or ascending aorta. No large confluent infarction. Electronically Signed   By: Nelson Chimes M.D.   On: 07/09/2020 13:31   MR CERVICAL SPINE WO CONTRAST  Result Date: 07/09/2020 CLINICAL DATA:  Left foot tingling, urinary urgency, and incomplete bladder imaging with positive Lhermitte's sign. EXAM: MRI CERVICAL SPINE WITHOUT CONTRAST TECHNIQUE: Multiplanar, multisequence MR imaging of the cervical spine was performed. No intravenous contrast was administered. COMPARISON:  Soft tissue neck CT 09/22/2010 FINDINGS: Alignment: Normal. Vertebrae: No fracture or suspicious osseous lesion. Degenerative endplate changes at T5-5 and C5-6 including mild edema. Cord: Normal signal and morphology. Posterior Fossa, vertebral arteries, paraspinal tissues: Unremarkable. Disc levels: C2-3: Mild disc bulging and left uncovertebral spurring result in mild left neural foraminal stenosis without spinal stenosis. C3-4: Disc bulging, a central disc protrusion, left uncovertebral spurring, and mild facet arthrosis result in mild spinal stenosis and moderate to severe left neural foraminal stenosis. C4-5: Mild disc space narrowing. Disc bulging, uncovertebral spurring, and mild facet arthrosis result in mild spinal stenosis and mild right and severe left neural foraminal stenosis. C5-6: Moderate disc space narrowing. Disc bulging and asymmetric right uncovertebral spurring result  in mild spinal stenosis and severe right and moderate left neural foraminal stenosis. C6-7: A shallow right paracentral to right foraminal disc protrusion results in mild right neural foraminal stenosis without spinal stenosis. C7-T1: Minimal disc bulging and moderate left facet arthrosis without stenosis. IMPRESSION: 1. Multilevel cervical disc degeneration resulting in mild spinal stenosis from C3-4 to C5-6. 2. Severe multilevel neural foraminal stenosis as above. Electronically Signed   By: Logan Bores M.D.   On: 07/09/2020 19:19   MR THORACIC SPINE WO CONTRAST  Result Date: 07/09/2020 CLINICAL DATA:  Left foot tingling, urinary urgency, and incomplete bladder imaging with positive Lhermitte's sign. EXAM: MRI THORACIC SPINE WITHOUT CONTRAST TECHNIQUE: Multiplanar, multisequence MR imaging of the thoracic spine was performed. No intravenous contrast was administered. COMPARISON:  None. FINDINGS: Alignment:  Normal. Vertebrae: No fracture, suspicious osseous lesion, or significant marrow edema. Scattered small vertebral hemangiomas. Cord:  Normal signal and morphology. Paraspinal and other soft tissues: Subcentimeter right thyroid nodules for which no imaging follow-up is recommended. Disc levels: Mild disc bulging in significant stenosis. At T9-10 does not result disc bulging and mild posterior element hypertrophy at T10-11 results in mild spinal stenosis without cord compression. The neural foramina appear patent throughout. IMPRESSION: 1. Mild lower thoracic spondylosis with mild spinal stenosis at T10-11. 2. Normal appearance of the thoracic spinal cord. Electronically Signed   By: Logan Bores  M.D.   On: 07/09/2020 19:26   MR LUMBAR SPINE WO CONTRAST  Result Date: 07/09/2020 CLINICAL DATA:  Left foot tingling, urinary urgency, and incomplete bladder imaging with positive Lhermitte's sign. EXAM: MRI LUMBAR SPINE WITHOUT CONTRAST TECHNIQUE: Multiplanar, multisequence MR imaging of the lumbar spine was  performed. No intravenous contrast was administered. COMPARISON:  None. FINDINGS: Segmentation: Standard. Alignment: Trace retrolisthesis of L3 on L4. 3 mm anterolisthesis of L4 on L5. Vertebrae: No fracture or suspicious osseous lesion. Multilevel chronic degenerative endplate changes. Prominent right greater than left facet edema at L4-5. Conus medullaris and cauda equina: Conus extends to the L2 level. Conus and cauda equina appear normal. Paraspinal and other soft tissues: Bilateral renal cysts measuring up to 3.9 cm. Colonic diverticulosis. Disc levels: Disc desiccation and mild disc space narrowing from L2-3 to L4-5. L1-2: Negative. L2-3: Disc bulging, prominent dorsal epidural fat, and moderate facet hypertrophy result in mild spinal stenosis, mild left lateral recess stenosis, and mild left neural foraminal stenosis. L3-4: Disc bulging eccentric to the left and mild facet hypertrophy result in mild bilateral lateral recess stenosis and mild left greater than right neural foraminal stenosis without significant spinal stenosis. L4-5: Anterolisthesis with right eccentric bulging of uncovered disc, mild ligamentum flavum hypertrophy, and severe facet hypertrophy result in mild spinal stenosis, moderate right and mild left lateral recess stenosis, and severe right and mild-to-moderate left neural foraminal stenosis. Potential right L4 nerve root impingement. Small facet joint effusions. L5-S1: Normal disc.  Moderate facet hypertrophy without stenosis. IMPRESSION: 1. Severe L4-5 facet arthrosis with facet edema, grade 1 anterolisthesis, mild spinal stenosis, and severe right neural foraminal stenosis. 2. Mild spinal stenosis at L2-3. Electronically Signed   By: Logan Bores M.D.   On: 07/09/2020 19:32   ECHOCARDIOGRAM COMPLETE  Result Date: 07/09/2020    ECHOCARDIOGRAM REPORT   Patient Name:   Gavin Dennis Date of Exam: 07/09/2020 Medical Rec #:  [id number]    Height:       71.0 in Accession #:    [id number]    Weight:       183.4 lb Date of Birth:  10/09/1950    BSA:          2.032 m Patient Age:    63 years     BP:           161/136 mmHg Patient Gender: M            HR:           73 bpm. Exam Location:  Inpatient Procedure: 2D Echo Indications:    stroke  History:        Patient has no prior history of Echocardiogram examinations.                 Risk Factors:Hypertension.  Sonographer:    Johny Chess Referring Phys: [id number] [place]  1. Left ventricular ejection fraction, by estimation, is 55 to 60%. The left ventricle has normal function. The left ventricle has no regional wall motion abnormalities. There is mild left ventricular hypertrophy. Left ventricular diastolic parameters are consistent with Grade I diastolic dysfunction (impaired relaxation).  2. Right ventricular systolic function is normal. The right ventricular size is normal. There is normal pulmonary artery systolic pressure. The estimated right ventricular systolic pressure is 02.4 mmHg.  3. The mitral valve is normal in structure. Trivial mitral valve regurgitation. No evidence of mitral stenosis.  4. The aortic valve is tricuspid. There is mild calcification of the aortic  valve. Aortic valve regurgitation is not visualized. No aortic stenosis is present.  5. Aortic dilatation noted. There is mild dilatation of the ascending aorta, measuring 40 mm.  6. The inferior vena cava is normal in size with greater than 50% respiratory variability, suggesting right atrial pressure of 3 mmHg. Conclusion(s)/Recommendation(s): No intracardiac source of embolism detected on this transthoracic study. A transesophageal echocardiogram is recommended to exclude cardiac source of embolism if clinically indicated. FINDINGS  Left Ventricle: Left ventricular ejection fraction, by estimation, is 55 to 60%. The left ventricle has normal function. The left ventricle has no regional wall motion abnormalities. The left ventricular internal cavity size was  normal in size. There is  mild left ventricular hypertrophy. Left ventricular diastolic parameters are consistent with Grade I diastolic dysfunction (impaired relaxation). Right Ventricle: The right ventricular size is normal. No increase in right ventricular wall thickness. Right ventricular systolic function is normal. There is normal pulmonary artery systolic pressure. The tricuspid regurgitant velocity is 2.66 m/s, and  with an assumed right atrial pressure of 3 mmHg, the estimated right ventricular systolic pressure is 33.2 mmHg. Left Atrium: Left atrial size was normal in size. Right Atrium: Right atrial size was normal in size. Pericardium: There is no evidence of pericardial effusion. Mitral Valve: The mitral valve is normal in structure. Mild mitral annular calcification. Trivial mitral valve regurgitation. No evidence of mitral valve stenosis. Tricuspid Valve: The tricuspid valve is normal in structure. Tricuspid valve regurgitation is mild . No evidence of tricuspid stenosis. Aortic Valve: The aortic valve is tricuspid. There is mild calcification of the aortic valve. Aortic valve regurgitation is not visualized. No aortic stenosis is present. Pulmonic Valve: The pulmonic valve was grossly normal. Pulmonic valve regurgitation is trivial. No evidence of pulmonic stenosis. Aorta: Aortic dilatation noted. There is mild dilatation of the ascending aorta, measuring 40 mm. Venous: The inferior vena cava is normal in size with greater than 50% respiratory variability, suggesting right atrial pressure of 3 mmHg. IAS/Shunts: No atrial level shunt detected by color flow Doppler.  LEFT VENTRICLE PLAX 2D LVIDd:         4.10 cm  Diastology LVIDs:         3.20 cm  LV e' medial:    6.31 cm/s LV PW:         1.00 cm  LV E/e' medial:  11.2 LV IVS:        1.20 cm  LV e' lateral:   5.87 cm/s LVOT diam:     2.00 cm  LV E/e' lateral: 12.0 LV SV:         57 LV SV Index:   28 LVOT Area:     3.14 cm  LEFT ATRIUM              Index       RIGHT ATRIUM           Index LA diam:        3.90 cm 1.92 cm/m  RA Area:     15.80 cm LA Vol (A2C):   44.7 ml 21.99 ml/m RA Volume:   39.50 ml  19.44 ml/m LA Vol (A4C):   52.2 ml 25.68 ml/m LA Biplane Vol: 51.7 ml 25.44 ml/m  AORTIC VALVE LVOT Vmax:   101.00 cm/s LVOT Vmean:  66.900 cm/s LVOT VTI:    0.181 m  AORTA Ao Root diam: 3.80 cm Ao Asc diam:  4.00 cm MITRAL VALVE  TRICUSPID VALVE MV Area (PHT): 2.95 cm     TR Peak grad:   28.3 mmHg MV Decel Time: 257 msec     TR Vmax:        266.00 cm/s MV E velocity: 70.40 cm/s MV A velocity: 123.00 cm/s  SHUNTS MV E/A ratio:  0.57         Systemic VTI:  0.18 m                             Systemic Diam: 2.00 cm Cherlynn Kaiser MD Electronically signed by Cherlynn Kaiser MD Signature Date/Time: 07/09/2020/4:59:26 PM    Final     PHYSICAL EXAM Blood pressure (!) 126/99, pulse 74, temperature 98.7 F (37.1 C), temperature source Oral, resp. rate (!) 23, height 5\' 11"  (1.803 m), weight 79 kg, SpO2 96 %.  General: alert and awake, elderly african [place] male, no apparent distress  Lungs: Symmetrical Chest rise, no labored breathing  Cardio: Regular Rate and Rhythm  Abdomen: Soft, non-tender  Neuro: Alert, oriented, thought content appropriate.  Speech fluent without evidence of aphasia.  Able to follow all commands without difficulty. He reported some binocular vertical double vision when looking upward and to the left, however, this was inconsistent.  Minimum skew eye deviation with the right eye slightly hyper trophic Cranial Nerves: II:  Visual fields grossly normal, pupils equal, round, reactive to light and accommodation III,IV, VI: ptosis not present, extra-ocular motions intact bilaterally.  Slight hypertropia of the right eye V,VII: smile symmetric, facial light touch sensation normal bilaterally VIII: hearing normal bilaterally IX,X: uvula rises symmetrically XI: bilateral shoulder shrug XII: midline tongue  extension without atrophy or fasciculations  Motor: Right : Upper extremity   5/5    Left:     Upper extremity   5/5  Lower extremity   5/5     Lower extremity   5/5 Tone and bulk:normal tone throughout; no atrophy noted Sensory: light touch intact throughout, bilaterally Cerebellar: normal finger-to-nose,  normal heel-to-shin test Gait: deferred    ASSESSMENT/PLAN Gavin Dennis is a 70 y.o. male with history of polysubstance use and longstanding history of hypertension, recently started seeing a primary care doctor about a month ago and has not seen a doctor in 20 to 30 years who presents with acute onset vision changes that he woke up with on 07/07/2020.  Patient reports that he went to bed at midnight on 07/07/2020 and woke up at 4 AM with difficulty judging the distance between himself and the objects around him and reporting that the proportion of floors are off.  The symptoms are persistent so he came to the emergency department for evaluation.  He denies any prior history of strokes, no arm or leg weakness or numbness, no dysarthria, no hemianopsia.  Endorses that he has had left foot neuropathy for the last couple years with pins and needle sensation in the bottom of his foot on the left.  Also endorses urinary issues for the last couple years with a sense of urinary urgency, unable to hold urine for reasonable amount of time and trouble completely emptying his bladder.  Denies any prior diagnosis of BPH, does not feel that he has a weak urinary stream.  Also endorses positive Lhermitte sign with shock sensation running from his neck into his lower legs periodically. Endorses some problems with his left leg but is unable to characterize them in detail.  Feels like it is  weak from time to time but that has been going on for the last 2 to 3 years.  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   Today he reports that he has no signs  or symptoms today except for some lingering visual deficits. However, the deficits are not consistent during the start of the exam he reported seeing stacked double vision when looking upward or to the Left but as the interview went on he stated no longer having these issues. Will start Atorvastatin and dual antithrombotic therapy. He does have a B12 deficiency so will start supplementation.  Stroke: Multiple Concurrent Bilateral subcortical and midbrain lacunar Infarcts secondary to small vessel disease from uncontrolled Hypertension and possibly secondary to COVID 19 dehydration  CTA Head and Neck: ordered  MRI - Background pattern of chronic small-vessel ischemic changes throughout the brain. Numerous (5-7) punctate acute infarctions in both cerebral hemispheres consistent with micro embolic infarctions from the heart or ascending aorta. No large confluent infarction.  MRA - The visualized distal vertebral arteries are patent to the basilar with the right being dominant. Patent bilateral PICA, left AICA, and bilateral SCA origins are visualized. The right AICA is small and poorly visualized. The basilar artery is widely patent. There are large posterior communicating arteries bilaterally with absent right and hypoplastic left P1 segments. Both PCAs are patent without evidence of a significant proximal stenosis. The internal carotid arteries are widely patent from skull base to carotid termini. ACAs and MCAs are patent without evidence of a proximal branch occlusion or significant proximal stenosis. No aneurysm is identified.  2D Echo - EF: 55-60%. No regional wall motion abnormalities.Aortic dilation noted.  LDL 75  HgbA1c 6.3  VTE prophylaxis - Lovenox    Diet   Diet Heart Room service appropriate? Yes; Fluid consistency: Thin     No antithrombotic prior to admission, now on aspirin 81 mg daily and clopidogrel 75 mg daily for 3 weeks then continue on only aspirin.   Therapy  recommendations:  Outpatient PT  Disposition:  Home once medically stable  Hypertension  Home meds:  Amlodipine, HCTZ  Stable . Long-term BP goal normotensive  Hyperlipidemia  Home meds:  none  LDL 75, goal < 70  Add Atorvastatin   High intensity statin Atorvastatin 20 mg  Continue statin at discharge  PreDiabetes   Home meds:  None  HgbA1c 6.3, goal < 7.0  CBGs  No results for input(s): GLUCAP in the last 72 hours.   SSI  Other Stroke Risk Factors  Advanced Age >/= 6   Former Cigarette smoker  Significant family Stroke history  Other Medical Problems:  B12 deficiency - 1000 mcg daily ordered   Hospital day # 0  Fatima Sanger MD Resident I have personally obtained history,examined this patient, reviewed notes, independently viewed imaging studies, participated in medical decision making and plan of care.ROS completed by me personally and pertinent positives fully documented  I have made any additions or clarifications directly to the above note. Agree with note above.  He presented with vision difficulties in the setting of acute COVID and MRI showing bilateral subcortical as well as midbrain lacunar infarcts likely from small vessel disease.  Recommend aspirin Plavix for 3 weeks followed by aspirin alone and aggressive risk factor modification.  Add low-dose statin for LDL of 75.  Treatment of COVID illness as per primary team.  Greater than 50% time during this  35-minute visit counseling and coordination of care about lacunar strokes and answering questions  Antony Contras, MD Medical Director [place] Pager: [phone] 07/10/2020 2:20 PM   To contact Stroke Continuity provider, please refer to [email or web address] After hours, contact General Neurology

## 2020-07-10 NOTE — Evaluation (Signed)
Physical Therapy Evaluation Patient Details Name: Gavin Dennis MRN: [id number] DOB: 12/12/1950 Today's Date: 07/10/2020   History of Present Illness  70 yo man presents with diplopia and blurry vision symptoms x2 days. MRI brain shows numerous punctate acute infarctions in both cerebral hemispheres. T spine negative, L spine mild stenosis, C spine C3-4, C5-6 disc degeneration. Tested positive for COVID on arrival. PMH is significant for HTN, polysubstance abuse, polio.  Clinical Impression  PTA, patient lives with son and reports independence with mobility and recently retired. Patient modI for bed mobility and min guard for sit to stand due to unsteadiness initially. Patient ambulated 23' with no AD and supervision. Patient presents with impaired balance, generalized weakness, decreased activity tolerance. Patient will benefit from skilled PT services during acute stay to address listed deficits. Recommend OPPT following discharge to maximize functional mobility and improve balance.     Follow Up Recommendations Outpatient PT    Equipment Recommendations  None recommended by PT    Recommendations for Other Services       Precautions / Restrictions Precautions Precautions: Fall Restrictions Weight Bearing Restrictions: No      Mobility  Bed Mobility Overal bed mobility: Modified Independent                  Transfers Overall transfer level: Needs assistance Equipment used: None Transfers: Sit to/from Stand Sit to Stand: Min guard         General transfer comment: min guard for safety, increased time/effort due to unsteadiness  Ambulation/Gait Ambulation/Gait assistance: Supervision Gait Distance (Feet): 50 Feet Assistive device: None Gait Pattern/deviations: Wide base of support;Decreased stride length;Step-through pattern     General Gait Details: Unsteady initially, improved with increased distance  Stairs            Wheelchair Mobility     Modified Rankin (Stroke Patients Only) Modified Rankin (Stroke Patients Only) Pre-Morbid Rankin Score: No significant disability Modified Rankin: No significant disability     Balance Overall balance assessment: Mild deficits observed, not formally tested                                           Pertinent Vitals/Pain Pain Assessment: No/denies pain    Home Living Family/patient expects to be discharged to:: Private residence Living Arrangements: Children Available Help at Discharge: Family;Available 24 hours/day Type of Home: House Home Access: Stairs to enter Entrance Stairs-Rails: Right;Can reach Software engineer of Steps: 4 Home Layout: One level Home Equipment: None      Prior Function Level of Independence: Independent         Comments: retired, 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 Double vision noted, patient able to  reduce double vision with closing of L eye    Exercises     Assessment/Plan    PT Assessment Patient needs continued PT services  PT Problem List Decreased strength;Decreased balance;Decreased activity tolerance;Decreased mobility       PT Treatment Interventions DME instruction;Gait training;Stair training;Functional mobility training;Therapeutic activities;Therapeutic exercise;Balance training;Patient/family education    PT Goals  (Current goals can be found in the Care Plan section)  Acute Rehab PT Goals Patient Stated Goal: to improve balance PT Goal Formulation: With patient Time For Goal Achievement: 07/24/20 Potential to Achieve Goals: Good    Frequency Min 4X/week   Barriers to discharge        Co-evaluation               AM-PAC PT "6 Clicks" Mobility  Outcome Measure Help needed turning from your back to your side while in a flat bed without using bedrails?: None Help needed moving from lying on your back to sitting on the side of a flat bed without using bedrails?: None Help needed moving to and from a bed to a chair (including a wheelchair)?: A Little Help needed standing up from a chair using your arms (e.g., wheelchair or bedside chair)?: A Little Help needed to walk in hospital room?: A Little Help needed climbing 3-5 steps with a railing? : A Little 6 Click Score: 20    End of Session   Activity Tolerance: Patient tolerated treatment well Patient left: in bed;with call bell/phone within reach Nurse Communication: Mobility status;Other (comment) (call bell not working) PT Visit Diagnosis: Unsteadiness on feet (R26.81);Muscle weakness (generalized) (M62.81)    Time: 2956-2130 PT Time Calculation (min) (ACUTE ONLY): 21 min   Charges:   PT Evaluation $PT Eval Low Complexity: 1 Low          Lakela Kuba A. Gilford Rile PT, DPT Acute Rehabilitation Services Pager [phone] Office [phone]   Linna Hoff 07/10/2020, 9:29 AM

## 2020-07-10 NOTE — Discharge Summary (Signed)
[facility] Discharge Summary  Patient name: Gavin Dennis Medical record number: [id number] Date of birth: [birth year] Age: 70 y.o. Gender: male Date of Admission: 07/09/2020  Date of Discharge: 07/10/2020 Admitting Physician: Leeanne Rio, MD  Primary Care Provider: Pcp, No Consultants: Neurology  Indication for Hospitalization:   Discharge Diagnoses/Problem List:  CVA Covid positive Hypertension Alcoholic hepatitis AKI  Disposition: Home  Discharge Condition: Medically stable for discharge  Discharge Exam:   Physical Exam:  General: 70 y.o. male in NAD HEENT: b/l pupils equally reactive and sluggish, diplopia Cardio: RRR no m/r/g Lungs: CTAB, no wheezing, no rhonchi, no crackles, no IWOB on RA Abdomen: Soft, non-tender to palpation, non-distended, positive bowel sounds Skin: warm and dry Extremities: No edema Neuro: CN II-XII grossly intact aside from diplopia and sluggish pupils, 5/5 strength BUE/BLE, sensation intact throughout   Brief Hospital Course:  Gavin Dennis is a 70 y.o. male presenting with acute new stroke. PMH is significant for HTN and polysubstance abuse.  CVA 70 year old male presented with diplopia and blurry vision. MRI brain: Multiple punctate infarctions in cerebral hemispheres with microembolic infarctions from heart or a sending aorta. MRA brain: Negative. Echo on 3/11: EF 55 to 60%, mild LVH, grade 1 diastolic dysfunction. Neuro consulted who recommended: ASA 81 mg (indefinitely) and Plavix 75 mg (21 days only) and risk stratification. A1c-6.3. Lipid panel-HDL 39, LDL 75 and TSH 1.812.  Patient was started on atorvastatin 20 mg daily   Covid positive, new Patient was found to be Covid positive on admission.  Was asymptomatic.  Has received all 3 Covid vaccines.  Discussed with pharmacy and patient regarding remdesivir for 3 days given his risk factors and age.  Patient decided to have remdesivir loading infusion in  hospital prior to discharge.  His vital signs remained stable with sats >95% throughout hospital admission.  At the time of discharge, vitals were stable.  He continued to have diplopia, sluggish pupils b/l, and ptosis of the left eye at the time of discharge.   Hypertension Amlodipine and HCTZ were held in the setting of CVA and we allowed for permissive hypertension.  Amlodipine restarted on discharge.  Alcoholic hepatitis  Patient drinks 1.5 L of wine a night.  AST and ALT mildly elevated to 46 and 94 respectively  AKI Creatinine on admission 1.38. Cr 1.15 on discharge.    Issues for Follow Up:   1.  Neurology follow-up 6 weeks post d/c 2. Encourage cessation of alcohol 3. Monitor BP. Consider restarting HCZT 4. Do not drive until seen by Neurology  5. Remdesivir outpatient treatment for Covid positive.  Patient already received loading dose of remdesivir in hospital 6. ASA 81 mg (indefinitely) and Plavix 75 mg (21 days only)  7.  Recommend vitamin B12 on discharge 8.  Atorvastatin 20 mg daily  Significant Procedures:   Significant Labs and Imaging:  Recent Labs  Lab 07/09/20 1143 07/09/20 2149  WBC 5.5 6.1  HGB 16.3 16.0  HCT 47.6 47.0  PLT 229 215   Recent Labs  Lab 07/09/20 1143 07/09/20 2149  NA 138 138  K 3.8 3.8  CL 106 105  CO2 27 27  GLUCOSE 157* 98  BUN 25* 23  CREATININE 1.38* 1.15  CALCIUM 10.2 10.2  MG  --  1.6*  PHOS  --  3.3  ALKPHOS 112 109  AST 46* 37  ALT 94* 83*  ALBUMIN 3.5 3.3*     Results/Tests Pending at Time of Discharge:  Discharge Medications:  Allergies as of 07/11/2020   No Known Allergies     Medication List    STOP taking these medications   hydrochlorothiazide 25 MG tablet Commonly known as: HYDRODIURIL     TAKE these medications   amLODipine 10 MG tablet Commonly known as: NORVASC Take 1 tablet (10 mg total) by mouth daily.   aspirin 81 MG EC tablet Take 1 tablet (81 mg total) by mouth daily. Swallow  whole.   atorvastatin 20 MG tablet Commonly known as: LIPITOR Take 1 tablet (20 mg total) by mouth daily.   clopidogrel 75 MG tablet Commonly known as: PLAVIX Take 1 tablet (75 mg total) by mouth daily for 20 days.   cyanocobalamin 1000 MCG tablet Take 1 tablet (1,000 mcg total) by mouth daily.   folic acid 1 MG tablet Commonly known as: FOLVITE Take 1 tablet (1 mg total) by mouth daily.   thiamine 100 MG tablet Take 1 tablet (100 mg total) by mouth daily.       Discharge Instructions: Please refer to Patient Instructions section of EMR for full details.  Patient was counseled important signs and symptoms that should prompt return to medical care, changes in medications, dietary instructions, activity restrictions, and follow up appointments.   Follow-Up Appointments:   F/U PCP 1 week.  [place], DO 07/11/2020, 6:17 AM PGY-3, [place]

## 2020-07-11 MED ORDER — AMLODIPINE BESYLATE 10 MG PO TABS
10.0000 mg | ORAL_TABLET | Freq: Every day | ORAL | Status: DC
  Administered 2020-07-11: 10 mg via ORAL
  Filled 2020-07-11 (×2): qty 1

## 2020-07-11 MED ORDER — SODIUM CHLORIDE 0.9 % IV SOLN
100.0000 mg | Freq: Once | INTRAVENOUS | Status: AC
  Administered 2020-07-11: 100 mg via INTRAVENOUS
  Filled 2020-07-11 (×3): qty 20

## 2020-07-11 MED ORDER — AMLODIPINE BESYLATE 10 MG PO TABS: 10.0000 mg | ORAL_TABLET | Freq: Every day | ORAL | Status: DC

## 2020-07-11 NOTE — Progress Notes (Signed)
Pt wheeled off unit with son and all his belongings by his side. IV removed and CCMD notified of pts removal off the monitor.

## 2020-07-11 NOTE — TOC CAGE-AID Note (Signed)
Transition of Care [facility]) - CAGE-AID Screening   Patient Details  Name: Gavin Dennis MRN: [id number] Date of Birth: 12-14-1950  Transition of Care [facility]) CM/SW Contact:    Coralee Pesa, [place] Phone Number: 07/11/2020, 10:38 AM   Clinical Narrative: CSW completed CAGE- AID assessment with pt. Pt noted that he no longer drinks or uses drugs, states he "Just stopped" about a month and a half ago. CSW offered resources for continued treatment, but pt declined.   CAGE-AID Screening:    Have You Ever Felt You Ought to Cut Down on Your Drinking or Drug Use?: No Have People Annoyed You By Critizing Your Drinking Or Drug Use?: No Have You Felt Bad Or Guilty About Your Drinking Or Drug Use?: No Have You Ever Had a Drink or Used Drugs First Thing In The Morning to Steady Your Nerves or to Get Rid of a Hangover?: No CAGE-AID Score: 0  Substance Abuse Education Offered: No

## 2020-07-11 NOTE — Progress Notes (Signed)
Family Medicine Teaching Service Daily Progress Note Intern Pager: [phone]  Patient name: Gavin Dennis Medical record number: [id number] Date of birth: 1951-01-06 Age: 70 y.o. Gender: male  Primary Care Provider: Pcp, No Consultants:  Code Status: FULL   Pt Overview and Major Events to Date:  Gavin Dennis is a 70 y.o. male presenting with diplopia and blurry vision with acute infarcts on MR brain. PMH is significant for HTN, polysubstance abuse.    Assessment and Plan:  Stroke: acute Deficits remain stable from previous. CT head on admission-age-related atrophy and microvascular ischemic changes. MRI brain: Multiple punctate infarctions in cerebral hemispheres with microembolic infarctions from heart or a sending aorta. MRA brain: Negative Echo on 3/11: EF 55 to 60%, mild LVH, grade 1 diastolic dysfunction.  Risk stratification labs: HDL 39, LDL 75, A1c 6.3, TSH 1.81 -Neuro s/o, f/u outpatient - referred to PT/OT outpatient -ASA 81mg  and plavix 75mg  x 3 weeks, then  ASA only - gradually normalize BP - cont lipitor 20mg  QD   Covid positive, new Remains stable from respiratory standpoint.  Received remdesivir infusion overnight and stayed overnight due to finishing late. -Monitor respiratory status -refer to outpatient COVID treatment on discharge for further remdesivir  Hypertension: chronic, uncontrolled BP this AM 159/115. Home meds: Amlodipine 10 mg daily, HCTZ 25 mg daily -restarted norvasc at 5mg , can d/chome on 10mg  - restarted HCTZ at home as indicated - gradually normalize BP   Cervical Radiculopathy  Neuropathy  Hx Urinary Incontinence MR cervical-lumbar spine-spinal stenosis from C3-C4, C5-C6 severe multilevel neural foraminal stenosis, lower thoracic spondylosis with mild spinal stenosis at T10-11, severe L4-L5 facet arthrosis, mild spinal stenosis at L2-L3. No evidence of cord compression B12 142, folate 12.8 -Neurology consulted, appreciate  recommendations -Methylmalonic acid pending  -Protein electrophoresis pending  -Consider PSA; shared decision making with patient    Alcoholic hepatitis Likely alcoholic hepatitis in the setting of drinking 1.5 L of wine a night.  AST 37, ALT 83 this morning improving from yesterday -Monitor with CMP -CIWA -Alcohol cessation counseling with TOC   AKI Cr 1.15, improved from 1.38 yesterday -Avoid nephrotoxic agents -NS at 100 mL/hr -Continue to monitor with BMP   Hx Tobacco Abuse Cocaine Abuse  Alcohol Abuse CIWA's overnight 0. -CIWA, PRN Ativan if needed -Offer Nicotine patch  -Folic acid 1 mg daily -Multivitamin daily -Thiamine 100 mg daily   FEN/GI: heart healthy diet  PPx: Lovenox   Status is: Observation  Dispo: The patient is from: Home              Anticipated d/c is to: Home              Patient currently stable for d/c.   Difficult to place patient No   Subjective:  Received remdesivir overnight.  Denies complaints this AM.  Wants to leave this AM.  Objective: Temp:  [97.8 F (36.6 C)-98.8 F (37.1 C)] 97.8 F (36.6 C) (03/13 0324) Pulse Rate:  [65-104] 66 (03/13 0324) Resp:  [18-23] 20 (03/13 0324) BP: (126-159)/(96-116) 159/115 (03/13 0324) SpO2:  [95 %-98 %] 98 % (03/13 0324)   Physical Exam:  General: 70 y.o. male in NAD HEENT: b/l pupils equally reactive and sluggish, diplopia Cardio: RRR no m/r/g Lungs: CTAB, no wheezing, no rhonchi, no crackles, no IWOB on RA Abdomen: Soft, non-tender to palpation, non-distended, positive bowel sounds Skin: warm and dry Extremities: No edema Neuro: CN II-XII grossly intact aside from diplopia and sluggish pupils, 5/5 strength BUE/BLE, sensation intact throughout  Laboratory: Recent Labs  Lab 07/09/20 1143 07/09/20 2149  WBC 5.5 6.1  HGB 16.3 16.0  HCT 47.6 47.0  PLT 229 215   Recent Labs  Lab 07/09/20 1143 07/09/20 2149  NA 138 138  K 3.8 3.8  CL 106 105  CO2 27 27  BUN 25* 23  CREATININE  1.38* 1.15  CALCIUM 10.2 10.2  PROT 7.1 6.8  BILITOT 0.9 0.9  ALKPHOS 112 109  ALT 94* 83*  AST 46* 37  GLUCOSE 157* 98     Imaging/Diagnostic Tests: CT ANGIO HEAD W OR WO CONTRAST  Result Date: 07/10/2020 CLINICAL DATA:  Acute visual changes. Scattered punctate acute embolic cerebral infarcts on head MRI yesterday. EXAM: CT ANGIOGRAPHY HEAD AND NECK TECHNIQUE: Multidetector CT imaging of the head and neck was performed using the standard protocol during bolus administration of intravenous contrast. Multiplanar CT image reconstructions and MIPs were obtained to evaluate the vascular anatomy. Carotid stenosis measurements (when applicable) are obtained utilizing NASCET criteria, using the distal internal carotid diameter as the denominator. CONTRAST:  77mL OMNIPAQUE IOHEXOL 350 MG/ML SOLN COMPARISON:  Head MRI and MRA 07/09/2020 FINDINGS: CT HEAD FINDINGS Brain: The small acute cerebral infarcts on yesterday's MRI are not well demonstrated by CT. There is a background of extensive chronic small vessel ischemia in the cerebral white matter and deep gray nuclei bilaterally. Mild cerebral atrophy is within normal limits for age. A small chronic infarct is again noted in the right cerebellum. Vascular: Calcified atherosclerosis at the skull base. Skull: No fracture or suspicious osseous lesion. Sinuses: Small right maxillary sinus. No evidence of acute inflammatory sinus disease. Clear mastoid air cells. Orbits: Unremarkable. Review of the MIP images confirms the above findings CTA NECK FINDINGS Aortic arch: Standard 3 vessel aortic arch with mild atherosclerotic plaque. No arch vessel origin stenosis. Right carotid system: Patent with mild calcified and soft plaque at the carotid bifurcation. No evidence of significant stenosis or dissection. Left carotid system: Patent without evidence of stenosis, dissection, or significant atherosclerosis. Vertebral arteries: Patent without evidence of significant  stenosis or dissection. Moderately to strongly dominant right vertebral artery. Skeleton: Mild cervical spondylosis. Other neck: Bilateral thyroid nodules measuring up to 1 cm which are considered clinically insignificant and for which no imaging follow-up is recommended. Upper chest: Mild centrilobular emphysema. Review of the MIP images confirms the above findings CTA HEAD FINDINGS Anterior circulation: The internal carotid arteries are patent from skull base to carotid termini with mild-to-moderate atherosclerotic plaque bilaterally not resulting in significant stenosis. ACAs and MCAs are patent without evidence of a proximal branch occlusion or significant proximal stenosis. No aneurysm is identified. Posterior circulation: The intracranial vertebral arteries are widely patent to the basilar. Patent bilateral PICA, left AICA, and bilateral SCA origins are identified. The right AICA is small and poorly evaluated. The basilar artery is widely patent. There are large bilateral posterior communicating arteries with absent right and hypoplastic left P1 segments. Both PCAs are patent without evidence of a significant proximal stenosis. No aneurysm is identified. Venous sinuses: Patent. Anatomic variants: Fetal PCAs. Review of the MIP images confirms the above findings IMPRESSION: 1. Mild atherosclerosis in the head and neck without large vessel occlusion or significant proximal stenosis. 2. Aortic Atherosclerosis (ICD10-I70.0) and Emphysema (ICD10-J43.9). Electronically Signed   By: Logan Bores M.D.   On: 07/10/2020 14:04   CT ANGIO NECK W OR WO CONTRAST  Result Date: 07/10/2020 CLINICAL DATA:  Acute visual changes. Scattered punctate acute embolic cerebral infarcts on  head MRI yesterday. EXAM: CT ANGIOGRAPHY HEAD AND NECK TECHNIQUE: Multidetector CT imaging of the head and neck was performed using the standard protocol during bolus administration of intravenous contrast. Multiplanar CT image reconstructions and  MIPs were obtained to evaluate the vascular anatomy. Carotid stenosis measurements (when applicable) are obtained utilizing NASCET criteria, using the distal internal carotid diameter as the denominator. CONTRAST:  60mL OMNIPAQUE IOHEXOL 350 MG/ML SOLN COMPARISON:  Head MRI and MRA 07/09/2020 FINDINGS: CT HEAD FINDINGS Brain: The small acute cerebral infarcts on yesterday's MRI are not well demonstrated by CT. There is a background of extensive chronic small vessel ischemia in the cerebral white matter and deep gray nuclei bilaterally. Mild cerebral atrophy is within normal limits for age. A small chronic infarct is again noted in the right cerebellum. Vascular: Calcified atherosclerosis at the skull base. Skull: No fracture or suspicious osseous lesion. Sinuses: Small right maxillary sinus. No evidence of acute inflammatory sinus disease. Clear mastoid air cells. Orbits: Unremarkable. Review of the MIP images confirms the above findings CTA NECK FINDINGS Aortic arch: Standard 3 vessel aortic arch with mild atherosclerotic plaque. No arch vessel origin stenosis. Right carotid system: Patent with mild calcified and soft plaque at the carotid bifurcation. No evidence of significant stenosis or dissection. Left carotid system: Patent without evidence of stenosis, dissection, or significant atherosclerosis. Vertebral arteries: Patent without evidence of significant stenosis or dissection. Moderately to strongly dominant right vertebral artery. Skeleton: Mild cervical spondylosis. Other neck: Bilateral thyroid nodules measuring up to 1 cm which are considered clinically insignificant and for which no imaging follow-up is recommended. Upper chest: Mild centrilobular emphysema. Review of the MIP images confirms the above findings CTA HEAD FINDINGS Anterior circulation: The internal carotid arteries are patent from skull base to carotid termini with mild-to-moderate atherosclerotic plaque bilaterally not resulting in  significant stenosis. ACAs and MCAs are patent without evidence of a proximal branch occlusion or significant proximal stenosis. No aneurysm is identified. Posterior circulation: The intracranial vertebral arteries are widely patent to the basilar. Patent bilateral PICA, left AICA, and bilateral SCA origins are identified. The right AICA is small and poorly evaluated. The basilar artery is widely patent. There are large bilateral posterior communicating arteries with absent right and hypoplastic left P1 segments. Both PCAs are patent without evidence of a significant proximal stenosis. No aneurysm is identified. Venous sinuses: Patent. Anatomic variants: Fetal PCAs. Review of the MIP images confirms the above findings IMPRESSION: 1. Mild atherosclerosis in the head and neck without large vessel occlusion or significant proximal stenosis. 2. Aortic Atherosclerosis (ICD10-I70.0) and Emphysema (ICD10-J43.9). Electronically Signed   By: Logan Bores M.D.   On: 07/10/2020 14:04    Lilla Callejo, Bernita Raisin, DO 07/11/2020, 6:40 AM PGY-3 [place] Intern pager: [phone], text pages welcome

## 2020-07-12 LAB — PROTEIN ELECTROPHORESIS, SERUM
A/G Ratio: 0.9 (ref 0.7–1.7)
Albumin ELP: 3.3 g/dL (ref 2.9–4.4)
Alpha-1-Globulin: 0.2 g/dL (ref 0.0–0.4)
Alpha-2-Globulin: 0.7 g/dL (ref 0.4–1.0)
Beta Globulin: 1.1 g/dL (ref 0.7–1.3)
Gamma Globulin: 1.5 g/dL (ref 0.4–1.8)
Globulin, Total: 3.6 g/dL (ref 2.2–3.9)
M-Spike, %: 0.5 g/dL — ABNORMAL HIGH
Total Protein ELP: 6.9 g/dL (ref 6.0–8.5)

## 2020-07-14 LAB — METHYLMALONIC ACID, SERUM: Methylmalonic Acid, Quantitative: 268 nmol/L (ref 0–378)

## 2020-07-15 ENCOUNTER — Encounter: Payer: Self-pay | Admitting: Family Medicine

## 2020-07-15 ENCOUNTER — Ambulatory Visit (INDEPENDENT_AMBULATORY_CARE_PROVIDER_SITE_OTHER): Payer: Self-pay | Admitting: *Deleted

## 2020-07-15 NOTE — Telephone Encounter (Signed)
Pt transferred to triage after calling to schedule F/U hospital appt. Pt reported to agent "Blurred vision."  Pt reports vision, not new,  has been blurred since admission and discharge (07/11/20) from hospital. Has not worsened, "Actually a little better." Denies any other symptoms, no headache or dizziness, no unilateral weakness, speech clear. Advised agent requested earlier appt than one scheduled for 07/29/20. CRM sent by Agent Claudia Desanctis.  Advised ED if any above symptoms occur, vision worsens. Pt verbalizes understanding.  Reason for Disposition . Nursing judgment or information in reference  Answer Assessment - Initial Assessment Questions 1. REASON FOR CALL: "What is your main concern right now?"     *No Answer* 2. ONSET: "When did the *No Answer* start?"     *No Answer* 3. SEVERITY: "How bad is the *No Answer*?"     *No Answer* 4. FEVER: "Do you have a fever?"     *No Answer* 5. OTHER SYMPTOMS: "Do you have any other new symptoms?"     *No Answer* 6. INTERVENTIONS AND RESPONSE: "What have you done so far to try to make this better? What medications have you used?"     *No Answer* 7. PREGNANCY: "Is there any chance you are pregnant?"     *No Answer*  Protocols used: NO GUIDELINE AVAILABLE-A-AH

## 2020-07-15 NOTE — Telephone Encounter (Signed)
FYI

## 2020-07-30 ENCOUNTER — Ambulatory Visit (INDEPENDENT_AMBULATORY_CARE_PROVIDER_SITE_OTHER): Payer: Self-pay | Admitting: Primary Care

## 2020-07-30 ENCOUNTER — Encounter (INDEPENDENT_AMBULATORY_CARE_PROVIDER_SITE_OTHER): Payer: Self-pay | Admitting: Primary Care

## 2020-07-30 ENCOUNTER — Other Ambulatory Visit: Payer: Self-pay

## 2020-07-30 VITALS — BP 134/94 | HR 100 | Temp 97.3°F | Ht 71.0 in | Wt 183.8 lb

## 2020-07-30 DIAGNOSIS — I10 Essential (primary) hypertension: Secondary | ICD-10-CM

## 2020-07-30 DIAGNOSIS — Z09 Encounter for follow-up examination after completed treatment for conditions other than malignant neoplasm: Secondary | ICD-10-CM

## 2020-07-30 DIAGNOSIS — I6300 Cerebral infarction due to thrombosis of unspecified precerebral artery: Secondary | ICD-10-CM

## 2020-07-30 NOTE — Progress Notes (Signed)
CC: HOSPITAL DISCHARGE     HPI  70 y.o.male presents for follow up from the hospital. Admit date to the hospital was 07/09/20, patient was discharged from the hospital on 07/11/20, patient was admitted for: CVA (cerebral vascular accident) [facility]), Stroke ([place])  and Visual disturbance. Hypertension management -Denies shortness of breath, headaches, chest pain or lower extremity edema, sudden onset, unilateral weakness, dizziness, paresthesias.  He has had vision changes since CVA.  Past Medical History:  Diagnosis Date  . Hypertension   . Polio      No Known Allergies    Current Outpatient Medications on File Prior to Visit  Medication Sig Dispense Refill  . aspirin EC 81 MG EC tablet Take 1 tablet (81 mg total) by mouth daily. Swallow whole. 30 tablet 0  . atorvastatin (LIPITOR) 20 MG tablet Take 1 tablet (20 mg total) by mouth daily. 30 tablet 0  . clopidogrel (PLAVIX) 75 MG tablet Take 1 tablet (75 mg total) by mouth daily for 20 days. 20 tablet 0  . folic acid (FOLVITE) 1 MG tablet Take 1 tablet (1 mg total) by mouth daily. 30 tablet 0  . thiamine 100 MG tablet Take 1 tablet (100 mg total) by mouth daily. 30 tablet 0  . vitamin B-12 1000 MCG tablet Take 1 tablet (1,000 mcg total) by mouth daily. 30 tablet 0  . amLODipine (NORVASC) 10 MG tablet Take 1 tablet (10 mg total) by mouth daily. 90 tablet 1   No current facility-administered medications on file prior to visit.    ROS:  Review of Systems  Eyes: Positive for double vision.  All other systems reviewed and are negative.   Physical Exam: Filed Weights   07/30/20 0911  Weight: 183 lb 12.8 oz (83.4 kg)   BP (!) 146/102 (BP Location: Right Arm, Patient Position: Sitting, Cuff Size: Normal)   Pulse 97   Temp (!) 97.3 F (36.3 C) (Temporal)   Ht 5\' 11"  (1.803 m)   Wt 183 lb 12.8 oz (83.4 kg)   SpO2 96%   BMI 25.63 kg/m  General Appearance: Well nourished, in no apparent distress. Eyes: PERRLA, EOMs, conjunctiva no  swelling or erythema Sinuses: No Frontal/maxillary tenderness ENT/Mouth: Ext aud canals clear, TMs without erythema, bulging.  Hearing normal.  Neck: Supple, thyroid normal.  Respiratory: Respiratory effort normal, BS equal bilaterally without rales, rhonchi, wheezing or stridor.  Cardio: RRR with no MRGs. Brisk peripheral pulses without edema.  Abdomen: Soft, + BS.  Non tender, no guarding, rebound, hernias, masses. Lymphatics: Non tender without lymphadenopathy.  Musculoskeletal: Full ROM, 5/5 strength, normal gait.  Skin: Warm, dry without rashes, lesions, ecchymosis.  Neuro: Cranial nerves intact. Normal muscle tone, no cerebellar symptoms. Sensation intact.  Psych: Awake and oriented X 3, normal affect, Insight and Judgment appropriate.    Vikram was seen today for blurred vision.  Diagnoses and all orders for this visit:  Essential hypertension Counseled on blood pressure goal of less than 130/80, low-sodium, DASH diet, medication compliance, 150 minutes of moderate intensity exercise per week. Discussed medication compliance, adverse effects.  Hospital discharge follow-up Follow up with PCP- refer to neurology for f/u CVA  Cerebrovascular accident (CVA) due to thrombosis of precerebral artery [facility]) -     Ambulatory referral to Neurology  Reviewed hospital encounters, labs and imaging  Kerin Perna, NP 9:26 AM

## 2020-07-30 NOTE — Patient Instructions (Signed)

## 2020-09-20 ENCOUNTER — Other Ambulatory Visit (INDEPENDENT_AMBULATORY_CARE_PROVIDER_SITE_OTHER): Payer: Self-pay | Admitting: Primary Care

## 2020-09-20 DIAGNOSIS — I10 Essential (primary) hypertension: Secondary | ICD-10-CM

## 2020-09-20 MED ORDER — AMLODIPINE BESYLATE 10 MG PO TABS: 10.0000 mg | ORAL_TABLET | Freq: Every day | ORAL | 0 refills | Status: DC

## 2020-09-20 NOTE — Telephone Encounter (Signed)
Spoke with pharmacist, refill available and filled.

## 2020-09-20 NOTE — Telephone Encounter (Signed)
Medication: atorvastatin (LIPITOR) 20 MG tablet [[id number]]  ENDED, amLODipine (NORVASC) 10 MG tablet [[id number]]  ENDED  Has the patient contacted their pharmacy? YES  (Agent: If no, request that the patient contact the pharmacy for the refill.) (Agent: If yes, when and what did the pharmacy advise?)  Preferred Pharmacy (with phone number or street name): [place] ([place]), [place] - [address] [place] [address] Phone: [phone] Fax: [phone] Hours: Not open 24 hours    Agent: Please be advised that RX refills may take up to 3 business days. We ask that you follow-up with your pharmacy.

## 2020-09-21 ENCOUNTER — Telehealth (INDEPENDENT_AMBULATORY_CARE_PROVIDER_SITE_OTHER): Payer: Self-pay | Admitting: Primary Care

## 2020-09-21 NOTE — Telephone Encounter (Signed)
Please advise 

## 2020-09-21 NOTE — Telephone Encounter (Signed)
Patient is requesting refill of medication (Hydrochlorothiazide 25 mg) listed as stop taking at discharge. Request sent for PCP review- as it was not started at hospital follow up. Attempted to call patient- no answer and voicemail not set up- unable to leave call back message.

## 2020-09-21 NOTE — Telephone Encounter (Unsigned)
Copied from [place] [id number]. Topic: Quick Communication - Rx Refill/Question >> Sep 21, 2020 11:26 AM Tessa Lerner A wrote: Medication: hydrochlorothiazide (HYDRODIURIL) 25 MG tablet [[id number]]   Has the patient contacted their pharmacy? No. Patient was uncertain of their refill status  Preferred Pharmacy (with phone number or street name): [place] ([place]), [place] - [address]  Phone:  [phone] Fax:  [phone]  Agent: Please be advised that RX refills may take up to 3 business days. We ask that you follow-up with your pharmacy.

## 2020-09-22 MED ORDER — ATORVASTATIN CALCIUM 20 MG PO TABS
20.0000 mg | ORAL_TABLET | Freq: Every day | ORAL | 0 refills | Status: DC
Start: 2020-09-22 — End: 2020-10-06

## 2020-09-22 NOTE — Telephone Encounter (Signed)
All attempts to reach patient have been unsuccessful. Phone beeps like a fax machine. Will have PCP discuss with patient at [place] on 10/06/20.    Sharyn Lull please discuss with patient at appointment.

## 2020-09-22 NOTE — Telephone Encounter (Signed)
Hydrochlorothiazide was discontinued during hospitalization.  He needs to follow-up with PCP to reevaluate blood pressure

## 2020-09-22 NOTE — Telephone Encounter (Signed)
Not currently on patient medication list. Please advise on if prescription refill is appropriate.

## 2020-10-06 ENCOUNTER — Encounter (INDEPENDENT_AMBULATORY_CARE_PROVIDER_SITE_OTHER): Payer: Self-pay | Admitting: Primary Care

## 2020-10-06 ENCOUNTER — Ambulatory Visit (INDEPENDENT_AMBULATORY_CARE_PROVIDER_SITE_OTHER): Payer: Self-pay | Admitting: Primary Care

## 2020-10-06 ENCOUNTER — Other Ambulatory Visit: Payer: Self-pay

## 2020-10-06 VITALS — BP 148/108 | HR 87 | Temp 97.3°F | Ht 71.0 in | Wt 195.6 lb

## 2020-10-06 DIAGNOSIS — Z23 Encounter for immunization: Secondary | ICD-10-CM

## 2020-10-06 DIAGNOSIS — I10 Essential (primary) hypertension: Secondary | ICD-10-CM

## 2020-10-06 DIAGNOSIS — I6300 Cerebral infarction due to thrombosis of unspecified precerebral artery: Secondary | ICD-10-CM

## 2020-10-06 MED ORDER — AMLODIPINE BESYLATE 10 MG PO TABS: 10.0000 mg | ORAL_TABLET | Freq: Every day | ORAL | 0 refills | Status: DC

## 2020-10-06 MED ORDER — ATORVASTATIN CALCIUM 20 MG PO TABS: 20.0000 mg | ORAL_TABLET | Freq: Every day | ORAL | 0 refills | Status: DC

## 2020-10-06 MED ORDER — HYDROCHLOROTHIAZIDE 25 MG PO TABS: 25.0000 mg | ORAL_TABLET | Freq: Every day | ORAL | 3 refills | Status: DC

## 2020-10-06 NOTE — Progress Notes (Signed)
  Subjective:    Patient here for follow-up of elevated blood pressure.  He is not exercising and is adherent to a low-salt diet.  Blood pressure is not well controlled at home. Cardiac symptoms: chest pain, chest pressure/discomfort and lower extremity edema. Patient denies: dyspnea, fatigue, palpitations and syncope. Cardiovascular risk factors: advanced age (older than 43 for men, 41 for women), dyslipidemia and hypertension. Use of agents associated with hypertension: decongestants. History of target organ damage: stroke.  The following portions of the patient's history were reviewed and updated as appropriate: allergies, past social history and problem list.  Review of Systems Review of Systems  Cardiovascular: Positive for chest pain and leg swelling.  All other systems reviewed and are negative.    Objective:  BP (!) 148/108 (BP Location: Right Arm, Patient Position: Sitting, Cuff Size: Normal)   Pulse 87   Temp (!) 97.3 F (36.3 C) (Temporal)   Ht 5\' 11"  (1.803 m)   Wt 195 lb 9.6 oz (88.7 kg)   SpO2 95%   BMI 27.28 kg/m    Assessment:    Hypertension, stage 2 . Evidence of target organ damage: stroke.    Plan:    Medication: continue amlodipine 10mg  and add HCTZ 25mg . Dietary sodium restriction. Regular aerobic exercise.   Gavin Dennis was seen today for hypertension.  Diagnoses and all orders for this visit:  Essential hypertension Counseled on blood pressure goal of less than 130/80, low-sodium, DASH diet, medication compliance, 150 minutes of moderate intensity exercise per week. Discussed medication compliance, adverse effects.  -     hydrochlorothiazide (HYDRODIURIL) 25 MG tablet; Take 1 tablet (25 mg total) by mouth daily. -     amLODipine (NORVASC) 10 MG tablet; Take 1 tablet (10 mg total) by mouth daily.  Need for vaccination against Streptococcus pneumoniae using pneumococcal conjugate vaccine 13 -     Pneumococcal polysaccharide vaccine 23-valent greater than or  equal to 2yo subcutaneous/IM; Future  Cerebrovascular accident (CVA) due to thrombosis of precerebral artery (HCC) -     atorvastatin (LIPITOR) 20 MG tablet; Take 1 tablet (20 mg total) by mouth daily.

## 2020-10-06 NOTE — Patient Instructions (Signed)

## 2020-10-21 ENCOUNTER — Other Ambulatory Visit (INDEPENDENT_AMBULATORY_CARE_PROVIDER_SITE_OTHER): Payer: Self-pay | Admitting: Primary Care

## 2020-10-21 DIAGNOSIS — I6300 Cerebral infarction due to thrombosis of unspecified precerebral artery: Secondary | ICD-10-CM

## 2020-10-21 NOTE — Telephone Encounter (Signed)
  Notes to clinic: review for refill Patient has appt on 11/04/2020 Should have enough until that time   Requested Prescriptions  Pending Prescriptions Disp Refills   atorvastatin (LIPITOR) 20 MG tablet 30 tablet 0    Sig: Take 1 tablet (20 mg total) by mouth daily.      Cardiovascular:  Antilipid - Statins Failed - 10/21/2020  9:34 AM      Failed - HDL in normal range and within 360 days    HDL  Date Value Ref Range Status  07/10/2020 39 (L) >40 mg/dL Final          Passed - Total Cholesterol in normal range and within 360 days    Cholesterol  Date Value Ref Range Status  07/10/2020 135 0 - 200 mg/dL Final          Passed - LDL in normal range and within 360 days    LDL Cholesterol  Date Value Ref Range Status  07/10/2020 75 0 - 99 mg/dL Final    Comment:           Total Cholesterol/HDL:CHD Risk Coronary Heart Disease Risk Table                     Men   Women  1/2 Average Risk   3.4   3.3  Average Risk       5.0   4.4  2 X Average Risk   9.6   7.1  3 X Average Risk  23.4   11.0        Use the calculated Patient Ratio above and the CHD Risk Table to determine the patient's CHD Risk.        ATP III CLASSIFICATION (LDL):  <100     mg/dL   Optimal  100-129  mg/dL   Near or Above                    Optimal  130-159  mg/dL   Borderline  160-189  mg/dL   High  >190     mg/dL   Very High Performed at [facility] [address], [place], [place] [address]           Passed - Triglycerides in normal range and within 360 days    Triglycerides  Date Value Ref Range Status  07/10/2020 106 <150 mg/dL Final          Passed - Patient is not pregnant      Passed - Valid encounter within last 12 months    Recent Outpatient Visits           2 weeks ago Essential hypertension   [place] Kerin Perna, NP   2 months ago Essential hypertension   [place], Michelle P, NP   3 months ago Bilateral  impacted cerumen   [place] Kerin Perna, NP   4 months ago Need for Tdap vaccination   [place], NP       Future Appointments             In 2 weeks Oletta Lamas Milford Cage, NP [place]

## 2020-10-21 NOTE — Telephone Encounter (Signed)
Medication Refill - Medication: atorvastatin (LIPITOR) 20 MG tablet   Has the patient contacted their pharmacy? Yes.   (Agent: If no, request that the patient contact the pharmacy for the refill.) (Agent: If yes, when and what did the pharmacy advise?) no refills   Preferred Pharmacy (with phone number or street name): [place] ([place]), [place] - [address]  [address] [facility] [place] ([place]) [place] [address]  Phone:  [phone]  Fax:  [phone]   Agent: Please be advised that RX refills may take up to 3 business days. We ask that you follow-up with your pharmacy.

## 2020-10-25 MED ORDER — ATORVASTATIN CALCIUM 20 MG PO TABS: 20.0000 mg | ORAL_TABLET | Freq: Every day | ORAL | 0 refills | Status: DC

## 2020-11-04 ENCOUNTER — Ambulatory Visit (INDEPENDENT_AMBULATORY_CARE_PROVIDER_SITE_OTHER): Payer: Self-pay | Admitting: Primary Care

## 2020-11-11 ENCOUNTER — Other Ambulatory Visit (INDEPENDENT_AMBULATORY_CARE_PROVIDER_SITE_OTHER): Payer: Self-pay | Admitting: Primary Care

## 2020-11-11 DIAGNOSIS — I6300 Cerebral infarction due to thrombosis of unspecified precerebral artery: Secondary | ICD-10-CM

## 2020-11-11 MED ORDER — ATORVASTATIN CALCIUM 20 MG PO TABS: 20.0000 mg | ORAL_TABLET | Freq: Every day | ORAL | 0 refills | Status: DC

## 2020-11-11 NOTE — Telephone Encounter (Signed)
Medication Refill - Medication: atorvastatin (LIPITOR) 20 MG tablet   Has the patient contacted their pharmacy? No.  Preferred Pharmacy (with phone number or street name):  [place] ([place]), [place] - [address] Phone:  [phone]  Fax:  [phone]      Agent: Please be advised that RX refills may take up to 3 business days. We ask that you follow-up with your pharmacy.

## 2020-11-15 ENCOUNTER — Other Ambulatory Visit: Payer: Self-pay

## 2020-11-15 ENCOUNTER — Encounter (INDEPENDENT_AMBULATORY_CARE_PROVIDER_SITE_OTHER): Payer: Self-pay | Admitting: Primary Care

## 2020-11-15 ENCOUNTER — Ambulatory Visit (INDEPENDENT_AMBULATORY_CARE_PROVIDER_SITE_OTHER): Payer: Medicare Other | Admitting: Primary Care

## 2020-11-15 DIAGNOSIS — I10 Essential (primary) hypertension: Secondary | ICD-10-CM | POA: Diagnosis not present

## 2020-11-15 DIAGNOSIS — I6300 Cerebral infarction due to thrombosis of unspecified precerebral artery: Secondary | ICD-10-CM | POA: Diagnosis not present

## 2020-11-15 MED ORDER — ATORVASTATIN CALCIUM 20 MG PO TABS: 20.0000 mg | ORAL_TABLET | Freq: Every day | ORAL | 1 refills | Status: DC

## 2020-11-15 MED ORDER — AMLODIPINE BESYLATE 10 MG PO TABS: 10.0000 mg | ORAL_TABLET | Freq: Every day | ORAL | 0 refills | Status: DC

## 2020-11-15 NOTE — Progress Notes (Signed)
Gavin Dennis is a 70 y.o. male presents for hypertension evaluation, Denies shortness of breath, headaches, chest pain or lower extremity edema, sudden onset, vision changes, unilateral weakness, dizziness, paresthesias   Patient reports adherence with medications. Admits he only missed 1 day of taking his medication- but tries to take medication daily.   Dietary habits include: trying to eat healthier  Exercise habits include:walking Family / Social history: None   Past Medical History:  Diagnosis Date   Hypertension    Polio    No past surgical history on file. No Known Allergies Current Outpatient Medications on File Prior to Visit  Medication Sig Dispense Refill   aspirin EC 81 MG EC tablet Take 1 tablet (81 mg total) by mouth daily. Swallow whole. 30 tablet 0   atorvastatin (LIPITOR) 20 MG tablet Take 1 tablet (20 mg total) by mouth daily. 30 tablet 0   hydrochlorothiazide (HYDRODIURIL) 25 MG tablet Take 1 tablet (25 mg total) by mouth daily. 90 tablet 3   amLODipine (NORVASC) 10 MG tablet Take 1 tablet (10 mg total) by mouth daily. 90 tablet 0   No current facility-administered medications on file prior to visit.   Social History   Socioeconomic History   Marital status: Married    Spouse name: Not on file   Number of children: Not on file   Years of education: Not on file   Highest education level: Not on file  Occupational History   Not on file  Tobacco Use   Smoking status: Former    Years: 30.00    Types: Cigarettes   Smokeless tobacco: Never   Tobacco comments:    quit a mo ago  Substance and Sexual Activity   Alcohol use: Not Currently    Comment: quit a mo ago   Drug use: Not Currently    Types: Cocaine   Sexual activity: Not on file  Other Topics Concern   Not on file  Social History Narrative   Not on file   Social Determinants of Health   Financial Resource Strain: Not on file  Food Insecurity: Not on file   Transportation Needs: Not on file  Physical Activity: Not on file  Stress: Not on file  Social Connections: Not on file  Intimate Partner Violence: Not on file   No family history on file.   OBJECTIVE:  Vitals:   11/15/20 1547 11/15/20 1602  BP: (!) 151/108 (!) 139/98  Pulse: 66 64  Temp: (!) 97.3 F (36.3 C)   TempSrc: Temporal   SpO2: 94%   Weight: 198 lb 6.4 oz (90 kg)   Height: 5' 11" (1.803 m)     Physical Exam Vitals reviewed.  Constitutional:      Appearance: Normal appearance.  HENT:     Head: Normocephalic.     Right Ear: External ear normal.     Left Ear: External ear normal.     Nose: Nose normal.  Eyes:     Extraocular Movements: Extraocular movements intact.  Cardiovascular:     Rate and Rhythm: Normal rate and regular rhythm.  Pulmonary:     Effort: Pulmonary effort is normal.     Breath sounds: Normal breath sounds.  Abdominal:     General: Bowel sounds are normal.     Palpations: Abdomen is soft.  Musculoskeletal:        General: Normal range of motion.  Skin:    General: Skin is warm and dry.  Neurological:  Mental Status: He is alert and oriented to person, place, and time.  Psychiatric:        Mood and Affect: Mood normal.        Behavior: Behavior normal.        Thought Content: Thought content normal.        Judgment: Judgment normal.     Review of Systems  Neurological:  Positive for dizziness.       After reviewing causes patient accidentally took 2 of the same medication.  All other systems reviewed and are negative.  Last 3 Office BP readings: BP Readings from Last 3 Encounters:  11/15/20 (!) 139/98  10/06/20 (!) 148/108  07/30/20 (!) 134/94    BMET    Component Value Date/Time   NA 138 07/09/2020 2149   K 3.8 07/09/2020 2149   CL 105 07/09/2020 2149   CO2 27 07/09/2020 2149   GLUCOSE 98 07/09/2020 2149   BUN 23 07/09/2020 2149   CREATININE 1.15 07/09/2020 2149   CALCIUM 10.2 07/09/2020 2149   GFRNONAA >60  07/09/2020 2149   GFRAA  09/22/2010 0448    >60        The eGFR has been calculated using the MDRD equation. This calculation has not been validated in all clinical situations. eGFR's persistently <60 mL/min signify possible Chronic Kidney Disease.    Renal function: CrCl cannot be calculated (Patient's most recent lab result is older than the maximum 21 days allowed.).  Clinical ASCVD: No  The ASCVD Risk score (Goff DC Jr., et al., 2013) failed to calculate for the following reasons:   The patient has a prior MI or stroke diagnosis  ASCVD risk factors include- CHAD   ASSESSMENT & PLAN: Gavin Dennis was seen today for hypertension and dizziness.  Diagnoses and all orders for this visit:  Cerebrovascular accident (CVA) due to thrombosis of precerebral artery (HCC) -     atorvastatin (LIPITOR) 20 MG tablet; Take 1 tablet (20 mg total) by mouth daily.   Essential hypertension -Counseled on lifestyle modifications for blood pressure control including reduced dietary sodium, increased exercise, weight reduction and adequate sleep. Also, educated patient about the risk for cardiovascular events, stroke and heart attack. Also counseled patient about the importance of medication adherence. If you participate in smoking, it is important to stop using tobacco as this will increase the risks associated with uncontrolled blood pressure.   -Hypertension longstanding diagnosed currently amLODipine (NORVASC) 10 MG tablet; Take 1 tablet (10 mg total) by mouth daily.and HCTZ 25mg daily  on current medications. Patient is adherent with current medications.   Goal BP:  For patients younger than 60: Goal BP < 130/80. For patients 60 and older: Goal BP < 140/90. For patients with diabetes: Goal BP < 130/80. Your most recent BP: 139/98  Minimize salt intake. Minimize alcohol intake    This note has been created with Dragon speech recognition software and smart phrase technology. Any transcriptional  errors are unintentional.    P , NP 11/15/2020, 4:09 PM   

## 2020-11-15 NOTE — Patient Instructions (Signed)

## 2021-01-13 DIAGNOSIS — E538 Deficiency of other specified B group vitamins: Secondary | ICD-10-CM | POA: Diagnosis not present

## 2021-01-13 DIAGNOSIS — R7303 Prediabetes: Secondary | ICD-10-CM | POA: Diagnosis not present

## 2021-01-13 DIAGNOSIS — I10 Essential (primary) hypertension: Secondary | ICD-10-CM | POA: Diagnosis not present

## 2021-01-13 DIAGNOSIS — E7849 Other hyperlipidemia: Secondary | ICD-10-CM | POA: Diagnosis not present

## 2021-01-13 DIAGNOSIS — M5030 Other cervical disc degeneration, unspecified cervical region: Secondary | ICD-10-CM | POA: Diagnosis not present

## 2021-01-13 DIAGNOSIS — Z8673 Personal history of transient ischemic attack (TIA), and cerebral infarction without residual deficits: Secondary | ICD-10-CM | POA: Diagnosis not present

## 2021-01-13 DIAGNOSIS — M5136 Other intervertebral disc degeneration, lumbar region: Secondary | ICD-10-CM | POA: Diagnosis not present

## 2021-02-01 ENCOUNTER — Encounter: Payer: Self-pay | Admitting: *Deleted

## 2021-02-02 ENCOUNTER — Encounter: Payer: Self-pay | Admitting: Diagnostic Neuroimaging

## 2021-02-02 ENCOUNTER — Ambulatory Visit: Payer: Medicare HMO | Admitting: Diagnostic Neuroimaging

## 2021-02-02 VITALS — BP 151/104 | HR 63 | Ht 71.0 in | Wt 200.2 lb

## 2021-02-02 DIAGNOSIS — I6300 Cerebral infarction due to thrombosis of unspecified precerebral artery: Secondary | ICD-10-CM | POA: Diagnosis not present

## 2021-02-02 NOTE — Progress Notes (Signed)
[facility]  PATIENT: Gavin Dennis DOB: [birth year]  REFERRING CLINICIAN: Pahwani, Michell Heinrich, MD HISTORY FROM: patient  REASON FOR VISIT: new consult    HISTORICAL  CHIEF COMPLAINT:  Chief Complaint  Patient presents with   Cerebrovascular Accident    Rm 7 New Pt  friend/driver- Iona Beard  "blurred vision in left eye since stroke, no other problems related to stroke;  ran out of ASA"     HISTORY OF PRESENT ILLNESS:   70 year old male here for evaluation of stroke.  March 2022 patient woke up with acute onset of vision changes.  Went to the hospital for evaluation.  Was diagnosed with multiple bilateral ischemic infarctions in the setting of dehydration and COVID-19 infection.  Stroke work-up was completed.  He was diagnosed with bilateral microvascular infarcts related to hypertension. He was discharged on dual antiplatelet therapy for 3 weeks and then aspirin 81 mg daily alone. Vascular risk factors were evaluated and treated.  Since that time patient is stable.  No recurrent symptoms.  Patient has improved but still is slightly off.  Now has new PCP.   REVIEW OF SYSTEMS: Full 14 system review of systems performed and negative with exception of: as per HPI.  ALLERGIES: No Known Allergies  HOME MEDICATIONS: Outpatient Medications Prior to Visit  Medication Sig Dispense Refill   amLODipine (NORVASC) 10 MG tablet Take 1 tablet (10 mg total) by mouth daily. 90 tablet 0   atorvastatin (LIPITOR) 20 MG tablet Take 1 tablet (20 mg total) by mouth daily. 90 tablet 1   hydrochlorothiazide (HYDRODIURIL) 25 MG tablet Take 1 tablet (25 mg total) by mouth daily. 90 tablet 3   metFORMIN (GLUCOPHAGE) 500 MG tablet Take by mouth 2 (two) times daily with a meal.     vitamin B-12 (CYANOCOBALAMIN) 1000 MCG tablet Take 3,000 mcg by mouth daily.     aspirin EC 81 MG EC tablet Take 1 tablet (81 mg total) by mouth daily. Swallow whole. (Patient not taking: Reported on 02/02/2021) 30  tablet 0   No facility-administered medications prior to visit.    PAST MEDICAL HISTORY: Past Medical History:  Diagnosis Date   Blurred vision, left eye    since stroke 06/2020   CVA (cerebral vascular accident) ([place]) 06/2020   DDD (degenerative disc disease), lumbar    Hyperlipidemia    Hypertension    Hypomagnesemia    Polio    childhood   Vitamin B 12 deficiency     PAST SURGICAL HISTORY: No past surgical history on file.  FAMILY HISTORY: Family History  Problem Relation Age of Onset   Lung cancer Brother     SOCIAL HISTORY: Social History   Socioeconomic History   Marital status: Single    Spouse name: Not on file   Number of children: 4   Years of education: 6   Highest education level: 6th grade  Occupational History    Comment: retired  Tobacco Use   Smoking status: Former    Years: 30.00    Types: Cigarettes    Quit date: 05/02/2019    Years since quitting: 1.7   Smokeless tobacco: Never   Tobacco comments:    quit a mo ago  Substance and Sexual Activity   Alcohol use: Not Currently    Comment: quit a mo ago   Drug use: Not Currently    Types: Cocaine    Comment: hx of abuse   Sexual activity: Not on file  Other Topics Concern  Not on file  Social History Narrative   02/02/21 lives alone   Very little caffeine   Social Determinants of Health   Financial Resource Strain: Not on file  Food Insecurity: Not on file  Transportation Needs: Not on file  Physical Activity: Not on file  Stress: Not on file  Social Connections: Not on file  Intimate Partner Violence: Not on file     PHYSICAL EXAM  GENERAL EXAM/CONSTITUTIONAL: Vitals:  Vitals:   02/02/21 0901  BP: (!) 151/104  Pulse: 63  Weight: 200 lb 3.2 oz (90.8 kg)  Height: 5' 11" (1.803 m)   Body mass index is 27.92 kg/m. Wt Readings from Last 3 Encounters:  02/02/21 200 lb 3.2 oz (90.8 kg)  11/15/20 198 lb 6.4 oz (90 kg)  10/06/20 195 lb 9.6 oz (88.7 kg)   Patient is in no  distress; well developed, nourished and groomed; neck is supple  CARDIOVASCULAR: Examination of carotid arteries is normal; no carotid bruits Regular rate and rhythm, no murmurs Examination of peripheral vascular system by observation and palpation is normal  EYES: Ophthalmoscopic exam of optic discs and posterior segments is normal; no papilledema or hemorrhages No results found.  MUSCULOSKELETAL: Gait, strength, tone, movements noted in Neurologic exam below  NEUROLOGIC: MENTAL STATUS:  No flowsheet data found. awake, alert, oriented to person, place and time recent and remote memory intact normal attention and concentration language fluent, comprehension intact, naming intact fund of knowledge appropriate  CRANIAL NERVE:  2nd - no papilledema on fundoscopic exam 2nd, 3rd, 4th, 6th - pupils equal and reactive to light, visual fields full to confrontation, extraocular muscles intact, no nystagmus 5th - facial sensation symmetric 7th - facial strength symmetric 8th - hearing intact 9th - palate elevates symmetrically, uvula midline 11th - shoulder shrug symmetric 12th - tongue protrusion midline  MOTOR:  normal bulk and tone, full strength in the BUE, BLE  SENSORY:  normal and symmetric to light touch, temperature, vibration  COORDINATION:  finger-nose-finger, fine finger movements normal  REFLEXES:  deep tendon reflexes TRACE and symmetric  GAIT/STATION:  narrow based gait     DIAGNOSTIC DATA (LABS, IMAGING, TESTING) - I reviewed patient records, labs, notes, testing and imaging myself where available.  Lab Results  Component Value Date   WBC 6.1 07/09/2020   HGB 16.0 07/09/2020   HCT 47.0 07/09/2020   MCV 91.4 07/09/2020   PLT 215 07/09/2020      Component Value Date/Time   NA 138 07/09/2020 2149   K 3.8 07/09/2020 2149   CL 105 07/09/2020 2149   CO2 27 07/09/2020 2149   GLUCOSE 98 07/09/2020 2149   BUN 23 07/09/2020 2149   CREATININE 1.15  07/09/2020 2149   CALCIUM 10.2 07/09/2020 2149   PROT 6.8 07/09/2020 2149   ALBUMIN 3.3 (L) 07/09/2020 2149   AST 37 07/09/2020 2149   ALT 83 (H) 07/09/2020 2149   ALKPHOS 109 07/09/2020 2149   BILITOT 0.9 07/09/2020 2149   GFRNONAA >60 07/09/2020 2149   GFRAA  09/22/2010 0448    >60        The eGFR has been calculated using the MDRD equation. This calculation has not been validated in all clinical situations. eGFR's persistently <60 mL/min signify possible Chronic Kidney Disease.   Lab Results  Component Value Date   CHOL 135 07/10/2020   HDL 39 (L) 07/10/2020   LDLCALC 75 07/10/2020   TRIG 106 07/10/2020   CHOLHDL 3.5 07/10/2020   Lab Results  Component Value Date   HGBA1C 6.3 (H) 07/10/2020   Lab Results  Component Value Date   VITAMINB12 142 (L) 07/09/2020   Lab Results  Component Value Date   TSH 1.812 07/10/2020    March 2022 stroke admission  MRI brain: Multiple punctate infarctions in cerebral hemispheres with microembolic infarctions from heart or a sending aorta.   MRA brain: Negative.   CTA head / neck: Mild atherosclerosis in the head and neck without large vessel occlusion or significant proximal stenosis. 2. Aortic Atherosclerosis (ICD10-I70.0) and Emphysema (ICD10-J43.9).  Echo on 3/11: EF 55 to 60%, mild LVH, grade 1 diastolic dysfunction.  A1c-6.3.   Lipid panel-HDL 39, LDL 75    ASSESSMENT AND PLAN  70 y.o. year old male here with:   Dx:  1. Cerebrovascular accident (CVA) due to thrombosis of precerebral artery (HCC)       PLAN:  STROKE PREVENTION (multiple small vessel dz / thrombosis) - continue aspirin 85m daily - continue BP control, diabetes control - continue statin  Return for pending if symptoms worsen or fail to improve, return to PCP.    VPenni Bombard MD 120/12/4707 162:83AM Certified in Neurology, Neurophysiology and Neuroimaging  [facility] [id number] [place] [phone]

## 2021-02-02 NOTE — Patient Instructions (Addendum)
-   continue aspirin 81mg  daily (over the counter)  - continue other current meds (BP control, diabetes control, statin)

## 2021-02-24 DIAGNOSIS — E538 Deficiency of other specified B group vitamins: Secondary | ICD-10-CM | POA: Diagnosis not present

## 2021-02-24 DIAGNOSIS — Z7984 Long term (current) use of oral hypoglycemic drugs: Secondary | ICD-10-CM | POA: Diagnosis not present

## 2021-02-24 DIAGNOSIS — Z8673 Personal history of transient ischemic attack (TIA), and cerebral infarction without residual deficits: Secondary | ICD-10-CM | POA: Diagnosis not present

## 2021-02-24 DIAGNOSIS — E7849 Other hyperlipidemia: Secondary | ICD-10-CM | POA: Diagnosis not present

## 2021-02-24 DIAGNOSIS — E1169 Type 2 diabetes mellitus with other specified complication: Secondary | ICD-10-CM | POA: Diagnosis not present

## 2021-02-24 DIAGNOSIS — Z1211 Encounter for screening for malignant neoplasm of colon: Secondary | ICD-10-CM | POA: Diagnosis not present

## 2021-02-24 DIAGNOSIS — I10 Essential (primary) hypertension: Secondary | ICD-10-CM | POA: Diagnosis not present

## 2021-05-27 DIAGNOSIS — M5136 Other intervertebral disc degeneration, lumbar region: Secondary | ICD-10-CM | POA: Diagnosis not present

## 2021-05-27 DIAGNOSIS — E7849 Other hyperlipidemia: Secondary | ICD-10-CM | POA: Diagnosis not present

## 2021-05-27 DIAGNOSIS — Z0000 Encounter for general adult medical examination without abnormal findings: Secondary | ICD-10-CM | POA: Diagnosis not present

## 2021-05-27 DIAGNOSIS — E1169 Type 2 diabetes mellitus with other specified complication: Secondary | ICD-10-CM | POA: Diagnosis not present

## 2021-05-27 DIAGNOSIS — I700 Atherosclerosis of aorta: Secondary | ICD-10-CM | POA: Diagnosis not present

## 2021-05-27 DIAGNOSIS — M5030 Other cervical disc degeneration, unspecified cervical region: Secondary | ICD-10-CM | POA: Diagnosis not present

## 2021-05-27 DIAGNOSIS — Z7984 Long term (current) use of oral hypoglycemic drugs: Secondary | ICD-10-CM | POA: Diagnosis not present

## 2021-05-27 DIAGNOSIS — I10 Essential (primary) hypertension: Secondary | ICD-10-CM | POA: Diagnosis not present

## 2021-05-27 DIAGNOSIS — J439 Emphysema, unspecified: Secondary | ICD-10-CM | POA: Diagnosis not present

## 2021-05-27 DIAGNOSIS — Z125 Encounter for screening for malignant neoplasm of prostate: Secondary | ICD-10-CM | POA: Diagnosis not present

## 2021-05-27 DIAGNOSIS — E119 Type 2 diabetes mellitus without complications: Secondary | ICD-10-CM | POA: Diagnosis not present

## 2021-05-31 ENCOUNTER — Other Ambulatory Visit: Payer: Self-pay | Admitting: Internal Medicine

## 2021-05-31 DIAGNOSIS — Z136 Encounter for screening for cardiovascular disorders: Secondary | ICD-10-CM

## 2021-06-01 ENCOUNTER — Other Ambulatory Visit: Payer: Self-pay | Admitting: Internal Medicine

## 2021-06-01 DIAGNOSIS — Z122 Encounter for screening for malignant neoplasm of respiratory organs: Secondary | ICD-10-CM

## 2021-06-03 ENCOUNTER — Other Ambulatory Visit: Payer: Self-pay | Admitting: Internal Medicine

## 2021-06-03 DIAGNOSIS — Z13820 Encounter for screening for osteoporosis: Secondary | ICD-10-CM

## 2021-06-20 ENCOUNTER — Ambulatory Visit: Payer: Medicare Other

## 2021-06-23 ENCOUNTER — Ambulatory Visit
Admission: RE | Admit: 2021-06-23 | Discharge: 2021-06-23 | Disposition: A | Discharge status: Home / Self Care | Payer: Medicare HMO | Source: Ambulatory Visit | Attending: Internal Medicine | Admitting: Internal Medicine

## 2021-06-23 ENCOUNTER — Other Ambulatory Visit: Payer: Self-pay

## 2021-06-23 DIAGNOSIS — Z87891 Personal history of nicotine dependence: Secondary | ICD-10-CM | POA: Diagnosis not present

## 2021-06-23 DIAGNOSIS — Z136 Encounter for screening for cardiovascular disorders: Secondary | ICD-10-CM | POA: Diagnosis not present

## 2021-06-23 IMAGING — US US ABDOMINAL AORTA SCREENING AAA
1 series · 14 of 25 positions shown · non-contrast
Comparison: None.

CLINICAL DATA: Male between 65-75 years of age with a smoking
history.

EXAM:
US ABDOMINAL AORTA MEDICARE SCREENING
TECHNIQUE: Ultrasound examination of the abdominal aorta was performed as a
screening evaluation for abdominal aortic aneurysm.

[Series 1: us abdominal aorta screening aaa · 0.33mm/px · 14 of 28 slices shown]
[im 1/28]
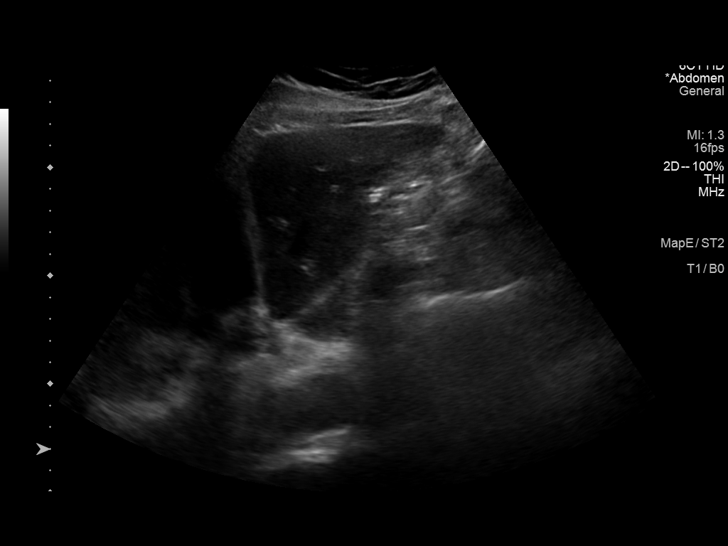
[im 3/28]
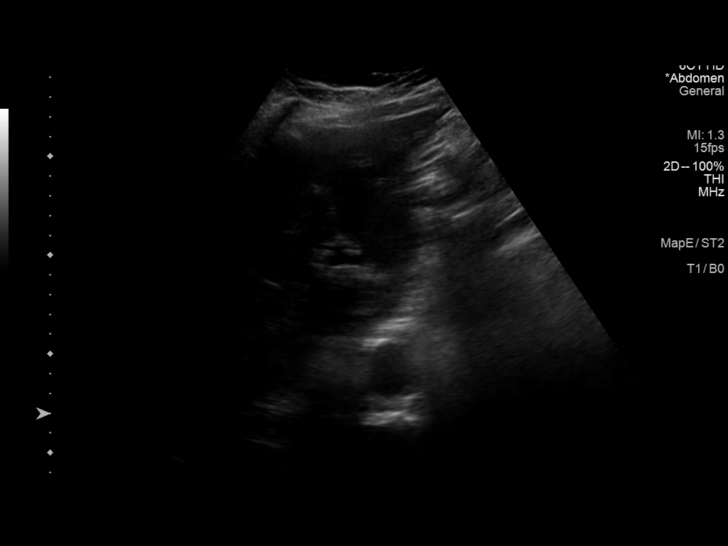
[im 5/28]
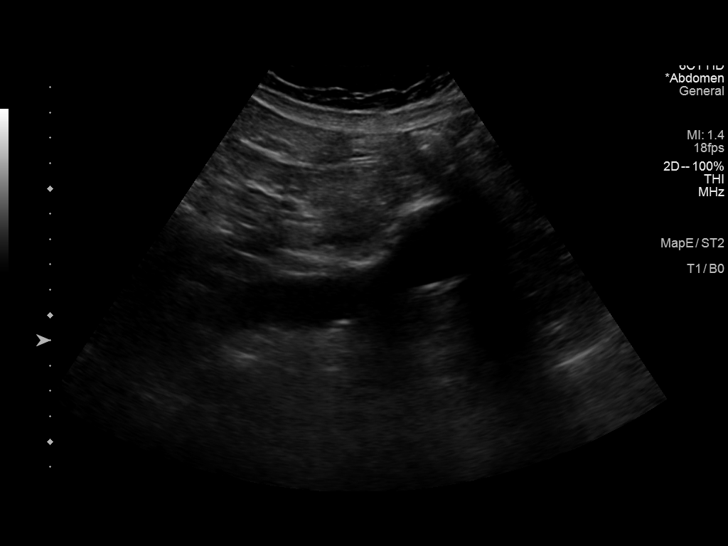
[im 7/28]
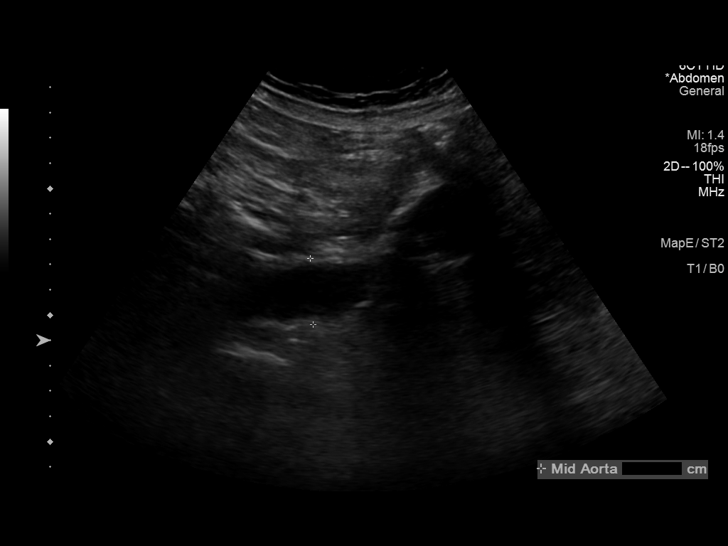
[im 10/28]
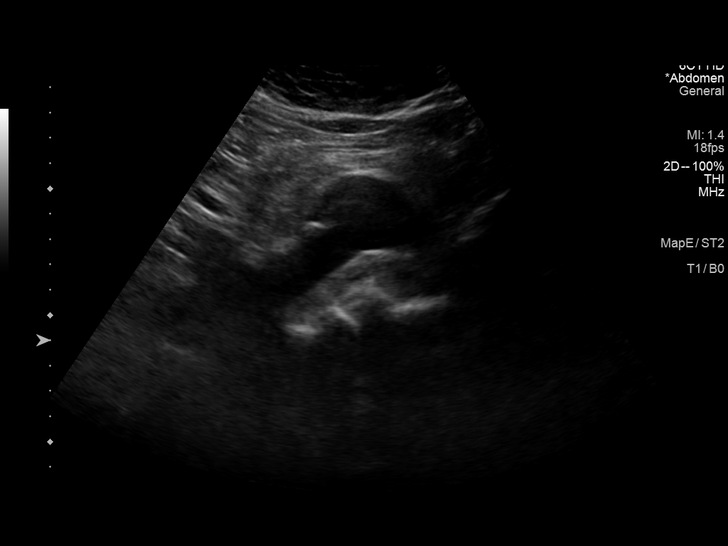
[im 11/28]
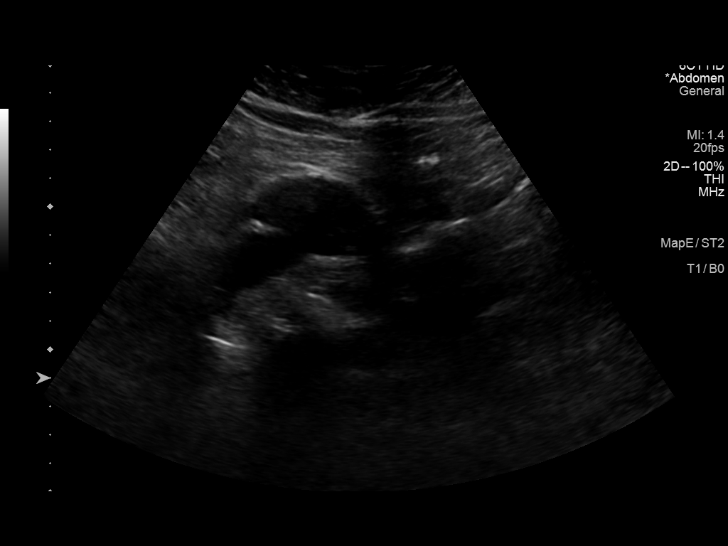
[im 13/28]
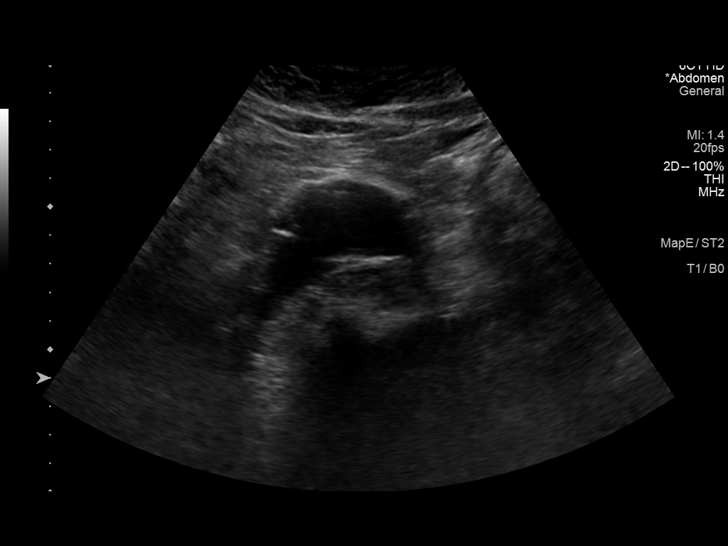
[im 15/28]
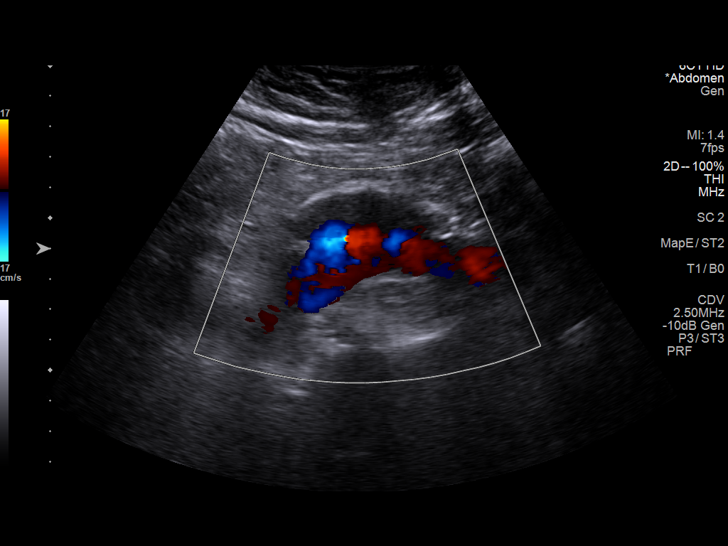
[im 17/28]
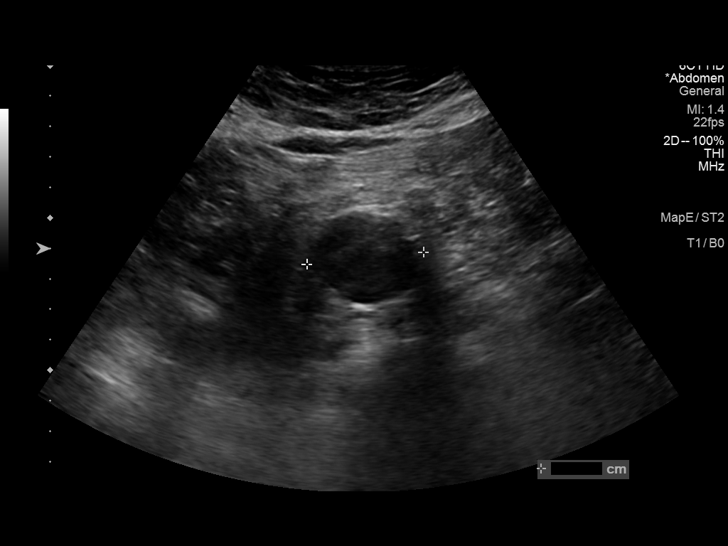
[im 19/28]
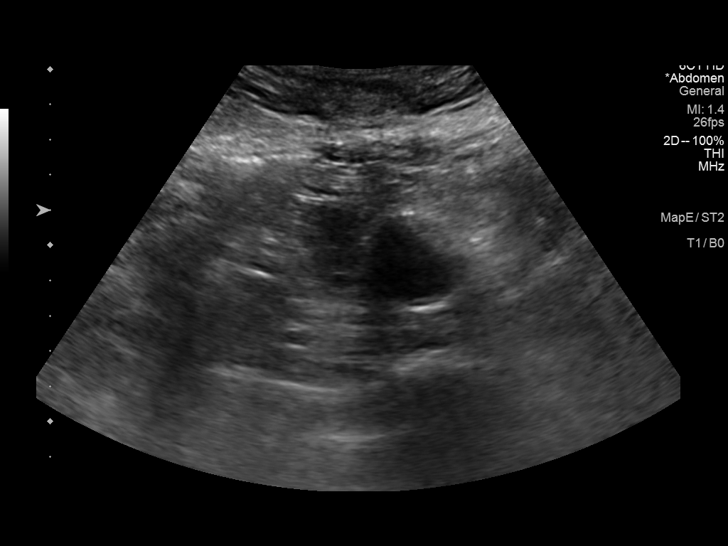
[im 21/28]
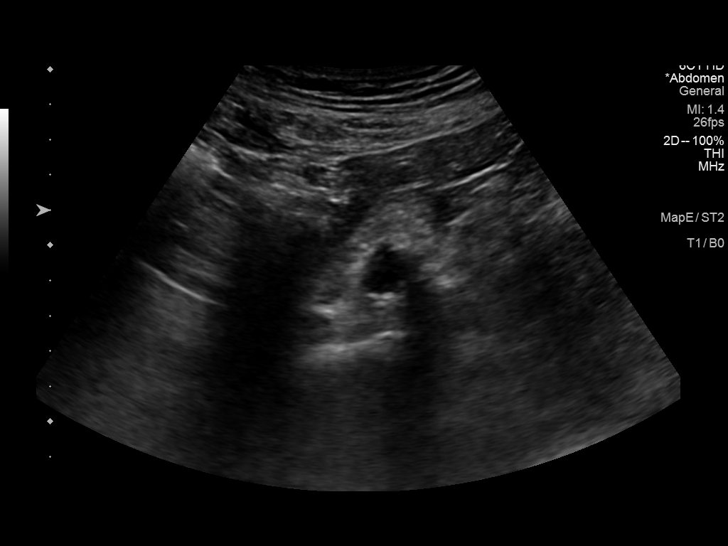
[im 23/28]
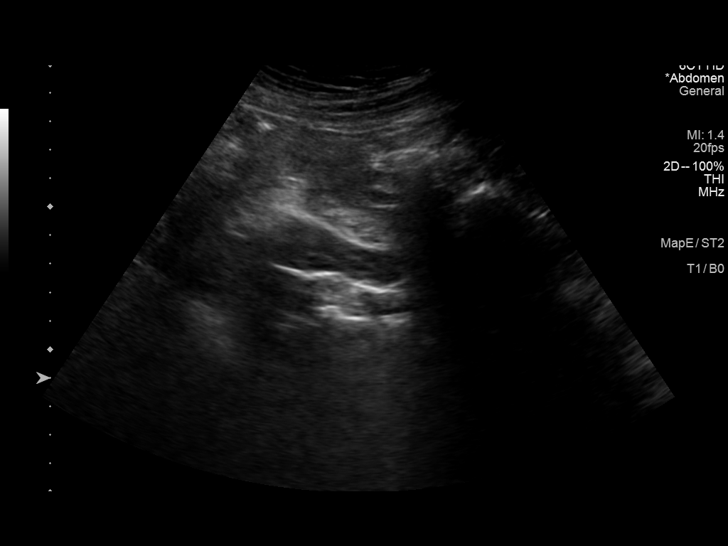
[im 25/28]
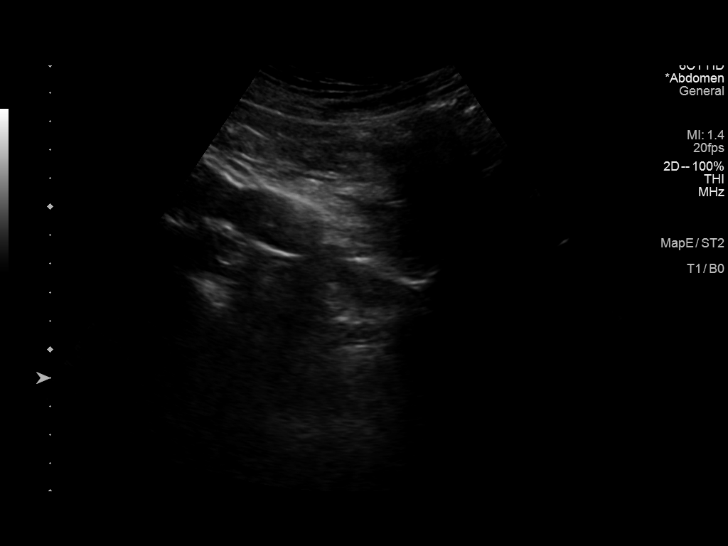
[im 28/28]
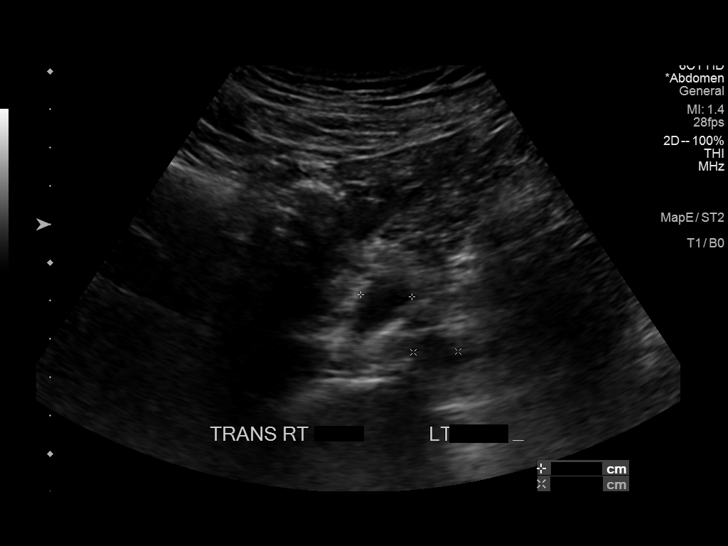

[14 of 25 positions shown; findings below may reference images not displayed]

FINDINGS: Abdominal aortic measurements as follows:

Proximal:  3.0 x 3.0 cm

Mid:  2.6 x 2.8 cm

Distal:  3.5 x 3.7 cm

No visible aneurysmal disease of either proximal common iliac
artery.
IMPRESSION: Distal abdominal aortic aneurysm measuring approximately 3.7 cm in
greatest diameter. Recommend follow-up ultrasound every 2 years.
This recommendation follows ACR consensus guidelines: White Paper of
the ACR Incidental Findings Committee II on Vascular Findings. [HOSPITAL] [CO]; [DATE].

## 2021-07-01 ENCOUNTER — Ambulatory Visit
Admission: RE | Admit: 2021-07-01 | Discharge: 2021-07-01 | Disposition: A | Discharge status: Home / Self Care | Payer: Medicare HMO | Source: Ambulatory Visit | Attending: Internal Medicine | Admitting: Internal Medicine

## 2021-07-01 DIAGNOSIS — Z122 Encounter for screening for malignant neoplasm of respiratory organs: Secondary | ICD-10-CM

## 2021-07-01 DIAGNOSIS — Z87891 Personal history of nicotine dependence: Secondary | ICD-10-CM | POA: Diagnosis not present

## 2021-07-15 DIAGNOSIS — E7849 Other hyperlipidemia: Secondary | ICD-10-CM | POA: Diagnosis not present

## 2021-07-15 DIAGNOSIS — E1169 Type 2 diabetes mellitus with other specified complication: Secondary | ICD-10-CM | POA: Diagnosis not present

## 2021-07-15 DIAGNOSIS — I700 Atherosclerosis of aorta: Secondary | ICD-10-CM | POA: Diagnosis not present

## 2021-07-15 DIAGNOSIS — J439 Emphysema, unspecified: Secondary | ICD-10-CM | POA: Diagnosis not present

## 2021-07-15 DIAGNOSIS — I7121 Aneurysm of the ascending aorta, without rupture: Secondary | ICD-10-CM | POA: Diagnosis not present

## 2021-07-15 DIAGNOSIS — R0602 Shortness of breath: Secondary | ICD-10-CM | POA: Diagnosis not present

## 2021-07-15 DIAGNOSIS — I10 Essential (primary) hypertension: Secondary | ICD-10-CM | POA: Diagnosis not present

## 2021-07-15 DIAGNOSIS — R918 Other nonspecific abnormal finding of lung field: Secondary | ICD-10-CM | POA: Diagnosis not present

## 2021-07-29 ENCOUNTER — Encounter (INDEPENDENT_AMBULATORY_CARE_PROVIDER_SITE_OTHER): Payer: Self-pay

## 2021-07-29 ENCOUNTER — Ambulatory Visit (INDEPENDENT_AMBULATORY_CARE_PROVIDER_SITE_OTHER): Payer: Medicare HMO

## 2021-07-29 VITALS — Ht 71.0 in

## 2021-07-29 DIAGNOSIS — Z0000 Encounter for general adult medical examination without abnormal findings: Secondary | ICD-10-CM

## 2021-07-29 NOTE — Progress Notes (Addendum)
? ?Subjective:  ? Gavin Dennis is a 71 y.o. male who presents for an Initial Medicare Annual Wellness Visit. ? ?Review of Systems    ?I connected with  Gavin Dennis on 07/29/2021 by a audio enabled telemedicine application and verified that I am speaking with the correct person using two identifiers. ? ?Patient Location: Home ? ?Provider Location: Office/Clinic ? ?I discussed the limitations of evaluation and management by telemedicine. The patient expressed understanding and agreed to proceed.  ?  ? ?   ?Objective:  ?  ?There were no vitals filed for this visit. ?There is no height or weight on file to calculate BMI. ? ? ?  07/09/2020  ?  9:11 PM 06/04/2020  ?  7:19 PM  ?Advanced Directives  ?Does Patient Have a Medical Advance Directive? No No  ?Would patient like information on creating a medical advance directive? No - Patient declined No - Patient declined  ? ? ?Current Medications (verified) ?Outpatient Encounter Medications as of 07/29/2021  ?Medication Sig  ? amLODipine (NORVASC) 10 MG tablet Take 1 tablet (10 mg total) by mouth daily.  ? aspirin EC 81 MG EC tablet Take 1 tablet (81 mg total) by mouth daily. Swallow whole.  ? atorvastatin (LIPITOR) 20 MG tablet Take 1 tablet (20 mg total) by mouth daily.  ? hydrochlorothiazide (HYDRODIURIL) 25 MG tablet Take 1 tablet (25 mg total) by mouth daily.  ? metFORMIN (GLUCOPHAGE) 500 MG tablet Take 500 mg by mouth daily.  ? vitamin B-12 (CYANOCOBALAMIN) 500 MCG tablet Take 500 mcg by mouth daily.  ? ?No facility-administered encounter medications on file as of 07/29/2021.  ? ? ?Allergies (verified) ?Patient has no known allergies.  ? ?History: ?Past Medical History:  ?Diagnosis Date  ? Blurred vision, left eye   ? since stroke 06/2020  ? CVA (cerebral vascular accident) ([place]) 06/2020  ? DDD (degenerative disc disease), lumbar   ? Hyperlipidemia   ? Hypertension   ? Hypomagnesemia   ? Polio   ? childhood  ? Vitamin B 12 deficiency   ? ?No past surgical history on  file. ?Family History  ?Problem Relation Age of Onset  ? Heart Problems Mother   ? Prostate cancer Father   ? Lung cancer Brother   ? ?Social History  ? ?Socioeconomic History  ? Marital status: Single  ?  Spouse name: Not on file  ? Number of children: 4  ? Years of education: 6  ? Highest education level: 6th grade  ?Occupational History  ?  Comment: retired  ?Tobacco Use  ? Smoking status: Former  ?  Packs/day: 1.00  ?  Years: 30.00  ?  Pack years: 30.00  ?  Types: Cigarettes  ?  Quit date: 05/02/2019  ?  Years since quitting: 2.2  ? Smokeless tobacco: Never  ? Tobacco comments:  ?  quit a mo ago  ?Substance and Sexual Activity  ? Alcohol use: Not Currently  ?  Comment: quit a mo ago  ? Drug use: Not Currently  ?  Types: Cocaine  ?  Comment: hx of abuse  ? Sexual activity: Not on file  ?Other Topics Concern  ? Not on file  ?Social History Narrative  ? 02/02/21 lives alone  ? Very little caffeine  ? ?Social Determinants of Health  ? ?Financial Resource Strain: High Risk  ? Difficulty of Paying Living Expenses: Hard  ?Food Insecurity: Food Insecurity Present  ? Worried About Charity fundraiser in the Last Year: Sometimes  true  ? Ran Out of Food in the Last Year: Sometimes true  ?Transportation Needs: No Transportation Needs  ? Lack of Transportation (Medical): No  ? Lack of Transportation (Non-Medical): No  ?Physical Activity: Insufficiently Active  ? Days of Exercise per Week: 7 days  ? Minutes of Exercise per Session: 10 min  ?Stress: No Stress Concern Present  ? Feeling of Stress : Not at all  ?Social Connections: Moderately Integrated  ? Frequency of Communication with Friends and Family: More than three times a week  ? Frequency of Social Gatherings with Friends and Family: More than three times a week  ? Attends Religious Services: More than 4 times per year  ? Active Member of Clubs or Organizations: Yes  ? Attends Archivist Meetings: More than 4 times per year  ? Marital Status: Never married   ? ? ?Tobacco Counseling ?Counseling given: Not Answered ?Tobacco comments: quit a mo ago ? ? ?Clinical Intake: ? ?  ? ?  ? ?  ? ?  ? ?  ? ?Diabetic?Yes ? ?  ? ?  ? ? ?Activities of Daily Living ?   ? View : No data to display.  ?  ?  ?  ? ? ?Patient Care Team: ?Mckinley Jewel, MD as PCP - General (Internal Medicine) ? ?Indicate any recent Medical Services you may have received from other than [facility] providers in the past year (date may be approximate). ? ?   ?Assessment:  ? This is a routine wellness examination for [name]. ? ?Hearing/Vision screen ?No results found. ? ?Dietary issues and exercise activities discussed: ?  ? ? Goals Addressed   ? ?  ?  ?  ?  ?  ? This Visit's Progress  ?   Weight (lb) < 200 lb (90.7 kg) (pt-stated)     ?   He would like to lose some weight.  ?  ? ?  ? ?Depression Screen ? ?  07/29/2021  ? 12:30 PM 11/15/2020  ?  3:48 PM 10/06/2020  ?  1:34 PM 07/30/2020  ?  9:17 AM 07/06/2020  ?  1:40 PM 06/22/2020  ?  9:09 AM  ?PHQ 2/9 Scores  ?PHQ - 2 Score 0 0 0 0 0 0  ?  ?Fall Risk ? ?  11/15/2020  ?  3:48 PM 10/06/2020  ?  1:34 PM 07/30/2020  ?  9:17 AM 07/06/2020  ?  1:40 PM 06/22/2020  ?  9:09 AM  ?Fall Risk   ?Falls in the past year? 0 0 0 0 1  ?Number falls in past yr:     1  ?Injury with Fall?     0  ? ? ?FALL RISK PREVENTION PERTAINING TO THE HOME: ? ?Any stairs in or around the home? No  ?If so, are there any without handrails?  N/A ?Home free of loose throw rugs in walkways, pet beds, electrical cords, etc? Yes  ?Adequate lighting in your home to reduce risk of falls? Yes  ? ?ASSISTIVE DEVICES UTILIZED TO PREVENT FALLS: ? ?Life alert? No  ?Use of a cane, walker or w/c? No  ?Grab bars in the bathroom? No  ?Shower chair or bench in shower? No  ?Elevated toilet seat or a handicapped toilet? No  ? ?TIMED UP AND GO: ? ?Was the test performed? No .  ? ? ?Cognitive Function: ?  ?  ?  ? ?Immunizations ?Immunization History  ?Administered Date(s) Administered  ? PFIZER(Purple Top)SARS-COV-2 Vaccination  07/25/2019, 08/15/2019, 03/12/2020  ? Tdap 06/22/2020  ? ? ?TDAP status: Up to date ? ?Flu Vaccine status: Declined, Education has been provided regarding the importance of this vaccine but patient still declined. Advised may receive this vaccine at local pharmacy or Health Dept. Aware to provide a copy of the vaccination record if obtained from local pharmacy or Health Dept. Verbalized acceptance and understanding. ? ?Pneumococcal vaccine status: Declined,  Education has been provided regarding the importance of this vaccine but patient still declined. Advised may receive this vaccine at local pharmacy or Health Dept. Aware to provide a copy of the vaccination record if obtained from local pharmacy or Health Dept. Verbalized acceptance and understanding.  ? ?Covid-19 vaccine status: Declined, Education has been provided regarding the importance of this vaccine but patient still declined. Advised may receive this vaccine at local pharmacy or Health Dept.or vaccine clinic. Aware to provide a copy of the vaccination record if obtained from local pharmacy or Health Dept. Verbalized acceptance and understanding. ? ?Qualifies for Shingles Vaccine? Yes   ?Zostavax completed No   ?Shingrix Completed?: No.    Education has been provided regarding the importance of this vaccine. Patient has been advised to call insurance company to determine out of pocket expense if they have not yet received this vaccine. Advised may also receive vaccine at local pharmacy or Health Dept. Verbalized acceptance and understanding. ? ?Screening Tests ?Health Maintenance  ?Topic Date Due  ? URINE MICROALBUMIN  Never done  ? Hepatitis C Screening  Never done  ? COLONOSCOPY (Pts 45-4yrs Insurance coverage will need to be confirmed)  Never done  ? Zoster Vaccines- Shingrix (1 of 2) Never done  ? Pneumonia Vaccine 40+ Years old (1 - PCV) Never done  ? COVID-19 Vaccine (4 - Booster for Pfizer series) 05/07/2020  ? INFLUENZA VACCINE  Never done  ?  TETANUS/TDAP  06/22/2030  ? HPV VACCINES  Aged Out  ? ? ?Health Maintenance ? ?Health Maintenance Due  ?Topic Date Due  ? URINE MICROALBUMIN  Never done  ? Hepatitis C Screening  Never done  ? COLONOSCOPY (Pts 45-36yrs Alene Mires

## 2021-08-25 NOTE — Progress Notes (Signed)
? ?   ?[address] ?      [place] [address] ?            [phone]       ? ?Gavin Dennis ?Gavin Dennis ?Date of Birth: 1950/12/14 ? ?Referring: Gavin Jewel, MD ?Primary Care: Gavin Jewel, MD ?Primary Cardiologist:None ? ?Chief Complaint:    ?Chief Complaint  ?Patient presents with  ? Thoracic Aortic Aneurysm  ?  Surgical consult, Chest CT  07/01/21  ? ? ?History of Present Illness:     ?71 year old male referred for surgical evaluation of a 4.5 cm ascending this was found incidentally on lung cancer screening CT.  He does have a history of a cerebrovascular event, with residual visual field defects.  He also complains of mild sharp left-sided chest wall pain.  He denies any shortness of breath. ? ? ? ? ?Past Medical History:  ?Diagnosis Date  ? Blurred vision, left eye   ? since stroke 06/2020  ? CVA (cerebral vascular accident) ([place]) 06/2020  ? DDD (degenerative disc disease), lumbar   ? Hyperlipidemia   ? Hypertension   ? Hypomagnesemia   ? Polio   ? childhood  ? Vitamin B 12 deficiency   ? ? ?No past surgical history on file. ? ? ?Social History  ? ?Tobacco Use  ?Smoking Status Former  ? Packs/day: 1.00  ? Years: 30.00  ? Pack years: 30.00  ? Types: Cigarettes  ? Quit date: 05/02/2019  ? Years since quitting: 2.3  ?Smokeless Tobacco Never  ?Tobacco Comments  ? quit a mo ago  ?  ?Social History  ? ?Substance and Sexual Activity  ?Alcohol Use Not Currently  ? Comment: quit a mo ago  ? ? ? ?No Known Allergies ? ? ?Current Outpatient Medications  ?Medication Sig Dispense Refill  ? aspirin EC 81 MG EC tablet Take 1 tablet (81 mg total) by mouth daily. Swallow whole. 30 tablet 0  ? hydrochlorothiazide (HYDRODIURIL) 25 MG tablet Take 1 tablet (25 mg total) by mouth daily. 90 tablet 3  ? metFORMIN (GLUCOPHAGE) 500 MG tablet Take 500 mg by mouth daily.    ? vitamin B-12 (CYANOCOBALAMIN) 500 MCG tablet Take 500 mcg by mouth daily.    ? amLODipine (NORVASC) 10 MG tablet  Take 1 tablet (10 mg total) by mouth daily. 90 tablet 0  ? atorvastatin (LIPITOR) 20 MG tablet Take 1 tablet (20 mg total) by mouth daily. 90 tablet 1  ? ?No current facility-administered medications for this visit.  ? ? ?(Not in a hospital admission) ? ? ?Family History  ?Problem Relation Age of Onset  ? Heart Problems Mother   ? Prostate cancer Father   ? Lung cancer Brother   ? ? ? ?Review of Systems:  ? ?Review of Systems  ?Constitutional:  Negative for fever and malaise/fatigue.  ?Respiratory:  Negative for shortness of breath.   ?Cardiovascular:  Positive for chest pain.  ?Musculoskeletal:  Positive for myalgias.  ?  ? ?Physical Exam: ?BP (!) 160/110   Pulse 90   Resp 20   Ht 5\' 11"  (1.803 m)   Wt 200 lb (90.7 kg)   SpO2 95% Comment: RA  BMI 27.89 kg/m?  ?Physical Exam ?Constitutional:   ?   Appearance: Normal appearance. He is normal weight.  ?HENT:  ?   Head: Normocephalic and atraumatic.  ?Eyes:  ?   Extraocular Movements: Extraocular movements intact.  ?Cardiovascular:  ?  Rate and Rhythm: Normal rate.  ?Pulmonary:  ?   Effort: Pulmonary effort is normal. No respiratory distress.  ?Abdominal:  ?   General: Abdomen is flat.  ?Musculoskeletal:     ?   General: Normal range of motion.  ?   Cervical back: Normal range of motion.  ?Skin: ?   General: Skin is warm and dry.  ?Neurological:  ?   Mental Status: He is alert.  ?  ? ? ?Diagnostic Studies & Laboratory data: ?   ? ?Echo: ?March 2022 ?IMPRESSIONS  ? ? ? 1. Left ventricular ejection fraction, by estimation, is 55 to 60%. The  ?left ventricle has normal function. The left ventricle has no regional  ?wall motion abnormalities. There is mild left ventricular hypertrophy.  ?Left ventricular diastolic parameters  ?are consistent with Grade I diastolic dysfunction (impaired relaxation).  ? 2. Right ventricular systolic function is normal. The right ventricular  ?size is normal. There is normal pulmonary artery systolic pressure. The  ?estimated right  ventricular systolic pressure is 20.9 mmHg.  ? 3. The mitral valve is normal in structure. Trivial mitral valve  ?regurgitation. No evidence of mitral stenosis.  ? 4. The aortic valve is tricuspid. There is mild calcification of the  ?aortic valve. Aortic valve regurgitation is not visualized. No aortic  ?stenosis is present.  ? 5. Aortic dilatation noted. There is mild dilatation of the ascending  ?aorta, measuring 40 mm.  ? 6. The inferior vena cava is normal in size with greater than 50%  ?respiratory variability, suggesting right atrial pressure of 3 mmHg.  ?CT chest: ?Cardiovascular: Normal heart size. No significant pericardial ?effusion/thickening. Three-vessel coronary atherosclerosis. ?Atherosclerotic thoracic aorta with 4.5 cm ascending thoracic aortic ?aneurysm. Normal caliber pulmonary arteries. ? ? ?I have independently reviewed the above radiologic studies and discussed with the patient  ? ?Recent Lab Findings: ?Lab Results  ?Component Value Date  ? WBC 6.1 07/09/2020  ? HGB 16.0 07/09/2020  ? HCT 47.0 07/09/2020  ? PLT 215 07/09/2020  ? GLUCOSE 98 07/09/2020  ? CHOL 135 07/10/2020  ? TRIG 106 07/10/2020  ? HDL 39 (L) 07/10/2020  ? Gavin Dennis 76 07/10/2020  ? ALT 83 (H) 07/09/2020  ? AST 37 07/09/2020  ? NA 138 07/09/2020  ? K 3.8 07/09/2020  ? CL 105 07/09/2020  ? CREATININE 1.15 07/09/2020  ? BUN 23 07/09/2020  ? CO2 27 07/09/2020  ? TSH 1.812 07/10/2020  ? INR 1.0 07/09/2020  ? HGBA1C 6.3 (H) 07/10/2020  ? ? ? ? ?Assessment / Plan:   ?71 year old male with a 4.5 cm ascending aortic aneurysm that was noted on lung cancer screening CT.  He also has a 1 cm partially solid left upper lobe nodule.  In regards to his a sending aortic aneurysm his echocardiogram from 2022 shows that he has a tricuspid valve.  He does not meet size criteria for surgical replacement.  We discussed the importance of smoking cessation, blood pressure control, and cholesterol control.  He was instructed to refrain from any heavy  lifting.  We also covered the signs and symptoms of an aortic dissection.  His blood pressure is markedly elevated and he does not check this at home he is on several medications for this this referred him to our hypertension clinic for thorough evaluation.  He will return to clinic in 1 year with another CT chest.  We will continue surveillance for the lung cancer screening for the pulmonary nodule. ? ? ? ? ?I  spent 40 minutes counseling the patient face to face. ? ? ?Gavin Dennis ?08/26/2021 4:02 PM ? ? ? ? ? ? ?

## 2021-08-26 ENCOUNTER — Institutional Professional Consult (permissible substitution) (INDEPENDENT_AMBULATORY_CARE_PROVIDER_SITE_OTHER): Payer: Medicare HMO | Admitting: Thoracic Surgery (Cardiothoracic Vascular Surgery)

## 2021-08-26 VITALS — BP 160/110 | HR 90 | Resp 20 | Ht 71.0 in | Wt 200.0 lb

## 2021-08-26 DIAGNOSIS — E1169 Type 2 diabetes mellitus with other specified complication: Secondary | ICD-10-CM | POA: Insufficient documentation

## 2021-08-26 DIAGNOSIS — I10 Essential (primary) hypertension: Secondary | ICD-10-CM | POA: Insufficient documentation

## 2021-08-26 DIAGNOSIS — M5136 Other intervertebral disc degeneration, lumbar region: Secondary | ICD-10-CM | POA: Insufficient documentation

## 2021-08-26 DIAGNOSIS — I7121 Aneurysm of the ascending aorta, without rupture: Secondary | ICD-10-CM

## 2021-08-26 DIAGNOSIS — E785 Hyperlipidemia, unspecified: Secondary | ICD-10-CM | POA: Insufficient documentation

## 2021-08-26 DIAGNOSIS — I700 Atherosclerosis of aorta: Secondary | ICD-10-CM | POA: Insufficient documentation

## 2021-08-26 DIAGNOSIS — J439 Emphysema, unspecified: Secondary | ICD-10-CM | POA: Insufficient documentation

## 2021-08-26 DIAGNOSIS — I2510 Atherosclerotic heart disease of native coronary artery without angina pectoris: Secondary | ICD-10-CM | POA: Insufficient documentation

## 2021-08-26 DIAGNOSIS — M5090 Cervical disc disorder, unspecified, unspecified cervical region: Secondary | ICD-10-CM | POA: Insufficient documentation

## 2021-08-29 DIAGNOSIS — I7121 Aneurysm of the ascending aorta, without rupture: Secondary | ICD-10-CM | POA: Diagnosis not present

## 2021-08-29 DIAGNOSIS — E1169 Type 2 diabetes mellitus with other specified complication: Secondary | ICD-10-CM | POA: Diagnosis not present

## 2021-08-29 DIAGNOSIS — J439 Emphysema, unspecified: Secondary | ICD-10-CM | POA: Diagnosis not present

## 2021-08-29 DIAGNOSIS — I10 Essential (primary) hypertension: Secondary | ICD-10-CM | POA: Diagnosis not present

## 2021-08-30 ENCOUNTER — Ambulatory Visit (INDEPENDENT_AMBULATORY_CARE_PROVIDER_SITE_OTHER): Payer: Medicare HMO | Admitting: Internal Medicine

## 2021-08-30 ENCOUNTER — Encounter: Payer: Self-pay | Admitting: Internal Medicine

## 2021-08-30 ENCOUNTER — Telehealth: Payer: Self-pay | Admitting: Licensed Clinical Social Worker

## 2021-08-30 VITALS — BP 138/90 | HR 88 | Ht 71.0 in | Wt 216.6 lb

## 2021-08-30 DIAGNOSIS — I6300 Cerebral infarction due to thrombosis of unspecified precerebral artery: Secondary | ICD-10-CM

## 2021-08-30 DIAGNOSIS — I10 Essential (primary) hypertension: Secondary | ICD-10-CM | POA: Diagnosis not present

## 2021-08-30 MED ORDER — ATORVASTATIN CALCIUM 40 MG PO TABS: 40.0000 mg | ORAL_TABLET | Freq: Every day | ORAL | 3 refills | Status: DC

## 2021-08-30 MED ORDER — CARVEDILOL 6.25 MG PO TABS: 6.2500 mg | ORAL_TABLET | Freq: Two times a day (BID) | ORAL | 3 refills | Status: DC

## 2021-08-30 NOTE — Progress Notes (Signed)
?Cardiology Office Note:   ? ?Date:  08/30/2021  ? ?ID:  Gavin Dennis, DOB 1950-10-19, MRN [id number] ? ?PCP:  Mckinley Jewel, MD ?  ?[facility] Providers ?Cardiologist:  Janina Mayo, MD    ? ?Referring MD: Lajuana Matte, MD  ? ?No chief complaint on file. ?Thoracic Aneurysm ? ?History of Present Illness:   ? ?Gavin Dennis is a 71 y.o. male with a hx of CVA L eye blindness, HTN, 4.5 cm ascending aneurysm, former 30 pack year smoker, blood pressures poorly controlled, referral for aneurysm and HTN ? ?Patient was found to have 4.5 cm ascending thoracic aortic aneurysm and was seen by Dr. Kipp Brood. He has hx of stroke. His blood pressures have been poorly controlled. Noted his blood pressures have been in the past.  He's stopped smoking.  Hx of CVA last year. He takes aspirin.  No claudication ? ?He occasionally gets brief chest pain ("a twinge") and some SOB after having gained some weight. No consistent significant chest pressure or dyspnea on exertion.  No orthopnea , PND or LE edema.  ? ?Cardiology studies ?TTE- normal EF. RVSP 31. No valve dx. Ascending aorta 40 mm ? ?CT Chest 3/32023: Atherosclerotic thoracic aorta with 4.5 cm ascending thoracic aortic aneurysm. + CAC ? ?Past Medical History:  ?Diagnosis Date  ? Blurred vision, left eye   ? since stroke 06/2020  ? CVA (cerebral vascular accident) ([place]) 06/2020  ? DDD (degenerative disc disease), lumbar   ? Hyperlipidemia   ? Hypertension   ? Hypomagnesemia   ? Polio   ? childhood  ? Vitamin B 12 deficiency   ? ? ?No past surgical history on file. ? ?Current Medications: ?Current Meds  ?Medication Sig  ? amLODipine (NORVASC) 10 MG tablet Take 1 tablet (10 mg total) by mouth daily.  ? aspirin EC 81 MG EC tablet Take 1 tablet (81 mg total) by mouth daily. Swallow whole.  ? carvedilol (COREG) 6.25 MG tablet Take 1 tablet (6.25 mg total) by mouth 2 (two) times daily.  ? hydrochlorothiazide (HYDRODIURIL) 25 MG tablet Take 1 tablet (25 mg total) by mouth  daily.  ? metFORMIN (GLUCOPHAGE) 500 MG tablet Take 500 mg by mouth daily.  ? vitamin B-12 (CYANOCOBALAMIN) 500 MCG tablet Take 500 mcg by mouth daily.  ? [DISCONTINUED] atorvastatin (LIPITOR) 20 MG tablet Take 1 tablet (20 mg total) by mouth daily.  ?  ? ?Allergies:   Patient has no known allergies.  ? ?Social History  ? ?Socioeconomic History  ? Marital status: Single  ?  Spouse name: Not on file  ? Number of children: 4  ? Years of education: 6  ? Highest education level: 6th grade  ?Occupational History  ?  Comment: retired  ?Tobacco Use  ? Smoking status: Former  ?  Packs/day: 1.00  ?  Years: 30.00  ?  Pack years: 30.00  ?  Types: Cigarettes  ?  Quit date: 05/02/2019  ?  Years since quitting: 2.3  ? Smokeless tobacco: Never  ? Tobacco comments:  ?  quit a mo ago  ?Substance and Sexual Activity  ? Alcohol use: Not Currently  ?  Comment: quit a mo ago  ? Drug use: Not Currently  ?  Types: Cocaine  ?  Comment: hx of abuse  ? Sexual activity: Not on file  ?Other Topics Concern  ? Not on file  ?Social History Narrative  ? 02/02/21 lives alone  ? Very little caffeine  ? ?Social  Determinants of Health  ? ?Financial Resource Strain: High Risk  ? Difficulty of Paying Living Expenses: Hard  ?Food Insecurity: Food Insecurity Present  ? Worried About Charity fundraiser in the Last Year: Sometimes true  ? Ran Out of Food in the Last Year: Sometimes true  ?Transportation Needs: No Transportation Needs  ? Lack of Transportation (Medical): No  ? Lack of Transportation (Non-Medical): No  ?Physical Activity: Insufficiently Active  ? Days of Exercise per Week: 7 days  ? Minutes of Exercise per Session: 10 min  ?Stress: No Stress Concern Present  ? Feeling of Stress : Not at all  ?Social Connections: Moderately Integrated  ? Frequency of Communication with Friends and Family: More than three times a week  ? Frequency of Social Gatherings with Friends and Family: More than three times a week  ? Attends Religious Services: More than 4  times per year  ? Active Member of Clubs or Organizations: Yes  ? Attends Archivist Meetings: More than 4 times per year  ? Marital Status: Never married  ?  ? ?Family History: ?The patient's family history includes Heart Problems in his mother; Lung cancer in his brother; Prostate cancer in his father. ? ?ROS:   ?Please see the history of present illness.    ? All other systems reviewed and are negative. ? ?EKGs/Labs/Other Studies Reviewed:   ? ?The following studies were reviewed today: ? ? ?EKG:  EKG is  ordered today.  The ekg ordered today demonstrates ? ?08/30/2021-NSR, [facility] ? ?Recent Labs: ?No results found for requested labs within last 8760 hours.  ?Recent Lipid Panel ?   ?Component Value Date/Time  ? CHOL 135 07/10/2020 0314  ? TRIG 106 07/10/2020 0314  ? HDL 39 (L) 07/10/2020 0314  ? CHOLHDL 3.5 07/10/2020 0314  ? VLDL 21 07/10/2020 0314  ? [place] 75 07/10/2020 0314  ? ? ? ?Risk Assessment/Calculations:   ?  ? ?    ? ?Physical Exam:   ? ?VS:   ? ?Vitals:  ? 08/30/21 1326  ?BP: 138/90  ?Pulse: 88  ?SpO2: 97%  ? ? ? ?Wt Readings from Last 3 Encounters:  ?08/30/21 216 lb 9.6 oz (98.2 kg)  ?08/26/21 200 lb (90.7 kg)  ?02/02/21 200 lb 3.2 oz (90.8 kg)  ?  ? ?GEN:  Well nourished, well developed in no acute distress ?HEENT: Normal ?NECK: No JVD; No carotid bruits ?LYMPHATICS: No lymphadenopathy ?CARDIAC: RRR, no murmurs, rubs, gallops ?RESPIRATORY:  Clear to auscultation without rales, wheezing or rhonchi  ?ABDOMEN: Soft, non-tender, non-distended ?MUSCULOSKELETAL:  No edema; No deformity  ?SKIN: Warm and dry ?NEUROLOGIC:  Alert and oriented x 3 ?PSYCHIATRIC:  Normal affect  ? ?ASSESSMENT:   ? ?Aortic Aneurysm: Blood pressures not well controlled.  Recommended documenting blood pressures with a cuff for two weeks and bringing them to his pharmacy appointment. Surveillance per CT surgery.  ? ?HTN. Blood pressure goal is < 130/80 mmhg  ideally closer to 120s. Will add carvediolol. Continue norvasc 10 mg  daily and HCTz 25 mg daily.  Will mail blood pressure cuff to his home.  ? ?HLD: continue atorva 20 mg daily. LDL goal < 70 mg/dL. LDL near goal. CT with notable CAC. ? ?PLAN:   ? ?In order of problems listed above: ? ?Increase atorvastatin 40 mg daily ?Start carvedilol 6.25 mg BID ?Pharmacy visit in 3 months ?Follow up in 6 months ? ?   ?  ?Medication Adjustments/Labs and Tests Ordered: ?Current medicines  are reviewed at length with the patient today.  Concerns regarding medicines are outlined above.  ?Orders Placed This Encounter  ?Procedures  ? AMB Referral to [facility] Pharm-D  ? EKG 12-Lead  ? ?Meds ordered this encounter  ?Medications  ? carvedilol (COREG) 6.25 MG tablet  ?  Sig: Take 1 tablet (6.25 mg total) by mouth 2 (two) times daily.  ?  Dispense:  180 tablet  ?  Refill:  3  ? atorvastatin (LIPITOR) 40 MG tablet  ?  Sig: Take 1 tablet (40 mg total) by mouth daily.  ?  Dispense:  90 tablet  ?  Refill:  3  ? ? ?Patient Instructions  ?Medication Instructions:  ?START: CARVEDILOL 6.25mg  TWICE DAILY  ?START: ATORVASTATIN 40mg  ONCE DAILY  ?*If you need a refill on your cardiac medications before your next appointment, please call your pharmacy* ? ?Lab Work: ?None Ordered At This Time.  ?If you have labs (blood work) drawn today and your tests are completely normal, you will receive your results only by: ?MyChart Message (if you have MyChart) OR ?A paper copy in the mail ?If you have any lab test that is abnormal or we need to change your treatment, we will call you to review the results. ? ?Testing/Procedures: ?None Ordered At This Time.  ? ?Follow-Up: ?At [facility], you and your health needs are our priority.  As part of our continuing mission to provide you with exceptional heart care, we have created designated Provider Care Teams.  These Care Teams include your primary Cardiologist (physician) and Advanced Practice Providers (APPs -  Physician Assistants and Nurse Practitioners) who all work together  to provide you with the care you need, when you need it. ? ?We recommend signing up for the patient portal called "MyChart".  Sign up information is provided on this After Visit Summary.  MyChart is use

## 2021-08-30 NOTE — Telephone Encounter (Signed)
LCSW received referral for BP cuff. Was able to reach pt this afternoon at [phone], pt confirmed he is in need. Encouraged him to call his insurance company's member services line on the back of his card first to see if they will cover this cost. If not then we can assist as able w/ [name].  ? ?Gavin Dennis, MSW, LCSW ?Clinical Social Worker II ?[facility]  ?[phone]- work cell phone (preferred) ?[phone]- desk phone ? ?

## 2021-08-30 NOTE — Patient Instructions (Signed)
Medication Instructions:  ?START: CARVEDILOL 6.25mg  TWICE DAILY  ?START: ATORVASTATIN 40mg  ONCE DAILY  ?*If you need a refill on your cardiac medications before your next appointment, please call your pharmacy* ? ?Lab Work: ?None Ordered At This Time.  ?If you have labs (blood work) drawn today and your tests are completely normal, you will receive your results only by: ?MyChart Message (if you have MyChart) OR ?A paper copy in the mail ?If you have any lab test that is abnormal or we need to change your treatment, we will call you to review the results. ? ?Testing/Procedures: ?None Ordered At This Time.  ? ?Follow-Up: ?At [facility], you and your health needs are our priority.  As part of our continuing mission to provide you with exceptional heart care, we have created designated Provider Care Teams.  These Care Teams include your primary Cardiologist (physician) and Advanced Practice Providers (APPs -  Physician Assistants and Nurse Practitioners) who all work together to provide you with the care you need, when you need it. ? ?We recommend signing up for the patient portal called "MyChart".  Sign up information is provided on this After Visit Summary.  MyChart is used to connect with patients for Virtual Visits (Telemedicine).  Patients are able to view lab/test results, encounter notes, upcoming appointments, etc.  Non-urgent messages can be sent to your provider as well.   ?To learn more about what you can do with MyChart, go to [email or web address].   ? ?PLEASE SCHEDULE APPOINTMENT WITH PHARMD IN 3 MONTHS FOR FOLLOW UP ON BLOOD PRESSURE  ? ?Your next appointment:   ?6 month(s) ? ?The format for your next appointment:   ?In Person ? ?Provider:   ?Janina Mayo, MD   ? ? ? ? ? ? ?  ?

## 2021-08-31 NOTE — Addendum Note (Signed)
Addended by: Michelle Nasuti on: 08/31/2021 04:54 PM ? ? Modules accepted: Level of Service ? ?

## 2021-11-13 ENCOUNTER — Emergency Department (HOSPITAL_COMMUNITY)
Admission: EM | Admit: 2021-11-13 | Discharge: 2021-11-13 | Disposition: A | Discharge status: Home / Self Care | Payer: Medicare HMO | Attending: Emergency Medicine | Admitting: Emergency Medicine

## 2021-11-13 ENCOUNTER — Encounter (HOSPITAL_COMMUNITY): Payer: Self-pay

## 2021-11-13 ENCOUNTER — Emergency Department (HOSPITAL_COMMUNITY): Payer: Medicare HMO

## 2021-11-13 DIAGNOSIS — Z87891 Personal history of nicotine dependence: Secondary | ICD-10-CM | POA: Insufficient documentation

## 2021-11-13 DIAGNOSIS — M545 Low back pain, unspecified: Secondary | ICD-10-CM | POA: Insufficient documentation

## 2021-11-13 DIAGNOSIS — Z7982 Long term (current) use of aspirin: Secondary | ICD-10-CM | POA: Diagnosis not present

## 2021-11-13 DIAGNOSIS — I10 Essential (primary) hypertension: Secondary | ICD-10-CM | POA: Insufficient documentation

## 2021-11-13 DIAGNOSIS — M5459 Other low back pain: Secondary | ICD-10-CM | POA: Diagnosis not present

## 2021-11-13 DIAGNOSIS — M25552 Pain in left hip: Secondary | ICD-10-CM | POA: Diagnosis not present

## 2021-11-13 DIAGNOSIS — M549 Dorsalgia, unspecified: Secondary | ICD-10-CM | POA: Diagnosis not present

## 2021-11-13 MED ORDER — IBUPROFEN 600 MG PO TABS: 600.0000 mg | ORAL_TABLET | Freq: Four times a day (QID) | ORAL | 0 refills | Status: DC | PRN

## 2021-11-13 MED ORDER — TRAMADOL HCL 50 MG PO TABS
50.0000 mg | ORAL_TABLET | Freq: Once | ORAL | Status: AC
  Administered 2021-11-13: 50 mg via ORAL
  Filled 2021-11-13: qty 1

## 2021-11-13 MED ORDER — LIDOCAINE 5 % EX PTCH: 1.0000 | MEDICATED_PATCH | CUTANEOUS | 0 refills | Status: DC

## 2021-11-13 NOTE — Discharge Instructions (Addendum)
Imaging today of your hips was negative for any fracture or bony abnormality.  Pain is most likely secondary to be a flareup of your chronic back pain.  We will prescribe Lidoderm patches as well as ibuprofen to take.  Lidoderm patches are too expensive at the pharmacy please do not pick them up.  There is an over-the-counter version called Salonpas which individuals have found significant relief from.  They are much cheaper than the prescription kind if insurance does not cover them.  Take ibuprofen every 6 hours for the next 3 to 5 days and gauge response.  He has appointment with your primary care provider tomorrow for further evaluation of your symptoms if needed.  Please do not hesitate to return to the emergency department if the worrisome signs and symptoms we discussed become apparent.

## 2021-11-13 NOTE — ED Provider Triage Note (Signed)
Emergency Medicine Provider Triage Evaluation Note  Gavin Dennis , a 71 y.o. male  was evaluated in triage.  Pt complains of low back pain. Hx of similar pain x 15 years. Episode began 5 days ago in low back with migration to L hip. Pain making it hard to walk and is aggravated by moving LLE. No fevers, new/worsening bowel/bladder incontinence, genital numbness, trauma. Has appt with PCP on Monday.  Review of Systems  Positive: As above Negative: As above  Physical Exam  BP (!) 161/102 (BP Location: Right Arm)   Pulse 70   Temp 97.9 F (36.6 C)   Resp 16   SpO2 100%  Gen:   Awake, no distress   Resp:  Normal effort  MSK:   Moves extremities without difficulty  Other:  TTP to lumbosacral paraspinal muscles. No step offs, deformity, or crepitus. Sensation intact in BLE. Preserved muscle strength.  Medical Decision Making  Medically screening exam initiated at 4:39 AM.  Appropriate orders placed.  Emit Kuenzel was informed that the remainder of the evaluation will be completed by another provider, this initial triage assessment does not replace that evaluation, and the importance of remaining in the ED until their evaluation is complete.  Acute on chronic low back pain without red flags. Neurovascularly intact. Medications given for pain pending formal evaluation.   Antonietta Breach, PA-C 11/13/21 [phone]

## 2021-11-13 NOTE — ED Provider Notes (Signed)
[facility] EMERGENCY DEPARTMENT Provider Note   CSN: [id number] Arrival date & time: 11/13/21  0421     History  Chief Complaint  Patient presents with   Back Pain    Gavin Dennis is a 71 y.o. male.   Back Pain  71 year old male presents emergency department with complaints of left low back pain with radiation into the hip.  Patient states that symptoms began approximately 4 to 5 days ago.  He denies fall or traumas or MVC's.  He states that he has had chronic back pain radiating down both legs for the past 15 years.  This pain feels similar to other episodes but has lasted longer prompting his visit to the emergency department.  He states he has not been able because of how painful it has been.  Denies weakness or sensory deficits in affected extremities.  He was given tramadol in triage with significant improvement of symptoms.  States he has a primary care appointment tomorrow.  Denies fever, new or worsening bowel/bladder dysfunction, saddle anesthesia, weakness, sensory deficits in lower extremities, history of IV drug use.  Denies fever, chills, night sweats chest pain, shortness of breath, abdominal pain, nausea/vomiting/diarrhea, urinary symptoms, change in bowel habits.  Patient states that he had a 600 mg ibuprofen 2 days ago which completely resolved symptoms but has not had any since.  Home Medications Prior to Admission medications   Medication Sig Start Date End Date Taking? Authorizing Provider  ibuprofen (ADVIL) 600 MG tablet Take 1 tablet (600 mg total) by mouth every 6 (six) hours as needed. 11/13/21  Yes Dion Saucier A, PA  lidocaine (LIDODERM) 5 % Place 1 patch onto the skin daily. Remove & Discard patch within 12 hours or as directed by MD 11/13/21  Yes Dion Saucier A, PA  amLODipine (NORVASC) 10 MG tablet Take 1 tablet (10 mg total) by mouth daily. 11/15/20 08/30/21  Kerin Perna, NP  aspirin EC 81 MG EC tablet Take 1 tablet (81 mg total)  by mouth daily. Swallow whole. 07/11/20   Lattie Haw, MD  atorvastatin (LIPITOR) 40 MG tablet Take 1 tablet (40 mg total) by mouth daily. 08/30/21   Janina Mayo, MD  carvedilol (COREG) 6.25 MG tablet Take 1 tablet (6.25 mg total) by mouth 2 (two) times daily. 08/30/21   Janina Mayo, MD  hydrochlorothiazide (HYDRODIURIL) 25 MG tablet Take 1 tablet (25 mg total) by mouth daily. 10/06/20   Kerin Perna, NP  metFORMIN (GLUCOPHAGE) 500 MG tablet Take 500 mg by mouth daily.    [provider]  vitamin B-12 (CYANOCOBALAMIN) 500 MCG tablet Take 500 mcg by mouth daily.    [provider]      Allergies    Patient has no known allergies.    Review of Systems   Review of Systems  Musculoskeletal:  Positive for back pain.  All other systems reviewed and are negative.   Physical Exam Updated Vital Signs BP 128/82 (BP Location: Right Arm)   Pulse 60   Temp 97.7 F (36.5 C)   Resp 15   SpO2 97%  Physical Exam Vitals and nursing note reviewed.  Constitutional:      General: He is not in acute distress.    Appearance: He is well-developed.  HENT:     Head: Normocephalic and atraumatic.  Eyes:     Conjunctiva/sclera: Conjunctivae normal.  Cardiovascular:     Rate and Rhythm: Normal rate and regular rhythm.  Heart sounds: No murmur heard. Pulmonary:     Effort: Pulmonary effort is normal. No respiratory distress.     Breath sounds: Normal breath sounds.  Abdominal:     Palpations: Abdomen is soft.     Tenderness: There is no abdominal tenderness.  Musculoskeletal:        General: Tenderness present. No swelling.     Cervical back: Neck supple.     Right lower leg: No edema.     Left lower leg: No edema.     Comments: No midline tenderness of lumbar spine.  Tenderness to palpation of left lumbosacral paraspinal muscles as well as in the piriformis area on the left.  Straight leg raise elicits pain left side with radicular pain to left hip but not past left  knee..  Patient is tender to palpation of left lumbosacral/gluteal muscles.  Muscle strength 5 out of 5 bilaterally.  DTRs symmetric and intact.  Posterior tibial pulses full and intact bilaterally.  No overlying skin abnormality noted.  Skin:    General: Skin is warm and dry.     Capillary Refill: Capillary refill takes less than 2 seconds.  Neurological:     Mental Status: He is alert.  Psychiatric:        Mood and Affect: Mood normal.     ED Results / Procedures / Treatments   Labs (all labs ordered are listed, but only abnormal results are displayed) Labs Reviewed - No data to display  EKG None  Radiology DG Hip Unilat With Pelvis 2-3 Views Left  Result Date: 11/13/2021 CLINICAL DATA:  Worsening chronic left hip pain. EXAM: DG HIP (WITH OR WITHOUT PELVIS) 2-3V LEFT COMPARISON:  None Available. FINDINGS: There is no evidence of hip fracture or dislocation. There is no evidence of arthropathy or other focal bone abnormality. Extensive peripheral vascular calcification incidentally noted. IMPRESSION: No radiographic abnormality of left hip joint. Electronically Signed   By: Marlaine Hind M.D.   On: 11/13/2021 13:19    Procedures Procedures    Medications Ordered in ED Medications  traMADol (ULTRAM) tablet 50 mg (50 mg Oral Given 11/13/21 0445)    ED Course/ Medical Decision Making/ A&P                           Medical Decision Making  This patient presents to the ED for concern of back pain, this involves an extensive number of treatment options, and is a complaint that carries with it a high risk of complications and morbidity.  The differential diagnosis includes The emergent differential diagnosis for back pain includes but is not limited to fracture, muscle strain, cauda equina, spinal stenosis. DDD, ankylosing spondylitis, acute ligamentous injury, disk herniation, spondylolisthesis, Epidural compression syndrome, metastatic cancer, transverse myelitis, vertebral  osteomyelitis, diskitis, kidney stone, pyelonephritis, AAA, Perforated ulcer, Retrocecal appendicitis, pancreatitis, bowel obstruction, retroperitoneal hemorrhage or mass, meningitis.   Co morbidities that complicate the patient evaluation  CVA, degenerative disc disease of the lumbar area, hyperlipidemia, hypertension, essential hypertension, pulmonary emphysema   Additional history obtained:  Additional history obtained from son who is at bedside External records from outside source obtained and reviewed including MRI lumbar spine from 07/09/2020 indicating severe L4-L5 facet arthrosis with facet edema, grade 1 anterolisthesis, mild spinal stenosis and severe right neural foraminal stenosis.  Mild stenosis at L2-L3   Lab Tests:  N/a   Imaging Studies ordered:  I ordered imaging studies including DG pelvis to 3 view  I independently visualized and interpreted imaging which showed no acute abnormality I agree with the radiologist interpretation   Cardiac Monitoring: / EKG:  The patient was maintained on a cardiac monitor.  I personally viewed and interpreted the cardiac monitored which showed an underlying rhythm of: Sinus rhythm   Consultations Obtained:  N/a   Problem List / ED Course / Critical interventions / Medication management  Back pain I ordered medication including Ultram for pain  Reevaluation of the patient after these medicines showed that the patient improved I have reviewed the patients home medicines and have made adjustments as needed   Social Determinants of Health:  Former cigarette smoker stopped in 2021.  Denies alcohol or illicit drug use.   Test / Admission - Considered:  Back pain Vitals signs within normal range and stable throughout visit. Imaging studies significant for: No acute abnormality Patient symptoms is likely related to acute on chronic back pain.  Since patient responded so well to ibuprofen taken a couple days ago, new  prescription will be written for said medication.  Symptomatic therapy also continued with Lidoderm patches.  Doubt cauda equina.  Doubt spinal epidural abscess.  Doubt fracture.  Doubt abdominal pathology. Treatment plan was discussed with patient and son who is at bedside.  They knowledge understanding and were agreeable to said plan.  They agreed to keep appointment with primary care provider tomorrow if symptoms do not resolve with medications prescribed from emergency department. Worrisome signs and symptoms were discussed with the patient, and the patient acknowledged understanding to return to the ED if noticed. Patient was stable upon discharge.         Final Clinical Impression(s) / ED Diagnoses Final diagnoses:  Acute left-sided low back pain, unspecified whether sciatica present    Rx / DC Orders ED Discharge Orders          Ordered    ibuprofen (ADVIL) 600 MG tablet  Every 6 hours PRN        11/13/21 1401    lidocaine (LIDODERM) 5 %  Every 24 hours        11/13/21 1401              Wilnette Kales, [place] 11/13/21 1406    Pattricia Boss, MD 11/13/21 1536

## 2021-11-13 NOTE — ED Triage Notes (Signed)
Pt comes via [facility] for lower chronic back pain that shoots down his leg. This flare up shoots down both legs and has been going on for 5 days.

## 2021-11-14 DIAGNOSIS — I10 Essential (primary) hypertension: Secondary | ICD-10-CM | POA: Diagnosis not present

## 2021-11-14 DIAGNOSIS — M5136 Other intervertebral disc degeneration, lumbar region: Secondary | ICD-10-CM | POA: Diagnosis not present

## 2021-11-14 DIAGNOSIS — M25552 Pain in left hip: Secondary | ICD-10-CM | POA: Diagnosis not present

## 2021-11-14 DIAGNOSIS — E1169 Type 2 diabetes mellitus with other specified complication: Secondary | ICD-10-CM | POA: Diagnosis not present

## 2021-11-21 ENCOUNTER — Inpatient Hospital Stay: Admission: RE | Admit: 2021-11-21 | Payer: Medicare HMO | Source: Ambulatory Visit

## 2021-11-24 ENCOUNTER — Other Ambulatory Visit: Payer: Medicare HMO

## 2021-11-30 ENCOUNTER — Ambulatory Visit: Payer: Medicare HMO

## 2021-11-30 NOTE — Progress Notes (Deleted)
     11/30/2021 Romell Wolden [birth year] [id number]   HPI:  Gavin Dennis is a 71 y.o. male patient of Dr Harl Bowie, with a PMH below who presents today for hypertension clinic evaluation.  Past Medical History: CVA Some left eye blurriness snce  aneurysm Ascending aortic aneurysm  hyperlipidemia 9/22:  LDL 71 - now on atorvastatin 40  DM2   7/23 A1c 7.1 - on metformin        Blood Pressure Goal:  130/80  Current Medications: amlodipine 10 mg qd, carvedilol 6.25 mg bid, hctz 25 mg qd  Family Hx:  Social Hx:   Diet:   Exercise:   Home BP readings:   Intolerances: nkda  Labs: 7/23:  Na 137, K 3.8, Glu    Wt Readings from Last 3 Encounters:  08/30/21 216 lb 9.6 oz (98.2 kg)  08/26/21 200 lb (90.7 kg)  02/02/21 200 lb 3.2 oz (90.8 kg)   BP Readings from Last 3 Encounters:  11/13/21 128/82  08/30/21 138/90  08/26/21 (!) 160/110   Pulse Readings from Last 3 Encounters:  11/13/21 60  08/30/21 88  08/26/21 90    Current Outpatient Medications  Medication Sig Dispense Refill   amLODipine (NORVASC) 10 MG tablet Take 1 tablet (10 mg total) by mouth daily. 90 tablet 0   aspirin EC 81 MG EC tablet Take 1 tablet (81 mg total) by mouth daily. Swallow whole. 30 tablet 0   atorvastatin (LIPITOR) 40 MG tablet Take 1 tablet (40 mg total) by mouth daily. 90 tablet 3   carvedilol (COREG) 6.25 MG tablet Take 1 tablet (6.25 mg total) by mouth 2 (two) times daily. 180 tablet 3   hydrochlorothiazide (HYDRODIURIL) 25 MG tablet Take 1 tablet (25 mg total) by mouth daily. 90 tablet 3   ibuprofen (ADVIL) 600 MG tablet Take 1 tablet (600 mg total) by mouth every 6 (six) hours as needed. 30 tablet 0   lidocaine (LIDODERM) 5 % Place 1 patch onto the skin daily. Remove & Discard patch within 12 hours or as directed by MD 30 patch 0   metFORMIN (GLUCOPHAGE) 500 MG tablet Take 500 mg by mouth daily.     vitamin B-12 (CYANOCOBALAMIN) 500 MCG tablet Take 500 mcg by mouth daily.     No current  facility-administered medications for this visit.    No Known Allergies  Past Medical History:  Diagnosis Date   Blurred vision, left eye    since stroke 06/2020   CVA (cerebral vascular accident) ([place]) 06/2020   DDD (degenerative disc disease), lumbar    Hyperlipidemia    Hypertension    Hypomagnesemia    Polio    childhood   Vitamin B 12 deficiency     There were no vitals taken for this visit.  No problem-specific Assessment & Plan notes found for this encounter.   Tommy Medal PharmD CPP [facility] [address] [place], [place] [address] [phone]

## 2021-12-22 ENCOUNTER — Other Ambulatory Visit: Payer: Medicare HMO

## 2021-12-27 ENCOUNTER — Ambulatory Visit: Payer: Medicare HMO | Attending: Cardiovascular Disease

## 2021-12-27 NOTE — Progress Notes (Deleted)
      12/27/2021 Gavin Dennis [birth year] [id number]   HPI:  Gavin Dennis is a 71 y.o. male patient of Dr Harl Bowie, with a PMH below who presents today for hypertension clinic evaluation.  He was seen by Dr. Harl Bowie in May, at which time his pressure was elevated at 138/90.  Because of his aortic aneurysm, would like to see BP closer to 151 than 761 systolic.    Past Medical History: CAD   Aneurysm Ascending aorta  -   CVA 06/2020  hyperlipidemia 3/23 LDL 75 on atorvastatin 40 mg  DM2 3/23 A1c 6.3 on metformin 500 mg qd     Blood Pressure Goal:  130/80  Current Medications:   amlodipine 10 mg qd, carvedilol 6.25 mg bid, hctz 25 mg qd,   Family Hx:     Social Hx:      Diet:      Exercise:   Home BP readings:      Intolerances:    Labs: 3/23:  Na 138, K  3.8, Glu 98, BUN 23, SCr 1.15, GFR > 60   Wt Readings from Last 3 Encounters:  08/30/21 216 lb 9.6 oz (98.2 kg)  08/26/21 200 lb (90.7 kg)  02/02/21 200 lb 3.2 oz (90.8 kg)   BP Readings from Last 3 Encounters:  11/13/21 128/82  08/30/21 138/90  08/26/21 (!) 160/110   Pulse Readings from Last 3 Encounters:  11/13/21 60  08/30/21 88  08/26/21 90    Current Outpatient Medications  Medication Sig Dispense Refill   amLODipine (NORVASC) 10 MG tablet Take 1 tablet (10 mg total) by mouth daily. 90 tablet 0   aspirin EC 81 MG EC tablet Take 1 tablet (81 mg total) by mouth daily. Swallow whole. 30 tablet 0   atorvastatin (LIPITOR) 40 MG tablet Take 1 tablet (40 mg total) by mouth daily. 90 tablet 3   carvedilol (COREG) 6.25 MG tablet Take 1 tablet (6.25 mg total) by mouth 2 (two) times daily. 180 tablet 3   hydrochlorothiazide (HYDRODIURIL) 25 MG tablet Take 1 tablet (25 mg total) by mouth daily. 90 tablet 3   ibuprofen (ADVIL) 600 MG tablet Take 1 tablet (600 mg total) by mouth every 6 (six) hours as needed. 30 tablet 0   lidocaine (LIDODERM) 5 % Place 1 patch onto the skin daily. Remove & Discard patch within 12 hours  or as directed by MD 30 patch 0   metFORMIN (GLUCOPHAGE) 500 MG tablet Take 500 mg by mouth daily.     vitamin B-12 (CYANOCOBALAMIN) 500 MCG tablet Take 500 mcg by mouth daily.     No current facility-administered medications for this visit.    No Known Allergies  Past Medical History:  Diagnosis Date   Blurred vision, left eye    since stroke 06/2020   CVA (cerebral vascular accident) ([place]) 06/2020   DDD (degenerative disc disease), lumbar    Hyperlipidemia    Hypertension    Hypomagnesemia    Polio    childhood   Vitamin B 12 deficiency     There were no vitals taken for this visit.  No BP recorded.  {Refresh Note OR Click here to enter BP  :1}***    No problem-specific Assessment & Plan notes found for this encounter.   Tommy Medal PharmD CPP [facility] [address] [place], [place] [address] [phone]

## 2021-12-30 ENCOUNTER — Other Ambulatory Visit: Payer: Self-pay | Admitting: Internal Medicine

## 2021-12-30 DIAGNOSIS — M70861 Other soft tissue disorders related to use, overuse and pressure, right lower leg: Secondary | ICD-10-CM | POA: Diagnosis not present

## 2021-12-30 DIAGNOSIS — M7989 Other specified soft tissue disorders: Secondary | ICD-10-CM

## 2021-12-30 DIAGNOSIS — R0789 Other chest pain: Secondary | ICD-10-CM | POA: Diagnosis not present

## 2021-12-30 DIAGNOSIS — I10 Essential (primary) hypertension: Secondary | ICD-10-CM | POA: Diagnosis not present

## 2021-12-30 DIAGNOSIS — R609 Edema, unspecified: Secondary | ICD-10-CM

## 2021-12-30 DIAGNOSIS — M70862 Other soft tissue disorders related to use, overuse and pressure, left lower leg: Secondary | ICD-10-CM | POA: Diagnosis not present

## 2021-12-30 DIAGNOSIS — E7849 Other hyperlipidemia: Secondary | ICD-10-CM | POA: Diagnosis not present

## 2021-12-30 DIAGNOSIS — E1169 Type 2 diabetes mellitus with other specified complication: Secondary | ICD-10-CM | POA: Diagnosis not present

## 2022-01-06 ENCOUNTER — Ambulatory Visit
Admission: RE | Admit: 2022-01-06 | Discharge: 2022-01-06 | Disposition: A | Discharge status: Home / Self Care | Payer: Medicare HMO | Source: Ambulatory Visit | Attending: Internal Medicine | Admitting: Internal Medicine

## 2022-01-06 DIAGNOSIS — M7989 Other specified soft tissue disorders: Secondary | ICD-10-CM | POA: Diagnosis not present

## 2022-01-06 DIAGNOSIS — R609 Edema, unspecified: Secondary | ICD-10-CM

## 2022-01-10 ENCOUNTER — Telehealth: Payer: Self-pay

## 2022-01-10 NOTE — Telephone Encounter (Signed)
   Pre-operative Risk Assessment    Patient Name: Gavin Dennis  DOB: 07-24-1950 MRN: [id number]      Request for Surgical Clearance    Procedure:   Colonoscopy  Date of Surgery:  Clearance 01/27/22                                 Surgeon:  Dr. Ronnette Juniper Surgeon's Group or Practice Name:  [facility] Phone number:  [phone] Fax number:  [phone]   Type of Clearance Requested:   - Medical    Type of Anesthesia:   Propofol   Additional requests/questions:  Please advise surgeon/provider what medications should be held.  Signed, Elsie Lincoln Donnica Jarnagin   01/10/2022, 2:25 PM

## 2022-01-12 NOTE — Telephone Encounter (Signed)
   Name: Gavin Dennis  DOB: [birth year]  MRN: [id number]  Primary Cardiologist: Janina Mayo, MD  Chart reviewed as part of pre-operative protocol coverage. Because of [name]'s past medical history and time since last visit, he will require a follow-up in-office visit in order to better assess preoperative cardiovascular risk.  At last OV 08/2021 was started on new medication with recommendation to f/u in 3 months but do not see this occurred. Therefore in lieu of virtual visit makes sense to try and move his future visit up to both see for HTN follow-up and clear for procedure since medical clearance is requested.  Pre-op covering staff: - Please schedule appointment and call patient to inform them. If patient already had an upcoming appointment within acceptable timeframe, please add "pre-op clearance" to the appointment notes so provider is aware. - Please contact requesting surgeon's office via preferred method (i.e, phone, fax) to inform them of need for appointment prior to surgery.  GI asking what meds to hold on clearance. Do not typically hold ASA for colonoscopy.  [name], PA-C  01/12/2022, 12:59 PM

## 2022-01-12 NOTE — Telephone Encounter (Signed)
Spoke with the patient and scheduled him with Raquel Sarna on 01/19/22 at 8:50. I advised the patient to arrive by 8:45am. He voiced understanding.

## 2022-01-17 DIAGNOSIS — E1169 Type 2 diabetes mellitus with other specified complication: Secondary | ICD-10-CM | POA: Diagnosis not present

## 2022-01-17 DIAGNOSIS — I10 Essential (primary) hypertension: Secondary | ICD-10-CM | POA: Diagnosis not present

## 2022-01-17 DIAGNOSIS — K219 Gastro-esophageal reflux disease without esophagitis: Secondary | ICD-10-CM | POA: Diagnosis not present

## 2022-01-17 DIAGNOSIS — M7989 Other specified soft tissue disorders: Secondary | ICD-10-CM | POA: Diagnosis not present

## 2022-01-18 NOTE — Progress Notes (Deleted)
Office Visit    Patient Name: Gavin Dennis Date of Encounter: 01/18/2022  Primary Care Provider:  Mckinley Jewel, MD Primary Cardiologist:  Janina Mayo, MD  Chief Complaint    71 year old male with a history of ascending thoracic aortic aneurysm, coronary artery calcificaiton, hypertension, hyperlipidemia, CVA with residual left eye blindness, DDD, and former tobacco use who presents for follow-up related to hypertension and for preoperative cardiac evaluation.   Past Medical History    Past Medical History:  Diagnosis Date   Blurred vision, left eye    since stroke 06/2020   CVA (cerebral vascular accident) ([place]) 06/2020   DDD (degenerative disc disease), lumbar    Hyperlipidemia    Hypertension    Hypomagnesemia    Polio    childhood   Vitamin B 12 deficiency    No past surgical history on file.  Allergies  No Known Allergies  History of Present Illness    71 year old male with the above past medical history including ascending thoracic aortic aneurysm, coronary artery calcification, hypertension, hyperlipidemia, CVA with residual left eye blindness, DDD, and former tobacco use.  CT of the chest for lung cancer screening in 06/2021 revealed 4.5 cm ascending thoracic aortic aneurysm.  Only, he was noted to have a 1 cm partially solid left upper lobe pulmonary nodule.  He was evaluated by CT surgery.  Smoking cessation was advised, as well as BP and cholesterol control.  He was advised to refrain from any heavy lifting.  Follow-up was recommended in 1 year for repeat CT chest.  He has a history of CVA in 2022. He was referred to cardiology in May 2023 for evaluation of uncontrolled hypertension.  He was last seen in the office on 08/30/2021.  BP was elevated.  He was started on carvedilol.  Additionally, her statin was increased to 40 mg daily.Home BP monitoring was advised with plans to follow-up with pharmD for BP management in 3 months. He was evaluated in the ED in July  2023 for severe back pain.  Imaging was negative for acute process.'s were thought to be related to chronic back pain and he was advised to take ibuprofen.   He presents today for follow-up and for preoperative cardiac evaluation for colonoscopy scheduled for 01/27/2022 with Dr. Veva Holes of [facility].   Hypertension: Ascending thoracic aortic aneurysm: Coronary artery calcification: Hypertension: Hyperlipidemia: H/o CVA: Preoperative cardiac exam: Disposition:  Home Medications    Current Outpatient Medications  Medication Sig Dispense Refill   amLODipine (NORVASC) 10 MG tablet Take 1 tablet (10 mg total) by mouth daily. 90 tablet 0   aspirin EC 81 MG EC tablet Take 1 tablet (81 mg total) by mouth daily. Swallow whole. 30 tablet 0   atorvastatin (LIPITOR) 40 MG tablet Take 1 tablet (40 mg total) by mouth daily. 90 tablet 3   carvedilol (COREG) 6.25 MG tablet Take 1 tablet (6.25 mg total) by mouth 2 (two) times daily. 180 tablet 3   hydrochlorothiazide (HYDRODIURIL) 25 MG tablet Take 1 tablet (25 mg total) by mouth daily. 90 tablet 3   ibuprofen (ADVIL) 600 MG tablet Take 1 tablet (600 mg total) by mouth every 6 (six) hours as needed. 30 tablet 0   lidocaine (LIDODERM) 5 % Place 1 patch onto the skin daily. Remove & Discard patch within 12 hours or as directed by MD 30 patch 0   metFORMIN (GLUCOPHAGE) 500 MG tablet Take 500 mg by mouth daily.     vitamin  B-12 (CYANOCOBALAMIN) 500 MCG tablet Take 500 mcg by mouth daily.     No current facility-administered medications for this visit.     Review of Systems    ***.  All other systems reviewed and are otherwise negative except as noted above.    Physical Exam    VS:  There were no vitals taken for this visit. , BMI There is no height or weight on file to calculate BMI.     GEN: Well nourished, well developed, in no acute distress. HEENT: normal. Neck: Supple, no JVD, carotid bruits, or masses. Cardiac: RRR, no murmurs, rubs, or  gallops. No clubbing, cyanosis, edema.  Radials/DP/PT 2+ and equal bilaterally.  Respiratory:  Respirations regular and unlabored, clear to auscultation bilaterally. GI: Soft, nontender, nondistended, BS + x 4. MS: no deformity or atrophy. Skin: warm and dry, no rash. Neuro:  Strength and sensation are intact. Psych: Normal affect.  Accessory Clinical Findings    ECG personally reviewed by me today - *** - no acute changes.   Lab Results  Component Value Date   WBC 6.1 07/09/2020   HGB 16.0 07/09/2020   HCT 47.0 07/09/2020   MCV 91.4 07/09/2020   PLT 215 07/09/2020   Lab Results  Component Value Date   CREATININE 1.15 07/09/2020   BUN 23 07/09/2020   NA 138 07/09/2020   K 3.8 07/09/2020   CL 105 07/09/2020   CO2 27 07/09/2020   Lab Results  Component Value Date   ALT 83 (H) 07/09/2020   AST 37 07/09/2020   ALKPHOS 109 07/09/2020   BILITOT 0.9 07/09/2020   Lab Results  Component Value Date   CHOL 135 07/10/2020   HDL 39 (L) 07/10/2020   LDLCALC 75 07/10/2020   TRIG 106 07/10/2020   CHOLHDL 3.5 07/10/2020    Lab Results  Component Value Date   HGBA1C 6.3 (H) 07/10/2020    Assessment & Plan    1.  ***  No BP recorded.  {Refresh Note OR Click here to enter BP  :1}***   Lenna Sciara, NP 01/18/2022, 9:57 AM

## 2022-01-19 ENCOUNTER — Ambulatory Visit: Payer: Medicare HMO | Admitting: Nurse Practitioner

## 2022-01-19 ENCOUNTER — Ambulatory Visit: Payer: Medicare HMO | Attending: Nurse Practitioner | Admitting: General Practice

## 2022-01-19 ENCOUNTER — Telehealth: Payer: Self-pay | Admitting: General Practice

## 2022-01-19 ENCOUNTER — Encounter: Payer: Self-pay | Admitting: General Practice

## 2022-01-19 VITALS — BP 116/86 | HR 91 | Ht 71.0 in | Wt 212.6 lb

## 2022-01-19 DIAGNOSIS — I7121 Aneurysm of the ascending aorta, without rupture: Secondary | ICD-10-CM | POA: Diagnosis not present

## 2022-01-19 DIAGNOSIS — R600 Localized edema: Secondary | ICD-10-CM

## 2022-01-19 DIAGNOSIS — E782 Mixed hyperlipidemia: Secondary | ICD-10-CM | POA: Diagnosis not present

## 2022-01-19 DIAGNOSIS — I10 Essential (primary) hypertension: Secondary | ICD-10-CM | POA: Diagnosis not present

## 2022-01-19 DIAGNOSIS — Z01810 Encounter for preprocedural cardiovascular examination: Secondary | ICD-10-CM | POA: Diagnosis not present

## 2022-01-19 NOTE — Patient Instructions (Addendum)
Medication Instructions:  The current medical regimen is effective;  continue present plan and medications as directed. Please refer to the Current Medication list given to you today.  *If you need a refill on your cardiac medications before your next appointment, please call your pharmacy*  Lab Work: DIR LDL AND LFT-TODAY  If you have labs (blood work) drawn today and your tests are completely normal, you will receive your results only by:  [place] (if you have MyChart) OR A paper copy in the mail If you have any lab test that is abnormal or we need to change your treatment, we will call you to review the results.  Follow-Up: At [facility], you and your health needs are our priority.  As part of our continuing mission to provide you with exceptional heart care, we have created designated Provider Care Teams.  These Care Teams include your primary Cardiologist (physician) and Advanced Practice Providers (APPs -  Physician Assistants and Nurse Practitioners) who all work together to provide you with the care you need, when you need it.  We recommend signing up for the patient portal called "MyChart".  Sign up information is provided on this After Visit Summary.  MyChart is used to connect with patients for Virtual Visits (Telemedicine).  Patients are able to view lab/test results, encounter notes, upcoming appointments, etc.  Non-urgent messages can be sent to your provider as well.   To learn more about what you can do with MyChart, go to [email or web address].    Your next appointment:   DISCUSS APPOINTMENT AT CHECK OUT   The format for your next appointment:   In Person  Provider:   Janina Mayo, MD    Other Instructions PLEASE READ AND FOLLOW ATTACHED SALTY 6  PLEASE [place] DIET TAKE AND LOG YOUR WEIGHT DAILY  PLEASE PURCHASE AND WEAR COMPRESSION STOCKINGS DAILY AND TAKE OFF AT BEDTIME. Compression stockings are elastic socks  that squeeze the legs. They help to increase blood flow to the legs and to decrease swelling in the legs from fluid retention, and reduce the chance of developing blood clots in the lower legs. Please put on in the AM when dressing and off at night when dressing for bed.  LET THEM KNOW THAT YOU NEED KNEE HIGH'S WITH COMPRESSION OF 15-20 mmhg.  [employer]  [employer] ([place] [phone]); [place], [place] [address]; [phone]  EMAIL   [email or web address].  PLEASE MAKE SURE TO ELEVATE YOUR FEET & LEGS WHILE SITTING, THIS WILL HELP WITH THE SWELLING ALSO.   Important Information About Sugar        High-Fiber Eating Plan Fiber, also called dietary fiber, is a type of carbohydrate. It is found foods such as fruits, vegetables, whole grains, and beans. A high-fiber diet can have many health benefits. Your health care provider may recommend a high-fiber diet to help: Prevent constipation. Fiber can make your bowel movements more regular. Lower your cholesterol. Relieve the following conditions: Inflammation of veins in the anus (hemorrhoids). Inflammation of specific areas of the digestive tract (uncomplicated diverticulosis). A problem of the large intestine, also called the colon, that sometimes causes pain and diarrhea (irritable bowel syndrome, or IBS). Prevent overeating as part of a weight-loss plan. Prevent heart disease, type 2 diabetes, and certain cancers. What are tips for following this plan? Reading food labels  Check the nutrition facts label on food products for the amount of dietary fiber. Choose foods that  have 5 grams of fiber or more per serving. The goals for recommended daily fiber intake include: Men (age 62 or younger): 34-38 g. Men (over age 54): 28-34 g. Women (age 23 or younger): 25-28 g. Women (over age 107): 22-25 g. Your daily fiber goal is _____________ g. Shopping Choose whole fruits and vegetables instead of processed forms, such as  apple juice or applesauce. Choose a wide variety of high-fiber foods such as avocados, lentils, oats, and kidney beans. Read the nutrition facts label of the foods you choose. Be aware of foods with added fiber. These foods often have high sugar and sodium amounts per serving. Cooking Use whole-grain flour for baking and cooking. Cook with brown rice instead of white rice. Meal planning Start the day with a breakfast that is high in fiber, such as a cereal that contains 5 g of fiber or more per serving. Eat breads and cereals that are made with whole-grain flour instead of refined flour or white flour. Eat brown rice, bulgur wheat, or millet instead of white rice. Use beans in place of meat in soups, salads, and pasta dishes. Be sure that half of the grains you eat each day are whole grains. General information You can get the recommended daily intake of dietary fiber by: Eating a variety of fruits, vegetables, grains, nuts, and beans. Taking a fiber supplement if you are not able to take in enough fiber in your diet. It is better to get fiber through food than from a supplement. Gradually increase how much fiber you consume. If you increase your intake of dietary fiber too quickly, you may have bloating, cramping, or gas. Drink plenty of water to help you digest fiber. Choose high-fiber snacks, such as berries, raw vegetables, nuts, and popcorn. What foods should I eat? Fruits Berries. Pears. Apples. Oranges. Avocado. Prunes and raisins. Dried figs. Vegetables Sweet potatoes. Spinach. Kale. Artichokes. Cabbage. Broccoli. Cauliflower. Green peas. Carrots. Squash. Grains Whole-grain breads. Multigrain cereal. Oats and oatmeal. Brown rice. Barley. Bulgur wheat. [place]. Quinoa. Bran muffins. Popcorn. Rye wafer crackers. Meats and other proteins Navy beans, kidney beans, and pinto beans. Soybeans. Split peas. Lentils. Nuts and seeds. Dairy Fiber-fortified yogurt. Beverages Fiber-fortified  soy milk. Fiber-fortified orange juice. Other foods Fiber bars. The items listed above may not be a complete list of recommended foods and beverages. Contact a dietitian for more information. What foods should I avoid? Fruits Fruit juice. Cooked, strained fruit. Vegetables Fried potatoes. Canned vegetables. Well-cooked vegetables. Grains White bread. Pasta made with refined flour. White rice. Meats and other proteins Fatty cuts of meat. Fried chicken or fried fish. Dairy Milk. Yogurt. Cream cheese. Sour cream. Fats and oils Butters. Beverages Soft drinks. Other foods Cakes and pastries. The items listed above may not be a complete list of foods and beverages to avoid. Talk with your dietitian about what choices are best for you. Summary Fiber is a type of carbohydrate. It is found in foods such as fruits, vegetables, whole grains, and beans. A high-fiber diet has many benefits. It can help to prevent constipation, lower blood cholesterol, aid weight loss, and reduce your risk of heart disease, diabetes, and certain cancers. Increase your intake of fiber gradually. Increasing fiber too quickly may cause cramping, bloating, and gas. Drink plenty of water while you increase the amount of fiber you consume. The best sources of fiber include whole fruits and vegetables, whole grains, nuts, seeds, and beans. This information is not intended to replace advice given to you by  your health care provider. Make sure you discuss any questions you have with your health care provider. Document Revised: 08/21/2019 Document Reviewed: 08/21/2019 Elsevier Patient Education  [place].

## 2022-01-19 NOTE — Telephone Encounter (Signed)
Rescheduled the patient to see Coletta Memos at 11:20 AM this morning.

## 2022-01-19 NOTE — Progress Notes (Signed)
Cardiology Clinic Note   Patient Name: Gavin Dennis Date of Encounter: 01/19/2022  Primary Care Provider:  Mckinley Jewel, MD Primary Cardiologist:  Janina Mayo, MD  Patient Profile    Gavin Dennis 71-year-old male presents to the clinic today for follow-up evaluation of his essential hypertension and preoperative cardiac evaluation.  Past Medical History    Past Medical History:  Diagnosis Date   Blurred vision, left eye    since stroke 06/2020   CVA (cerebral vascular accident) ([place]) 06/2020   DDD (degenerative disc disease), lumbar    Hyperlipidemia    Hypertension    Hypomagnesemia    Polio    childhood   Vitamin B 12 deficiency    No past surgical history on file.  Allergies  No Known Allergies  History of Present Illness    Gavin Dennis has a PMH of CVA, a sending aortic aneurysm 5.4 cm, CAC, essential hypertension, pulmonary edema, type 2 diabetes, former tobacco use, and HLD.  His aneurysm was observed by Dr. Kipp Brood.  His CVA was due to poorly controlled blood pressure (2022).  His echocardiogram showed normal EF no valvular disease and a sending aortic aneurysm measuring 40 mm.  Chest CT 3/23 showed atherosclerotic aorta measuring 4.5 cm and coronary artery calcification.  He was seen by Dr. Phineas Inches on 08/30/2021.  During that time he reported compliance with aspirin, he denied claudication.  He did note occasional brief episodes of chest pain which she describes as twinges.  He also did note some shortness of breath after having gained some weight.  He denied chest pressure and dyspnea on exertion.  He denied orthopnea PND and lower extremity swelling.  He presents to the clinic today for follow-up evaluation and preoperative cardiac evaluation.  He states he feels well.  He presents for evaluation for colonoscopy.  His blood pressure has been much better controlled at home.  He is limited in his physical activity due to his left knee and bilateral ankle  pain.  He presents with a crutch.  He does note some lower extremity swelling.  His swelling is better with waking up in the morning.  He has been seen by his PCP who prescribed diuresis.  He has not yet started taking it.  We reviewed the importance of low-salt diet, daily weights, elevating lower extremities and lower extremity support stockings.  I will give him the [place] support stocking sheet, salty 6 diet sheet, have him elevate his lower extremities when not active, continue to monitor his blood pressure, check a direct LDL and LFTs and plan follow-up as scheduled with Dr. Phineas Inches.  Today he denies chest pain, shortness of breath, increased lower extremity edema, fatigue, palpitations, melena, hematuria, hemoptysis, diaphoresis, weakness, presyncope, syncope, orthopnea, and PND.   Home Medications    Prior to Admission medications   Medication Sig Start Date End Date Taking? Authorizing Provider  amLODipine (NORVASC) 10 MG tablet Take 1 tablet (10 mg total) by mouth daily. 11/15/20 08/30/21  Kerin Perna, NP  aspirin EC 81 MG EC tablet Take 1 tablet (81 mg total) by mouth daily. Swallow whole. 07/11/20   Lattie Haw, MD  atorvastatin (LIPITOR) 40 MG tablet Take 1 tablet (40 mg total) by mouth daily. 08/30/21   Janina Mayo, MD  carvedilol (COREG) 6.25 MG tablet Take 1 tablet (6.25 mg total) by mouth 2 (two) times daily. 08/30/21   Janina Mayo, MD  hydrochlorothiazide (HYDRODIURIL) 25 MG tablet Take  1 tablet (25 mg total) by mouth daily. 10/06/20   Kerin Perna, NP  ibuprofen (ADVIL) 600 MG tablet Take 1 tablet (600 mg total) by mouth every 6 (six) hours as needed. 11/13/21   Dion Saucier A, PA  lidocaine (LIDODERM) 5 % Place 1 patch onto the skin daily. Remove & Discard patch within 12 hours or as directed by MD 11/13/21   Wilnette Kales, PA  metFORMIN (GLUCOPHAGE) 500 MG tablet Take 500 mg by mouth daily.    [provider]  vitamin B-12 (CYANOCOBALAMIN) 500  MCG tablet Take 500 mcg by mouth daily.    [provider]    Family History    Family History  Problem Relation Age of Onset   Heart Problems Mother    Prostate cancer Father    Lung cancer Brother    He indicated that his mother is deceased. He indicated that his father is deceased. He indicated that his sister is deceased. He indicated that his brother is deceased. He indicated that his maternal grandmother is deceased. He indicated that his maternal grandfather is deceased. He indicated that his paternal grandmother is deceased. He indicated that his paternal grandfather is deceased.  Social History    Social History   Socioeconomic History   Marital status: Single    Spouse name: Not on file   Number of children: 4   Years of education: 6   Highest education level: 6th grade  Occupational History    Comment: retired  Tobacco Use   Smoking status: Former    Packs/day: 1.00    Years: 30.00    Total pack years: 30.00    Types: Cigarettes    Quit date: 05/02/2019    Years since quitting: 2.7   Smokeless tobacco: Never   Tobacco comments:    quit a mo ago  Substance and Sexual Activity   Alcohol use: Not Currently    Comment: quit a mo ago   Drug use: Not Currently    Types: Cocaine    Comment: hx of abuse   Sexual activity: Not on file  Other Topics Concern   Not on file  Social History Narrative   02/02/21 lives alone   Very little caffeine   Social Determinants of Health   Financial Resource Strain: High Risk (07/29/2021)   Overall Financial Resource Strain (CARDIA)    Difficulty of Paying Living Expenses: Hard  Food Insecurity: Food Insecurity Present (07/29/2021)   Hunger Vital Sign    Worried About [place] in the Last Year: Sometimes true    Ran Out of Food in the Last Year: Sometimes true  Transportation Needs: No Transportation Needs (07/29/2021)   PRAPARE - Hydrologist (Medical): No    Lack of  Transportation (Non-Medical): No  Physical Activity: Insufficiently Active (07/29/2021)   Exercise Vital Sign    Days of Exercise per Week: 7 days    Minutes of Exercise per Session: 10 min  Stress: No Stress Concern Present (07/29/2021)   [place]    Feeling of Stress : Not at all  Social Connections: Moderately Integrated (07/29/2021)   Social Connection and Isolation Panel [NHANES]    Frequency of Communication with Friends and Family: More than three times a week    Frequency of Social Gatherings with Friends and Family: More than three times a week    Attends Religious Services: More than 4 times  per year    Active Member of Clubs or Organizations: Yes    Attends Archivist Meetings: More than 4 times per year    Marital Status: Never married  Intimate Partner Violence: Not on file     Review of Systems    General:  No chills, fever, night sweats or weight changes.  Cardiovascular:  No chest pain, dyspnea on exertion, edema, orthopnea, palpitations, paroxysmal nocturnal dyspnea. Dermatological: No rash, lesions/masses Respiratory: No cough, dyspnea Urologic: No hematuria, dysuria Abdominal:   No nausea, vomiting, diarrhea, bright red blood per rectum, melena, or hematemesis Neurologic:  No visual changes, wkns, changes in mental status. All other systems reviewed and are otherwise negative except as noted above.  Physical Exam    VS:  BP 116/86   Pulse 91   Ht 5\' 11"  (1.803 m)   Wt 212 lb 9.6 oz (96.4 kg)   SpO2 98%   BMI 29.65 kg/m  , BMI Body mass index is 29.65 kg/m. GEN: Well nourished, well developed, in no acute distress. HEENT: normal. Neck: Supple, no JVD, carotid bruits, or masses. Cardiac: RRR, no murmurs, rubs, or gallops. No clubbing, cyanosis, generalized bilateral lower extremity edema.  Radials/DP/PT 2+ and equal bilaterally.  Respiratory:  Respirations regular and unlabored,  clear to auscultation bilaterally. GI: Soft, nontender, nondistended, BS + x 4. MS: no deformity or atrophy. Skin: warm and dry, no rash. Neuro:  Strength and sensation are intact. Psych: Normal affect.  Accessory Clinical Findings    Recent Labs: No results found for requested labs within last 365 days.   Recent Lipid Panel    Component Value Date/Time   CHOL 135 07/10/2020 0314   TRIG 106 07/10/2020 0314   HDL 39 (L) 07/10/2020 0314   CHOLHDL 3.5 07/10/2020 0314   VLDL 21 07/10/2020 0314   LDLCALC 75 07/10/2020 0314         ECG personally reviewed by me today-normal sinus rhythm left axis deviation moderate voltage criteria for LVH 91 bpm no ectopy.  Echocardiogram 07/09/2020  IMPRESSIONS     1. Left ventricular ejection fraction, by estimation, is 55 to 60%. The  left ventricle has normal function. The left ventricle has no regional  wall motion abnormalities. There is mild left ventricular hypertrophy.  Left ventricular diastolic parameters  are consistent with Grade I diastolic dysfunction (impaired relaxation).   2. Right ventricular systolic function is normal. The right ventricular  size is normal. There is normal pulmonary artery systolic pressure. The  estimated right ventricular systolic pressure is 54.2 mmHg.   3. The mitral valve is normal in structure. Trivial mitral valve  regurgitation. No evidence of mitral stenosis.   4. The aortic valve is tricuspid. There is mild calcification of the  aortic valve. Aortic valve regurgitation is not visualized. No aortic  stenosis is present.   5. Aortic dilatation noted. There is mild dilatation of the ascending  aorta, measuring 40 mm.   6. The inferior vena cava is normal in size with greater than 50%  respiratory variability, suggesting right atrial pressure of 3 mmHg.   Conclusion(s)/Recommendation(s): No intracardiac source of embolism  detected on this transthoracic study. A transesophageal echocardiogram  is  recommended to exclude cardiac source of embolism if clinically indicated.  Assessment & Plan   1.  Essential hypertension-BP today 116/86.  Well-controlled at home. Continue carvedilol, Norvasc, HCTZ Continue blood pressure log Heart healthy low-sodium diet-salty 6 given Increase physical activity as tolerated   Hyperlipidemia-LDL 71  on 01/13/2021 Continue atorvastatin Heart healthy low-sodium carb diet.   Increase physical activity as tolerated Direct LDL and LFTs  Aortic aneurysm-no chest or back pain.  Noted to have a 4.5 cm a sending thoracic aortic aneurysm seen on CT.  3/23  CAC-noted on CT.  Denies chest pain. Continue atorvastatin Heart healthy low-sodium diet Increase physical activity as tolerated  Lower extremity edema-bilateral lower extremity generalized nonpitting edema.  Has been prescribed diuresis by PCP.  Has not yet started taking. [place] support stocking sheet given Lower extremities with active Heart healthy low-sodium diet Follows with PCP  Preoperative cardiac evaluation-colonoscopy   Chart reviewed as part of pre-operative protocol coverage. Given past medical history and time since last visit, based on ACC/AHA guidelines, Lynx Goodrich would be at acceptable risk for the planned procedure without further cardiovascular testing.   Patient was advised that if he develops new symptoms prior to surgery to contact our office to arrange a follow-up appointment.  He verbalized understanding.    His RCRI is a class III risk, 6.6% risk of major cardiac event.  Disposition: Follow-up with Dr. Harl Bowie as scheduled.   Jossie Ng. Roni Friberg NP-C     01/19/2022, 12:07 PM [place] [address] Office [phone] Fax [phone]  Notice: This dictation was prepared with Dragon dictation along with smaller phrase technology. Any transcriptional errors that result from this process are unintentional and may not be  corrected upon review.  I spent 14 minutes examining this patient, reviewing medications, and using patient centered shared decision making involving her cardiac care.  Prior to her visit I spent greater than 20 minutes reviewing her past medical history,  medications, and prior cardiac tests.

## 2022-01-20 ENCOUNTER — Telehealth: Payer: Self-pay | Admitting: General Practice

## 2022-01-20 LAB — HEPATIC FUNCTION PANEL
ALT: 19 IU/L (ref 0–44)
AST: 17 IU/L (ref 0–40)
Albumin: 4.1 g/dL (ref 3.9–4.9)
Alkaline Phosphatase: 145 IU/L — ABNORMAL HIGH (ref 44–121)
Bilirubin Total: 1.2 mg/dL (ref 0.0–1.2)
Bilirubin, Direct: 0.41 mg/dL — ABNORMAL HIGH (ref 0.00–0.40)
Total Protein: 7.7 g/dL (ref 6.0–8.5)

## 2022-01-20 LAB — LDL CHOLESTEROL, DIRECT: LDL Direct: 29 mg/dL (ref 0–99)

## 2022-01-20 NOTE — Telephone Encounter (Signed)
Patient made aware of results and verbalized understanding.    Deberah Pelton, NP  01/20/2022  7:05 AM EDT     Mr. Micco was noted to have elevated alkaline phosphatase levels.  Please ask him to increase his omega-3 fatty acid levels (consuming more nuts, Chia seeds, flax seeds, Brussels sprouts, may take over-the-counter fish oil) he may also do resistance type exercises and increase his vitamin D level/sun exposure.  We have not yet received his direct LDL.  Please forward these results to his Pcp for further review and management.  Thank you.

## 2022-01-20 NOTE — Telephone Encounter (Signed)
See result note.  

## 2022-01-20 NOTE — Telephone Encounter (Signed)
Patient returning call for lab results. 

## 2022-01-24 ENCOUNTER — Inpatient Hospital Stay: Admission: RE | Admit: 2022-01-24 | Payer: Medicare HMO | Source: Ambulatory Visit

## 2022-01-27 DIAGNOSIS — K648 Other hemorrhoids: Secondary | ICD-10-CM | POA: Diagnosis not present

## 2022-01-27 DIAGNOSIS — D123 Benign neoplasm of transverse colon: Secondary | ICD-10-CM | POA: Diagnosis not present

## 2022-01-27 DIAGNOSIS — K5730 Diverticulosis of large intestine without perforation or abscess without bleeding: Secondary | ICD-10-CM | POA: Diagnosis not present

## 2022-01-27 DIAGNOSIS — Z800 Family history of malignant neoplasm of digestive organs: Secondary | ICD-10-CM | POA: Diagnosis not present

## 2022-01-27 DIAGNOSIS — Z1211 Encounter for screening for malignant neoplasm of colon: Secondary | ICD-10-CM | POA: Diagnosis not present

## 2022-01-27 DIAGNOSIS — D122 Benign neoplasm of ascending colon: Secondary | ICD-10-CM | POA: Diagnosis not present

## 2022-01-31 DIAGNOSIS — D123 Benign neoplasm of transverse colon: Secondary | ICD-10-CM | POA: Diagnosis not present

## 2022-02-03 DIAGNOSIS — R600 Localized edema: Secondary | ICD-10-CM | POA: Diagnosis not present

## 2022-03-02 ENCOUNTER — Ambulatory Visit: Payer: Medicare HMO | Admitting: Internal Medicine

## 2022-03-03 DIAGNOSIS — I2510 Atherosclerotic heart disease of native coronary artery without angina pectoris: Secondary | ICD-10-CM | POA: Diagnosis not present

## 2022-03-03 DIAGNOSIS — E1169 Type 2 diabetes mellitus with other specified complication: Secondary | ICD-10-CM | POA: Diagnosis not present

## 2022-03-03 DIAGNOSIS — I7121 Aneurysm of the ascending aorta, without rupture: Secondary | ICD-10-CM | POA: Diagnosis not present

## 2022-03-03 DIAGNOSIS — R079 Chest pain, unspecified: Secondary | ICD-10-CM | POA: Diagnosis not present

## 2022-03-06 ENCOUNTER — Ambulatory Visit: Payer: Medicare HMO | Attending: Internal Medicine | Admitting: Internal Medicine

## 2022-03-06 VITALS — BP 156/72 | HR 81 | Ht 71.0 in | Wt 212.8 lb

## 2022-03-06 DIAGNOSIS — I7121 Aneurysm of the ascending aorta, without rupture: Secondary | ICD-10-CM | POA: Diagnosis not present

## 2022-03-06 MED ORDER — CARVEDILOL 12.5 MG PO TABS: 12.5000 mg | ORAL_TABLET | Freq: Two times a day (BID) | ORAL | 5 refills | Status: DC

## 2022-03-06 MED ORDER — CARVEDILOL 6.25 MG PO TABS: 6.2500 mg | ORAL_TABLET | Freq: Two times a day (BID) | ORAL | 5 refills | Status: DC

## 2022-03-06 NOTE — Patient Instructions (Addendum)
Medication Instructions:  START: CARVEDILOL TO 6.25mg  TWICE DAILY  *If you need a refill on your cardiac medications before your next appointment, please call your pharmacy*  Lab Work: BMET- TODAY If you have labs (blood work) drawn today and your tests are completely normal, you will receive your results only by: [place] (if you have MyChart) OR A paper copy in the mail If you have any lab test that is abnormal or we need to change your treatment, we will call you to review the results.  Testing/Procedures: CTA CHEST/AORTA- SOMEONE WILL REACH OUT TO YOU TO SCHEDULE THIS   Follow-Up: At [facility], you and your health needs are our priority.  As part of our continuing mission to provide you with exceptional heart care, we have created designated Provider Care Teams.  These Care Teams include your primary Cardiologist (physician) and Advanced Practice Providers (APPs -  Physician Assistants and Nurse Practitioners) who all work together to provide you with the care you need, when you need it.  Your next appointment:   6 month(s)  The format for your next appointment:   In Person  Provider:   Janina Mayo, MD     Other Instructions Please obtain a blood pressure cuff ([employer]) at [place] or [facility]. Please check blood pressures for one week. If they are on average > 130/80 mmHg then let us know

## 2022-03-06 NOTE — Progress Notes (Signed)
Cardiology Office Note:    Date:  03/06/2022   ID:  [name], DOB 09-22-1950, MRN [id number]  PCP:  Mckinley Jewel, MD   [facility] Providers Cardiologist:  Janina Mayo, MD     Referring MD: Mckinley Jewel, MD   No chief complaint on file. Thoracic Aneurysm  History of Present Illness:    Gavin Dennis is a 71 y.o. male with a hx of CVA L eye blindness, HTN, 4.5 cm ascending aneurysm, former 30 pack year smoker, blood pressures poorly controlled, referral for aneurysm and HTN  Patient was found to have 4.5 cm ascending thoracic aortic aneurysm and was seen by Dr. Kipp Brood. He has hx of stroke. His blood pressures have been poorly controlled. Noted his blood pressures have been in the past.  He's stopped smoking.  Hx of CVA last year. He takes aspirin.  No claudication  He occasionally gets brief chest pain ("a twinge") and some SOB after having gained some weight. No consistent significant chest pressure or dyspnea on exertion.  No orthopnea , PND or LE edema.   Interim Hx 11/6 No recent hospitalizations. Not very active.He notes at times he is getting CP with activity. It was sharp pain, in the center. It lasted a few seconds. He says his home BP machine is not great. Notes rechecked Bps with PCP are in a better range.   Cardiology studies TTE- normal EF. RVSP 31. No valve dx. Ascending aorta 40 mm  CT Chest 3/32023: Atherosclerotic thoracic aorta with 4.5 cm ascending thoracic aortic aneurysm. + CAC  Past Medical History:  Diagnosis Date   Blurred vision, left eye    since stroke 06/2020   CVA (cerebral vascular accident) ([place]) 06/2020   DDD (degenerative disc disease), lumbar    Hyperlipidemia    Hypertension    Hypomagnesemia    Polio    childhood   Vitamin B 12 deficiency     No past surgical history on file.  Current Medications: Current Meds  Medication Sig   amLODipine (NORVASC) 10 MG tablet Take 1 tablet (10 mg total) by mouth daily.   aspirin  EC 81 MG EC tablet Take 1 tablet (81 mg total) by mouth daily. Swallow whole.   atorvastatin (LIPITOR) 40 MG tablet Take 1 tablet (40 mg total) by mouth daily.   carvedilol (COREG) 6.25 MG tablet Take 1 tablet (6.25 mg total) by mouth 2 (two) times daily.   hydrochlorothiazide (HYDRODIURIL) 25 MG tablet Take 1 tablet (25 mg total) by mouth daily.   ibuprofen (ADVIL) 600 MG tablet Take 1 tablet (600 mg total) by mouth every 6 (six) hours as needed.   metFORMIN (GLUCOPHAGE) 500 MG tablet Take 500 mg by mouth daily.   vitamin B-12 (CYANOCOBALAMIN) 500 MCG tablet Take 500 mcg by mouth daily.     Allergies:   Patient has no known allergies.   Social History   Socioeconomic History   Marital status: Single    Spouse name: Not on file   Number of children: 4   Years of education: 6   Highest education level: 6th grade  Occupational History    Comment: retired  Tobacco Use   Smoking status: Former    Packs/day: 1.00    Years: 30.00    Total pack years: 30.00    Types: Cigarettes    Quit date: 05/02/2019    Years since quitting: 2.8   Smokeless tobacco: Never   Tobacco comments:    quit a  mo ago  Substance and Sexual Activity   Alcohol use: Not Currently    Comment: quit a mo ago   Drug use: Not Currently    Types: Cocaine    Comment: hx of abuse   Sexual activity: Not on file  Other Topics Concern   Not on file  Social History Narrative   02/02/21 lives alone   Very little caffeine   Social Determinants of Health   Financial Resource Strain: High Risk (07/29/2021)   Overall Financial Resource Strain (CARDIA)    Difficulty of Paying Living Expenses: Hard  Food Insecurity: Food Insecurity Present (07/29/2021)   Hunger Vital Sign    Worried About Running Out of Food in the Last Year: Sometimes true    Ran Out of Food in the Last Year: Sometimes true  Transportation Needs: No Transportation Needs (07/29/2021)   PRAPARE - Hydrologist (Medical): No     Lack of Transportation (Non-Medical): No  Physical Activity: Insufficiently Active (07/29/2021)   Exercise Vital Sign    Days of Exercise per Week: 7 days    Minutes of Exercise per Session: 10 min  Stress: No Stress Concern Present (07/29/2021)   [employer]    Feeling of Stress : Not at all  Social Connections: Moderately Integrated (07/29/2021)   Social Connection and Isolation Panel [NHANES]    Frequency of Communication with Friends and Family: More than three times a week    Frequency of Social Gatherings with Friends and Family: More than three times a week    Attends Religious Services: More than 4 times per year    Active Member of [employer] or Organizations: Yes    Attends Music therapist: More than 4 times per year    Marital Status: Never married     Family History: The patient's family history includes Heart Problems in his mother; Lung cancer in his brother; Prostate cancer in his father.  ROS:   Please see the history of present illness.     All other systems reviewed and are negative.  EKGs/Labs/Other Studies Reviewed:    The following studies were reviewed today:   EKG:  EKG is  ordered today.  The ekg ordered today demonstrates  08/30/2021-NSR, LVH  Recent Labs: 01/19/2022: ALT 19  Recent Lipid Panel    Component Value Date/Time   CHOL 135 07/10/2020 0314   TRIG 106 07/10/2020 0314   HDL 39 (L) 07/10/2020 0314   CHOLHDL 3.5 07/10/2020 0314   VLDL 21 07/10/2020 0314   LDLCALC 75 07/10/2020 0314   LDLDIRECT 29 01/19/2022 1220     Risk Assessment/Calculations:           Physical Exam:    VS:    Vitals:   03/06/22 1421  BP: (!) 156/72  Pulse: 81  SpO2: 94%     Wt Readings from Last 3 Encounters:  03/06/22 212 lb 12.8 oz (96.5 kg)  01/19/22 212 lb 9.6 oz (96.4 kg)  08/30/21 216 lb 9.6 oz (98.2 kg)     GEN:  Well nourished, well developed in no acute  distress HEENT: Normal NECK: No JVD; No carotid bruits LYMPHATICS: No lymphadenopathy CARDIAC: RRR, no murmurs, rubs, gallops RESPIRATORY:  Clear to auscultation without rales, wheezing or rhonchi  ABDOMEN: Soft, non-tender, non-distended MUSCULOSKELETAL:  No edema; No deformity  SKIN: Warm and dry NEUROLOGIC:  Alert and oriented x 3 PSYCHIATRIC:  Normal affect  ASSESSMENT:    Aortic Aneurysm: 4.5 cm a sending thoracic aortic aneurysm seen on CT.  3/23; Re-ordering CTA for semiannual screening  HTN. Blood pressure goal is < 130/80 mmhg  ideally closer to 120s. Added carvedilol prior. Continue norvasc 10 mg daily and HCTz 25 mg daily.  Gave recs for a new BP machine  HLD: continue atorva 40 mg daily. LDL goal < 70 mg/dL. LDL  at goal  PLAN:    In order of problems listed above:   CTA chest surveillance Follow up in 6 months       Medication Adjustments/Labs and Tests Ordered: Current medicines are reviewed at length with the patient today.  Concerns regarding medicines are outlined above.  No orders of the defined types were placed in this encounter.  No orders of the defined types were placed in this encounter.   There are no Patient Instructions on file for this visit.   Signed, Janina Mayo, MD  03/06/2022 2:30 PM    [place]

## 2022-03-08 LAB — BASIC METABOLIC PANEL
BUN/Creatinine Ratio: 23 (ref 10–24)
BUN: 29 mg/dL — ABNORMAL HIGH (ref 8–27)
CO2: 21 mmol/L (ref 20–29)
Calcium: 10.9 mg/dL — ABNORMAL HIGH (ref 8.6–10.2)
Chloride: 102 mmol/L (ref 96–106)
Creatinine, Ser: 1.27 mg/dL (ref 0.76–1.27)
Glucose: 140 mg/dL — ABNORMAL HIGH (ref 70–99)
Potassium: 3.6 mmol/L (ref 3.5–5.2)
Sodium: 140 mmol/L (ref 134–144)
eGFR: 61 mL/min/{1.73_m2} (ref >59)

## 2022-03-12 ENCOUNTER — Other Ambulatory Visit: Payer: Self-pay

## 2022-03-12 ENCOUNTER — Inpatient Hospital Stay (HOSPITAL_COMMUNITY)
Admission: EM | Admit: 2022-03-12 | Discharge: 2022-03-15 | DRG: 042 | Disposition: A | Discharge status: Home Health | Payer: Medicare HMO | Attending: Internal Medicine | Admitting: Internal Medicine

## 2022-03-12 ENCOUNTER — Emergency Department (HOSPITAL_COMMUNITY): Payer: Medicare HMO

## 2022-03-12 ENCOUNTER — Encounter (HOSPITAL_COMMUNITY): Payer: Self-pay

## 2022-03-12 DIAGNOSIS — I639 Cerebral infarction, unspecified: Secondary | ICD-10-CM | POA: Diagnosis present

## 2022-03-12 DIAGNOSIS — I712 Thoracic aortic aneurysm, without rupture, unspecified: Secondary | ICD-10-CM | POA: Diagnosis present

## 2022-03-12 DIAGNOSIS — Z79899 Other long term (current) drug therapy: Secondary | ICD-10-CM | POA: Diagnosis not present

## 2022-03-12 DIAGNOSIS — R410 Disorientation, unspecified: Secondary | ICD-10-CM | POA: Diagnosis present

## 2022-03-12 DIAGNOSIS — R590 Localized enlarged lymph nodes: Secondary | ICD-10-CM | POA: Diagnosis present

## 2022-03-12 DIAGNOSIS — Z7982 Long term (current) use of aspirin: Secondary | ICD-10-CM | POA: Diagnosis not present

## 2022-03-12 DIAGNOSIS — E876 Hypokalemia: Secondary | ICD-10-CM | POA: Diagnosis present

## 2022-03-12 DIAGNOSIS — Z8616 Personal history of COVID-19: Secondary | ICD-10-CM | POA: Diagnosis not present

## 2022-03-12 DIAGNOSIS — Z8042 Family history of malignant neoplasm of prostate: Secondary | ICD-10-CM | POA: Diagnosis not present

## 2022-03-12 DIAGNOSIS — R270 Ataxia, unspecified: Secondary | ICD-10-CM | POA: Diagnosis present

## 2022-03-12 DIAGNOSIS — I6349 Cerebral infarction due to embolism of other cerebral artery: Secondary | ICD-10-CM | POA: Diagnosis present

## 2022-03-12 DIAGNOSIS — Z801 Family history of malignant neoplasm of trachea, bronchus and lung: Secondary | ICD-10-CM

## 2022-03-12 DIAGNOSIS — E538 Deficiency of other specified B group vitamins: Secondary | ICD-10-CM | POA: Diagnosis present

## 2022-03-12 DIAGNOSIS — I7121 Aneurysm of the ascending aorta, without rupture: Secondary | ICD-10-CM | POA: Diagnosis present

## 2022-03-12 DIAGNOSIS — R918 Other nonspecific abnormal finding of lung field: Secondary | ICD-10-CM | POA: Diagnosis not present

## 2022-03-12 DIAGNOSIS — I6389 Other cerebral infarction: Secondary | ICD-10-CM | POA: Diagnosis present

## 2022-03-12 DIAGNOSIS — Z7984 Long term (current) use of oral hypoglycemic drugs: Secondary | ICD-10-CM | POA: Diagnosis not present

## 2022-03-12 DIAGNOSIS — Z8673 Personal history of transient ischemic attack (TIA), and cerebral infarction without residual deficits: Secondary | ICD-10-CM | POA: Diagnosis not present

## 2022-03-12 DIAGNOSIS — E119 Type 2 diabetes mellitus without complications: Secondary | ICD-10-CM | POA: Diagnosis present

## 2022-03-12 DIAGNOSIS — Z87891 Personal history of nicotine dependence: Secondary | ICD-10-CM | POA: Diagnosis not present

## 2022-03-12 DIAGNOSIS — R29701 NIHSS score 1: Secondary | ICD-10-CM | POA: Diagnosis present

## 2022-03-12 DIAGNOSIS — I10 Essential (primary) hypertension: Secondary | ICD-10-CM | POA: Diagnosis present

## 2022-03-12 DIAGNOSIS — E785 Hyperlipidemia, unspecified: Secondary | ICD-10-CM | POA: Diagnosis present

## 2022-03-12 DIAGNOSIS — G8324 Monoplegia of upper limb affecting left nondominant side: Secondary | ICD-10-CM | POA: Diagnosis present

## 2022-03-12 DIAGNOSIS — R29818 Other symptoms and signs involving the nervous system: Secondary | ICD-10-CM | POA: Diagnosis not present

## 2022-03-12 DIAGNOSIS — I63411 Cerebral infarction due to embolism of right middle cerebral artery: Secondary | ICD-10-CM

## 2022-03-12 LAB — CBC WITH DIFFERENTIAL/PLATELET
Abs Immature Granulocytes: 0.02 10*3/uL (ref 0.00–0.07)
Basophils Absolute: 0 10*3/uL (ref 0.0–0.1)
Basophils Relative: 1 %
Eosinophils Absolute: 0.3 10*3/uL (ref 0.0–0.5)
Eosinophils Relative: 5 %
HCT: 42.8 % (ref 39.0–52.0)
Hemoglobin: 14.3 g/dL (ref 13.0–17.0)
Immature Granulocytes: 0 %
Lymphocytes Relative: 32 %
Lymphs Abs: 1.6 10*3/uL (ref 0.7–4.0)
MCH: 29.4 pg (ref 26.0–34.0)
MCHC: 33.4 g/dL (ref 30.0–36.0)
MCV: 88.1 fL (ref 80.0–100.0)
Monocytes Absolute: 0.6 10*3/uL (ref 0.1–1.0)
Monocytes Relative: 12 %
Neutro Abs: 2.5 10*3/uL (ref 1.7–7.7)
Neutrophils Relative %: 50 %
Platelets: 268 10*3/uL (ref 150–400)
RBC: 4.86 MIL/uL (ref 4.22–5.81)
RDW: 14.8 % (ref 11.5–15.5)
WBC: 5.1 10*3/uL (ref 4.0–10.5)
nRBC: 0 % (ref 0.0–0.2)

## 2022-03-12 LAB — BASIC METABOLIC PANEL
Anion gap: 10 (ref 5–15)
BUN: 24 mg/dL — ABNORMAL HIGH (ref 8–23)
CO2: 28 mmol/L (ref 22–32)
Calcium: 10.5 mg/dL — ABNORMAL HIGH (ref 8.9–10.3)
Chloride: 101 mmol/L (ref 98–111)
Creatinine, Ser: 1.45 mg/dL — ABNORMAL HIGH (ref 0.61–1.24)
GFR, Estimated: 52 mL/min — ABNORMAL LOW (ref >60)
Glucose, Bld: 143 mg/dL — ABNORMAL HIGH (ref 70–99)
Potassium: 3 mmol/L — ABNORMAL LOW (ref 3.5–5.1)
Sodium: 139 mmol/L (ref 135–145)

## 2022-03-12 NOTE — ED Triage Notes (Signed)
Pt arrived POV from home stating starting at 5am this morning he was having coordination issues. Pt states things that are simple all of a sudden became complicated. Pt was having issue with operating a fork while trying to eat, issues with trying to put a lid back on a jug. Pt denies any pain or weakness or any other neuro symptoms.

## 2022-03-12 NOTE — ED Provider Triage Note (Signed)
Emergency Medicine Provider Triage Evaluation Note  Gavin Dennis , a 71 y.o. male  was evaluated in triage.  Pt complains of some confusion.  States he woke up this morning to make his coffee and he cannot remember how to put the lid on a container.  He states "everything that is simple is harder."  He denies any slurred speech, vision changes, dizziness, headache, numbness, unilateral weakness. Hx of prior CVA  Review of Systems  Positive: Loss of coordination  Negative: HA, numbness, weakness, vision changes  Physical Exam  There were no vitals taken for this visit. Gen:   Awake, no distress   Resp:  Normal effort  MSK:   Moves extremities without difficulty  Neuro:  CN 2-12 grossly intact, equal hand grip, no drift, ambulatory Other:    Medical Decision Making  Medically screening exam initiated at 2:22 PM.  Appropriate orders placed.  Chazz Philson was informed that the remainder of the evaluation will be completed by another provider, this initial triage assessment does not replace that evaluation, and the importance of remaining in the ED until their evaluation is complete.  Loss of Coordination   Harmani Neto A, PA-C 03/12/22 1424

## 2022-03-13 ENCOUNTER — Encounter (HOSPITAL_COMMUNITY): Payer: Medicare HMO

## 2022-03-13 ENCOUNTER — Inpatient Hospital Stay (HOSPITAL_COMMUNITY): Payer: Medicare HMO

## 2022-03-13 ENCOUNTER — Emergency Department (HOSPITAL_COMMUNITY): Payer: Medicare HMO

## 2022-03-13 DIAGNOSIS — I639 Cerebral infarction, unspecified: Secondary | ICD-10-CM | POA: Diagnosis not present

## 2022-03-13 DIAGNOSIS — R29701 NIHSS score 1: Secondary | ICD-10-CM | POA: Diagnosis present

## 2022-03-13 DIAGNOSIS — E785 Hyperlipidemia, unspecified: Secondary | ICD-10-CM | POA: Diagnosis present

## 2022-03-13 DIAGNOSIS — I63411 Cerebral infarction due to embolism of right middle cerebral artery: Secondary | ICD-10-CM | POA: Diagnosis not present

## 2022-03-13 DIAGNOSIS — R270 Ataxia, unspecified: Secondary | ICD-10-CM | POA: Diagnosis present

## 2022-03-13 DIAGNOSIS — I6389 Other cerebral infarction: Secondary | ICD-10-CM | POA: Diagnosis present

## 2022-03-13 DIAGNOSIS — E119 Type 2 diabetes mellitus without complications: Secondary | ICD-10-CM | POA: Diagnosis present

## 2022-03-13 DIAGNOSIS — I6349 Cerebral infarction due to embolism of other cerebral artery: Secondary | ICD-10-CM | POA: Diagnosis present

## 2022-03-13 DIAGNOSIS — I712 Thoracic aortic aneurysm, without rupture, unspecified: Secondary | ICD-10-CM | POA: Diagnosis present

## 2022-03-13 DIAGNOSIS — Z79899 Other long term (current) drug therapy: Secondary | ICD-10-CM | POA: Diagnosis not present

## 2022-03-13 DIAGNOSIS — Z7984 Long term (current) use of oral hypoglycemic drugs: Secondary | ICD-10-CM | POA: Diagnosis not present

## 2022-03-13 DIAGNOSIS — I7121 Aneurysm of the ascending aorta, without rupture: Secondary | ICD-10-CM | POA: Diagnosis present

## 2022-03-13 DIAGNOSIS — Z8673 Personal history of transient ischemic attack (TIA), and cerebral infarction without residual deficits: Secondary | ICD-10-CM | POA: Diagnosis not present

## 2022-03-13 DIAGNOSIS — G8324 Monoplegia of upper limb affecting left nondominant side: Secondary | ICD-10-CM | POA: Diagnosis present

## 2022-03-13 DIAGNOSIS — E538 Deficiency of other specified B group vitamins: Secondary | ICD-10-CM | POA: Diagnosis present

## 2022-03-13 DIAGNOSIS — Z8042 Family history of malignant neoplasm of prostate: Secondary | ICD-10-CM | POA: Diagnosis not present

## 2022-03-13 DIAGNOSIS — Z8616 Personal history of COVID-19: Secondary | ICD-10-CM | POA: Diagnosis not present

## 2022-03-13 DIAGNOSIS — I10 Essential (primary) hypertension: Secondary | ICD-10-CM | POA: Diagnosis present

## 2022-03-13 DIAGNOSIS — E876 Hypokalemia: Secondary | ICD-10-CM | POA: Diagnosis present

## 2022-03-13 DIAGNOSIS — Z801 Family history of malignant neoplasm of trachea, bronchus and lung: Secondary | ICD-10-CM | POA: Diagnosis not present

## 2022-03-13 DIAGNOSIS — R590 Localized enlarged lymph nodes: Secondary | ICD-10-CM | POA: Diagnosis present

## 2022-03-13 DIAGNOSIS — R410 Disorientation, unspecified: Secondary | ICD-10-CM | POA: Diagnosis present

## 2022-03-13 DIAGNOSIS — Z87891 Personal history of nicotine dependence: Secondary | ICD-10-CM | POA: Diagnosis not present

## 2022-03-13 DIAGNOSIS — Z7982 Long term (current) use of aspirin: Secondary | ICD-10-CM | POA: Diagnosis not present

## 2022-03-13 LAB — URINALYSIS, ROUTINE W REFLEX MICROSCOPIC
Bilirubin Urine: NEGATIVE
Glucose, UA: NEGATIVE mg/dL
Ketones, ur: NEGATIVE mg/dL
Leukocytes,Ua: NEGATIVE
Nitrite: NEGATIVE
Protein, ur: NEGATIVE mg/dL
RBC / HPF: >50 RBC/hpf — ABNORMAL HIGH (ref 0–5)
Specific Gravity, Urine: 1.035 — ABNORMAL HIGH (ref 1.005–1.030)
pH: 7 (ref 5.0–8.0)

## 2022-03-13 LAB — TROPONIN I (HIGH SENSITIVITY)
Troponin I (High Sensitivity): 6 ng/L (ref <18)
Troponin I (High Sensitivity): 6 ng/L (ref <18)

## 2022-03-13 LAB — HEMOGLOBIN A1C
Hgb A1c MFr Bld: 6.7 % — ABNORMAL HIGH (ref 4.8–5.6)
Hgb A1c MFr Bld: 6.7 % — ABNORMAL HIGH (ref 4.8–5.6)
Mean Plasma Glucose: 145.59 mg/dL
Mean Plasma Glucose: 145.59 mg/dL

## 2022-03-13 LAB — ECHOCARDIOGRAM COMPLETE
Area-P 1/2: 1.97 cm2
Height: 71 in
S' Lateral: 2.7 cm

## 2022-03-13 LAB — CBG MONITORING, ED
Glucose-Capillary: 106 mg/dL — ABNORMAL HIGH (ref 70–99)
Glucose-Capillary: 165 mg/dL — ABNORMAL HIGH (ref 70–99)

## 2022-03-13 LAB — MAGNESIUM: Magnesium: 1.3 mg/dL — ABNORMAL LOW (ref 1.7–2.4)

## 2022-03-13 LAB — HIV ANTIBODY (ROUTINE TESTING W REFLEX): HIV Screen 4th Generation wRfx: NONREACTIVE

## 2022-03-13 LAB — GLUCOSE, CAPILLARY: Glucose-Capillary: 94 mg/dL (ref 70–99)

## 2022-03-13 MED ORDER — HYDROCHLOROTHIAZIDE 25 MG PO TABS: 25.0000 mg | ORAL_TABLET | Freq: Every day | ORAL | Status: DC

## 2022-03-13 MED ORDER — INSULIN ASPART 100 UNIT/ML IJ SOLN
0.0000 [IU] | Freq: Three times a day (TID) | INTRAMUSCULAR | Status: DC
  Administered 2022-03-13: 2 [IU] via SUBCUTANEOUS
  Administered 2022-03-14: 1 [IU] via SUBCUTANEOUS

## 2022-03-13 MED ORDER — ATORVASTATIN CALCIUM 40 MG PO TABS
40.0000 mg | ORAL_TABLET | Freq: Every day | ORAL | Status: DC
  Administered 2022-03-13 – 2022-03-15 (×3): 40 mg via ORAL
  Filled 2022-03-13 (×3): qty 1

## 2022-03-13 MED ORDER — ASPIRIN 81 MG PO TBEC
81.0000 mg | DELAYED_RELEASE_TABLET | Freq: Every day | ORAL | Status: DC
  Administered 2022-03-13 – 2022-03-15 (×3): 81 mg via ORAL
  Filled 2022-03-13 (×3): qty 1

## 2022-03-13 MED ORDER — HYDRALAZINE HCL 25 MG PO TABS: 25.0000 mg | ORAL_TABLET | Freq: Four times a day (QID) | ORAL | Status: DC | PRN

## 2022-03-13 MED ORDER — AMLODIPINE BESYLATE 5 MG PO TABS: 10.0000 mg | ORAL_TABLET | Freq: Every day | ORAL | Status: DC

## 2022-03-13 MED ORDER — LACTATED RINGERS IV BOLUS
1000.0000 mL | Freq: Once | INTRAVENOUS | Status: AC
  Administered 2022-03-13: 1000 mL via INTRAVENOUS

## 2022-03-13 MED ORDER — CLOPIDOGREL BISULFATE 75 MG PO TABS
75.0000 mg | ORAL_TABLET | Freq: Every day | ORAL | Status: DC
  Administered 2022-03-13 – 2022-03-15 (×3): 75 mg via ORAL
  Filled 2022-03-13 (×3): qty 1

## 2022-03-13 MED ORDER — IOHEXOL 350 MG/ML SOLN
75.0000 mL | Freq: Once | INTRAVENOUS | Status: AC | PRN
  Administered 2022-03-13: 75 mL via INTRAVENOUS

## 2022-03-13 MED ORDER — ENOXAPARIN SODIUM 40 MG/0.4ML IJ SOSY
40.0000 mg | PREFILLED_SYRINGE | INTRAMUSCULAR | Status: DC
  Administered 2022-03-13 – 2022-03-14 (×2): 40 mg via SUBCUTANEOUS
  Filled 2022-03-13 (×2): qty 0.4

## 2022-03-13 MED ORDER — ACETAMINOPHEN 650 MG RE SUPP: 650.0000 mg | RECTAL | Status: DC | PRN

## 2022-03-13 MED ORDER — STROKE: EARLY STAGES OF RECOVERY BOOK
Freq: Once | Status: AC
  Filled 2022-03-13: qty 1

## 2022-03-13 MED ORDER — ACETAMINOPHEN 325 MG PO TABS: 650.0000 mg | ORAL_TABLET | ORAL | Status: DC | PRN

## 2022-03-13 MED ORDER — ACETAMINOPHEN 160 MG/5ML PO SOLN: 650.0000 mg | ORAL | Status: DC | PRN

## 2022-03-13 MED ORDER — CARVEDILOL 3.125 MG PO TABS: 6.2500 mg | ORAL_TABLET | Freq: Two times a day (BID) | ORAL | Status: DC

## 2022-03-13 MED ORDER — LORAZEPAM 2 MG/ML IJ SOLN: 0.5000 mg | Freq: Four times a day (QID) | INTRAMUSCULAR | Status: DC | PRN

## 2022-03-13 MED ORDER — SENNOSIDES-DOCUSATE SODIUM 8.6-50 MG PO TABS: 1.0000 | ORAL_TABLET | Freq: Every evening | ORAL | Status: DC | PRN

## 2022-03-13 MED ORDER — POTASSIUM CHLORIDE 10 MEQ/100ML IV SOLN
10.0000 meq | INTRAVENOUS | Status: AC
  Administered 2022-03-13 (×2): 10 meq via INTRAVENOUS
  Filled 2022-03-13 (×2): qty 100

## 2022-03-13 NOTE — ED Provider Notes (Signed)
[facility] Provider Note   CSN: [id number] Arrival date & time: 03/12/22  1410     History {Add pertinent medical, surgical, social history, OB history to HPI:1} Chief Complaint  Patient presents with   loss of coordination     Gavin Dennis is a 71 y.o. male.  71 year old male who presents the ER today for difficulty using his hands.  Patient states he has a history of stroke in the past and had similar symptoms.  I reviewed the records it appears when he had a stroke he had multiple emboli thought to be cardiac or vascular cause.  He underwent multiple MRIs of the brain, echo his heart and other vascular studies did not show an obvious etiology.  He was started on Plavix for 3 weeks and aspirin indefinitely.  Compliant with his medications.  States that he woke up yesterday morning he was feeling fine he was watching TV and then when he abnormal but could not really quantify what that meant.  He went to grab his water and had trouble getting the cap off of it and then try to get the Back on was impossible.  Throughout the day he had continued difficulty with spatial relationships.  Stated that he would go to reach for something and he knew exactly what he wanted he could see what he wanted he could not get his hand to get to the thing it would not other things over next to it.  This happened in multiple instances throughout the day so he came to be evaluated.  He was outside of the stroke window on arrival to triage a CT head was done which was negative CBC BMP and urinalysis were also ordered.  At this time patient feels the same symptoms that they have not improved.        Home Medications Prior to Admission medications   Medication Sig Start Date End Date Taking? Authorizing Provider  amLODipine (NORVASC) 10 MG tablet Take 1 tablet (10 mg total) by mouth daily. 11/15/20 03/06/22  Kerin Perna, NP  aspirin EC 81 MG EC tablet Take 1 tablet  (81 mg total) by mouth daily. Swallow whole. 07/11/20   Lattie Haw, MD  atorvastatin (LIPITOR) 40 MG tablet Take 1 tablet (40 mg total) by mouth daily. 08/30/21   Janina Mayo, MD  carvedilol (COREG) 6.25 MG tablet Take 1 tablet (6.25 mg total) by mouth 2 (two) times daily. 03/06/22   Janina Mayo, MD  hydrochlorothiazide (HYDRODIURIL) 25 MG tablet Take 1 tablet (25 mg total) by mouth daily. 10/06/20   Kerin Perna, NP  ibuprofen (ADVIL) 600 MG tablet Take 1 tablet (600 mg total) by mouth every 6 (six) hours as needed. 11/13/21   Wilnette Kales, PA  metFORMIN (GLUCOPHAGE) 500 MG tablet Take 500 mg by mouth daily.    [provider]  vitamin B-12 (CYANOCOBALAMIN) 500 MCG tablet Take 500 mcg by mouth daily.    [provider]      Allergies    Patient has no known allergies.    Review of Systems   Review of Systems  Physical Exam Updated Vital Signs BP (!) 124/102 (BP Location: Left Arm)   Pulse 73   Temp 97.7 F (36.5 C) (Oral)   Resp 20   Ht 5\' 11"  (1.803 m)   SpO2 96%   BMI 29.68 kg/m  Physical Exam Vitals and nursing note reviewed.  Constitutional:  Appearance: He is well-developed.  HENT:     Head: Normocephalic and atraumatic.  Eyes:     Extraocular Movements: Extraocular movements intact.     Pupils: Pupils are equal, round, and reactive to light.  Cardiovascular:     Rate and Rhythm: Normal rate.  Pulmonary:     Effort: Pulmonary effort is normal. No respiratory distress.  Abdominal:     General: Abdomen is flat. There is no distension.  Musculoskeletal:        General: Normal range of motion.     Cervical back: Normal range of motion.  Neurological:     Mental Status: He is alert.     Comments: Finger-nose with the right hand is slow but normal.  He has quite a bit of difficulty with his left hand oftentimes overshooting my finger or touching it with his hand rather than his fingertip.  He also has difficulty touching his nose often  times touching his forehead or his eye.  Rapid alternating movements are significantly uncoordinated.  Visual fields were difficult because he had trouble continuing to look straight ahead but it did seem that he had a lower nasal quadrant deficit.  He could not accurately tell me how many fingers I was holding up and was wrong multiple times unless he looked at it directly.  He had decreased strength in his left tricep but his bicep seem to be consistent with his right.  Grip strength is intact.  Dorsiflexion and plantarflexion of both feet is similar.  Sensation is similar to light touch in both hands and legs.  Did not attempted ambulation.  Eyebrows raise symmetrically, extraocular movements intact, eye squint symmetrically, smiles symmetrically, tongue protrudes midline and soft palate raises symmetrically.     ED Results / Procedures / Treatments   Labs (all labs ordered are listed, but only abnormal results are displayed) Labs Reviewed  BASIC METABOLIC PANEL - Abnormal; Notable for the following components:      Result Value   Potassium 3.0 (*)    Glucose, Bld 143 (*)    BUN 24 (*)    Creatinine, Ser 1.45 (*)    Calcium 10.5 (*)    GFR, Estimated 52 (*)    All other components within normal limits  CBC WITH DIFFERENTIAL/PLATELET  URINALYSIS, ROUTINE W REFLEX MICROSCOPIC  MAGNESIUM  TROPONIN I (HIGH SENSITIVITY)    EKG None  Radiology DG Chest 2 View  Result Date: 03/13/2022 CLINICAL DATA:  71 year old male with history of stroke-like symptoms. Loss of coordination. EXAM: CHEST - 2 VIEW COMPARISON:  Chest x-ray 06/04/2020. FINDINGS: Linear opacities in the left base and right mid to lower lung have an appearance of platelike atelectasis or chronic scarring on today's examination, but are new compared to the prior study. No confluent consolidative airspace disease. No pleural effusions. No pneumothorax. No definite suspicious appearing pulmonary nodules or masses are noted. No  evidence of pulmonary edema. Heart size is normal. Atherosclerotic calcifications are noted within the tortuous thoracic aorta. IMPRESSION: 1. Increased subsegmental atelectasis and/or scarring in the lower lungs. 2. Aortic atherosclerosis. Electronically Signed   By: Vinnie Langton M.D.   On: 03/13/2022 06:00   CT HEAD WO CONTRAST (5MM)  Result Date: 03/12/2022 CLINICAL DATA:  Delirium.  Confusion. EXAM: CT HEAD WITHOUT CONTRAST TECHNIQUE: Contiguous axial images were obtained from the base of the skull through the vertex without intravenous contrast. RADIATION DOSE REDUCTION: This exam was performed according to the departmental dose-optimization program which includes automated exposure  control, adjustment of the mA and/or kV according to patient size and/or use of iterative reconstruction technique. COMPARISON:  CT examination dated July 10, 2020 FINDINGS: Brain: No evidence of acute infarction, hemorrhage, hydrocephalus, extra-axial collection or mass lesion/mass effect. Diffuse low-attenuation of the periventricular and subcortical white matter presumed advanced chronic microvascular ischemic changes. Mild cerebral atrophy. Vascular: No hyperdense vessel or unexpected calcification. Skull: Normal. Negative for fracture or focal lesion. Sinuses/Orbits: No acute finding. Other: None. IMPRESSION: 1. No acute intracranial abnormality. 2. Advanced chronic microvascular ischemic changes of the white matter and mild cerebral atrophy. Electronically Signed   By: Keane Police D.O.   On: 03/12/2022 16:04    Procedures Procedures  {Document cardiac monitor, telemetry assessment procedure when appropriate:1}  Medications Ordered in ED Medications  lactated ringers bolus 1,000 mL (has no administration in time range)    ED Course/ Medical Decision Making/ A&P                           Medical Decision Making Amount and/or Complexity of Data Reviewed Labs: ordered. Radiology: ordered. ECG/medicine  tests: ordered.  Certainly seems like he has some type of neurodeficit concern for possible cerebellar however does not explain all of his symptoms.  On review of the notes it also appears he has a pretty large thoracic aneurysm which very well could be what would be thrown clots if he is developing then there.  His outpatient cardiologist is already ordered a CT to evaluate this and it very well could be contributing to his symptoms here so we will get a CTA of his chest here along with MRI of his brain.  Would anticipate neurologic discussion either way based on the results of these. ***  {Document critical care time when appropriate:1} {Document review of labs and clinical decision tools ie heart score, Chads2Vasc2 etc:1}  {Document your independent review of radiology images, and any outside records:1} {Document your discussion with family members, caretakers, and with consultants:1} {Document social determinants of health affecting pt's care:1} {Document your decision making why or why not admission, treatments were needed:1} Final Clinical Impression(s) / ED Diagnoses Final diagnoses:  None    Rx / DC Orders ED Discharge Orders     None

## 2022-03-13 NOTE — ED Notes (Signed)
Pt transported back to MRI.

## 2022-03-13 NOTE — ED Notes (Signed)
Pt currently in MRI

## 2022-03-13 NOTE — ED Provider Notes (Signed)
  Physical Exam  BP 120/72 (BP Location: Right Arm)   Pulse 60   Temp 97.9 F (36.6 C) (Oral)   Resp 18   Ht 5\' 11"  (1.803 m)   SpO2 100%   BMI 29.68 kg/m   Physical Exam  Procedures  Procedures  ED Course / MDM    Medical Decision Making Amount and/or Complexity of Data Reviewed Labs: ordered. Radiology: ordered. ECG/medicine tests: ordered.  Risk Prescription drug management. Decision regarding hospitalization.   Had assumed care of this patient from Dr. Dayna Barker. MRI confirms a stroke.  Hospitalist has been consulted along with neuro hospitalist.       Varney Biles, MD 03/13/22 [phone]

## 2022-03-13 NOTE — ED Notes (Signed)
ED TO INPATIENT HANDOFF REPORT  ED Nurse Name and Phone #: Caryl Pina RN [phone]  S Name/Age/Gender [name] 71 y.o. male Room/Bed: H012C/H012C  Code Status   Code Status: Full Code  Home/SNF/Other Home Patient oriented to: self, place, time, and situation Is this baseline? Yes   Triage Complete: Triage complete  Chief Complaint Stroke (cerebrum) [facility]) [I63.9]  Triage Note Pt arrived POV from home stating starting at 5am this morning he was having coordination issues. Pt states things that are simple all of a sudden became complicated. Pt was having issue with operating a fork while trying to eat, issues with trying to put a lid back on a jug. Pt denies any pain or weakness or any other neuro symptoms.    Allergies No Known Allergies  Level of Care/Admitting Diagnosis ED Disposition     ED Disposition  Admit   Condition  --   Comment  Hospital Area: [facility] [[id number]]  Level of Care: Telemetry Medical [104]  May admit patient to [facility] or [facility] if equivalent level of care is available:: No  Covid Evaluation: Asymptomatic - no recent exposure (last 10 days) testing not required  Diagnosis: Stroke (cerebrum) [facility]) [[id number]]  Admitting Physician: Lequita Halt [[id number]]  Attending Physician: Lequita Halt [[id number]]  Certification:: I certify this patient will need inpatient services for at least 2 midnights  Estimated Length of Stay: 2          B Medical/Surgery History Past Medical History:  Diagnosis Date   Blurred vision, left eye    since stroke 06/2020   CVA (cerebral vascular accident) ([place]) 06/2020   DDD (degenerative disc disease), lumbar    Hyperlipidemia    Hypertension    Hypomagnesemia    Polio    childhood   Vitamin B 12 deficiency    History reviewed. No pertinent surgical history.   A IV Location/Drains/Wounds Patient Lines/Drains/Airways Status     Active Line/Drains/Airways     Name Placement date  Placement time Site Days   Peripheral IV 03/13/22 18 G 1.16" Left Antecubital 03/13/22  0624  Antecubital  less than 1            Intake/Output Last 24 hours  Intake/Output Summary (Last 24 hours) at 03/13/2022 1658 Last data filed at 03/13/2022 1317 Gross per 24 hour  Intake 200 ml  Output --  Net 200 ml    Labs/Imaging Results for orders placed or performed during the hospital encounter of 03/12/22 (from the past 48 hour(s))  Urinalysis, Routine w reflex microscopic     Status: Abnormal   Collection Time: 03/12/22 10:27 AM  Result Value Ref Range   Color, Urine YELLOW YELLOW   APPearance CLEAR CLEAR   Specific Gravity, Urine 1.035 (H) 1.005 - 1.030   pH 7.0 5.0 - 8.0   Glucose, UA NEGATIVE NEGATIVE mg/dL   Hgb urine dipstick MODERATE (A) NEGATIVE   Bilirubin Urine NEGATIVE NEGATIVE   Ketones, ur NEGATIVE NEGATIVE mg/dL   Protein, ur NEGATIVE NEGATIVE mg/dL   Nitrite NEGATIVE NEGATIVE   Leukocytes,Ua NEGATIVE NEGATIVE   RBC / HPF >50 (H) 0 - 5 RBC/hpf   WBC, UA 0-5 0 - 5 WBC/hpf   Bacteria, UA RARE (A) NONE SEEN   Mucus PRESENT     Comment: Performed at [facility], [address] [place], [place] [address]  CBC with Differential     Status: None   Collection Time: 03/12/22  2:39  PM  Result Value Ref Range   WBC 5.1 4.0 - 10.5 K/uL   RBC 4.86 4.22 - 5.81 MIL/uL   Hemoglobin 14.3 13.0 - 17.0 g/dL   HCT 42.8 39.0 - 52.0 %   MCV 88.1 80.0 - 100.0 fL   MCH 29.4 26.0 - 34.0 pg   MCHC 33.4 30.0 - 36.0 g/dL   RDW 14.8 11.5 - 15.5 %   Platelets 268 150 - 400 K/uL   nRBC 0.0 0.0 - 0.2 %   Neutrophils Relative % 50 %   Neutro Abs 2.5 1.7 - 7.7 K/uL   Lymphocytes Relative 32 %   Lymphs Abs 1.6 0.7 - 4.0 K/uL   Monocytes Relative 12 %   Monocytes Absolute 0.6 0.1 - 1.0 K/uL   Eosinophils Relative 5 %   Eosinophils Absolute 0.3 0.0 - 0.5 K/uL   Basophils Relative 1 %   Basophils Absolute 0.0 0.0 - 0.1 K/uL   Immature Granulocytes 0 %   Abs Immature  Granulocytes 0.02 0.00 - 0.07 K/uL    Comment: Performed at [facility], [address], [place], [place] [address]  Basic metabolic panel     Status: Abnormal   Collection Time: 03/12/22  2:39 PM  Result Value Ref Range   Sodium 139 135 - 145 mmol/L   Potassium 3.0 (L) 3.5 - 5.1 mmol/L   Chloride 101 98 - 111 mmol/L   CO2 28 22 - 32 mmol/L   Glucose, Bld 143 (H) 70 - 99 mg/dL    Comment: Glucose reference range applies only to samples taken after fasting for at least 8 hours.   BUN 24 (H) 8 - 23 mg/dL   Creatinine, Ser 1.45 (H) 0.61 - 1.24 mg/dL   Calcium 10.5 (H) 8.9 - 10.3 mg/dL   GFR, Estimated 52 (L) >60 mL/min    Comment: (NOTE) Calculated using the CKD-EPI Creatinine Equation (2021)    Anion gap 10 5 - 15    Comment: Performed at [facility] [address], [place], [place] [address]  Troponin I (High Sensitivity)     Status: None   Collection Time: 03/13/22  6:27 AM  Result Value Ref Range   Troponin I (High Sensitivity) 6 <18 ng/L    Comment: (NOTE) Elevated high sensitivity troponin I (hsTnI) values and significant  changes across serial measurements may suggest ACS but many other  chronic and acute conditions are known to elevate hsTnI results.  Refer to the "Links" section for chest pain algorithms and additional  guidance. Performed at [facility], [place] [address], [place], [place] [address]   Magnesium     Status: Abnormal   Collection Time: 03/13/22  6:27 AM  Result Value Ref Range   Magnesium 1.3 (L) 1.7 - 2.4 mg/dL    Comment: Performed at [facility] [address] [place], [place] [address]  Troponin I (High Sensitivity)     Status: None   Collection Time: 03/13/22  7:35 AM  Result Value Ref Range   Troponin I (High Sensitivity) 6 <18 ng/L    Comment: (NOTE) Elevated high sensitivity troponin I (hsTnI) values and significant  changes across serial measurements may suggest ACS but many other  chronic and acute conditions are  known to elevate hsTnI results.  Refer to the "Links" section for chest pain algorithms and additional  guidance. Performed at [facility], [place] [address], [place], [place] [address]   HIV Antibody (  routine testing w rflx)     Status: None   Collection Time: 03/13/22 11:24 AM  Result Value Ref Range   HIV Screen 4th Generation wRfx Non Reactive Non Reactive    Comment: Performed at [facility], [place] [address], [place], [place] [address]  Hemoglobin A1c     Status: Abnormal   Collection Time: 03/13/22 11:25 AM  Result Value Ref Range   Hgb A1c MFr Bld 6.7 (H) 4.8 - 5.6 %    Comment: (NOTE) Pre diabetes:          5.7%-6.4%  Diabetes:              >6.4%  Glycemic control for   <7.0% adults with diabetes    Mean Plasma Glucose 145.59 mg/dL    Comment: Performed at [facility] [address] [place], [place] [address]  CBG monitoring, ED     Status: Abnormal   Collection Time: 03/13/22 12:22 PM  Result Value Ref Range   Glucose-Capillary 106 (H) 70 - 99 mg/dL    Comment: Glucose reference range applies only to samples taken after fasting for at least 8 hours.  CBG monitoring, ED     Status: Abnormal   Collection Time: 03/13/22  4:18 PM  Result Value Ref Range   Glucose-Capillary 165 (H) 70 - 99 mg/dL    Comment: Glucose reference range applies only to samples taken after fasting for at least 8 hours.   VAS US CAROTID  Result Date: 03/13/2022 Carotid Arterial Duplex Study Patient Name:  TORIAN QUINTERO  Date of Exam:   03/13/2022 Medical Rec #: [id number]     Accession #:    [id number] Date of Birth: 1950/11/14     Patient Gender: M Patient Age:   40 years Exam Location:  [facility] Procedure:      VAS US CAROTID Referring Phys: Amie Portland --------------------------------------------------------------------------------  Risk Factors:      Hypertension, hyperlipidemia, Diabetes, past history of                    smoking, prior CVA. Comparison Study:  No  previous Performing Technologist: Jody Hill RVT, RDMS  Examination Guidelines: A complete evaluation includes B-mode imaging, spectral Doppler, color Doppler, and power Doppler as needed of all accessible portions of each vessel. Bilateral testing is considered an integral part of a complete examination. Limited examinations for reoccurring indications may be performed as noted.  Right Carotid Findings: +----------+--------+--------+--------+------------------+------------------+           PSV cm/sEDV cm/sStenosisPlaque DescriptionComments           +----------+--------+--------+--------+------------------+------------------+ CCA Prox  48      11                                intimal thickening +----------+--------+--------+--------+------------------+------------------+ CCA Distal37      12                                intimal thickening +----------+--------+--------+--------+------------------+------------------+ ICA Prox  32      13                                                   +----------+--------+--------+--------+------------------+------------------+ ICA  Distal36      17                                                   +----------+--------+--------+--------+------------------+------------------+ ECA       46      9                                                    +----------+--------+--------+--------+------------------+------------------+ +----------+--------+-------+----------------+-------------------+           PSV cm/sEDV cmsDescribe        Arm Pressure (mmHG) +----------+--------+-------+----------------+-------------------+ [id number]             Multiphasic, WNL                    +----------+--------+-------+----------------+-------------------+ +---------+--------+--+--------+-+---------+ VertebralPSV cm/s21EDV cm/s6Antegrade +---------+--------+--+--------+-+---------+  Left Carotid Findings:  +----------+--------+--------+--------+------------------+------------------+           PSV cm/sEDV cm/sStenosisPlaque DescriptionComments           +----------+--------+--------+--------+------------------+------------------+ CCA Prox  61      14                                                   +----------+--------+--------+--------+------------------+------------------+ CCA Distal36      11                                intimal thickening +----------+--------+--------+--------+------------------+------------------+ ICA Prox  29      10                                                   +----------+--------+--------+--------+------------------+------------------+ ICA Distal39      17                                                   +----------+--------+--------+--------+------------------+------------------+ ECA       44      7                                                    +----------+--------+--------+--------+------------------+------------------+ +----------+--------+--------+----------------+-------------------+           PSV cm/sEDV cm/sDescribe        Arm Pressure (mmHG) +----------+--------+--------+----------------+-------------------+ [id number]              Multiphasic, WNL                    +----------+--------+--------+----------------+-------------------+ +---------+--------+--+--------+-+---------+ VertebralPSV cm/s18EDV cm/s4Antegrade +---------+--------+--+--------+-+---------+   Summary: Right Carotid: The extracranial vessels were near-normal with only minimal wall  thickening or plaque. Left Carotid: The extracranial vessels were near-normal with only minimal wall               thickening or plaque. Vertebrals:  Bilateral vertebral arteries demonstrate antegrade flow. Subclavians: Normal flow hemodynamics were seen in bilateral subclavian              arteries. *See table(s) above for measurements and  observations.     Preliminary    ECHOCARDIOGRAM COMPLETE  Result Date: 03/13/2022    ECHOCARDIOGRAM REPORT   Patient Name:   Demarr Kluever Date of Exam: 03/13/2022 Medical Rec #:  [id number]    Height:       71.0 in Accession #:    [id number]   Weight:       212.8 lb Date of Birth:  02/16/1951    BSA:          2.165 m Patient Age:    8 years     BP:           120/72 mmHg Patient Gender: M            HR:           60 bpm. Exam Location:  [facility] Procedure: 2D Echo, Cardiac Doppler and Color Doppler Indications:    Stroke I63.9  History:        Patient has prior history of Echocardiogram examinations, most                 recent 07/09/2020. Stroke; Risk Factors:Hypertension,                 Dyslipidemia and Diabetes.  Sonographer:    Alvino Chapel RCS Referring Phys: Lequita Halt IMPRESSIONS  1. Left ventricular ejection fraction, by estimation, is 60 to 65%. The left ventricle has normal function. The left ventricle has no regional wall motion abnormalities. There is mild left ventricular hypertrophy. Left ventricular diastolic parameters were normal.  2. Right ventricular systolic function is normal. The right ventricular size is normal. There is mildly elevated pulmonary artery systolic pressure.  3. Left atrial size was mildly dilated.  4. The mitral valve is abnormal. Trivial mitral valve regurgitation. No evidence of mitral stenosis.  5. The aortic valve is tricuspid. There is mild calcification of the aortic valve. There is mild thickening of the aortic valve. Aortic valve regurgitation is not visualized. Aortic valve sclerosis is present, with no evidence of aortic valve stenosis.  6. Aortic dilatation noted. There is moderate dilatation of the ascending aorta, measuring 40 mm.  7. The inferior vena cava is normal in size with greater than 50% respiratory variability, suggesting right atrial pressure of 3 mmHg. FINDINGS  Left Ventricle: Left ventricular ejection fraction, by estimation, is 60 to 65%. The  left ventricle has normal function. The left ventricle has no regional wall motion abnormalities. The left ventricular internal cavity size was normal in size. There is  mild left ventricular hypertrophy. Left ventricular diastolic parameters were normal. Right Ventricle: The right ventricular size is normal. No increase in right ventricular wall thickness. Right ventricular systolic function is normal. There is mildly elevated pulmonary artery systolic pressure. The tricuspid regurgitant velocity is 2.89  m/s, and with an assumed right atrial pressure of 3 mmHg, the estimated right ventricular systolic pressure is 07.3 mmHg. Left Atrium: Left atrial size was mildly dilated. Right Atrium: Right atrial size was normal in size. Pericardium: There is no evidence of pericardial effusion. Mitral Valve: The mitral valve  is abnormal. There is mild thickening of the mitral valve leaflet(s). Trivial mitral valve regurgitation. No evidence of mitral valve stenosis. Tricuspid Valve: The tricuspid valve is normal in structure. Tricuspid valve regurgitation is mild . No evidence of tricuspid stenosis. Aortic Valve: The aortic valve is tricuspid. There is mild calcification of the aortic valve. There is mild thickening of the aortic valve. Aortic valve regurgitation is not visualized. Aortic valve sclerosis is present, with no evidence of aortic valve stenosis. Pulmonic Valve: The pulmonic valve was normal in structure. Pulmonic valve regurgitation is not visualized. No evidence of pulmonic stenosis. Aorta: Aortic dilatation noted. There is moderate dilatation of the ascending aorta, measuring 40 mm. Venous: The inferior vena cava is normal in size with greater than 50% respiratory variability, suggesting right atrial pressure of 3 mmHg. IAS/Shunts: No atrial level shunt detected by color flow Doppler.  LEFT VENTRICLE PLAX 2D LVIDd:         4.50 cm   Diastology LVIDs:         2.70 cm   LV e' medial:    6.74 cm/s LV PW:          1.00 cm   LV E/e' medial:  11.1 LV IVS:        1.30 cm   LV e' lateral:   10.70 cm/s LVOT diam:     1.90 cm   LV E/e' lateral: 7.0 LV SV:         54 LV SV Index:   25 LVOT Area:     2.84 cm  RIGHT VENTRICLE RV S prime:     16.00 cm/s TAPSE (M-mode): 2.1 cm LEFT ATRIUM             Index        RIGHT ATRIUM           Index LA diam:        4.20 cm 1.94 cm/m   RA Area:     23.40 cm LA Vol (A2C):   49.1 ml 22.68 ml/m  RA Volume:   62.50 ml  28.87 ml/m LA Vol (A4C):   60.0 ml 27.72 ml/m LA Biplane Vol: 54.3 ml 25.08 ml/m  AORTIC VALVE LVOT Vmax:   95.80 cm/s LVOT Vmean:  63.900 cm/s LVOT VTI:    0.189 m  AORTA Ao Root diam: 4.00 cm MITRAL VALVE                TRICUSPID VALVE MV Area (PHT): 1.97 cm     TR Peak grad:   33.4 mmHg MV Decel Time: 386 msec     TR Vmax:        289.00 cm/s MV E velocity: 74.90 cm/s MV A velocity: 131.00 cm/s  SHUNTS MV E/A ratio:  0.57         Systemic VTI:  0.19 m                             Systemic Diam: 1.90 cm Jenkins Rouge MD Electronically signed by Jenkins Rouge MD Signature Date/Time: 03/13/2022/1:19:48 PM    Final    MR ANGIO HEAD WO CONTRAST  Result Date: 03/13/2022 CLINICAL DATA:  Provided history: Neuro deficit, acute, stroke suspected. EXAM: MRA HEAD WITHOUT CONTRAST MRA NECK WITHOUT CONTRAST TECHNIQUE: Angiographic images of the Circle of Willis were acquired using MRA technique without intravenous contrast. Angiographic images of the neck were acquired using MRA technique without intravenous contrast. Carotid  stenosis measurements (when applicable) are obtained utilizing NASCET criteria, using the distal internal carotid diameter as the denominator. COMPARISON:  Brain MRI performed earlier today 03/13/2022. CT angiogram head/neck 07/10/2020. FINDINGS: MRA HEAD FINDINGS Anterior circulation: The intracranial internal carotid arteries are patent. The M1 middle cerebral arteries are patent. No M2 proximal branch occlusion or high-grade proximal stenosis. The anterior  cerebral arteries are patent. No intracranial aneurysm is identified. Posterior circulation: The intracranial vertebral arteries are patent. The basilar artery is patent. The right PCA is fetal in origin. Developmentally diminutive left P1 segment with sizable left posterior communicating artery. Anatomic variants: As described. MRA NECK FINDINGS Aortic arch: Excluded from the field of view. Right carotid system: Noncontrast technique limits evaluation of the proximal common carotid arteries. Within this limitation, the common carotid and internal carotid arteries are patent within the neck without appreciable hemodynamically significant stenosis. Left carotid system: Noncontrast technique limits evaluation of the proximal common carotid arteries. Within this limitation, the common carotid and internal carotid arteries are patent within the neck without appreciable hemodynamically significant stenosis. Vertebral arteries: Motion degradation and non-contrast technique preclude evaluation of the left vertebral artery V1 segment, and the very proximal right vertebral artery V1 segment. Additionally, non-contrast technique precludes evaluation of the bilateral vertebral artery V3 segments. Elsewhere, the vertebral arteries are patent within the neck without appreciable hemodynamically significant stenosis. IMPRESSION: MRA head: No intracranial large vessel occlusion or proximal high-grade arterial stenosis. MRA neck: 1. Non-contrast technique limits evaluation of the proximal common carotid arteries. Within this limitation, the common carotid and internal carotid arteries are patent within the neck without hemodynamically significant stenosis. 2. Motion degradation and non-contrast technique precludes evaluation of the left vertebral artery V1 segment, and the right vertebral artery proximal V1 segment. Additionally, non-contrast technique precludes evaluation of the bilateral vertebral artery V3 segments. Elsewhere,  the vertebral arteries are patent within the neck without appreciable hemodynamically significant stenosis. Electronically Signed   By: Kellie Simmering D.O.   On: 03/13/2022 10:58   MR ANGIO NECK WO CONTRAST  Result Date: 03/13/2022 CLINICAL DATA:  Provided history: Neuro deficit, acute, stroke suspected. EXAM: MRA HEAD WITHOUT CONTRAST MRA NECK WITHOUT CONTRAST TECHNIQUE: Angiographic images of the Circle of Willis were acquired using MRA technique without intravenous contrast. Angiographic images of the neck were acquired using MRA technique without intravenous contrast. Carotid stenosis measurements (when applicable) are obtained utilizing NASCET criteria, using the distal internal carotid diameter as the denominator. COMPARISON:  Brain MRI performed earlier today 03/13/2022. CT angiogram head/neck 07/10/2020. FINDINGS: MRA HEAD FINDINGS Anterior circulation: The intracranial internal carotid arteries are patent. The M1 middle cerebral arteries are patent. No M2 proximal branch occlusion or high-grade proximal stenosis. The anterior cerebral arteries are patent. No intracranial aneurysm is identified. Posterior circulation: The intracranial vertebral arteries are patent. The basilar artery is patent. The right PCA is fetal in origin. Developmentally diminutive left P1 segment with sizable left posterior communicating artery. Anatomic variants: As described. MRA NECK FINDINGS Aortic arch: Excluded from the field of view. Right carotid system: Noncontrast technique limits evaluation of the proximal common carotid arteries. Within this limitation, the common carotid and internal carotid arteries are patent within the neck without appreciable hemodynamically significant stenosis. Left carotid system: Noncontrast technique limits evaluation of the proximal common carotid arteries. Within this limitation, the common carotid and internal carotid arteries are patent within the neck without appreciable hemodynamically  significant stenosis. Vertebral arteries: Motion degradation and non-contrast technique preclude evaluation of the left vertebral artery  V1 segment, and the very proximal right vertebral artery V1 segment. Additionally, non-contrast technique precludes evaluation of the bilateral vertebral artery V3 segments. Elsewhere, the vertebral arteries are patent within the neck without appreciable hemodynamically significant stenosis. IMPRESSION: MRA head: No intracranial large vessel occlusion or proximal high-grade arterial stenosis. MRA neck: 1. Non-contrast technique limits evaluation of the proximal common carotid arteries. Within this limitation, the common carotid and internal carotid arteries are patent within the neck without hemodynamically significant stenosis. 2. Motion degradation and non-contrast technique precludes evaluation of the left vertebral artery V1 segment, and the right vertebral artery proximal V1 segment. Additionally, non-contrast technique precludes evaluation of the bilateral vertebral artery V3 segments. Elsewhere, the vertebral arteries are patent within the neck without appreciable hemodynamically significant stenosis. Electronically Signed   By: Kellie Simmering D.O.   On: 03/13/2022 10:58   MR BRAIN WO CONTRAST  Result Date: 03/13/2022 CLINICAL DATA:  Confusion EXAM: MRI HEAD WITHOUT CONTRAST TECHNIQUE: Multiplanar, multiecho pulse sequences of the brain and surrounding structures were obtained without intravenous contrast. COMPARISON:  CT head 1 day prior FINDINGS: Brain: There is diffusion restriction with associated FLAIR signal abnormality in the right centrum semiovale extending to the parietal lobe cortex with some involvement of the postcentral gyrus consistent with acute infarct. There is no associated hemorrhage or mass effect. There is no other evidence of acute infarct. There is no acute intracranial hemorrhage or extra-axial fluid collection. There is mild background  parenchymal volume loss the ventricles are stable in size. There is extensive confluent FLAIR signal abnormality in the supratentorial white matter and pons consistent with advanced chronic small vessel ischemic change. There are small remote infarcts in the bilateral basal gangl,ia thalami, and right cerebellar hemisphere. There are scattered punctate chronic microhemorrhages in the brainstem, thalami, basal ganglia, and occipital lobe, likely hypertensive. There is no mass lesion.  There is no mass effect or midline shift. Vascular: Normal flow voids. Skull and upper cervical spine: Normal marrow signal. Sinuses/Orbits: The paranasal sinuses are clear. The globes and orbits are unremarkable. Other: None. IMPRESSION: 1. 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 2. Small remote lacunar infarcts and background advanced chronic small vessel ischemic change. 3. Scattered punctate chronic microhemorrhages are likely hypertensive in etiology. These results were called by telephone at the time of interpretation on 03/13/2022 at 9:08 am to provider Dr Kathrynn Humble, who verbally acknowledged these results. Electronically Signed   By: Valetta Mole M.D.   On: 03/13/2022 09:08   CT Angio Chest Aorta W and/or Wo Contrast  Result Date: 03/13/2022 CLINICAL DATA:  71 year old male with stroke-like symptoms, loss of coordination, 4.5 cm fusiform ascending thoracic aortic aneurysm on lung cancer screening chest CT in March. EXAM: CT ANGIOGRAPHY CHEST WITH CONTRAST TECHNIQUE: Multidetector CT imaging of the chest was performed using the standard protocol during bolus administration of intravenous contrast. Multiplanar CT image reconstructions and MIPs were obtained to evaluate the vascular anatomy. RADIATION DOSE REDUCTION: This exam was performed according to the departmental dose-optimization program which includes automated exposure control, adjustment of  the mA and/or kV according to patient size and/or use of iterative reconstruction technique. CONTRAST:  38mL OMNIPAQUE IOHEXOL 350 MG/ML SOLN COMPARISON:  Noncontrast chest CT 07/01/2021. FINDINGS: Cardiovascular: Pre contrast and postcontrast CTA images. Satisfactory aortic contrast but dominant pulmonary artery contrast timing. Stable ascending aorta size and configuration. Stable tortuosity of the arch. Calcified aortic atherosclerosis. Negative for thoracic aortic dissection. Mild  mixing artifact in the distal visible aorta. Visible proximal abdominal aortic branches appear to remain patent. No pulmonary artery filling defect identified. Mild cardiomegaly which might be eccentric rated by lower lung volumes today. No pericardial effusion. Mediastinum/Nodes: Generalized increased mediastinal and right hilar lymph nodes since March, with individual nodal tissue now 15-20 mm maximal short axis (subcentimeter in March). No discrete mediastinal mass. No axillary lymphadenopathy. Lungs/Pleura: Centrilobular emphysema. Major airways are patent. Mildly lower lung volumes with increased platelike left lower lobe atelectasis. Platelike opacity also in the right upper lobe along the minor fissure now. Stable small 3-4 mm right lung nodule series 9, image 51 since 07/01/2021. Mild bronchiectasis and architectural distortion in the left upper lobe on series 9, image 81 is stable. No pleural effusion or acute pulmonary inflammation identified. Upper Abdomen: Negative mostly noncontrast visible liver, gallbladder, spleen, pancreas, adrenal glands in the upper abdomen. Bilateral low-density renal areas with simple fluid density are most likely benign cysts (no follow-up imaging recommended). Visible kidneys are nonobstructed. Decompressed stomach with bulky duodenal and/or proximal small bowel diverticula measuring 5.4 cm on series 7, image 138. This was partially visible in March. Otherwise negative visible bowel.  Musculoskeletal: No acute or suspicious osseous lesion identified. Review of the MIP images confirms the above findings. IMPRESSION: 1. Nonspecific new Mediastinal And Right Hilar Lymphadenopathy since March this year. No explanatory underlying pneumonia or pulmonary inflammation is identified. Lymphoproliferative disorder or metastatic disease cannot be excluded. 2. But chronic lung disease with Emphysema (ICD10-J43.9) appears stable from Low-dose Screening Chest CT 07/01/2021 except for new plate-like atelectasis. Recommend repeat low-dose chest CT without contrast (please use the following order, "CT CHEST LCS NODULE FOLLOW-UP W/O CM" ). 3. Negative for thoracic aortic dissection. No evidence of pulmonary embolus stable Ascending thoracic aortic 4.5 cm aneurysm. Ascending thoracic. Aortic aneurysm. Recommend semi-annual imaging followup by CTA or MRA and referral to cardiothoracic surgery if not already obtained. This recommendation follows 2010 ACCF/AHA/AATS/ACR/ASA/SCA/SCAI/SIR/STS/SVM Guidelines for the diagnosis and Management of Patients With Thoracic Aortic Disease. Circulaton. 2010; 121: A630-Z601. Aortic aneurysm NOS (ICD10-I71.9). Aortic Atherosclerosis (ICD10-I70.0) Electronically Signed   By: Genevie Ann M.D.   On: 03/13/2022 07:52   DG Chest 2 View  Result Date: 03/13/2022 CLINICAL DATA:  71 year old male with history of stroke-like symptoms. Loss of coordination. EXAM: CHEST - 2 VIEW COMPARISON:  Chest x-ray 06/04/2020. FINDINGS: Linear opacities in the left base and right mid to lower lung have an appearance of platelike atelectasis or chronic scarring on today's examination, but are new compared to the prior study. No confluent consolidative airspace disease. No pleural effusions. No pneumothorax. No definite suspicious appearing pulmonary nodules or masses are noted. No evidence of pulmonary edema. Heart size is normal. Atherosclerotic calcifications are noted within the tortuous thoracic aorta.  IMPRESSION: 1. Increased subsegmental atelectasis and/or scarring in the lower lungs. 2. Aortic atherosclerosis. Electronically Signed   By: Vinnie Langton M.D.   On: 03/13/2022 06:00   CT HEAD WO CONTRAST (5MM)  Result Date: 03/12/2022 CLINICAL DATA:  Delirium.  Confusion. EXAM: CT HEAD WITHOUT CONTRAST TECHNIQUE: Contiguous axial images were obtained from the base of the skull through the vertex without intravenous contrast. RADIATION DOSE REDUCTION: This exam was performed according to the departmental dose-optimization program which includes automated exposure control, adjustment of the mA and/or kV according to patient size and/or use of iterative reconstruction technique. COMPARISON:  CT examination dated July 10, 2020 FINDINGS: Brain: No evidence of acute infarction, hemorrhage, hydrocephalus, extra-axial collection or mass  lesion/mass effect. Diffuse low-attenuation of the periventricular and subcortical white matter presumed advanced chronic microvascular ischemic changes. Mild cerebral atrophy. Vascular: No hyperdense vessel or unexpected calcification. Skull: Normal. Negative for fracture or focal lesion. Sinuses/Orbits: No acute finding. Other: None. IMPRESSION: 1. No acute intracranial abnormality. 2. Advanced chronic microvascular ischemic changes of the white matter and mild cerebral atrophy. Electronically Signed   By: Keane Police D.O.   On: 03/12/2022 16:04    Pending Labs Unresulted Labs (From admission, onward)     Start     Ordered   03/20/22 0500  Creatinine, serum  (enoxaparin (LOVENOX)    CrCl >/= 30 ml/min)  Weekly,   R     Comments: while on enoxaparin therapy    03/13/22 0940   03/14/22 0500  CBC  Tomorrow morning,   R        03/13/22 0921   03/14/22 3016  Basic metabolic panel  Tomorrow morning,   R        03/13/22 0921   03/14/22 0500  Lipid panel  (Labs)  Tomorrow morning,   R       Comments: Fasting    03/13/22 0940   03/13/22 0939  Hemoglobin A1c  Once,   R        Comments: To assess prior glycemic control    03/13/22 0940            Vitals/Pain Today's Vitals   03/13/22 0746 03/13/22 1122 03/13/22 1342 03/13/22 1528  BP: 120/72 (!) 124/92  (!) 133/102  Pulse: 60 79  84  Resp: 18 20  20   Temp: 97.9 F (36.6 C) 98.4 F (36.9 C) 97.9 F (36.6 C)   TempSrc: Oral Oral    SpO2: 100% 96%  96%  Height:      PainSc:        Isolation Precautions No active isolations  Medications Medications  LORazepam (ATIVAN) injection 0.5 mg (has no administration in time range)  aspirin EC tablet 81 mg (81 mg Oral Given 03/13/22 1101)  atorvastatin (LIPITOR) tablet 40 mg (40 mg Oral Given 03/13/22 1101)   stroke: early stages of recovery book (has no administration in time range)  acetaminophen (TYLENOL) tablet 650 mg (has no administration in time range)    Or  acetaminophen (TYLENOL) 160 MG/5ML solution 650 mg (has no administration in time range)    Or  acetaminophen (TYLENOL) suppository 650 mg (has no administration in time range)  senna-docusate (Senokot-S) tablet 1 tablet (has no administration in time range)  enoxaparin (LOVENOX) injection 40 mg (40 mg Subcutaneous Given 03/13/22 1100)  insulin aspart (novoLOG) injection 0-9 Units (2 Units Subcutaneous Given 03/13/22 1634)  clopidogrel (PLAVIX) tablet 75 mg (75 mg Oral Given 03/13/22 1101)  hydrALAZINE (APRESOLINE) tablet 25 mg (has no administration in time range)  lactated ringers bolus 1,000 mL (0 mLs Intravenous Stopped 03/13/22 0711)  iohexol (OMNIPAQUE) 350 MG/ML injection 75 mL (75 mLs Intravenous Contrast Given 03/13/22 0722)  potassium chloride 10 mEq in 100 mL IVPB (0 mEq Intravenous Stopped 03/13/22 1317)    Mobility walks with device Low fall risk   Focused Assessments Neuro Assessment Handoff:  Swallow screen pass? Yes    NIH Stroke Scale ( + Modified Stroke Scale Criteria)  Interval: Initial Level of Consciousness (1a.)   : Alert, keenly responsive LOC  Questions (1b. )   +: Answers both questions correctly LOC Commands (1c. )   + : Performs both tasks correctly Best Gaze (2. )  +:  Normal Visual (3. )  +: No visual loss Facial Palsy (4. )    : Normal symmetrical movements Motor Arm, Left (5a. )   +: No drift Motor Arm, Right (5b. )   +: No drift Motor Leg, Left (6a. )   +: No drift Motor Leg, Right (6b. )   +: No drift Limb Ataxia (7. ): Present in one limb Sensory (8. )   +: Normal, no sensory loss Best Language (9. )   +: No aphasia Dysarthria (10. ): Normal Extinction/Inattention (11.)   +: No Abnormality Modified SS Total  +: 0 Complete NIHSS TOTAL: 2     Neuro Assessment:   Neuro Checks:   Initial (03/13/22 0913)  Last Documented NIHSS Modified Score: 0 (03/13/22 1319) Has TPA been given? No If patient is a Neuro Trauma and patient is going to OR before floor call report to [facility] nurse: [phone] or [phone]   R Recommendations: See Admitting Provider Note  Report given to:   Additional Notes: Pt states that he has been having difficulty with coordination. States "I just can't do easy things that I have done my whole life, it was hard to eat my food". A&Ox4

## 2022-03-13 NOTE — Progress Notes (Incomplete)
{  Select Note:[id number]} 

## 2022-03-13 NOTE — H&P (Signed)
History and Physical    Gavin Dennis TKP:[id number] DOB: Jun 13, 1950 DOA: 03/12/2022  PCP: Mckinley Jewel, MD (Confirm with patient/family/NH records and if not entered, this has to be entered at [facility], The point of entry) Patient coming from: Home  I have personally briefly reviewed patient's old medical records in [place]  Chief Complaint: Feeling better  HPI: Gavin Dennis is a 71 y.o. male with medical history significant of embolic stroke 5170, left eye vision loss, ascending aortic aneurysm, HTN, H presented with persistent coordination problems.  Symptoms started yesterday morning, patient woke up and soon he found that he started to have coordination issue with both of his hands, he found he could not unscrew a jug and his both hands were clumsy.  He also developed weakness of his hand, when he dropped his fork several times with right hand.  He will also develop some numbness associated with right hand but resolved in a few hours.  In the ED, during ED stay, most of the face neuro symptoms resolved.  He is able to pick up things with right hand and unscored.  Denies any weakness numbness of any other limbs.  Patient had a embolic stroke event March 2022 when he also contracted COVID infection.  He was on aspirin and Plavix for 3 weeks and stayed on aspirin alone for antiplatelet.  TTE on admission showed there is no significant ventricle thrombosis.  He has no history of A-fib either.  But, he described that recently, he has had few isolated episodes of chest pains, which he took several discrete thing 10-05-08, nonradiating, usually lasted about 30 to 60 seconds and resolved by its own.  He did not take nitroglycerin for any of the episodes.  He told me that the frequency of those chest pains less than once a month, and last episode was last week when he was at church, episode lasted about 30 seconds and resolved by its own.  Denies any palpitations, nauseous vomiting or sweating during the  episode.  ED MRI showed acute stroke in the right centrum semiovale extending to the parietal lobe without hemorrhage.  Review of Systems: As per HPI otherwise 14 point review of systems negative.    Past Medical History:  Diagnosis Date   Blurred vision, left eye    since stroke 06/2020   CVA (cerebral vascular accident) ([place]) 06/2020   DDD (degenerative disc disease), lumbar    Hyperlipidemia    Hypertension    Hypomagnesemia    Polio    childhood   Vitamin B 12 deficiency     History reviewed. No pertinent surgical history.   reports that he quit smoking about 2 years ago. His smoking use included cigarettes. He has a 30.00 pack-year smoking history. He has never used smokeless tobacco. He reports that he does not currently use alcohol. He reports that he does not currently use drugs after having used the following drugs: Cocaine.  No Known Allergies  Family History  Problem Relation Age of Onset   Heart Problems Mother    Prostate cancer Father    Lung cancer Brother      Prior to Admission medications   Medication Sig Start Date End Date Taking? Authorizing Provider  amLODipine (NORVASC) 10 MG tablet Take 1 tablet (10 mg total) by mouth daily. 11/15/20 03/06/22  Kerin Perna, NP  aspirin EC 81 MG EC tablet Take 1 tablet (81 mg total) by mouth daily. Swallow whole. 07/11/20   Lattie Haw, MD  atorvastatin (LIPITOR) 40 MG tablet Take 1 tablet (40 mg total) by mouth daily. 08/30/21   Janina Mayo, MD  carvedilol (COREG) 6.25 MG tablet Take 1 tablet (6.25 mg total) by mouth 2 (two) times daily. 03/06/22   Janina Mayo, MD  hydrochlorothiazide (HYDRODIURIL) 25 MG tablet Take 1 tablet (25 mg total) by mouth daily. 10/06/20   Kerin Perna, NP  ibuprofen (ADVIL) 600 MG tablet Take 1 tablet (600 mg total) by mouth every 6 (six) hours as needed. 11/13/21   Wilnette Kales, PA  metFORMIN (GLUCOPHAGE) 500 MG tablet Take 500 mg by mouth daily.    [provider]  vitamin B-12 (CYANOCOBALAMIN) 500 MCG tablet Take 500 mcg by mouth daily.    [provider]    Physical Exam: Vitals:   03/12/22 1835 03/12/22 2207 03/13/22 0138 03/13/22 0746  BP: (!) 127/104 (!) 130/104 (!) 124/102 120/72  Pulse: 78 81 73 60  Resp: 16 20 20 18   Temp: 97.9 F (36.6 C) 97.7 F (36.5 C) 97.7 F (36.5 C) 97.9 F (36.6 C)  TempSrc:  Oral Oral Oral  SpO2: 94% 94% 96% 100%  Height:        Constitutional: NAD, calm, comfortable Vitals:   03/12/22 1835 03/12/22 2207 03/13/22 0138 03/13/22 0746  BP: (!) 127/104 (!) 130/104 (!) 124/102 120/72  Pulse: 78 81 73 60  Resp: 16 20 20 18   Temp: 97.9 F (36.6 C) 97.7 F (36.5 C) 97.7 F (36.5 C) 97.9 F (36.6 C)  TempSrc:  Oral Oral Oral  SpO2: 94% 94% 96% 100%  Height:       Eyes: PERRL, lids and conjunctivae normal ENMT: Mucous membranes are moist. Posterior pharynx clear of any exudate or lesions.Normal dentition.  Neck: normal, supple, no masses, no thyromegaly Respiratory: clear to auscultation bilaterally, no wheezing, no crackles. Normal respiratory effort. No accessory muscle use.  Cardiovascular: Regular rate and rhythm, no murmurs / rubs / gallops. No extremity edema. 2+ pedal pulses. No carotid bruits.  Abdomen: no tenderness, no masses palpated. No hepatosplenomegaly. Bowel sounds positive.  Musculoskeletal: no clubbing / cyanosis. No joint deformity upper and lower extremities. Good ROM, no contractures. Normal muscle tone.  Skin: no rashes, lesions, ulcers. No induration Neurologic: CN 2-12 grossly intact. Sensation intact, DTR normal. Strength 5/5 in all 4.  Psychiatric: Normal judgment and insight. Alert and oriented x 3. Normal mood.     Labs on Admission: I have personally reviewed following labs and imaging studies  CBC: Recent Labs  Lab 03/12/22 1439  WBC 5.1  NEUTROABS 2.5  HGB 14.3  HCT 42.8  MCV 88.1  PLT 952   Basic Metabolic Panel: Recent Labs  Lab 03/06/22 1715  03/12/22 1439 03/13/22 0627  NA 140 139  --   K 3.6 3.0*  --   CL 102 101  --   CO2 21 28  --   GLUCOSE 140* 143*  --   BUN 29* 24*  --   CREATININE 1.27 1.45*  --   CALCIUM 10.9* 10.5*  --   MG  --   --  1.3*   GFR: Estimated Creatinine Clearance: 56.2 mL/min (A) (by C-G formula based on SCr of 1.45 mg/dL (H)). Liver Function Tests: No results for input(s): "AST", "ALT", "ALKPHOS", "BILITOT", "PROT", "ALBUMIN" in the last 168 hours. No results for input(s): "LIPASE", "AMYLASE" in the last 168 hours. No results for input(s): "AMMONIA" in the last 168 hours. Coagulation Profile: No results  for input(s): "INR", "PROTIME" in the last 168 hours. Cardiac Enzymes: No results for input(s): "CKTOTAL", "CKMB", "CKMBINDEX", "TROPONINI" in the last 168 hours. BNP (last 3 results) No results for input(s): "PROBNP" in the last 8760 hours. HbA1C: No results for input(s): "HGBA1C" in the last 72 hours. CBG: No results for input(s): "GLUCAP" in the last 168 hours. Lipid Profile: No results for input(s): "CHOL", "HDL", "LDLCALC", "TRIG", "CHOLHDL", "LDLDIRECT" in the last 72 hours. Thyroid Function Tests: No results for input(s): "TSH", "T4TOTAL", "FREET4", "T3FREE", "THYROIDAB" in the last 72 hours. Anemia Panel: No results for input(s): "VITAMINB12", "FOLATE", "FERRITIN", "TIBC", "IRON", "RETICCTPCT" in the last 72 hours. Urine analysis: No results found for: "COLORURINE", "APPEARANCEUR", "LABSPEC", "PHURINE", "GLUCOSEU", "HGBUR", "BILIRUBINUR", "KETONESUR", "PROTEINUR", "UROBILINOGEN", "NITRITE", "LEUKOCYTESUR"  Radiological Exams on Admission: MR BRAIN WO CONTRAST  Result Date: 03/13/2022 CLINICAL DATA:  Confusion EXAM: MRI HEAD WITHOUT CONTRAST TECHNIQUE: Multiplanar, multiecho pulse sequences of the brain and surrounding structures were obtained without intravenous contrast. COMPARISON:  CT head 1 day prior FINDINGS: Brain: There is diffusion restriction with associated FLAIR signal  abnormality in the right centrum semiovale extending to the parietal lobe cortex with some involvement of the postcentral gyrus consistent with acute infarct. There is no associated hemorrhage or mass effect. There is no other evidence of acute infarct. There is no acute intracranial hemorrhage or extra-axial fluid collection. There is mild background parenchymal volume loss the ventricles are stable in size. There is extensive confluent FLAIR signal abnormality in the supratentorial white matter and pons consistent with advanced chronic small vessel ischemic change. There are small remote infarcts in the bilateral basal gangl,ia thalami, and right cerebellar hemisphere. There are scattered punctate chronic microhemorrhages in the brainstem, thalami, basal ganglia, and occipital lobe, likely hypertensive. There is no mass lesion.  There is no mass effect or midline shift. Vascular: Normal flow voids. Skull and upper cervical spine: Normal marrow signal. Sinuses/Orbits: The paranasal sinuses are clear. The globes and orbits are unremarkable. Other: None. IMPRESSION: 1. 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 2. Small remote lacunar infarcts and background advanced chronic small vessel ischemic change. 3. Scattered punctate chronic microhemorrhages are likely hypertensive in etiology. These results were called by telephone at the time of interpretation on 03/13/2022 at 9:08 am to provider Dr Kathrynn Humble, who verbally acknowledged these results. Electronically Signed   By: Valetta Mole M.D.   On: 03/13/2022 09:08   CT Angio Chest Aorta W and/or Wo Contrast  Result Date: 03/13/2022 CLINICAL DATA:  71 year old male with stroke-like symptoms, loss of coordination, 4.5 cm fusiform ascending thoracic aortic aneurysm on lung cancer screening chest CT in March. EXAM: CT ANGIOGRAPHY CHEST WITH CONTRAST TECHNIQUE: Multidetector CT imaging of the  chest was performed using the standard protocol during bolus administration of intravenous contrast. Multiplanar CT image reconstructions and MIPs were obtained to evaluate the vascular anatomy. RADIATION DOSE REDUCTION: This exam was performed according to the departmental dose-optimization program which includes automated exposure control, adjustment of the mA and/or kV according to patient size and/or use of iterative reconstruction technique. CONTRAST:  95mL OMNIPAQUE IOHEXOL 350 MG/ML SOLN COMPARISON:  Noncontrast chest CT 07/01/2021. FINDINGS: Cardiovascular: Pre contrast and postcontrast CTA images. Satisfactory aortic contrast but dominant pulmonary artery contrast timing. Stable ascending aorta size and configuration. Stable tortuosity of the arch. Calcified aortic atherosclerosis. Negative for thoracic aortic dissection. Mild mixing artifact in the distal visible aorta. Visible proximal abdominal aortic branches appear  to remain patent. No pulmonary artery filling defect identified. Mild cardiomegaly which might be eccentric rated by lower lung volumes today. No pericardial effusion. Mediastinum/Nodes: Generalized increased mediastinal and right hilar lymph nodes since March, with individual nodal tissue now 15-20 mm maximal short axis (subcentimeter in March). No discrete mediastinal mass. No axillary lymphadenopathy. Lungs/Pleura: Centrilobular emphysema. Major airways are patent. Mildly lower lung volumes with increased platelike left lower lobe atelectasis. Platelike opacity also in the right upper lobe along the minor fissure now. Stable small 3-4 mm right lung nodule series 9, image 51 since 07/01/2021. Mild bronchiectasis and architectural distortion in the left upper lobe on series 9, image 81 is stable. No pleural effusion or acute pulmonary inflammation identified. Upper Abdomen: Negative mostly noncontrast visible liver, gallbladder, spleen, pancreas, adrenal glands in the upper abdomen.  Bilateral low-density renal areas with simple fluid density are most likely benign cysts (no follow-up imaging recommended). Visible kidneys are nonobstructed. Decompressed stomach with bulky duodenal and/or proximal small bowel diverticula measuring 5.4 cm on series 7, image 138. This was partially visible in March. Otherwise negative visible bowel. Musculoskeletal: No acute or suspicious osseous lesion identified. Review of the MIP images confirms the above findings. IMPRESSION: 1. Nonspecific new Mediastinal And Right Hilar Lymphadenopathy since March this year. No explanatory underlying pneumonia or pulmonary inflammation is identified. Lymphoproliferative disorder or metastatic disease cannot be excluded. 2. But chronic lung disease with Emphysema (ICD10-J43.9) appears stable from Low-dose Screening Chest CT 07/01/2021 except for new plate-like atelectasis. Recommend repeat low-dose chest CT without contrast (please use the following order, "CT CHEST LCS NODULE FOLLOW-UP W/O CM" ). 3. Negative for thoracic aortic dissection. No evidence of pulmonary embolus stable Ascending thoracic aortic 4.5 cm aneurysm. Ascending thoracic. Aortic aneurysm. Recommend semi-annual imaging followup by CTA or MRA and referral to cardiothoracic surgery if not already obtained. This recommendation follows 2010 ACCF/AHA/AATS/ACR/ASA/SCA/SCAI/SIR/STS/SVM Guidelines for the diagnosis and Management of Patients With Thoracic Aortic Disease. Circulaton. 2010; 121: W102-V253. Aortic aneurysm NOS (ICD10-I71.9). Aortic Atherosclerosis (ICD10-I70.0) Electronically Signed   By: Genevie Ann M.D.   On: 03/13/2022 07:52   DG Chest 2 View  Result Date: 03/13/2022 CLINICAL DATA:  71 year old male with history of stroke-like symptoms. Loss of coordination. EXAM: CHEST - 2 VIEW COMPARISON:  Chest x-ray 06/04/2020. FINDINGS: Linear opacities in the left base and right mid to lower lung have an appearance of platelike atelectasis or chronic scarring  on today's examination, but are new compared to the prior study. No confluent consolidative airspace disease. No pleural effusions. No pneumothorax. No definite suspicious appearing pulmonary nodules or masses are noted. No evidence of pulmonary edema. Heart size is normal. Atherosclerotic calcifications are noted within the tortuous thoracic aorta. IMPRESSION: 1. Increased subsegmental atelectasis and/or scarring in the lower lungs. 2. Aortic atherosclerosis. Electronically Signed   By: Vinnie Langton M.D.   On: 03/13/2022 06:00   CT HEAD WO CONTRAST (5MM)  Result Date: 03/12/2022 CLINICAL DATA:  Delirium.  Confusion. EXAM: CT HEAD WITHOUT CONTRAST TECHNIQUE: Contiguous axial images were obtained from the base of the skull through the vertex without intravenous contrast. RADIATION DOSE REDUCTION: This exam was performed according to the departmental dose-optimization program which includes automated exposure control, adjustment of the mA and/or kV according to patient size and/or use of iterative reconstruction technique. COMPARISON:  CT examination dated July 10, 2020 FINDINGS: Brain: No evidence of acute infarction, hemorrhage, hydrocephalus, extra-axial collection or mass lesion/mass effect. Diffuse low-attenuation of the periventricular and subcortical white matter presumed advanced  chronic microvascular ischemic changes. Mild cerebral atrophy. Vascular: No hyperdense vessel or unexpected calcification. Skull: Normal. Negative for fracture or focal lesion. Sinuses/Orbits: No acute finding. Other: None. IMPRESSION: 1. No acute intracranial abnormality. 2. Advanced chronic microvascular ischemic changes of the white matter and mild cerebral atrophy. Electronically Signed   By: Keane Police D.O.   On: 03/12/2022 16:04    EKG: Independently reviewed.  Sinus, no A-fib  Assessment/Plan Principal Problem:   Stroke (cerebrum) (HCC) Active Problems:   CVA (cerebral vascular accident) ([place])  (please  populate well all problems here in Problem List. (For example, if patient is on BP meds at home and you resume or decide to hold them, it is a problem that needs to be her. Same for CAD, COPD, HLD and so on)  Acute CVA -With acute ataxia of right upper limb which resolved during ED stay. -Etiology unknown.  Patient had embolic stroke last year, and at which time Patient also had COVID infection and probably concurrent hypercoagulable state.  Episode of chest pain also raised suspicion about PAF.  We will continue telemetry monitoring, and likely will need chronic monitoring/event monitor.  In addition, this time, MRI also showed remote lacunar stroke, raised a question about other etiologies such as uncontrolled hypertension. -Will order MRA -Echcardiogram and telemetry monitoring, expect discharge on Zio patch. -Continue aspirin, add Plavix  Chest pains -Atypical, seems not related to activity.  Unclear whether also has concurrent PAF. -Recommend him to follow-up with cardiology to discuss about stress test and Zio patch to rule out PAF.  HTN -Hold off home BP meds, as needed hydralazine to allow permissive hypertension for another 24 hours  HLD -Continue Lipitor, check lipid panel  IIDM -Hold metformin, sliding scale for now.  DVT prophylaxis: Lovenox Dennis Status: Full Dennis Family Communication none at bedside Disposition Plan: Patient with significant recurrent stroke in 2 years, requiring inpatient work-up, expect more than 2 midnight hospital stay. Consults called: Neurology Admission status: Tele admit   Lequita Halt MD [facility] Pager [phone]  03/13/2022, 9:40 AM

## 2022-03-13 NOTE — ED Notes (Signed)
Patient transported to CT 

## 2022-03-13 NOTE — Progress Notes (Signed)
*  PRELIMINARY RESULTS* Echocardiogram 2D Echocardiogram has been performed.  Gavin Dennis 03/13/2022, 1:14 PM

## 2022-03-13 NOTE — Consult Note (Addendum)
Neurology Consultation  Reason for Consult: Stroke on MRI  Referring Physician: Dr. Kathrynn Humble   CC: loss of coordination of left hand   History is obtained from:patient and medical record   HPI: Gavin Dennis is a 71 y.o. male with past medical history of CVA, HTN, HLD,Vit B12 deficiency and polysubstance abuse who presents to hospital for evaluation of not feeling right and the inability to use his left hand. He states he woke up fine, while watching TV he tried to get  The cap off his water bottle and could not and it was even worse trying to get the cap back on. He states this continued through out the day and he had theses symptoms when he last had a stroke so he decided to be evaluated.  He tells me that he still is having some trouble using his left hand, however symptoms are improved a little from yesterday. He denies any numbness, tingling, weakness slurred speech or facial droop.  MRI brain with Acute infarct in the right centrum semiovale. Neurology consulted for stroke workup.   LKW: 03/12/2022 IV thrombolysis given?: no, outside window Premorbid modified Rankin scale (mRS):  0-Completely asymptomatic and back to baseline post-stroke  ROS: Full ROS was performed and is negative except as noted in the HPI.    Past Medical History:  Diagnosis Date   Blurred vision, left eye    since stroke 06/2020   CVA (cerebral vascular accident) ([place]) 06/2020   DDD (degenerative disc disease), lumbar    Hyperlipidemia    Hypertension    Hypomagnesemia    Polio    childhood   Vitamin B 12 deficiency      Family History  Problem Relation Age of Onset   Heart Problems Mother    Prostate cancer Father    Lung cancer Brother      Social History:   reports that he quit smoking about 2 years ago. His smoking use included cigarettes. He has a 30.00 pack-year smoking history. He has never used smokeless tobacco. He reports that he does not currently use alcohol. He reports that he does  not currently use drugs after having used the following drugs: Cocaine.  Medications  Current Facility-Administered Medications:    [START ON 03/14/2022]  stroke: early stages of recovery book, , Does not apply, Once, Wynetta Fines T, MD   acetaminophen (TYLENOL) tablet 650 mg, 650 mg, Oral, Q4H PRN **OR** acetaminophen (TYLENOL) 160 MG/5ML solution 650 mg, 650 mg, Per Tube, Q4H PRN **OR** acetaminophen (TYLENOL) suppository 650 mg, 650 mg, Rectal, Q4H PRN, Wynetta Fines T, MD   aspirin EC tablet 81 mg, 81 mg, Oral, Daily, [place], Ping T, MD, 81 mg at 03/13/22 1101   atorvastatin (LIPITOR) tablet 40 mg, 40 mg, Oral, Daily, [place], Ping T, MD, 40 mg at 03/13/22 1101   clopidogrel (PLAVIX) tablet 75 mg, 75 mg, Oral, Daily, Wynetta Fines T, MD, 75 mg at 03/13/22 1101   enoxaparin (LOVENOX) injection 40 mg, 40 mg, Subcutaneous, Q24H, Wynetta Fines T, MD, 40 mg at 03/13/22 1100   hydrALAZINE (APRESOLINE) tablet 25 mg, 25 mg, Oral, Q6H PRN, Wynetta Fines T, MD   insulin aspart (novoLOG) injection 0-9 Units, 0-9 Units, Subcutaneous, TID WC, Zhang, Ping T, MD   LORazepam (ATIVAN) injection 0.5 mg, 0.5 mg, Intravenous, Q6H PRN, Wynetta Fines T, MD   potassium chloride 10 mEq in 100 mL IVPB, 10 mEq, Intravenous, Q1 Hr x 2, Wynetta Fines T, MD, Last Rate: 100 mL/hr at 03/13/22  1100, 10 mEq at 03/13/22 1100   senna-docusate (Senokot-S) tablet 1 tablet, 1 tablet, Oral, QHS PRN, Lequita Halt, MD  Current Outpatient Medications:    amLODipine (NORVASC) 10 MG tablet, Take 1 tablet (10 mg total) by mouth daily., Disp: 90 tablet, Rfl: 0   aspirin EC 81 MG EC tablet, Take 1 tablet (81 mg total) by mouth daily. Swallow whole., Disp: 30 tablet, Rfl: 0   atorvastatin (LIPITOR) 40 MG tablet, Take 1 tablet (40 mg total) by mouth daily., Disp: 90 tablet, Rfl: 3   carvedilol (COREG) 6.25 MG tablet, Take 1 tablet (6.25 mg total) by mouth 2 (two) times daily., Disp: 60 tablet, Rfl: 5   hydrochlorothiazide (HYDRODIURIL) 25 MG tablet,  Take 1 tablet (25 mg total) by mouth daily., Disp: 90 tablet, Rfl: 3   metFORMIN (GLUCOPHAGE) 500 MG tablet, Take 500 mg by mouth daily., Disp: , Rfl:    vitamin B-12 (CYANOCOBALAMIN) 500 MCG tablet, Take 500 mcg by mouth daily., Disp: , Rfl:    ibuprofen (ADVIL) 600 MG tablet, Take 1 tablet (600 mg total) by mouth every 6 (six) hours as needed. (Patient not taking: Reported on 03/13/2022), Disp: 30 tablet, Rfl: 0   Exam: Current vital signs: BP (!) 124/92   Pulse 79   Temp 98.4 F (36.9 C) (Oral)   Resp 20   Ht _0  (1.803 m)   SpO2 96%   BMI 29.68 kg/m  Vital signs in last 24 hours: Temp:  [97.7 F (36.5 C)-98.4 F (36.9 C)] 98.4 F (36.9 C) (11/13 1122) Pulse Rate:  [60-91] 79 (11/13 1122) Resp:  [16-20] 20 (11/13 1122) BP: (120-152)/(72-106) 124/92 (11/13 1122) SpO2:  [94 %-100 %] 96 % (11/13 1122)  GENERAL: Awake, alert in NAD HEENT: - Normocephalic and atraumatic, dry mm LUNGS - Clear to auscultation bilaterally with no wheezes CV - S1S2 RRR, no m/r/g, equal pulses bilaterally. ABDOMEN - Soft, nontender, nondistended with normoactive BS Ext: warm, well perfused, intact peripheral pulses, no edema  NEURO:  Mental Status: AA&Ox4 Language: speech is clear.  Naming, repetition, fluency, and comprehension intact. Cranial Nerves: PERRL EOMI, visual fields full, no facial asymmetry, facial sensation intact, hearing intact, tongue/uvula/soft palate midline, normal sternocleidomastoid and trapezius muscle strength. No evidence of tongue atrophy or fibrillations Motor: 5/5 in all 4 extremities  Tone: is normal and bulk is normal Sensation- Intact to light touch bilaterally Coordination: ataxia left hand  Gait- deferred  NIHSS 1a Level of Conscious.: 0 1b LOC Questions: 0 1c LOC Commands: 0 2 Best Gaze: 0 3 Visual: 0 4 Facial Palsy: 0 5a Motor Arm - left: 0 5b Motor Arm - Right: 0 6a Motor Leg - Left: 0 6b Motor Leg - Right: 0 7 Limb Ataxia: 1 8 Sensory: 0 9 Best  Language: 0 10 Dysarthria: 0 11 Extinct. and Inatten.: 0 TOTAL: 1   Labs I have reviewed labs in epic and the results pertinent to this consultation are: 0 CBC    Component Value Date/Time   WBC 5.1 03/12/2022 1439   RBC 4.86 03/12/2022 1439   HGB 14.3 03/12/2022 1439   HCT 42.8 03/12/2022 1439   PLT 268 03/12/2022 1439   MCV 88.1 03/12/2022 1439   MCH 29.4 03/12/2022 1439   MCHC 33.4 03/12/2022 1439   RDW 14.8 03/12/2022 1439   LYMPHSABS 1.6 03/12/2022 1439   MONOABS 0.6 03/12/2022 1439   EOSABS 0.3 03/12/2022 1439   BASOSABS 0.0 03/12/2022 1439    CMP  Component Value Date/Time   NA 139 03/12/2022 1439   NA 140 03/06/2022 1715   K 3.0 (L) 03/12/2022 1439   CL 101 03/12/2022 1439   CO2 28 03/12/2022 1439   GLUCOSE 143 (H) 03/12/2022 1439   BUN 24 (H) 03/12/2022 1439   BUN 29 (H) 03/06/2022 1715   CREATININE 1.45 (H) 03/12/2022 1439   CALCIUM 10.5 (H) 03/12/2022 1439   PROT 7.7 01/19/2022 1220   ALBUMIN 4.1 01/19/2022 1220   AST 17 01/19/2022 1220   ALT 19 01/19/2022 1220   ALKPHOS 145 (H) 01/19/2022 1220   BILITOT 1.2 01/19/2022 1220   GFRNONAA 52 (L) 03/12/2022 1439   GFRAA  09/22/2010 0448    >60        The eGFR has been calculated using the MDRD equation. This calculation has not been validated in all clinical situations. eGFR's persistently <60 mL/min signify possible Chronic Kidney Disease.    Lipid Panel     Component Value Date/Time   CHOL 135 07/10/2020 0314   TRIG 106 07/10/2020 0314   HDL 39 (L) 07/10/2020 0314   CHOLHDL 3.5 07/10/2020 0314   VLDL 21 07/10/2020 0314   LDLCALC 75 07/10/2020 0314   LDLDIRECT 29 01/19/2022 1220     Imaging I have reviewed the images obtained:  MRI examination of the brain 1. 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 2. Small remote lacunar infarcts and background advanced chronic small vessel ischemic change. 3.  Scattered punctate chronic microhemorrhages are likely hypertensive in etiology.   MRA head  No intracranial large vessel occlusion or proximal high-grade arterial stenosis.  MRA neck  1. Non-contrast technique limits evaluation of the proximal common carotid arteries. Within this limitation, the common carotid and internal carotid arteries are patent within the neck without hemodynamically significant stenosis. 2. Motion degradation and non-contrast technique precludes evaluation of the left vertebral artery V1 segment, and the right vertebral artery proximal V1 segment. Additionally, non-contrast technique precludes evaluation of the bilateral vertebral artery V3 segments. Elsewhere, the vertebral arteries are patent within the neck without appreciable hemodynamically significant stenosis.  Assessment:  Gavin Dennis is a 71 y.o. male with past medical history of CVA, HTN, HLD,Vit B12 deficiency who presents to hospital for evaluation of not feeling right and the inability to use his left hand. He states he woke up fine, while watching TV he tried to get  The cap off his water bottle and could not and it was even worse trying to get the cap back on. He states this continued through out the day and he had theses symptoms when he last had a stroke so he decided to be evaluated.   Acute ischemic infarct in the right centrum semiovale extending to parietal lobe-etiology under investigation-strong suspicion for atrial fibrillation given chest pain that is intermittent and appearance and location of the stroke.  Recommendations: - HgbA1c, fasting lipid panel - Frequent neuro checks - Echocardiogram - US carotid bilateral - Prophylactic therapy-Antiplatelet med: Aspirin - dose 1m and plavix 764mdaily  - Risk factor modification - Telemetry monitoring - PT consult, OT consult, Speech consult - Stroke team to follow  DeBeulah GandyNP, [place]  Attending Neurohospitalist Addendum Patient  seen and examined with APP/Resident. Agree with the history and physical as documented above. Agree with the plan as documented, which I helped formulate. I have independently reviewed the chart, obtained history, review of systems and  examined the patient.I have personally reviewed pertinent head/neck/spine imaging (CT/MRI). Please feel free to call with any questions.  -- Amie Portland, MD Neurologist [facility] Pager: [phone]

## 2022-03-13 NOTE — Progress Notes (Signed)
Carotid duplex has been completed.    Results can be found under chart review under CV PROC. 03/13/2022 2:52 PM Emree Locicero RVT, RDMS

## 2022-03-14 ENCOUNTER — Inpatient Hospital Stay (HOSPITAL_COMMUNITY): Payer: Medicare HMO

## 2022-03-14 DIAGNOSIS — Z8673 Personal history of transient ischemic attack (TIA), and cerebral infarction without residual deficits: Secondary | ICD-10-CM | POA: Diagnosis not present

## 2022-03-14 DIAGNOSIS — I63411 Cerebral infarction due to embolism of right middle cerebral artery: Secondary | ICD-10-CM | POA: Diagnosis not present

## 2022-03-14 DIAGNOSIS — I639 Cerebral infarction, unspecified: Secondary | ICD-10-CM | POA: Diagnosis not present

## 2022-03-14 DIAGNOSIS — R590 Localized enlarged lymph nodes: Secondary | ICD-10-CM

## 2022-03-14 DIAGNOSIS — I10 Essential (primary) hypertension: Secondary | ICD-10-CM

## 2022-03-14 LAB — GLUCOSE, CAPILLARY
Glucose-Capillary: 97 mg/dL (ref 70–99)
Glucose-Capillary: 116 mg/dL — ABNORMAL HIGH (ref 70–99)
Glucose-Capillary: 150 mg/dL — ABNORMAL HIGH (ref 70–99)
Glucose-Capillary: 101 mg/dL — ABNORMAL HIGH (ref 70–99)

## 2022-03-14 LAB — CBC
HCT: 38.4 % — ABNORMAL LOW (ref 39.0–52.0)
Hemoglobin: 12.9 g/dL — ABNORMAL LOW (ref 13.0–17.0)
MCH: 29.7 pg (ref 26.0–34.0)
MCHC: 33.6 g/dL (ref 30.0–36.0)
MCV: 88.3 fL (ref 80.0–100.0)
Platelets: 216 10*3/uL (ref 150–400)
RBC: 4.35 MIL/uL (ref 4.22–5.81)
RDW: 14.6 % (ref 11.5–15.5)
WBC: 4.2 10*3/uL (ref 4.0–10.5)
nRBC: 0 % (ref 0.0–0.2)

## 2022-03-14 LAB — BASIC METABOLIC PANEL
Anion gap: 7 (ref 5–15)
BUN: 22 mg/dL (ref 8–23)
CO2: 28 mmol/L (ref 22–32)
Calcium: 9.8 mg/dL (ref 8.9–10.3)
Chloride: 103 mmol/L (ref 98–111)
Creatinine, Ser: 1.18 mg/dL (ref 0.61–1.24)
GFR, Estimated: >60 mL/min (ref >60)
Glucose, Bld: 114 mg/dL — ABNORMAL HIGH (ref 70–99)
Potassium: 3.2 mmol/L — ABNORMAL LOW (ref 3.5–5.1)
Sodium: 138 mmol/L (ref 135–145)

## 2022-03-14 LAB — LIPID PANEL
Cholesterol: 66 mg/dL (ref 0–200)
HDL: 23 mg/dL — ABNORMAL LOW (ref >40)
LDL Cholesterol: 33 mg/dL (ref 0–99)
Total CHOL/HDL Ratio: 2.9 RATIO
Triglycerides: 48 mg/dL (ref <150)
VLDL: 10 mg/dL (ref 0–40)

## 2022-03-14 LAB — RAPID URINE DRUG SCREEN, HOSP PERFORMED
Amphetamines: NOT DETECTED
Barbiturates: NOT DETECTED
Benzodiazepines: NOT DETECTED
Cocaine: NOT DETECTED
Opiates: NOT DETECTED
Tetrahydrocannabinol: NOT DETECTED

## 2022-03-14 LAB — MAGNESIUM: Magnesium: 1.4 mg/dL — ABNORMAL LOW (ref 1.7–2.4)

## 2022-03-14 MED ORDER — STUDY - OCEANIC-STROKE - ASUNDEXIAN 50 MG OR PLACEBO TABLET (PI-SETHI)
1.0000 | ORAL_TABLET | Freq: Every day | ORAL | Status: DC
  Administered 2022-03-14 – 2022-03-15 (×2): 50 mg via ORAL
  Filled 2022-03-14 (×2): qty 1

## 2022-03-14 MED ORDER — MAGNESIUM SULFATE 4 GM/100ML IV SOLN
4.0000 g | Freq: Once | INTRAVENOUS | Status: AC
  Administered 2022-03-14: 4 g via INTRAVENOUS
  Filled 2022-03-14: qty 100

## 2022-03-14 MED ORDER — POTASSIUM CHLORIDE CRYS ER 20 MEQ PO TBCR
40.0000 meq | EXTENDED_RELEASE_TABLET | Freq: Once | ORAL | Status: AC
  Administered 2022-03-14: 40 meq via ORAL
  Filled 2022-03-14: qty 2

## 2022-03-14 NOTE — Progress Notes (Signed)
Mobility Specialist: Progress Note   03/14/22 1656  Mobility  Activity Ambulated with assistance in hallway  Level of Assistance Contact guard assist, steadying assist  Assistive Device None  Distance Ambulated (ft) 140 ft  Activity Response Tolerated well  Mobility Referral Yes  $Mobility charge 1 Mobility   Pt received in the bed and agreeable to mobility. C/o L knee pain during ambulation, otherwise asymptomatic. Increased lateral sway during ambulation d/t L knee pain. Pt back to bed after session with call bell and phone in reach.   Denning Shambhavi Salley Mobility Specialist Please contact via SecureChat or Rehab office at [phone]

## 2022-03-14 NOTE — Progress Notes (Signed)
[facility] New Study Start: OCEANIC-STROKE   SUMMARY For more information refer to: [email or web address]. Study Identifier: [id number]    A Multicenter, International, Randomized, Placebo Controlled, Double-blind, Parallel Group and Event Driven Phase 3 Study of the Oral FXIa Inhibitor Asundexian ([id number]) for the Prevention of Ischemic Stroke in Male and Male Participants Aged 70 Years and Older After an Acute Non-cardioembolic Ischemic Stroke or High-risk TIA  Brief Summary This study will randomize adult patients with an initial diagnosis of acute non-cardioembolic ischemic stroke or high-risk transient ischemic attack (TIA) to treatment within 72 hours of symptom onset and with the intention to be treated with antiplatelet therapy.  Design Phase 3, Randomized, Interventional, Event-Driven  Intervention Asundexian ([id number]) 50 mg tablets or matching placebo tablets (pink, oval, film-coated tablets  Concomitant Therapy Participants are to receive antiplatelet therapy (single or dual; provider discretion) during the study conduct.  Prohibited Therapy Oral anticoagulation Full dose and/or long-term anticoagulation therapy with heparin or LMWH  Chronic (more than 4 weeks continuous) therapy with NSAIDs during the study conduct Concomitant use of combined P-gp and strong/moderate CYP3A4 inducers Concomitant use of combined P-gp and strong CYP3A4 inhibitors Herbal/traditional medications and/or supplements with known anticoagulant/antiplatelet effects  Anticoagulation Prophylaxis Venous thromboembolism prophylaxis with LMWH or unfractionated heparin for short periods of time (2 weeks) is allowed.  Potential Drug-Drug Interactions Rosuvastatin 20 mg daily or higher or atorvastatin 80 mg (additional monitoring may be warranted due to increased concentrations of rosuvastatin and atorvastatin - asundexian ([id number]) is a weak inhibitor of CYP2C8)  Administration   Can be taken irrespective of food intake. Should be swallowed whole with water, preferably in the morning. CANNOT be crushed or broken.      Plan: Start Cecille Rubin [phone]) 50 mg tablets or placebo] tonight. Study medication must be picked up from pharmacy, medication can not be tubed.    Please contact [facility] if any questions or concerns regarding the study medication.    Acey Lav, PharmD, [facility] Pharmacist  [phone]

## 2022-03-14 NOTE — Consult Note (Signed)
ELECTROPHYSIOLOGY CONSULT NOTE  Patient ID: Gavin Dennis MRN: [id number], DOB/AGE: [birth year]   Admit date: 03/12/2022 Date of Consult: 03/15/2022  Primary Physician: Mckinley Jewel, MD Primary Cardiologist: Janina Mayo, MD  Primary Electrophysiologist: New to Dr. Myles Gip Reason for Consultation: Cryptogenic stroke; recommendations regarding [place]: [employer]  History of Present Illness EP has been asked to evaluate [name] for placement of an implantable loop recorder to monitor for atrial fibrillation by Dr Erlinda Hong.  The patient was admitted on 03/12/2022 with confusion and inability to use his left hand.    Imaging demonstrated acute infarct in the right centrum semiovale extending to the parietal lobe cortex as well as small remote lacunar infarcts.    He has undergone workup for stroke:  MRI Brain - Acute infarct in the right centrum semiovale extending to the parietal lobe cortex without hemorrhage or mass effect, Small remote lacunar infarcts and background advanced chronic small vessel ischemic change, Scattered punctate chronic microhemorrhages are likely hypertensive in etiology. MRA head - No intracranial large vessel occlusion or proximal high-grade arterial stenosis. MRA neck - limited but the common carotid and internal carotid arteries are patent within the neck without hemodynamically significant stenosis within these limitations. 2. Motion degradation and non-contrast technique precludes evaluation of the left vertebral artery V1 segment, and the right vertebral artery proximal V1 segment. Additionally, non-contrast technique precludes evaluation of the bilateral vertebral artery V3 segments. Elsewhere, the vertebral arteries are patent within the neck without appreciable hemodynamically significant stenosis. HgbA1c - 6.7  Echo 03/13/22 -> EF 60-65%, mild LAE, no thrombus US carotid bilateral 03/13/22 - Near normal bilaterally -  Prophylactic therapy-Antiplatelet med: Aspirin - dose 81mg  and plavix 75mg  daily  - Telemetry - reviewed with no atrial fibrillation  - PT consult, OT consult, Speech consult  The patient has been monitored on telemetry which has demonstrated sinus rhythm with no arrhythmias.  Inpatient stroke work-up will not require a TEE per Neurology.   Echocardiogram as above. Lab work is reviewed.  Prior to admission, the patient denies chest pain, shortness of breath, dizziness, palpitations, or syncope.  He is recovering from his stroke with plans to return home  at discharge.  Past Medical History:  Diagnosis Date   Blurred vision, left eye    since stroke 70/2022   CVA (cerebral vascular accident) ([place]) 06/2020   DDD (degenerative disc disease), lumbar    Hyperlipidemia    Hypertension    Hypomagnesemia    Polio    childhood   Vitamin B 12 deficiency      Surgical History: History reviewed. No pertinent surgical history.   Medications Prior to Admission  Medication Sig Dispense Refill Last Dose   amLODipine (NORVASC) 10 MG tablet Take 1 tablet (10 mg total) by mouth daily. 90 tablet 0 03/12/2022   aspirin EC 81 MG EC tablet Take 1 tablet (81 mg total) by mouth daily. Swallow whole. 30 tablet 0 03/12/2022   atorvastatin (LIPITOR) 40 MG tablet Take 1 tablet (40 mg total) by mouth daily. 90 tablet 3 03/12/2022   carvedilol (COREG) 6.25 MG tablet Take 1 tablet (6.25 mg total) by mouth 2 (two) times daily. 60 tablet 5 03/12/2022 at 0800   hydrochlorothiazide (HYDRODIURIL) 25 MG tablet Take 1 tablet (25 mg total) by mouth daily. 90 tablet 3 03/12/2022   metFORMIN (GLUCOPHAGE) 500 MG tablet Take 500 mg by mouth daily.   03/12/2022   vitamin B-12 (CYANOCOBALAMIN) 500 MCG tablet Take  500 mcg by mouth daily.   03/12/2022   ibuprofen (ADVIL) 600 MG tablet Take 1 tablet (600 mg total) by mouth every 6 (six) hours as needed. (Patient not taking: Reported on 03/13/2022) 30 tablet 0 Completed Course     Inpatient Medications:   aspirin EC  81 mg Oral Daily   atorvastatin  40 mg Oral Daily   clopidogrel  75 mg Oral Daily   insulin aspart  0-9 Units Subcutaneous TID WC   OCEANIC-STROKE asundexian or placebo  1 tablet Oral Daily    Allergies: No Known Allergies  Social History   Socioeconomic History   Marital status: Single    Spouse name: Not on file   Number of children: 4   Years of education: 6   Highest education level: 6th grade  Occupational History    Comment: retired  Tobacco Use   Smoking status: Former    Packs/day: 1.00    Years: 30.00    Total pack years: 30.00    Types: Cigarettes    Quit date: 05/01/2068    Years since quitting: 2.8   Smokeless tobacco: Never   Tobacco comments:    quit a mo ago  Substance and Sexual Activity   Alcohol use: Not Currently    Comment: quit a mo ago   Drug use: Not Currently    Types: Cocaine    Comment: hx of abuse   Sexual activity: Not on file  Other Topics Concern   Not on file  Social History Narrative   02/02/21 lives alone   Very little caffeine   Social Determinants of Health   Financial Resource Strain: High Risk (07/29/2021)   Overall Financial Resource Strain (CARDIA)    Difficulty of Paying Living Expenses: Hard  Food Insecurity: Food Insecurity Present (07/29/2021)   Hunger Vital Sign    Worried About Running Out of Food in the Last Year: Sometimes true    Ran Out of Food in the Last Year: Sometimes true  Transportation Needs: No Transportation Needs (07/29/2021)   PRAPARE - Hydrologist (Medical): No    Lack of Transportation (Non-Medical): No  Physical Activity: Insufficiently Active (07/29/2021)   Exercise Vital Sign    Days of Exercise per Week: 7 days    Minutes of Exercise per Session: 10 min  Stress: No Stress Concern Present (07/29/2021)   [place]    Feeling of Stress : Not at all  Social  Connections: Moderately Integrated (07/29/2021)   Social Connection and Isolation Panel [NHANES]    Frequency of Communication with Friends and Family: More than three times a week    Frequency of Social Gatherings with Friends and Family: More than three times a week    Attends Religious Services: More than 4 times per year    Active Member of [employer] or Organizations: Yes    Attends Music therapist: More than 4 times per year    Marital Status: Never married  Human resources officer Violence: Not on file     Family History  Problem Relation Age of Onset   Heart Problems Mother    Prostate cancer Father    Lung cancer Brother       Review of Systems: All other systems reviewed and are otherwise negative except as noted above.  Physical Exam: Vitals:   03/14/22 1117 03/14/22 1519 03/14/22 2020 03/15/22 0315  BP: 109/88 118/76 121/89 132/89  Pulse: 64  75 68 74  Resp: 16 18 16 16   Temp: 97.6 F (36.4 C) 97.9 F (36.6 C) 98 F (36.7 C)   TempSrc:   Oral   SpO2: 97% 98% 100% 94%  Height:        GEN- The patient is well appearing, alert and oriented x 3 today.   Head- normocephalic, atraumatic Eyes-  Sclera clear, conjunctiva pink Ears- hearing intact Oropharynx- clear Neck- supple Lungs- Clear to ausculation bilaterally, normal work of breathing Heart- Regular rate and rhythm, no murmurs, rubs or gallops  GI- soft, NT, ND, + BS Extremities- no clubbing, cyanosis, or edema MS- no significant deformity or atrophy Skin- no rash or lesion Psych- euthymic mood, full affect Neuro- unsteady gait, left sided deficits remain   Labs:   Lab Results  Component Value Date   WBC 4.2 03/14/2022   HGB 12.9 (L) 03/14/2022   HCT 38.4 (L) 03/14/2022   MCV 88.3 03/14/2022   PLT 216 03/14/2022    Recent Labs  Lab 03/15/22 0636  NA 137  K 3.6  CL 104  CO2 27  BUN 19  CREATININE 1.07  CALCIUM 9.4  GLUCOSE 108*     Radiology/Studies: VAS [place] LOWER EXTREMITY VENOUS  (DVT)  Result Date: 03/14/2022  Lower Venous DVT Study Patient Name:  ARBIE BLANKLEY  Date of Exam:   03/14/2022 Medical Rec #: [id number]     Accession #:    [id number] Date of Birth: [birth year]     Patient Gender: M Patient Age:   28 years Exam Location:  [facility] Procedure:      VAS [place] LOWER EXTREMITY VENOUS (DVT) Referring Phys: Cornelius Moras XU --------------------------------------------------------------------------------  Indications: Stroke.  Risk Factors: None identified. Performing Technologist: Oliver Hum RVT  Examination Guidelines: A complete evaluation includes B-mode imaging, spectral Doppler, color Doppler, and power Doppler as needed of all accessible portions of each vessel. Bilateral testing is considered an integral part of a complete examination. Limited examinations for reoccurring indications may be performed as noted. The reflux portion of the exam is performed with the patient in reverse Trendelenburg.  +---------+---------------+---------+-----------+----------+-------------------+ RIGHT    CompressibilityPhasicitySpontaneityPropertiesThrombus Aging      +---------+---------------+---------+-----------+----------+-------------------+ CFV      Full           Yes      Yes                                      +---------+---------------+---------+-----------+----------+-------------------+ SFJ      Full                                                             +---------+---------------+---------+-----------+----------+-------------------+ FV Prox  Full                                                             +---------+---------------+---------+-----------+----------+-------------------+ FV Mid   Full                                                             +---------+---------------+---------+-----------+----------+-------------------+  FV DistalFull                                                              +---------+---------------+---------+-----------+----------+-------------------+ PFV      Full                                                             +---------+---------------+---------+-----------+----------+-------------------+ POP      Full           Yes      Yes                                      +---------+---------------+---------+-----------+----------+-------------------+ PTV      Full                                                             +---------+---------------+---------+-----------+----------+-------------------+ PERO                                                  Not well visualized +---------+---------------+---------+-----------+----------+-------------------+   +---------+---------------+---------+-----------+----------+--------------+ LEFT     CompressibilityPhasicitySpontaneityPropertiesThrombus Aging +---------+---------------+---------+-----------+----------+--------------+ CFV      Full           Yes      Yes                                 +---------+---------------+---------+-----------+----------+--------------+ SFJ      Full                                                        +---------+---------------+---------+-----------+----------+--------------+ FV Prox  Full                                                        +---------+---------------+---------+-----------+----------+--------------+ FV Mid   Full                                                        +---------+---------------+---------+-----------+----------+--------------+ FV DistalFull                                                        +---------+---------------+---------+-----------+----------+--------------+  PFV      Full                                                        +---------+---------------+---------+-----------+----------+--------------+ POP      Full           Yes      Yes                                  +---------+---------------+---------+-----------+----------+--------------+ PTV      Full                                                        +---------+---------------+---------+-----------+----------+--------------+ PERO     Full                                                        +---------+---------------+---------+-----------+----------+--------------+     Summary: RIGHT: - There is no evidence of deep vein thrombosis in the lower extremity. However, portions of this examination were limited- see technologist comments above.  - No cystic structure found in the popliteal fossa.  LEFT: - There is no evidence of deep vein thrombosis in the lower extremity. However, portions of this examination were limited- see technologist comments above.  - No cystic structure found in the popliteal fossa.  *See table(s) above for measurements and observations. Electronically signed by Deitra Mayo MD on 03/14/2022 at 11:13:08 PM.    Final    VAS US CAROTID  Result Date: 03/14/2022 Carotid Arterial Duplex Study Patient Name:  ALFONSE GARRINGER  Date of Exam:   03/13/2022 Medical Rec #: [id number]     Accession #:    [id number] Date of Birth: 25-Mar-1951     Patient Gender: M Patient Age:   18 years Exam Location:  [facility] Procedure:      VAS US CAROTID Referring Phys: Amie [place] --------------------------------------------------------------------------------  Risk Factors:      Hypertension, hyperlipidemia, Diabetes, past history of                    smoking, prior CVA. Comparison Study:  No previous Performing Technologist: Jody Hill RVT, RDMS  Examination Guidelines: A complete evaluation includes B-mode imaging, spectral Doppler, color Doppler, and power Doppler as needed of all accessible portions of each vessel. Bilateral testing is considered an integral part of a complete examination. Limited examinations for reoccurring indications may be performed as noted.  Right Carotid Findings:  +----------+--------+--------+--------+------------------+------------------+           PSV cm/sEDV cm/sStenosisPlaque DescriptionComments           +----------+--------+--------+--------+------------------+------------------+ CCA Prox  48      11                                intimal thickening +----------+--------+--------+--------+------------------+------------------+ CCA Distal37      12  intimal thickening +----------+--------+--------+--------+------------------+------------------+ ICA Prox  32      13                                                   +----------+--------+--------+--------+------------------+------------------+ ICA Distal36      17                                                   +----------+--------+--------+--------+------------------+------------------+ ECA       46      9                                                    +----------+--------+--------+--------+------------------+------------------+ +----------+--------+-------+----------------+-------------------+           PSV cm/sEDV cmsDescribe        Arm Pressure (mmHG) +----------+--------+-------+----------------+-------------------+ [id number]             Multiphasic, WNL                    +----------+--------+-------+----------------+-------------------+ +---------+--------+--+--------+-+---------+ VertebralPSV cm/s21EDV cm/s6Antegrade +---------+--------+--+--------+-+---------+  Left Carotid Findings: +----------+--------+--------+--------+------------------+------------------+           PSV cm/sEDV cm/sStenosisPlaque DescriptionComments           +----------+--------+--------+--------+------------------+------------------+ CCA Prox  61      14                                                   +----------+--------+--------+--------+------------------+------------------+ CCA Distal36      11                                 intimal thickening +----------+--------+--------+--------+------------------+------------------+ ICA Prox  29      10                                                   +----------+--------+--------+--------+------------------+------------------+ ICA Distal39      17                                                   +----------+--------+--------+--------+------------------+------------------+ ECA       44      7                                                    +----------+--------+--------+--------+------------------+------------------+ +----------+--------+--------+----------------+-------------------+           PSV cm/sEDV cm/sDescribe        Arm Pressure (mmHG) +----------+--------+--------+----------------+-------------------+ [id number]  Multiphasic, WNL                    +----------+--------+--------+----------------+-------------------+ +---------+--------+--+--------+-+---------+ VertebralPSV cm/s18EDV cm/s4Antegrade +---------+--------+--+--------+-+---------+   Summary: Right Carotid: The extracranial vessels were near-normal with only minimal wall                thickening or plaque. Left Carotid: The extracranial vessels were near-normal with only minimal wall               thickening or plaque. Vertebrals:  Bilateral vertebral arteries demonstrate antegrade flow. Subclavians: Normal flow hemodynamics were seen in bilateral subclavian              arteries. *See table(s) above for measurements and observations.  Electronically signed by Antony Contras MD on 03/14/2022 at 12:28:04 PM.    Final    ECHOCARDIOGRAM COMPLETE  Result Date: 03/13/2022    ECHOCARDIOGRAM REPORT   Patient Name:   Tadarius Maland Date of Exam: 03/13/2022 Medical Rec #:  [id number]    Height:       71.0 in Accession #:    [id number]   Weight:       212.8 lb Date of Birth:  09-28-1950    BSA:          2.165 m Patient Age:    89 years     BP:           120/72 mmHg Patient  Gender: M            HR:           60 bpm. Exam Location:  [facility] Procedure: 2D Echo, Cardiac Doppler and Color Doppler Indications:    Stroke I63.9  History:        Patient has prior history of Echocardiogram examinations, most                 recent 07/09/2020. Stroke; Risk Factors:Hypertension,                 Dyslipidemia and Diabetes.  Sonographer:    Alvino Chapel RCS Referring Phys: Lequita Halt IMPRESSIONS  1. Left ventricular ejection fraction, by estimation, is 60 to 65%. The left ventricle has normal function. The left ventricle has no regional wall motion abnormalities. There is mild left ventricular hypertrophy. Left ventricular diastolic parameters were normal.  2. Right ventricular systolic function is normal. The right ventricular size is normal. There is mildly elevated pulmonary artery systolic pressure.  3. Left atrial size was mildly dilated.  4. The mitral valve is abnormal. Trivial mitral valve regurgitation. No evidence of mitral stenosis.  5. The aortic valve is tricuspid. There is mild calcification of the aortic valve. There is mild thickening of the aortic valve. Aortic valve regurgitation is not visualized. Aortic valve sclerosis is present, with no evidence of aortic valve stenosis.  6. Aortic dilatation noted. There is moderate dilatation of the ascending aorta, measuring 40 mm.  7. The inferior vena cava is normal in size with greater than 50% respiratory variability, suggesting right atrial pressure of 3 mmHg. FINDINGS  Left Ventricle: Left ventricular ejection fraction, by estimation, is 60 to 65%. The left ventricle has normal function. The left ventricle has no regional wall motion abnormalities. The left ventricular internal cavity size was normal in size. There is  mild left ventricular hypertrophy. Left ventricular diastolic parameters were normal. Right Ventricle: The right ventricular size is normal. No increase in right ventricular wall thickness. Right ventricular  systolic function  is normal. There is mildly elevated pulmonary artery systolic pressure. The tricuspid regurgitant velocity is 2.89  m/s, and with an assumed right atrial pressure of 3 mmHg, the estimated right ventricular systolic pressure is 20.9 mmHg. Left Atrium: Left atrial size was mildly dilated. Right Atrium: Right atrial size was normal in size. Pericardium: There is no evidence of pericardial effusion. Mitral Valve: The mitral valve is abnormal. There is mild thickening of the mitral valve leaflet(s). Trivial mitral valve regurgitation. No evidence of mitral valve stenosis. Tricuspid Valve: The tricuspid valve is normal in structure. Tricuspid valve regurgitation is mild . No evidence of tricuspid stenosis. Aortic Valve: The aortic valve is tricuspid. There is mild calcification of the aortic valve. There is mild thickening of the aortic valve. Aortic valve regurgitation is not visualized. Aortic valve sclerosis is present, with no evidence of aortic valve stenosis. Pulmonic Valve: The pulmonic valve was normal in structure. Pulmonic valve regurgitation is not visualized. No evidence of pulmonic stenosis. Aorta: Aortic dilatation noted. There is moderate dilatation of the ascending aorta, measuring 40 mm. Venous: The inferior vena cava is normal in size with greater than 50% respiratory variability, suggesting right atrial pressure of 3 mmHg. IAS/Shunts: No atrial level shunt detected by color flow Doppler.  LEFT VENTRICLE PLAX 2D LVIDd:         4.50 cm   Diastology LVIDs:         2.70 cm   LV e' medial:    6.74 cm/s LV PW:         1.00 cm   LV E/e' medial:  11.1 LV IVS:        1.30 cm   LV e' lateral:   10.70 cm/s LVOT diam:     1.90 cm   LV E/e' lateral: 7.0 LV SV:         54 LV SV Index:   25 LVOT Area:     2.84 cm  RIGHT VENTRICLE RV S prime:     16.00 cm/s TAPSE (M-mode): 2.1 cm LEFT ATRIUM             Index        RIGHT ATRIUM           Index LA diam:        4.20 cm 1.94 cm/m   RA Area:     23.40  cm LA Vol (A2C):   49.1 ml 22.68 ml/m  RA Volume:   62.50 ml  28.87 ml/m LA Vol (A4C):   60.0 ml 27.72 ml/m LA Biplane Vol: 54.3 ml 25.08 ml/m  AORTIC VALVE LVOT Vmax:   95.80 cm/s LVOT Vmean:  63.900 cm/s LVOT VTI:    0.189 m  AORTA Ao Root diam: 4.00 cm MITRAL VALVE                TRICUSPID VALVE MV Area (PHT): 1.97 cm     TR Peak grad:   33.4 mmHg MV Decel Time: 386 msec     TR Vmax:        289.00 cm/s MV E velocity: 74.90 cm/s MV A velocity: 131.00 cm/s  SHUNTS MV E/A ratio:  0.57         Systemic VTI:  0.19 m                             Systemic Diam: 1.90 cm Jenkins Rouge MD Electronically signed by Jenkins Rouge MD Signature Date/Time:  03/13/2022/1:19:48 PM    Final    MR ANGIO HEAD WO CONTRAST  Result Date: 03/13/2022 CLINICAL DATA:  Provided history: Neuro deficit, acute, stroke suspected. EXAM: MRA HEAD WITHOUT CONTRAST MRA NECK WITHOUT CONTRAST TECHNIQUE: Angiographic images of the Circle of Willis were acquired using MRA technique without intravenous contrast. Angiographic images of the neck were acquired using MRA technique without intravenous contrast. Carotid stenosis measurements (when applicable) are obtained utilizing NASCET criteria, using the distal internal carotid diameter as the denominator. COMPARISON:  Brain MRI performed earlier today 03/13/2022. CT angiogram head/neck 07/10/2020. FINDINGS: MRA HEAD FINDINGS Anterior circulation: The intracranial internal carotid arteries are patent. The M1 middle cerebral arteries are patent. No M2 proximal branch occlusion or high-grade proximal stenosis. The anterior cerebral arteries are patent. No intracranial aneurysm is identified. Posterior circulation: The intracranial vertebral arteries are patent. The basilar artery is patent. The right PCA is fetal in origin. Developmentally diminutive left P1 segment with sizable left posterior communicating artery. Anatomic variants: As described. MRA NECK FINDINGS Aortic arch: Excluded from the field  of view. Right carotid system: Noncontrast technique limits evaluation of the proximal common carotid arteries. Within this limitation, the common carotid and internal carotid arteries are patent within the neck without appreciable hemodynamically significant stenosis. Left carotid system: Noncontrast technique limits evaluation of the proximal common carotid arteries. Within this limitation, the common carotid and internal carotid arteries are patent within the neck without appreciable hemodynamically significant stenosis. Vertebral arteries: Motion degradation and non-contrast technique preclude evaluation of the left vertebral artery V1 segment, and the very proximal right vertebral artery V1 segment. Additionally, non-contrast technique precludes evaluation of the bilateral vertebral artery V3 segments. Elsewhere, the vertebral arteries are patent within the neck without appreciable hemodynamically significant stenosis. IMPRESSION: MRA head: No intracranial large vessel occlusion or proximal high-grade arterial stenosis. MRA neck: 1. Non-contrast technique limits evaluation of the proximal common carotid arteries. Within this limitation, the common carotid and internal carotid arteries are patent within the neck without hemodynamically significant stenosis. 2. Motion degradation and non-contrast technique precludes evaluation of the left vertebral artery V1 segment, and the right vertebral artery proximal V1 segment. Additionally, non-contrast technique precludes evaluation of the bilateral vertebral artery V3 segments. Elsewhere, the vertebral arteries are patent within the neck without appreciable hemodynamically significant stenosis. Electronically Signed   By: Kellie Simmering D.O.   On: 03/13/2022 10:58   MR ANGIO NECK WO CONTRAST  Result Date: 03/13/2022 CLINICAL DATA:  Provided history: Neuro deficit, acute, stroke suspected. EXAM: MRA HEAD WITHOUT CONTRAST MRA NECK WITHOUT CONTRAST TECHNIQUE:  Angiographic images of the Circle of Willis were acquired using MRA technique without intravenous contrast. Angiographic images of the neck were acquired using MRA technique without intravenous contrast. Carotid stenosis measurements (when applicable) are obtained utilizing NASCET criteria, using the distal internal carotid diameter as the denominator. COMPARISON:  Brain MRI performed earlier today 03/13/2022. CT angiogram head/neck 07/10/2020. FINDINGS: MRA HEAD FINDINGS Anterior circulation: The intracranial internal carotid arteries are patent. The M1 middle cerebral arteries are patent. No M2 proximal branch occlusion or high-grade proximal stenosis. The anterior cerebral arteries are patent. No intracranial aneurysm is identified. Posterior circulation: The intracranial vertebral arteries are patent. The basilar artery is patent. The right PCA is fetal in origin. Developmentally diminutive left P1 segment with sizable left posterior communicating artery. Anatomic variants: As described. MRA NECK FINDINGS Aortic arch: Excluded from the field of view. Right carotid system: Noncontrast technique limits evaluation of the proximal common carotid arteries. Within  this limitation, the common carotid and internal carotid arteries are patent within the neck without appreciable hemodynamically significant stenosis. Left carotid system: Noncontrast technique limits evaluation of the proximal common carotid arteries. Within this limitation, the common carotid and internal carotid arteries are patent within the neck without appreciable hemodynamically significant stenosis. Vertebral arteries: Motion degradation and non-contrast technique preclude evaluation of the left vertebral artery V1 segment, and the very proximal right vertebral artery V1 segment. Additionally, non-contrast technique precludes evaluation of the bilateral vertebral artery V3 segments. Elsewhere, the vertebral arteries are patent within the neck without  appreciable hemodynamically significant stenosis. IMPRESSION: MRA head: No intracranial large vessel occlusion or proximal high-grade arterial stenosis. MRA neck: 1. Non-contrast technique limits evaluation of the proximal common carotid arteries. Within this limitation, the common carotid and internal carotid arteries are patent within the neck without hemodynamically significant stenosis. 2. Motion degradation and non-contrast technique precludes evaluation of the left vertebral artery V1 segment, and the right vertebral artery proximal V1 segment. Additionally, non-contrast technique precludes evaluation of the bilateral vertebral artery V3 segments. Elsewhere, the vertebral arteries are patent within the neck without appreciable hemodynamically significant stenosis. Electronically Signed   By: Kellie Simmering D.O.   On: 03/13/2022 10:58   MR BRAIN WO CONTRAST  Result Date: 03/13/2022 CLINICAL DATA:  Confusion EXAM: MRI HEAD WITHOUT CONTRAST TECHNIQUE: Multiplanar, multiecho pulse sequences of the brain and surrounding structures were obtained without intravenous contrast. COMPARISON:  CT head 1 day prior FINDINGS: Brain: There is diffusion restriction with associated FLAIR signal abnormality in the right centrum semiovale extending to the parietal lobe cortex with some involvement of the postcentral gyrus consistent with acute infarct. There is no associated hemorrhage or mass effect. There is no other evidence of acute infarct. There is no acute intracranial hemorrhage or extra-axial fluid collection. There is mild background parenchymal volume loss the ventricles are stable in size. There is extensive confluent FLAIR signal abnormality in the supratentorial white matter and pons consistent with advanced chronic small vessel ischemic change. There are small remote infarcts in the bilateral basal gangl,ia thalami, and right cerebellar hemisphere. There are scattered punctate chronic microhemorrhages in the  brainstem, thalami, basal ganglia, and occipital lobe, likely hypertensive. There is no mass lesion.  There is no mass effect or midline shift. Vascular: Normal flow voids. Skull and upper cervical spine: Normal marrow signal. Sinuses/Orbits: The paranasal sinuses are clear. The globes and orbits are unremarkable. Other: None. IMPRESSION: 1. 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 2. Small remote lacunar infarcts and background advanced chronic small vessel ischemic change. 3. Scattered punctate chronic microhemorrhages are likely hypertensive in etiology. These results were called by telephone at the time of interpretation on 03/13/2022 at 9:08 am to provider Dr Kathrynn Humble, who verbally acknowledged these results. Electronically Signed   By: Valetta Mole M.D.   On: 03/13/2022 09:08   CT Angio Chest Aorta W and/or Wo Contrast  Result Date: 03/13/2022 CLINICAL DATA:  71 year old male with stroke-like symptoms, loss of coordination, 4.5 cm fusiform ascending thoracic aortic aneurysm on lung cancer screening chest CT in March. EXAM: CT ANGIOGRAPHY CHEST WITH CONTRAST TECHNIQUE: Multidetector CT imaging of the chest was performed using the standard protocol during bolus administration of intravenous contrast. Multiplanar CT image reconstructions and MIPs were obtained to evaluate the vascular anatomy. RADIATION DOSE REDUCTION: This exam was performed according to the departmental dose-optimization program which includes automated exposure control, adjustment  of the mA and/or kV according to patient size and/or use of iterative reconstruction technique. CONTRAST:  29mL OMNIPAQUE IOHEXOL 350 MG/ML SOLN COMPARISON:  Noncontrast chest CT 07/01/2021. FINDINGS: Cardiovascular: Pre contrast and postcontrast CTA images. Satisfactory aortic contrast but dominant pulmonary artery contrast timing. Stable ascending aorta size and configuration. Stable  tortuosity of the arch. Calcified aortic atherosclerosis. Negative for thoracic aortic dissection. Mild mixing artifact in the distal visible aorta. Visible proximal abdominal aortic branches appear to remain patent. No pulmonary artery filling defect identified. Mild cardiomegaly which might be eccentric rated by lower lung volumes today. No pericardial effusion. Mediastinum/Nodes: Generalized increased mediastinal and right hilar lymph nodes since March, with individual nodal tissue now 15-20 mm maximal short axis (subcentimeter in March). No discrete mediastinal mass. No axillary lymphadenopathy. Lungs/Pleura: Centrilobular emphysema. Major airways are patent. Mildly lower lung volumes with increased platelike left lower lobe atelectasis. Platelike opacity also in the right upper lobe along the minor fissure now. Stable small 3-4 mm right lung nodule series 9, image 51 since 07/01/2021. Mild bronchiectasis and architectural distortion in the left upper lobe on series 9, image 81 is stable. No pleural effusion or acute pulmonary inflammation identified. Upper Abdomen: Negative mostly noncontrast visible liver, gallbladder, spleen, pancreas, adrenal glands in the upper abdomen. Bilateral low-density renal areas with simple fluid density are most likely benign cysts (no follow-up imaging recommended). Visible kidneys are nonobstructed. Decompressed stomach with bulky duodenal and/or proximal small bowel diverticula measuring 5.4 cm on series 7, image 138. This was partially visible in March. Otherwise negative visible bowel. Musculoskeletal: No acute or suspicious osseous lesion identified. Review of the MIP images confirms the above findings. IMPRESSION: 1. Nonspecific new Mediastinal And Right Hilar Lymphadenopathy since March this year. No explanatory underlying pneumonia or pulmonary inflammation is identified. Lymphoproliferative disorder or metastatic disease cannot be excluded. 2. But chronic lung disease  with Emphysema (ICD10-J43.9) appears stable from Low-dose Screening Chest CT 07/01/2021 except for new plate-like atelectasis. Recommend repeat low-dose chest CT without contrast (please use the following order, "CT CHEST LCS NODULE FOLLOW-UP W/O CM" ). 3. Negative for thoracic aortic dissection. No evidence of pulmonary embolus stable Ascending thoracic aortic 4.5 cm aneurysm. Ascending thoracic. Aortic aneurysm. Recommend semi-annual imaging followup by CTA or MRA and referral to cardiothoracic surgery if not already obtained. This recommendation follows 2010 ACCF/AHA/AATS/ACR/ASA/SCA/SCAI/SIR/STS/SVM Guidelines for the diagnosis and Management of Patients With Thoracic Aortic Disease. Circulaton. 2010; 121: O177-N165. Aortic aneurysm NOS (ICD10-I71.9). Aortic Atherosclerosis (ICD10-I70.0) Electronically Signed   By: Genevie Ann M.D.   On: 03/13/2022 07:52   DG Chest 2 View  Result Date: 03/13/2022 CLINICAL DATA:  71 year old male with history of stroke-like symptoms. Loss of coordination. EXAM: CHEST - 2 VIEW COMPARISON:  Chest x-ray 06/04/2020. FINDINGS: Linear opacities in the left base and right mid to lower lung have an appearance of platelike atelectasis or chronic scarring on today's examination, but are new compared to the prior study. No confluent consolidative airspace disease. No pleural effusions. No pneumothorax. No definite suspicious appearing pulmonary nodules or masses are noted. No evidence of pulmonary edema. Heart size is normal. Atherosclerotic calcifications are noted within the tortuous thoracic aorta. IMPRESSION: 1. Increased subsegmental atelectasis and/or scarring in the lower lungs. 2. Aortic atherosclerosis. Electronically Signed   By: Vinnie Langton M.D.   On: 03/13/2022 06:00   CT HEAD WO CONTRAST (5MM)  Result Date: 03/12/2022 CLINICAL DATA:  Delirium.  Confusion. EXAM: CT HEAD WITHOUT CONTRAST TECHNIQUE: Contiguous axial images were obtained from  the base of the skull  through the vertex without intravenous contrast. RADIATION DOSE REDUCTION: This exam was performed according to the departmental dose-optimization program which includes automated exposure control, adjustment of the mA and/or kV according to patient size and/or use of iterative reconstruction technique. COMPARISON:  CT examination dated July 10, 2020 FINDINGS: Brain: No evidence of acute infarction, hemorrhage, hydrocephalus, extra-axial collection or mass lesion/mass effect. Diffuse low-attenuation of the periventricular and subcortical white matter presumed advanced chronic microvascular ischemic changes. Mild cerebral atrophy. Vascular: No hyperdense vessel or unexpected calcification. Skull: Normal. Negative for fracture or focal lesion. Sinuses/Orbits: No acute finding. Other: None. IMPRESSION: 1. No acute intracranial abnormality. 2. Advanced chronic microvascular ischemic changes of the white matter and mild cerebral atrophy. Electronically Signed   By: Keane Police D.O.   On: 03/12/2022 16:04    12-lead ECG 01/19/2022 showed NSR at 91 bpm (personally reviewed) All prior EKG's in EPIC reviewed with no documented atrial fibrillation  Telemetry NSR 60-70s (personally reviewed)  Assessment and Plan:  1. Cryptogenic stroke The patient presents with cryptogenic stroke.  The patient does not have a TEE planned for this AM.  I spoke at length with the patient about monitoring for afib with an implantable loop recorder.  Risks, benefits, and alteratives to implantable loop recorder were discussed with the patient today.   At this time, the patient is very clear in their decision to proceed with implantable loop recorder.   2. Aortic Aneurysm, thoracic 4.5 cm ascending thoracic seen on CT Stable by CTA 03/13/2022  Wound care was reviewed with the patient (keep incision clean and dry for 3 days). Please call with questions.   Shirley Friar, PA-C 03/15/2022 11:00 AM

## 2022-03-14 NOTE — Evaluation (Signed)
Occupational Therapy Evaluation Patient Details Name: Gavin Dennis MRN: [id number] DOB: 03-20-1951 Today's Date: 03/14/2022   History of Present Illness 71 y.o. male with medical history significant of embolic stroke 4287, left eye vision loss, ascending aortic aneurysm, HTN, presented with persistent coordination problems. MRI showed acute stroke in the right centrum semiovale extending to the parietal lobe without hemorrhage.   Clinical Impression   Pt is typically independent in ADL and mobility, utilizes the bus for transportation and his son lives with him. Today he presents with decreased overall function in LUE (especially hand) with motor and sensory deficits, visual deficits, decreased cognition (no family present to determine baseline) and decreased overall balance, functioning. At this time recommending SNF post-acute to maximize safety, independence in ADL and functional transfers. Especially since at this time his son is also hospitalized so will not have his normal support structure in place.Pt provided with built up handles, theraputty exercise handout, as well as initiated education for low vision compensatory strategies. OT will continue to follow acutely.      Recommendations for follow up therapy are one component of a multi-disciplinary discharge planning process, led by the attending physician.  Recommendations may be updated based on patient status, additional functional criteria and insurance authorization.   Follow Up Recommendations  Skilled nursing-short term rehab (<3 hours/day)     Assistance Recommended at Discharge Intermittent Supervision/Assistance  Patient can return home with the following A little help with walking and/or transfers;A lot of help with bathing/dressing/bathroom;Assistance with cooking/housework;Assistance with feeding;Direct supervision/assist for medications management;Direct supervision/assist for financial management;Assist for  transportation;Help with stairs or ramp for entrance    Functional Status Assessment  Patient has had a recent decline in their functional status and demonstrates the ability to make significant improvements in function in a reasonable and predictable amount of time.  Equipment Recommendations  Other (comment) (defer to next venue)    Recommendations for Other Services PT consult;Speech consult     Precautions / Restrictions Precautions Precautions: Fall Restrictions Weight Bearing Restrictions: No      Mobility Bed Mobility               General bed mobility comments: OOB in recliner at beginning and end of session    Transfers Overall transfer level: Needs assistance Equipment used: None Transfers: Sit to/from Stand Sit to Stand: Min guard           General transfer comment: increased time, UE use; mild instability standing unsupported      Balance Overall balance assessment: Needs assistance   Sitting balance-Leahy Scale: Good     Standing balance support: No upper extremity supported Standing balance-Leahy Scale: Fair Standing balance comment: able to stand at sink for grooming tasks                           ADL either performed or assessed with clinical judgement   ADL Overall ADL's : Needs assistance/impaired Eating/Feeding: Moderate assistance;With adaptive utensils;Sitting Eating/Feeding Details (indicate cue type and reason): Pt ahd trouble locating food on plate, cutting up food, easily frustrated Grooming: Wash/dry hands;Wash/dry face;Oral care;Minimal assistance;Standing Grooming Details (indicate cue type and reason): Pt requires increased time for doing tasks as simple as reaching out for the water nozzle. He required assist for opening containers (toothpaste) able to stand min guard at sink Upper Body Bathing: Min guard   Lower Body Bathing: Min guard;Sitting/lateral leans   Upper Body Dressing : Set up;Sitting  Lower Body  Dressing: Minimal assistance;Sitting/lateral leans Lower Body Dressing Details (indicate cue type and reason): to don socks and shoes Toilet Transfer: Minimal assistance (1 HHA)   Toileting- Clothing Manipulation and Hygiene: Min guard       Functional mobility during ADLs: Min guard;Minimal assistance (1 HHA) General ADL Comments: decreased coordination and fine motor with LUE and decreased problem solving impacting ability to adequately perform ADL independently     Vision Baseline Vision/History: 1 Wears glasses Ability to See in Adequate Light: 1 Impaired Patient Visual Report: Other (comment) (Pt has been using reading glasses "I shouldn't have done that without a perscription because now I can't see without them") Vision Assessment?: Vision impaired- to be further tested in functional context Additional Comments: struggled to read from the menu, no glasses present, struggled to see items on plate and in darker navigation of room     Perception     Praxis      Pertinent Vitals/Pain Pain Assessment Pain Assessment: Faces Faces Pain Scale: Hurts little more Pain Location: L knee pain chronic Pain Descriptors / Indicators: Tightness, Aching Pain Intervention(s): Limited activity within patient's tolerance, Monitored during session, Repositioned     Hand Dominance Left   Extremity/Trunk Assessment Upper Extremity Assessment Upper Extremity Assessment: LUE deficits/detail LUE Deficits / Details: deficits in coordination, and sensation, strength grossly 4+/5, increased time and effort for movements to occur LUE Sensation: decreased light touch;decreased proprioception LUE Coordination: decreased fine motor   Lower Extremity Assessment Lower Extremity Assessment: Defer to PT evaluation LLE Deficits / Details: AROM WFL, strength grossly 4/5, decreased coordination with toe tapping as compared to R LLE Coordination: decreased gross motor       Communication  Communication Communication: No difficulties   Cognition Arousal/Alertness: Awake/alert Behavior During Therapy: WFL for tasks assessed/performed Overall Cognitive Status: Impaired/Different from baseline Area of Impairment: Memory, Problem solving, Awareness                     Memory: Decreased short-term memory     Awareness: Emergent Problem Solving: Requires verbal cues, Difficulty sequencing General Comments: Pt really struggles with coming up alternative ways to complete tasks with LUE which is normally dominant hand. decreased awareness, dragging hand through mashed potatoes     General Comments  provided built up handles for eating/utensils, initiated education for low vision (increase lighting and contrast etc)    Exercises Exercises: Other exercises Other Exercises Other Exercises: yellow theraputty and Handout provided, reviewed in full and Pt completed   Shoulder Instructions      Home Living Family/patient expects to be discharged to:: Private residence Living Arrangements: Children (son lives with pt, is in hospital as well) Available Help at Discharge:  (pt reports son went to mental hospital same day he was admitted) Type of Home: House Home Access: Stairs to enter Technical brewer of Steps: 5 Entrance Stairs-Rails: Right;Left;Can reach both Home Layout: One level     Bathroom Shower/Tub: Teacher, early years/pre: Standard     Home Equipment: None   Additional Comments: has fall alert button/necklace  Lives With:  (son resides with him)    Prior Functioning/Environment Prior Level of Function : Independent/Modified Independent               ADLs Comments: cooks and cleans, grocery shops, takes the bus        OT Problem List: Decreased strength;Decreased activity tolerance;Impaired balance (sitting and/or standing);Impaired vision/perception;Decreased coordination;Decreased knowledge of use of DME  or AE;Decreased  knowledge of precautions;Impaired sensation;Impaired UE functional use      OT Treatment/Interventions: Therapeutic exercise;Self-care/ADL training;DME and/or AE instruction;Therapeutic activities;Cognitive remediation/compensation;Visual/perceptual remediation/compensation;Patient/family education;Balance training    OT Goals(Current goals can be found in the care plan section) Acute Rehab OT Goals Patient Stated Goal: get L hand working better OT Goal Formulation: With patient Time For Goal Achievement: 03/28/22 Potential to Achieve Goals: Good ADL Goals Pt Will Perform Grooming: with modified independence;standing Pt Will Perform Upper Body Dressing: with modified independence;sitting Pt Will Perform Lower Body Dressing: with modified independence;sit to/from stand Pt Will Transfer to Toilet: with modified independence;ambulating Pt Will Perform Toileting - Clothing Manipulation and hygiene: with modified independence;sit to/from stand Pt/caregiver will Perform Home Exercise Program: Left upper extremity;With theraputty;Independently;With written HEP provided Additional ADL Goal #1: Pt will verbalize 3 strategies for low vision without any cues  OT Frequency: Min 2X/week    Co-evaluation              AM-PAC OT "6 Clicks" Daily Activity     Outcome Measure Help from another person eating meals?: A Lot Help from another person taking care of personal grooming?: A Little Help from another person toileting, which includes using toliet, bedpan, or urinal?: A Little Help from another person bathing (including washing, rinsing, drying)?: A Little Help from another person to put on and taking off regular upper body clothing?: A Lot Help from another person to put on and taking off regular lower body clothing?: A Lot 6 Click Score: 15   End of Session Equipment Utilized During Treatment: Gait belt Nurse Communication: Mobility status;Other (comment) (check on him at feeding  time)  Activity Tolerance: Patient tolerated treatment well Patient left: in chair;with call bell/phone within reach;with chair alarm set  OT Visit Diagnosis: Unsteadiness on feet (R26.81);Other abnormalities of gait and mobility (R26.89);Muscle weakness (generalized) (M62.81);Low vision, both eyes (H54.2);Feeding difficulties (R63.3);Other symptoms and signs involving the nervous system (R29.898);Hemiplegia and hemiparesis Hemiplegia - Right/Left: Left Hemiplegia - dominant/non-dominant: Dominant Hemiplegia - caused by: Cerebral infarction                Time: 1140-1216 OT Time Calculation (min): 36 min Charges:  OT General Charges $OT Visit: 1 Visit OT Evaluation $OT Eval Moderate Complexity: 1 Mod OT Treatments $Self Care/Home Management : 8-22 mins  Jesse Sans OTR/L Acute Rehabilitation Services Office: [place] 03/14/2022, 2:09 PM

## 2022-03-14 NOTE — Progress Notes (Signed)
Bilateral lower extremity venous duplex has been completed. Preliminary results can be found in CV Proc through chart review.   03/14/22 9:36 AM Carlos Levering RVT

## 2022-03-14 NOTE — Progress Notes (Addendum)
STROKE TEAM PROGRESS NOTE   INTERVAL HISTORY Currently working with PT. Recommend loop recorder prior to discharge and patient is agreeable.  He had strokes in 2022 that were thought to be embolic vs SVD. Venous duplex negative for DVT.  He states that he feels like he doesn't have good control over his left hand, for example, he is having a hard time opening his hand to release objects when he wants to.   Vitals:   03/13/22 2044 03/13/22 2330 03/14/22 0423 03/14/22 0740  BP: (!) 137/97 128/82 (!) 124/94 135/85  Pulse: 60 (!) 53 (!) 59 80  Resp: 16 16 16 16   Temp: 97.8 F (36.6 C) 98 F (36.7 C) 98 F (36.7 C) 98.3 F (36.8 C)  TempSrc: Oral Oral Oral   SpO2: 100% 97% 99%   Height:       CBC:  Recent Labs  Lab 03/12/22 1439 03/14/22 0417  WBC 5.1 4.2  NEUTROABS 2.5  --   HGB 14.3 12.9*  HCT 42.8 38.4*  MCV 88.1 88.3  PLT 268 735   Basic Metabolic Panel:  Recent Labs  Lab 03/12/22 1439 03/13/22 0627 03/14/22 0417  NA 139  --  138  K 3.0*  --  3.2*  CL 101  --  103  CO2 28  --  28  GLUCOSE 143*  --  114*  BUN 24*  --  22  CREATININE 1.45*  --  1.18  CALCIUM 10.5*  --  9.8  MG  --  1.3*  --    Lipid Panel:  Recent Labs  Lab 03/14/22 0417  CHOL 66  TRIG 48  HDL 23*  CHOLHDL 2.9  VLDL 10  LDLCALC 33   HgbA1c:  Recent Labs  Lab 03/13/22 2051  HGBA1C 6.7*   Urine Drug Screen: No results for input(s): "LABOPIA", "COCAINSCRNUR", "LABBENZ", "AMPHETMU", "THCU", "LABBARB" in the last 168 hours.  Alcohol Level No results for input(s): "ETH" in the last 168 hours.  IMAGING past 24 hours VAS US CAROTID  Result Date: 03/13/2022 Carotid Arterial Duplex Study Patient Name:  Gavin Dennis  Date of Exam:   03/13/2022 Medical Rec #: [id number]     Accession #:    [id number] Date of Birth: [birth year]     Patient Gender: M Patient Age:   36 years Exam Location:  [facility] Procedure:      VAS US CAROTID Referring Phys: Amie Portland  --------------------------------------------------------------------------------  Risk Factors:      Hypertension, hyperlipidemia, Diabetes, past history of                    smoking, prior CVA. Comparison Study:  No previous Performing Technologist: Jody Hill RVT, RDMS  Examination Guidelines: A complete evaluation includes B-mode imaging, spectral Doppler, color Doppler, and power Doppler as needed of all accessible portions of each vessel. Bilateral testing is considered an integral part of a complete examination. Limited examinations for reoccurring indications may be performed as noted.  Right Carotid Findings: +----------+--------+--------+--------+------------------+------------------+           PSV cm/sEDV cm/sStenosisPlaque DescriptionComments           +----------+--------+--------+--------+------------------+------------------+ CCA Prox  48      11                                intimal thickening +----------+--------+--------+--------+------------------+------------------+ CCA Distal37      12  intimal thickening +----------+--------+--------+--------+------------------+------------------+ ICA Prox  32      13                                                   +----------+--------+--------+--------+------------------+------------------+ ICA Distal36      17                                                   +----------+--------+--------+--------+------------------+------------------+ ECA       46      9                                                    +----------+--------+--------+--------+------------------+------------------+ +----------+--------+-------+----------------+-------------------+           PSV cm/sEDV cmsDescribe        Arm Pressure (mmHG) +----------+--------+-------+----------------+-------------------+ [id number]             Multiphasic, WNL                     +----------+--------+-------+----------------+-------------------+ +---------+--------+--+--------+-+---------+ VertebralPSV cm/s21EDV cm/s6Antegrade +---------+--------+--+--------+-+---------+  Left Carotid Findings: +----------+--------+--------+--------+------------------+------------------+           PSV cm/sEDV cm/sStenosisPlaque DescriptionComments           +----------+--------+--------+--------+------------------+------------------+ CCA Prox  61      14                                                   +----------+--------+--------+--------+------------------+------------------+ CCA Distal36      11                                intimal thickening +----------+--------+--------+--------+------------------+------------------+ ICA Prox  29      10                                                   +----------+--------+--------+--------+------------------+------------------+ ICA Distal39      17                                                   +----------+--------+--------+--------+------------------+------------------+ ECA       44      7                                                    +----------+--------+--------+--------+------------------+------------------+ +----------+--------+--------+----------------+-------------------+           PSV cm/sEDV cm/sDescribe        Arm Pressure (mmHG) +----------+--------+--------+----------------+-------------------+ [id number]  Multiphasic, WNL                    +----------+--------+--------+----------------+-------------------+ +---------+--------+--+--------+-+---------+ VertebralPSV cm/s18EDV cm/s4Antegrade +---------+--------+--+--------+-+---------+   Summary: Right Carotid: The extracranial vessels were near-normal with only minimal wall                thickening or plaque. Left Carotid: The extracranial vessels were near-normal with only minimal wall               thickening or  plaque. Vertebrals:  Bilateral vertebral arteries demonstrate antegrade flow. Subclavians: Normal flow hemodynamics were seen in bilateral subclavian              arteries. *See table(s) above for measurements and observations.     Preliminary    ECHOCARDIOGRAM COMPLETE  Result Date: 03/13/2022    ECHOCARDIOGRAM REPORT   Patient Name:   Gavin Dennis Date of Exam: 03/13/2022 Medical Rec #:  [id number]    Height:       71.0 in Accession #:    [id number]   Weight:       212.8 lb Date of Birth:  1951-04-25    BSA:          2.165 m Patient Age:    52 years     BP:           120/72 mmHg Patient Gender: M            HR:           60 bpm. Exam Location:  [facility] Procedure: 2D Echo, Cardiac Doppler and Color Doppler Indications:    Stroke I63.9  History:        Patient has prior history of Echocardiogram examinations, most                 recent 07/09/2020. Stroke; Risk Factors:Hypertension,                 Dyslipidemia and Diabetes.  Sonographer:    Alvino Chapel RCS Referring Phys: Lequita Halt IMPRESSIONS  1. Left ventricular ejection fraction, by estimation, is 60 to 65%. The left ventricle has normal function. The left ventricle has no regional wall motion abnormalities. There is mild left ventricular hypertrophy. Left ventricular diastolic parameters were normal.  2. Right ventricular systolic function is normal. The right ventricular size is normal. There is mildly elevated pulmonary artery systolic pressure.  3. Left atrial size was mildly dilated.  4. The mitral valve is abnormal. Trivial mitral valve regurgitation. No evidence of mitral stenosis.  5. The aortic valve is tricuspid. There is mild calcification of the aortic valve. There is mild thickening of the aortic valve. Aortic valve regurgitation is not visualized. Aortic valve sclerosis is present, with no evidence of aortic valve stenosis.  6. Aortic dilatation noted. There is moderate dilatation of the ascending aorta, measuring 40 mm.  7. The inferior  vena cava is normal in size with greater than 50% respiratory variability, suggesting right atrial pressure of 3 mmHg. FINDINGS  Left Ventricle: Left ventricular ejection fraction, by estimation, is 60 to 65%. The left ventricle has normal function. The left ventricle has no regional wall motion abnormalities. The left ventricular internal cavity size was normal in size. There is  mild left ventricular hypertrophy. Left ventricular diastolic parameters were normal. Right Ventricle: The right ventricular size is normal. No increase in right ventricular wall thickness. Right ventricular systolic function is normal. There is mildly elevated pulmonary artery systolic pressure. The  tricuspid regurgitant velocity is 2.89  m/s, and with an assumed right atrial pressure of 3 mmHg, the estimated right ventricular systolic pressure is 06.3 mmHg. Left Atrium: Left atrial size was mildly dilated. Right Atrium: Right atrial size was normal in size. Pericardium: There is no evidence of pericardial effusion. Mitral Valve: The mitral valve is abnormal. There is mild thickening of the mitral valve leaflet(s). Trivial mitral valve regurgitation. No evidence of mitral valve stenosis. Tricuspid Valve: The tricuspid valve is normal in structure. Tricuspid valve regurgitation is mild . No evidence of tricuspid stenosis. Aortic Valve: The aortic valve is tricuspid. There is mild calcification of the aortic valve. There is mild thickening of the aortic valve. Aortic valve regurgitation is not visualized. Aortic valve sclerosis is present, with no evidence of aortic valve stenosis. Pulmonic Valve: The pulmonic valve was normal in structure. Pulmonic valve regurgitation is not visualized. No evidence of pulmonic stenosis. Aorta: Aortic dilatation noted. There is moderate dilatation of the ascending aorta, measuring 40 mm. Venous: The inferior vena cava is normal in size with greater than 50% respiratory variability, suggesting right atrial  pressure of 3 mmHg. IAS/Shunts: No atrial level shunt detected by color flow Doppler.  LEFT VENTRICLE PLAX 2D LVIDd:         4.50 cm   Diastology LVIDs:         2.70 cm   LV e' medial:    6.74 cm/s LV PW:         1.00 cm   LV E/e' medial:  11.1 LV IVS:        1.30 cm   LV e' lateral:   10.70 cm/s LVOT diam:     1.90 cm   LV E/e' lateral: 7.0 LV SV:         54 LV SV Index:   25 LVOT Area:     2.84 cm  RIGHT VENTRICLE RV S prime:     16.00 cm/s TAPSE (M-mode): 2.1 cm LEFT ATRIUM             Index        RIGHT ATRIUM           Index LA diam:        4.20 cm 1.94 cm/m   RA Area:     23.40 cm LA Vol (A2C):   49.1 ml 22.68 ml/m  RA Volume:   62.50 ml  28.87 ml/m LA Vol (A4C):   60.0 ml 27.72 ml/m LA Biplane Vol: 54.3 ml 25.08 ml/m  AORTIC VALVE LVOT Vmax:   95.80 cm/s LVOT Vmean:  63.900 cm/s LVOT VTI:    0.189 m  AORTA Ao Root diam: 4.00 cm MITRAL VALVE                TRICUSPID VALVE MV Area (PHT): 1.97 cm     TR Peak grad:   33.4 mmHg MV Decel Time: 386 msec     TR Vmax:        289.00 cm/s MV E velocity: 74.90 cm/s MV A velocity: 131.00 cm/s  SHUNTS MV E/A ratio:  0.57         Systemic VTI:  0.19 m                             Systemic Diam: 1.90 cm Jenkins Rouge MD Electronically signed by Jenkins Rouge MD Signature Date/Time: 03/13/2022/1:19:48 PM    Final    MR ANGIO  HEAD WO CONTRAST  Result Date: 03/13/2022 CLINICAL DATA:  Provided history: Neuro deficit, acute, stroke suspected. EXAM: MRA HEAD WITHOUT CONTRAST MRA NECK WITHOUT CONTRAST TECHNIQUE: Angiographic images of the Circle of Willis were acquired using MRA technique without intravenous contrast. Angiographic images of the neck were acquired using MRA technique without intravenous contrast. Carotid stenosis measurements (when applicable) are obtained utilizing NASCET criteria, using the distal internal carotid diameter as the denominator. COMPARISON:  Brain MRI performed earlier today 03/13/2022. CT angiogram head/neck 07/10/2020. FINDINGS: MRA HEAD  FINDINGS Anterior circulation: The intracranial internal carotid arteries are patent. The M1 middle cerebral arteries are patent. No M2 proximal branch occlusion or high-grade proximal stenosis. The anterior cerebral arteries are patent. No intracranial aneurysm is identified. Posterior circulation: The intracranial vertebral arteries are patent. The basilar artery is patent. The right PCA is fetal in origin. Developmentally diminutive left P1 segment with sizable left posterior communicating artery. Anatomic variants: As described. MRA NECK FINDINGS Aortic arch: Excluded from the field of view. Right carotid system: Noncontrast technique limits evaluation of the proximal common carotid arteries. Within this limitation, the common carotid and internal carotid arteries are patent within the neck without appreciable hemodynamically significant stenosis. Left carotid system: Noncontrast technique limits evaluation of the proximal common carotid arteries. Within this limitation, the common carotid and internal carotid arteries are patent within the neck without appreciable hemodynamically significant stenosis. Vertebral arteries: Motion degradation and non-contrast technique preclude evaluation of the left vertebral artery V1 segment, and the very proximal right vertebral artery V1 segment. Additionally, non-contrast technique precludes evaluation of the bilateral vertebral artery V3 segments. Elsewhere, the vertebral arteries are patent within the neck without appreciable hemodynamically significant stenosis. IMPRESSION: MRA head: No intracranial large vessel occlusion or proximal high-grade arterial stenosis. MRA neck: 1. Non-contrast technique limits evaluation of the proximal common carotid arteries. Within this limitation, the common carotid and internal carotid arteries are patent within the neck without hemodynamically significant stenosis. 2. Motion degradation and non-contrast technique precludes evaluation of  the left vertebral artery V1 segment, and the right vertebral artery proximal V1 segment. Additionally, non-contrast technique precludes evaluation of the bilateral vertebral artery V3 segments. Elsewhere, the vertebral arteries are patent within the neck without appreciable hemodynamically significant stenosis. Electronically Signed   By: Kellie Simmering D.O.   On: 03/13/2022 10:58   MR ANGIO NECK WO CONTRAST  Result Date: 03/13/2022 CLINICAL DATA:  Provided history: Neuro deficit, acute, stroke suspected. EXAM: MRA HEAD WITHOUT CONTRAST MRA NECK WITHOUT CONTRAST TECHNIQUE: Angiographic images of the Circle of Willis were acquired using MRA technique without intravenous contrast. Angiographic images of the neck were acquired using MRA technique without intravenous contrast. Carotid stenosis measurements (when applicable) are obtained utilizing NASCET criteria, using the distal internal carotid diameter as the denominator. COMPARISON:  Brain MRI performed earlier today 03/13/2022. CT angiogram head/neck 07/10/2020. FINDINGS: MRA HEAD FINDINGS Anterior circulation: The intracranial internal carotid arteries are patent. The M1 middle cerebral arteries are patent. No M2 proximal branch occlusion or high-grade proximal stenosis. The anterior cerebral arteries are patent. No intracranial aneurysm is identified. Posterior circulation: The intracranial vertebral arteries are patent. The basilar artery is patent. The right PCA is fetal in origin. Developmentally diminutive left P1 segment with sizable left posterior communicating artery. Anatomic variants: As described. MRA NECK FINDINGS Aortic arch: Excluded from the field of view. Right carotid system: Noncontrast technique limits evaluation of the proximal common carotid arteries. Within this limitation, the common carotid and internal carotid arteries are patent  within the neck without appreciable hemodynamically significant stenosis. Left carotid system: Noncontrast  technique limits evaluation of the proximal common carotid arteries. Within this limitation, the common carotid and internal carotid arteries are patent within the neck without appreciable hemodynamically significant stenosis. Vertebral arteries: Motion degradation and non-contrast technique preclude evaluation of the left vertebral artery V1 segment, and the very proximal right vertebral artery V1 segment. Additionally, non-contrast technique precludes evaluation of the bilateral vertebral artery V3 segments. Elsewhere, the vertebral arteries are patent within the neck without appreciable hemodynamically significant stenosis. IMPRESSION: MRA head: No intracranial large vessel occlusion or proximal high-grade arterial stenosis. MRA neck: 1. Non-contrast technique limits evaluation of the proximal common carotid arteries. Within this limitation, the common carotid and internal carotid arteries are patent within the neck without hemodynamically significant stenosis. 2. Motion degradation and non-contrast technique precludes evaluation of the left vertebral artery V1 segment, and the right vertebral artery proximal V1 segment. Additionally, non-contrast technique precludes evaluation of the bilateral vertebral artery V3 segments. Elsewhere, the vertebral arteries are patent within the neck without appreciable hemodynamically significant stenosis. Electronically Signed   By: Kellie Simmering D.O.   On: 03/13/2022 10:58    PHYSICAL EXAM  Physical Exam  Constitutional: Appears well-developed and well-nourished.   Cardiovascular: Normal rate and regular rhythm.  Respiratory: Effort normal, non-labored breathing  Neuro: Mental Status: Patient is awake, alert, oriented to person, place, month, year, and situation. Patient is able to give a clear and coherent history. No signs of aphasia or neglect Cranial Nerves: II: Visual Fields are full. Pupils are equal, round, and reactive to light.   III,IV, VI: EOMI  without ptosis or diploplia.  V: Facial sensation is symmetric to temperature VII: Facial movement is symmetric resting and smiling VIII: Hearing is intact to voice X: Palate elevates symmetrically XI: Shoulder shrug is symmetric. XII: Tongue protrudes midline without atrophy or fasciculations.  Motor: Tone is normal. Bulk is normal. 5/5 strength was present in all four extremities.  Sensory: Sensation is symmetric to light touch and temperature in the arms and legs. No extinction to DSS present.  Plantars: Toes are downgoing bilaterally.  Cerebellar: Ataxia noted in LUE, slowed RAM Ataxia noted in LLE with HKS and when lifting his leg to walk Gait: Unsteady      ASSESSMENT/PLAN Mr. Cherry Wittwer is a 71 y.o. male with history of CVA, HTN, HLD,Vit B12 deficiency and polysubstance abuse who presents to hospital for evaluation of not feeling right and the inability to use his left hand. He does not notice any weakness in his left upper extremity but he has noticed that his hand does not always do what he wants it to do, ie setting down his fork or letting go of the walker. He does also report a left knee injury from a previous fall that has affected his balance.   Stroke:  Cortical infarct in the right frontal lobe, embolic pattern, etiology unclear, concerning for afib  Code Stroke CT head No acute intracranial abnormality. Advanced chronic microvascular ischemic changes of the white matter and mild cerebral atrophy. MRI - Acute infarct in the right centrum semiovale extending to the parietal lobe cortex without hemorrhage or mass effect. Small remote lacunar infarcts  MRA head and neck - motion degraded but no LVO or significant stenosis LE Venous duplex- negative for DVT  2D Echo EF 60-65%, mildly dilated LA LDL 33 HgbA1c 6.7 Loop recorder recommended to rule out afib VTE prophylaxis - Lovenox aspirin 81 mg daily  prior to admission, now on aspirin 81 mg daily and clopidogrel 75 mg  daily DAPT for 3 weeks and then plavix alone. PT also candidate for oceanic trial, and he seems interested.   Therapy recommendations:  SNF Disposition:  pending  Hx stroke/TIA 07/10/2020 admitted for vision changes, MRI showed multiple Concurrent Bilateral subcortical and midbrain lacunar Infarcts.  MRA negative, CT head and neck unremarkable, EF 55 to 60%, LDL 75, A1c 6.3.  Patient discharged DAPT and Lipitor 20 Followed up with Dr. Leta Baptist at [facility]  Hypertension Home meds:  Coreg 6.25, Norvasc 10mg  Stable Long-term BP goal normotensive  Hyperlipidemia Home meds:  Atorvastatin 40mg , resumed in hospital LDL 33, goal < 70 Continue statin at discharge  Other Stroke Risk Factors Advanced Age >/= 51  Former cigarette smoker Significant family history of stroke  Other Active Problems Hypokalemia, hypomagnesemia- replete per primary team Ascending aortic aneurysm Mediastinal and right hilar lympadenopathy  Hospital day # 1  Patient seen and examined by NP/APP with MD. MD to update note as needed.   Janine Ores, DNP, FNP-BC [facility] Pager: [phone]  ATTENDING NOTE: I reviewed above note and agree with the assessment and plan. Pt was seen and examined.   71 year old male with history of hypertension, hyperlipidemia, substance abuse, stroke in 06/2020 admitted for left hand weakness.  MRI showed right anterior MCA wedge-shaped embolic infarcts.  MRA head and neck unremarkable.  Carotid Doppler unremarkable.  EF 60 to 60%.  LDL 73, A1c 6.7, UDS negative.  Creatinine 1.18, LE venous Doppler no DVT.  Patient had a stroke in 06/2018, admitted for vision change, MRI showed multiple Concurrent Bilateral subcortical and midbrain lacunar Infarcts.  MRA negative, CT head and neck unremarkable, EF 55 to 60%, LDL 75, A1c 6.3.  Patient discharged DAPT and Lipitor 20.  No residual at baseline.  Patient followed up with Dr. Leta Baptist at [facility].  On exam, patient AAOx3, no  aphasia, follows simple commands, no significant dysarthria, able to name and repeat.  Visual fields full, no gaze palsy, facial symmetrical.  Moving all extremities symmetrically except left hand decreased dexterity with mild dysmetria.  Sensation symmetrical.  Patient current stroke and previous stroke most embolic pattern, etiology unclear, concerning for cardioembolic source.  Recommend loop recorder before discharge to rule out A-fib.  Currently on aspirin 81 Plavix 75 DAPT for 3 weeks and then Plavix alone.  Continue Lipitor 40.  Patient is OCEANIC trial candidate, will give information to read.  PT/OT recommend SNF.  For detailed assessment and plan, please refer to above/below as I have made changes wherever appropriate.   Rosalin Hawking, MD PhD Stroke Neurology 03/14/2022 5:36 PM    To contact Stroke Continuity provider, please refer to [email or web address] After hours, contact General Neurology

## 2022-03-14 NOTE — Progress Notes (Signed)
[facility] PROGRESS NOTE   Gavin Dennis TDV:[id number] DOB: 07-12-1950 DOA: 03/12/2022  PCP: Mckinley Jewel, MD  Brief History/Interval Summary: 71 y.o. male with medical history significant of embolic stroke 0626, left eye vision loss, ascending aortic aneurysm, HTN, presented with persistent coordination problems. Patient had a embolic stroke event March 2022 when he also contracted COVID infection.  He was on aspirin and Plavix for 3 weeks and stayed on aspirin alone for antiplatelet.  TTE showed there is no ventricle thrombosis.  He has no history of A-fib either. MRI showed acute stroke in the right centrum semiovale extending to the parietal lobe without hemorrhage.  Consultants: Neurology  Procedures: Possible loop recorder placement prior to discharge    Subjective/Interval History: Patient denies any weakness in any 1 side of his body.  No headaches.  No nausea or vomiting.  No chest pain or shortness of breath.    Assessment/Plan:  Acute stroke Concern for embolic etiology. Neurology is following and managing. Patient currently on aspirin and Plavix.  Patient noted to be on statin. LDL 33, HbA1c 6.7. Echocardiogram shows normal systolic function.  No shunts identified.  No mention of any thrombus. No significant stenosis on carotid Doppler. Lower extremity Doppler ordered by neurology.  They are planning a loop recorder placement as well due to concern for atrial fibrillation. PT and OT evaluation is pending  Essential hypertension Monitor blood pressures closely.  Currently allowing permissive hypertension.  Hyperlipidemia Continue statin.  Diabetes mellitus type 2 Metformin on hold.  HbA1c 6.7.  Monitor CBGs.  Hypokalemia and hypomagnesemia This will be repleted  Incidental finding of mediastinal and right hilar lymphadenopathy Noted since March of this year.  Emphysematous changes were also noted.  No other acute findings noted. We will send  ambulatory referral to pulmonology for outpatient management.  Ascending aortic aneurysm Incidentally noted on imaging studies and echocardiogram.  Patient was seen by Dr. Kipp Brood with cardiothoracic team in April of this year.  Plan is for repeat CT scan in 1 year time.   DVT Prophylaxis: Lovenox Code Status: Full code Family Communication: Discussed with patient Disposition Plan: To be determined  Status is: Inpatient Remains inpatient appropriate because: Acute stroke    Medications: Scheduled:  aspirin EC  81 mg Oral Daily   atorvastatin  40 mg Oral Daily   clopidogrel  75 mg Oral Daily   enoxaparin (LOVENOX) injection  40 mg Subcutaneous Q24H   insulin aspart  0-9 Units Subcutaneous TID WC   Continuous: [id number] **OR** acetaminophen (TYLENOL) oral liquid 160 mg/5 mL **OR** acetaminophen, hydrALAZINE, LORazepam, senna-docusate  Antibiotics: Anti-infectives (From admission, onward)    None       Objective:  Vital Signs  Vitals:   03/13/22 2044 03/13/22 2330 03/14/22 0423 03/14/22 0740  BP: (!) 137/97 128/82 (!) 124/94 135/85  Pulse: 60 (!) 53 (!) 59 80  Resp: 16 16 16 16   Temp: 97.8 F (36.6 C) 98 F (36.7 C) 98 F (36.7 C) 98.3 F (36.8 C)  TempSrc: Oral Oral Oral   SpO2: 100% 97% 99%   Height:        Intake/Output Summary (Last 24 hours) at 03/14/2022 1031 Last data filed at 03/13/2022 1317 Gross per 24 hour  Intake 200 ml  Output --  Net 200 ml   There were no vitals filed for this visit.  General appearance: Awake alert.  In no distress Resp: Clear to auscultation bilaterally.  Normal effort Cardio: S1-S2 is normal regular.  No S3-S4.  No rubs murmurs or bruit GI: Abdomen is soft.  Nontender nondistended.  Bowel sounds are present normal.  No masses organomegaly Extremities: No edema.  Full range of motion of lower extremities. Neurologic: Alert and oriented x3.  No focal neurological deficits.    Lab Results:  Data Reviewed: I  have personally reviewed following labs and reports of the imaging studies  CBC: Recent Labs  Lab 03/12/22 1439 03/14/22 0417  WBC 5.1 4.2  NEUTROABS 2.5  --   HGB 14.3 12.9*  HCT 42.8 38.4*  MCV 88.1 88.3  PLT 268 417    Basic Metabolic Panel: Recent Labs  Lab 03/12/22 1439 03/13/22 0627 03/14/22 0417  NA 139  --  138  K 3.0*  --  3.2*  CL 101  --  103  CO2 28  --  28  GLUCOSE 143*  --  114*  BUN 24*  --  22  CREATININE 1.45*  --  1.18  CALCIUM 10.5*  --  9.8  MG  --  1.3* 1.4*    GFR: Estimated Creatinine Clearance: 69 mL/min (by C-G formula based on SCr of 1.18 mg/dL).   HbA1C: Recent Labs    03/13/22 1125 03/13/22 2051  HGBA1C 6.7* 6.7*    CBG: Recent Labs  Lab 03/13/22 1222 03/13/22 1618 03/13/22 2154 03/14/22 0724  GLUCAP 106* 165* 94 97    Lipid Profile: Recent Labs    03/14/22 0417  CHOL 66  HDL 23*  LDLCALC 33  TRIG 48  CHOLHDL 2.9     Radiology Studies: VAS [place] LOWER EXTREMITY VENOUS (DVT)  Result Date: 03/14/2022  Lower Venous DVT Study Patient Name:  Gavin Dennis  Date of Exam:   03/14/2022 Medical Rec #: [id number]     Accession #:    [id number] Date of Birth: 1950/06/28     Patient Gender: M Patient Age:   71 years Exam Location:  [facility] Procedure:      VAS [place] LOWER EXTREMITY VENOUS (DVT) Referring Phys: Cornelius Moras XU --------------------------------------------------------------------------------  Indications: Edema.  Risk Factors: DVT. Anticoagulation: Lovenox. Comparison Study: 03/10/2022 - Right:                   No evidence of thrombosis in the subclavian.                    Left:                   Findings consistent with acute deep vein thrombosis involving                   the left                   brachial                   veins and left ulnar veins. Performing Technologist: Oliver Hum RVT  Examination Guidelines: A complete evaluation includes B-mode imaging, spectral Doppler, color Doppler, and power  Doppler as needed of all accessible portions of each vessel. Bilateral testing is considered an integral part of a complete examination. Limited examinations for reoccurring indications may be performed as noted. The reflux portion of the exam is performed with the patient in reverse Trendelenburg.  +---------+---------------+---------+-----------+----------+-------------------+ RIGHT    CompressibilityPhasicitySpontaneityPropertiesThrombus Aging      +---------+---------------+---------+-----------+----------+-------------------+ CFV      Full           Yes  Yes                                      +---------+---------------+---------+-----------+----------+-------------------+ SFJ      Full                                                             +---------+---------------+---------+-----------+----------+-------------------+ FV Prox  Full                                                             +---------+---------------+---------+-----------+----------+-------------------+ FV Mid   Full                                                             +---------+---------------+---------+-----------+----------+-------------------+ FV DistalFull                                                             +---------+---------------+---------+-----------+----------+-------------------+ PFV      Full                                                             +---------+---------------+---------+-----------+----------+-------------------+ POP      Full           Yes      Yes                                      +---------+---------------+---------+-----------+----------+-------------------+ PTV      Full                                                             +---------+---------------+---------+-----------+----------+-------------------+ PERO                                                  Not well visualized  +---------+---------------+---------+-----------+----------+-------------------+   +---------+---------------+---------+-----------+----------+--------------+ LEFT     CompressibilityPhasicitySpontaneityPropertiesThrombus Aging +---------+---------------+---------+-----------+----------+--------------+ CFV      Full           Yes      Yes                                 +---------+---------------+---------+-----------+----------+--------------+  SFJ      Full                                                        +---------+---------------+---------+-----------+----------+--------------+ FV Prox  Full                                                        +---------+---------------+---------+-----------+----------+--------------+ FV Mid   Full                                                        +---------+---------------+---------+-----------+----------+--------------+ FV DistalFull                                                        +---------+---------------+---------+-----------+----------+--------------+ PFV      Full                                                        +---------+---------------+---------+-----------+----------+--------------+ POP      Full           Yes      Yes                                 +---------+---------------+---------+-----------+----------+--------------+ PTV      Full                                                        +---------+---------------+---------+-----------+----------+--------------+ PERO     Full                                                        +---------+---------------+---------+-----------+----------+--------------+     Summary: RIGHT: - There is no evidence of deep vein thrombosis in the lower extremity. However, portions of this examination were limited- see technologist comments above.  - No cystic structure found in the popliteal fossa.  LEFT: - There is no evidence of deep  vein thrombosis in the lower extremity. However, portions of this examination were limited- see technologist comments above.  - No cystic structure found in the popliteal fossa.  *See table(s) above for measurements and observations.    Preliminary    VAS US CAROTID  Result Date: 03/13/2022 Carotid Arterial Duplex Study Patient Name:  Gavin Dennis  Date of Exam:  03/13/2022 Medical Rec #: [id number]     Accession #:    [id number] Date of Birth: 1950-09-02     Patient Gender: M Patient Age:   73 years Exam Location:  [facility] Procedure:      VAS US CAROTID Referring Phys: ASHISH ARORA --------------------------------------------------------------------------------  Risk Factors:      Hypertension, hyperlipidemia, Diabetes, past history of                    smoking, prior CVA. Comparison Study:  No previous Performing Technologist: Jody Hill RVT, RDMS  Examination Guidelines: A complete evaluation includes B-mode imaging, spectral Doppler, color Doppler, and power Doppler as needed of all accessible portions of each vessel. Bilateral testing is considered an integral part of a complete examination. Limited examinations for reoccurring indications may be performed as noted.  Right Carotid Findings: +----------+--------+--------+--------+------------------+------------------+           PSV cm/sEDV cm/sStenosisPlaque DescriptionComments           +----------+--------+--------+--------+------------------+------------------+ CCA Prox  48      11                                intimal thickening +----------+--------+--------+--------+------------------+------------------+ CCA Distal37      12                                intimal thickening +----------+--------+--------+--------+------------------+------------------+ ICA Prox  32      13                                                   +----------+--------+--------+--------+------------------+------------------+ ICA Distal36       17                                                   +----------+--------+--------+--------+------------------+------------------+ ECA       46      9                                                    +----------+--------+--------+--------+------------------+------------------+ +----------+--------+-------+----------------+-------------------+           PSV cm/sEDV cmsDescribe        Arm Pressure (mmHG) +----------+--------+-------+----------------+-------------------+ [id number]             Multiphasic, WNL                    +----------+--------+-------+----------------+-------------------+ +---------+--------+--+--------+-+---------+ VertebralPSV cm/s21EDV cm/s6Antegrade +---------+--------+--+--------+-+---------+  Left Carotid Findings: +----------+--------+--------+--------+------------------+------------------+           PSV cm/sEDV cm/sStenosisPlaque DescriptionComments           +----------+--------+--------+--------+------------------+------------------+ CCA Prox  61      14                                                   +----------+--------+--------+--------+------------------+------------------+  CCA Distal36      11                                intimal thickening +----------+--------+--------+--------+------------------+------------------+ ICA Prox  29      10                                                   +----------+--------+--------+--------+------------------+------------------+ ICA Distal39      17                                                   +----------+--------+--------+--------+------------------+------------------+ ECA       44      7                                                    +----------+--------+--------+--------+------------------+------------------+ +----------+--------+--------+----------------+-------------------+           PSV cm/sEDV cm/sDescribe        Arm Pressure (mmHG)  +----------+--------+--------+----------------+-------------------+ [id number]              Multiphasic, WNL                    +----------+--------+--------+----------------+-------------------+ +---------+--------+--+--------+-+---------+ VertebralPSV cm/s18EDV cm/s4Antegrade +---------+--------+--+--------+-+---------+   Summary: Right Carotid: The extracranial vessels were near-normal with only minimal wall                thickening or plaque. Left Carotid: The extracranial vessels were near-normal with only minimal wall               thickening or plaque. Vertebrals:  Bilateral vertebral arteries demonstrate antegrade flow. Subclavians: Normal flow hemodynamics were seen in bilateral subclavian              arteries. *See table(s) above for measurements and observations.     Preliminary    ECHOCARDIOGRAM COMPLETE  Result Date: 03/13/2022    ECHOCARDIOGRAM REPORT   Patient Name:   Gavin Dennis Date of Exam: 03/13/2022 Medical Rec #:  [id number]    Height:       71.0 in Accession #:    [id number]   Weight:       212.8 lb Date of Birth:  1951/02/06    BSA:          2.165 m Patient Age:    29 years     BP:           120/72 mmHg Patient Gender: M            HR:           60 bpm. Exam Location:  [facility] Procedure: 2D Echo, Cardiac Doppler and Color Doppler Indications:    Stroke I63.9  History:        Patient has prior history of Echocardiogram examinations, most                 recent 07/09/2020. Stroke; Risk Factors:Hypertension,  Dyslipidemia and Diabetes.  Sonographer:    Alvino Chapel RCS Referring Phys: Lequita Halt IMPRESSIONS  1. Left ventricular ejection fraction, by estimation, is 60 to 65%. The left ventricle has normal function. The left ventricle has no regional wall motion abnormalities. There is mild left ventricular hypertrophy. Left ventricular diastolic parameters were normal.  2. Right ventricular systolic function is normal. The right ventricular size is normal.  There is mildly elevated pulmonary artery systolic pressure.  3. Left atrial size was mildly dilated.  4. The mitral valve is abnormal. Trivial mitral valve regurgitation. No evidence of mitral stenosis.  5. The aortic valve is tricuspid. There is mild calcification of the aortic valve. There is mild thickening of the aortic valve. Aortic valve regurgitation is not visualized. Aortic valve sclerosis is present, with no evidence of aortic valve stenosis.  6. Aortic dilatation noted. There is moderate dilatation of the ascending aorta, measuring 40 mm.  7. The inferior vena cava is normal in size with greater than 50% respiratory variability, suggesting right atrial pressure of 3 mmHg. FINDINGS  Left Ventricle: Left ventricular ejection fraction, by estimation, is 60 to 65%. The left ventricle has normal function. The left ventricle has no regional wall motion abnormalities. The left ventricular internal cavity size was normal in size. There is  mild left ventricular hypertrophy. Left ventricular diastolic parameters were normal. Right Ventricle: The right ventricular size is normal. No increase in right ventricular wall thickness. Right ventricular systolic function is normal. There is mildly elevated pulmonary artery systolic pressure. The tricuspid regurgitant velocity is 2.89  m/s, and with an assumed right atrial pressure of 3 mmHg, the estimated right ventricular systolic pressure is 17.4 mmHg. Left Atrium: Left atrial size was mildly dilated. Right Atrium: Right atrial size was normal in size. Pericardium: There is no evidence of pericardial effusion. Mitral Valve: The mitral valve is abnormal. There is mild thickening of the mitral valve leaflet(s). Trivial mitral valve regurgitation. No evidence of mitral valve stenosis. Tricuspid Valve: The tricuspid valve is normal in structure. Tricuspid valve regurgitation is mild . No evidence of tricuspid stenosis. Aortic Valve: The aortic valve is tricuspid. There is  mild calcification of the aortic valve. There is mild thickening of the aortic valve. Aortic valve regurgitation is not visualized. Aortic valve sclerosis is present, with no evidence of aortic valve stenosis. Pulmonic Valve: The pulmonic valve was normal in structure. Pulmonic valve regurgitation is not visualized. No evidence of pulmonic stenosis. Aorta: Aortic dilatation noted. There is moderate dilatation of the ascending aorta, measuring 40 mm. Venous: The inferior vena cava is normal in size with greater than 50% respiratory variability, suggesting right atrial pressure of 3 mmHg. IAS/Shunts: No atrial level shunt detected by color flow Doppler.  LEFT VENTRICLE PLAX 2D LVIDd:         4.50 cm   Diastology LVIDs:         2.70 cm   LV e' medial:    6.74 cm/s LV PW:         1.00 cm   LV E/e' medial:  11.1 LV IVS:        1.30 cm   LV e' lateral:   10.70 cm/s LVOT diam:     1.90 cm   LV E/e' lateral: 7.0 LV SV:         54 LV SV Index:   25 LVOT Area:     2.84 cm  RIGHT VENTRICLE RV S prime:     16.00  cm/s TAPSE (M-mode): 2.1 cm LEFT ATRIUM             Index        RIGHT ATRIUM           Index LA diam:        4.20 cm 1.94 cm/m   RA Area:     23.40 cm LA Vol (A2C):   49.1 ml 22.68 ml/m  RA Volume:   62.50 ml  28.87 ml/m LA Vol (A4C):   60.0 ml 27.72 ml/m LA Biplane Vol: 54.3 ml 25.08 ml/m  AORTIC VALVE LVOT Vmax:   95.80 cm/s LVOT Vmean:  63.900 cm/s LVOT VTI:    0.189 m  AORTA Ao Root diam: 4.00 cm MITRAL VALVE                TRICUSPID VALVE MV Area (PHT): 1.97 cm     TR Peak grad:   33.4 mmHg MV Decel Time: 386 msec     TR Vmax:        289.00 cm/s MV E velocity: 74.90 cm/s MV A velocity: 131.00 cm/s  SHUNTS MV E/A ratio:  0.57         Systemic VTI:  0.19 m                             Systemic Diam: 1.90 cm Jenkins Rouge MD Electronically signed by Jenkins Rouge MD Signature Date/Time: 03/13/2022/1:19:48 PM    Final    MR ANGIO HEAD WO CONTRAST  Result Date: 03/13/2022 CLINICAL DATA:  Provided history:  Neuro deficit, acute, stroke suspected. EXAM: MRA HEAD WITHOUT CONTRAST MRA NECK WITHOUT CONTRAST TECHNIQUE: Angiographic images of the Circle of Willis were acquired using MRA technique without intravenous contrast. Angiographic images of the neck were acquired using MRA technique without intravenous contrast. Carotid stenosis measurements (when applicable) are obtained utilizing NASCET criteria, using the distal internal carotid diameter as the denominator. COMPARISON:  Brain MRI performed earlier today 03/13/2022. CT angiogram head/neck 07/10/2020. FINDINGS: MRA HEAD FINDINGS Anterior circulation: The intracranial internal carotid arteries are patent. The M1 middle cerebral arteries are patent. No M2 proximal branch occlusion or high-grade proximal stenosis. The anterior cerebral arteries are patent. No intracranial aneurysm is identified. Posterior circulation: The intracranial vertebral arteries are patent. The basilar artery is patent. The right PCA is fetal in origin. Developmentally diminutive left P1 segment with sizable left posterior communicating artery. Anatomic variants: As described. MRA NECK FINDINGS Aortic arch: Excluded from the field of view. Right carotid system: Noncontrast technique limits evaluation of the proximal common carotid arteries. Within this limitation, the common carotid and internal carotid arteries are patent within the neck without appreciable hemodynamically significant stenosis. Left carotid system: Noncontrast technique limits evaluation of the proximal common carotid arteries. Within this limitation, the common carotid and internal carotid arteries are patent within the neck without appreciable hemodynamically significant stenosis. Vertebral arteries: Motion degradation and non-contrast technique preclude evaluation of the left vertebral artery V1 segment, and the very proximal right vertebral artery V1 segment. Additionally, non-contrast technique precludes evaluation of the  bilateral vertebral artery V3 segments. Elsewhere, the vertebral arteries are patent within the neck without appreciable hemodynamically significant stenosis. IMPRESSION: MRA head: No intracranial large vessel occlusion or proximal high-grade arterial stenosis. MRA neck: 1. Non-contrast technique limits evaluation of the proximal common carotid arteries. Within this limitation, the common carotid and internal carotid arteries are patent within the neck without hemodynamically significant  stenosis. 2. Motion degradation and non-contrast technique precludes evaluation of the left vertebral artery V1 segment, and the right vertebral artery proximal V1 segment. Additionally, non-contrast technique precludes evaluation of the bilateral vertebral artery V3 segments. Elsewhere, the vertebral arteries are patent within the neck without appreciable hemodynamically significant stenosis. Electronically Signed   By: Kellie Simmering D.O.   On: 03/13/2022 10:58   MR ANGIO NECK WO CONTRAST  Result Date: 03/13/2022 CLINICAL DATA:  Provided history: Neuro deficit, acute, stroke suspected. EXAM: MRA HEAD WITHOUT CONTRAST MRA NECK WITHOUT CONTRAST TECHNIQUE: Angiographic images of the Circle of Willis were acquired using MRA technique without intravenous contrast. Angiographic images of the neck were acquired using MRA technique without intravenous contrast. Carotid stenosis measurements (when applicable) are obtained utilizing NASCET criteria, using the distal internal carotid diameter as the denominator. COMPARISON:  Brain MRI performed earlier today 03/13/2022. CT angiogram head/neck 07/10/2020. FINDINGS: MRA HEAD FINDINGS Anterior circulation: The intracranial internal carotid arteries are patent. The M1 middle cerebral arteries are patent. No M2 proximal branch occlusion or high-grade proximal stenosis. The anterior cerebral arteries are patent. No intracranial aneurysm is identified. Posterior circulation: The intracranial  vertebral arteries are patent. The basilar artery is patent. The right PCA is fetal in origin. Developmentally diminutive left P1 segment with sizable left posterior communicating artery. Anatomic variants: As described. MRA NECK FINDINGS Aortic arch: Excluded from the field of view. Right carotid system: Noncontrast technique limits evaluation of the proximal common carotid arteries. Within this limitation, the common carotid and internal carotid arteries are patent within the neck without appreciable hemodynamically significant stenosis. Left carotid system: Noncontrast technique limits evaluation of the proximal common carotid arteries. Within this limitation, the common carotid and internal carotid arteries are patent within the neck without appreciable hemodynamically significant stenosis. Vertebral arteries: Motion degradation and non-contrast technique preclude evaluation of the left vertebral artery V1 segment, and the very proximal right vertebral artery V1 segment. Additionally, non-contrast technique precludes evaluation of the bilateral vertebral artery V3 segments. Elsewhere, the vertebral arteries are patent within the neck without appreciable hemodynamically significant stenosis. IMPRESSION: MRA head: No intracranial large vessel occlusion or proximal high-grade arterial stenosis. MRA neck: 1. Non-contrast technique limits evaluation of the proximal common carotid arteries. Within this limitation, the common carotid and internal carotid arteries are patent within the neck without hemodynamically significant stenosis. 2. Motion degradation and non-contrast technique precludes evaluation of the left vertebral artery V1 segment, and the right vertebral artery proximal V1 segment. Additionally, non-contrast technique precludes evaluation of the bilateral vertebral artery V3 segments. Elsewhere, the vertebral arteries are patent within the neck without appreciable hemodynamically significant stenosis.  Electronically Signed   By: Kellie Simmering D.O.   On: 03/13/2022 10:58   MR BRAIN WO CONTRAST  Result Date: 03/13/2022 CLINICAL DATA:  Confusion EXAM: MRI HEAD WITHOUT CONTRAST TECHNIQUE: Multiplanar, multiecho pulse sequences of the brain and surrounding structures were obtained without intravenous contrast. COMPARISON:  CT head 1 day prior FINDINGS: Brain: There is diffusion restriction with associated FLAIR signal abnormality in the right centrum semiovale extending to the parietal lobe cortex with some involvement of the postcentral gyrus consistent with acute infarct. There is no associated hemorrhage or mass effect. There is no other evidence of acute infarct. There is no acute intracranial hemorrhage or extra-axial fluid collection. There is mild background parenchymal volume loss the ventricles are stable in size. There is extensive confluent FLAIR signal abnormality in the supratentorial white matter and pons consistent with advanced chronic small  vessel ischemic change. There are small remote infarcts in the bilateral basal gangl,ia thalami, and right cerebellar hemisphere. There are scattered punctate chronic microhemorrhages in the brainstem, thalami, basal ganglia, and occipital lobe, likely hypertensive. There is no mass lesion.  There is no mass effect or midline shift. Vascular: Normal flow voids. Skull and upper cervical spine: Normal marrow signal. Sinuses/Orbits: The paranasal sinuses are clear. The globes and orbits are unremarkable. Other: None. IMPRESSION: 1. 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 2. Small remote lacunar infarcts and background advanced chronic small vessel ischemic change. 3. Scattered punctate chronic microhemorrhages are likely hypertensive in etiology. These results were called by telephone at the time of interpretation on 03/13/2022 at 9:08 am to provider Dr Kathrynn Humble, who verbally  acknowledged these results. Electronically Signed   By: Valetta Mole M.D.   On: 03/13/2022 09:08   CT Angio Chest Aorta W and/or Wo Contrast  Result Date: 03/13/2022 CLINICAL DATA:  71 year old male with stroke-like symptoms, loss of coordination, 4.5 cm fusiform ascending thoracic aortic aneurysm on lung cancer screening chest CT in March. EXAM: CT ANGIOGRAPHY CHEST WITH CONTRAST TECHNIQUE: Multidetector CT imaging of the chest was performed using the standard protocol during bolus administration of intravenous contrast. Multiplanar CT image reconstructions and MIPs were obtained to evaluate the vascular anatomy. RADIATION DOSE REDUCTION: This exam was performed according to the departmental dose-optimization program which includes automated exposure control, adjustment of the mA and/or kV according to patient size and/or use of iterative reconstruction technique. CONTRAST:  13mL OMNIPAQUE IOHEXOL 350 MG/ML SOLN COMPARISON:  Noncontrast chest CT 07/01/2021. FINDINGS: Cardiovascular: Pre contrast and postcontrast CTA images. Satisfactory aortic contrast but dominant pulmonary artery contrast timing. Stable ascending aorta size and configuration. Stable tortuosity of the arch. Calcified aortic atherosclerosis. Negative for thoracic aortic dissection. Mild mixing artifact in the distal visible aorta. Visible proximal abdominal aortic branches appear to remain patent. No pulmonary artery filling defect identified. Mild cardiomegaly which might be eccentric rated by lower lung volumes today. No pericardial effusion. Mediastinum/Nodes: Generalized increased mediastinal and right hilar lymph nodes since March, with individual nodal tissue now 15-20 mm maximal short axis (subcentimeter in March). No discrete mediastinal mass. No axillary lymphadenopathy. Lungs/Pleura: Centrilobular emphysema. Major airways are patent. Mildly lower lung volumes with increased platelike left lower lobe atelectasis. Platelike opacity  also in the right upper lobe along the minor fissure now. Stable small 3-4 mm right lung nodule series 9, image 51 since 07/01/2021. Mild bronchiectasis and architectural distortion in the left upper lobe on series 9, image 81 is stable. No pleural effusion or acute pulmonary inflammation identified. Upper Abdomen: Negative mostly noncontrast visible liver, gallbladder, spleen, pancreas, adrenal glands in the upper abdomen. Bilateral low-density renal areas with simple fluid density are most likely benign cysts (no follow-up imaging recommended). Visible kidneys are nonobstructed. Decompressed stomach with bulky duodenal and/or proximal small bowel diverticula measuring 5.4 cm on series 7, image 138. This was partially visible in March. Otherwise negative visible bowel. Musculoskeletal: No acute or suspicious osseous lesion identified. Review of the MIP images confirms the above findings. IMPRESSION: 1. Nonspecific new Mediastinal And Right Hilar Lymphadenopathy since March this year. No explanatory underlying pneumonia or pulmonary inflammation is identified. Lymphoproliferative disorder or metastatic disease cannot be excluded. 2. But chronic lung disease with Emphysema (ICD10-J43.9) appears stable from Low-dose Screening Chest CT 07/01/2021 except for new plate-like atelectasis. Recommend repeat low-dose chest CT without contrast (please  use the following order, "CT CHEST LCS NODULE FOLLOW-UP W/O CM" ). 3. Negative for thoracic aortic dissection. No evidence of pulmonary embolus stable Ascending thoracic aortic 4.5 cm aneurysm. Ascending thoracic. Aortic aneurysm. Recommend semi-annual imaging followup by CTA or MRA and referral to cardiothoracic surgery if not already obtained. This recommendation follows 2010 ACCF/AHA/AATS/ACR/ASA/SCA/SCAI/SIR/STS/SVM Guidelines for the diagnosis and Management of Patients With Thoracic Aortic Disease. Circulaton. 2010; 121: O350-K938. Aortic aneurysm NOS (ICD10-I71.9). Aortic  Atherosclerosis (ICD10-I70.0) Electronically Signed   By: Genevie Ann M.D.   On: 03/13/2022 07:52   DG Chest 2 View  Result Date: 03/13/2022 CLINICAL DATA:  71 year old male with history of stroke-like symptoms. Loss of coordination. EXAM: CHEST - 2 VIEW COMPARISON:  Chest x-ray 06/04/2020. FINDINGS: Linear opacities in the left base and right mid to lower lung have an appearance of platelike atelectasis or chronic scarring on today's examination, but are new compared to the prior study. No confluent consolidative airspace disease. No pleural effusions. No pneumothorax. No definite suspicious appearing pulmonary nodules or masses are noted. No evidence of pulmonary edema. Heart size is normal. Atherosclerotic calcifications are noted within the tortuous thoracic aorta. IMPRESSION: 1. Increased subsegmental atelectasis and/or scarring in the lower lungs. 2. Aortic atherosclerosis. Electronically Signed   By: Vinnie Langton M.D.   On: 03/13/2022 06:00   CT HEAD WO CONTRAST (5MM)  Result Date: 03/12/2022 CLINICAL DATA:  Delirium.  Confusion. EXAM: CT HEAD WITHOUT CONTRAST TECHNIQUE: Contiguous axial images were obtained from the base of the skull through the vertex without intravenous contrast. RADIATION DOSE REDUCTION: This exam was performed according to the departmental dose-optimization program which includes automated exposure control, adjustment of the mA and/or kV according to patient size and/or use of iterative reconstruction technique. COMPARISON:  CT examination dated July 10, 2020 FINDINGS: Brain: No evidence of acute infarction, hemorrhage, hydrocephalus, extra-axial collection or mass lesion/mass effect. Diffuse low-attenuation of the periventricular and subcortical white matter presumed advanced chronic microvascular ischemic changes. Mild cerebral atrophy. Vascular: No hyperdense vessel or unexpected calcification. Skull: Normal. Negative for fracture or focal lesion. Sinuses/Orbits: No acute  finding. Other: None. IMPRESSION: 1. No acute intracranial abnormality. 2. Advanced chronic microvascular ischemic changes of the white matter and mild cerebral atrophy. Electronically Signed   By: Keane Police D.O.   On: 03/12/2022 16:04       LOS: 1 day   [facility] Pager on [email or web address]  03/14/2022, 10:31 AM

## 2022-03-14 NOTE — Evaluation (Addendum)
Physical Therapy Evaluation Patient Details Name: Gavin Dennis MRN: [id number] DOB: 1950-08-18 Today's Date: 03/14/2022  History of Present Illness  71 y.o. 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 MRI showed acute stroke in the right centrum semiovale extending to the parietal lobe without hemorrhage.  Clinical Impression  Patient presents with decreased mobility due to decreased L side coordination and prior history of L knee pain.  States no recent falls, but does have fall alert button he wears due to his knee issues.  Son lives with him, but pt states he is currently in the hospital as well.  Patient able to walk with cane, walker and no device during session with likely most appropriate for fall prevention to be cane after further training.  Patient educated on fall prevention, but feel given L UE incoordination and he is L hand dominant he is concerned he cannot effectively take care of himself.  He will benefit from STSNF level rehab prior to d/c home.  PT will continue to follow during acute stay.      Recommendations for follow up therapy are one component of a multi-disciplinary discharge planning process, led by the attending physician.  Recommendations may be updated based on patient status, additional functional criteria and insurance authorization.  Follow Up Recommendations Skilled nursing-short term rehab (<3 hours/day)      Assistance Recommended at Discharge Frequent or constant Supervision/Assistance  Patient can return home with the following  A little help with walking and/or transfers;Direct supervision/assist for medications management;Assistance with cooking/housework;Assist for transportation;Help with stairs or ramp for entrance;A little help with bathing/dressing/bathroom    Equipment Recommendations Cane  Recommendations for Other Services        Functional Status Assessment Patient has had a recent decline in their functional status and demonstrates the ability to make significant improvements in function in a reasonable and predictable amount of time.     Precautions / Restrictions Precautions Precautions: Fall      Mobility  Bed Mobility   Bed Mobility: Supine to Sit     Supine to sit: Supervision     General bed mobility comments: no physical assistance, S for safety    Transfers Overall transfer level: Needs assistance Equipment used: None Transfers: Sit to/from Stand Sit to Stand: Supervision           General transfer comment: increased time, UE use; mild instability standing unsupported    Ambulation/Gait Ambulation/Gait assistance: Min guard, Min assist, Supervision Gait Distance (Feet): 200 Feet (&80') Assistive device: None, Rolling walker (2 wheels), Straight cane Gait Pattern/deviations: Step-to pattern, Step-through pattern, Decreased stance time - left, Ataxic, Wide base of support       General Gait Details: mild ataxia on L noted and without walker broad based gait with increased lateral sway and some ataxia on L during midswing to heel strike; used walker partway to see if improved, but pt with difficulty in narrow space of the walker and at times veering to R versus tipping walker on one side during L turns.  Used cane for another walk with cues and min A initially for sequencing with close S for majority of ambulation and some continued cueing  Stairs Stairs: Yes Stairs assistance: Supervision Stair Management: Two rails, Forwards, Step to pattern, Alternating pattern Number of Stairs: 2 General stair comments: alternating to ascend, step to using L mainly on descent  Wheelchair Mobility    Modified  Rankin (Stroke Patients Only) Modified Rankin (Stroke Patients Only) Pre-Morbid Rankin Score: Slight disability Modified Rankin: Moderately severe disability     Balance Overall  balance assessment: Needs assistance   Sitting balance-Leahy Scale: Good     Standing balance support: No upper extremity supported Standing balance-Leahy Scale: Good Standing balance comment: able to stand no UE support, balance with eyes closed 10 sec with close S                             Pertinent Vitals/Pain Pain Assessment Pain Assessment: Faces Faces Pain Scale: Hurts little more Pain Location: L knee pain chronic Pain Descriptors / Indicators: Tightness, Aching Pain Intervention(s): Monitored during session, Repositioned    Home Living Family/patient expects to be discharged to:: Private residence Living Arrangements: Children (son lives with pt, is in hospital as well) Available Help at Discharge:  (pt reports son went to mental hospital same day he was admitted) Type of Home: House Home Access: Stairs to enter Entrance Stairs-Rails: Right;Left;Can reach both Technical brewer of Steps: 5   Home Layout: One level Home Equipment: None Additional Comments: has fall alert button/necklace    Prior Function Prior Level of Function : Independent/Modified Independent               ADLs Comments: cooks and cleans, grocery shops, takes the bus     Hand Dominance   Dominant Hand: Left    Extremity/Trunk Assessment   Upper Extremity Assessment Upper Extremity Assessment: Defer to OT evaluation (L UE coordination deficits)    Lower Extremity Assessment Lower Extremity Assessment: LLE deficits/detail LLE Deficits / Details: AROM WFL, strength grossly 4/5, decreased coordination with toe tapping as compared to R LLE Coordination: decreased gross motor       Communication   Communication: No difficulties  Cognition Arousal/Alertness: Awake/alert Behavior During Therapy: WFL for tasks assessed/performed Overall Cognitive Status: Within Functional Limits for tasks assessed                                          General  Comments General comments (skin integrity, edema, etc.): Educated on fall prevention with suggestions for grabbar for shower access, importance of lighting, footwear, using device (likely cane) and slower pace, keeping items frequently used between shoulder and hip height.    Exercises     Assessment/Plan    PT Assessment Patient needs continued PT services  PT Problem List Decreased coordination;Decreased mobility;Decreased knowledge of precautions;Decreased activity tolerance;Decreased knowledge of use of DME;Decreased strength       PT Treatment Interventions DME instruction;Balance training;Gait training;Stair training;Functional mobility training;Patient/family education;Therapeutic activities;Therapeutic exercise;Neuromuscular re-education    PT Goals (Current goals can be found in the Care Plan section)  Acute Rehab PT Goals Patient Stated Goal: to return to independent PT Goal Formulation: With patient Time For Goal Achievement: 03/28/22 Potential to Achieve Goals: Good    Frequency Min 4X/week     Co-evaluation               AM-PAC PT "6 Clicks" Mobility  Outcome Measure Help needed turning from your back to your side while in a flat bed without using bedrails?: None Help needed moving from lying on your back to sitting on the side of a flat bed without using bedrails?: A Little Help needed moving to and from a bed to  a chair (including a wheelchair)?: A Little Help needed standing up from a chair using your arms (e.g., wheelchair or bedside chair)?: A Little Help needed to walk in hospital room?: A Little Help needed climbing 3-5 steps with a railing? : A Little 6 Click Score: 19    End of Session Equipment Utilized During Treatment: Gait belt Activity Tolerance: Patient tolerated treatment well Patient left: in chair;with call bell/phone within reach;with chair alarm set   PT Visit Diagnosis: Other abnormalities of gait and mobility (R26.89);Ataxic gait  (R26.0)    Time: 0940-1020 PT Time Calculation (min) (ACUTE ONLY): 40 min   Charges:   PT Evaluation $PT Eval Moderate Complexity: 1 Mod PT Treatments $Gait Training: 8-22 mins $Self Care/Home Management: 8-22        Magda Kiel, PT Acute Rehabilitation Services Office:[phone] 03/14/2022   Reginia Naas 03/14/2022, 1:00 PM

## 2022-03-14 NOTE — Research (Addendum)
PATIENT: Gavin Dennis DOB: 04-05-1951  Gavin Dennis is a 71 y.o. male who meets inclusion and exclusion criteria and may be a potential candidate for [facility] study.  A brief description of the study's purpose and duration was provided to see if the patient was interested in participating. The patient was told that study participation is voluntary, and if he choose not to participate, he will continue receiving the same treatment as any other patient.   Since the patient seemed interested in the trial, a copy of the informed consent was provided to him at  11:44 am on 03/14/22 and I said I would come back later to discuss the study in more detail after he had time to read the informed consent. He said his vision was not good enough to read at this time. So I offered to read for him if he was OK with it. He agreed so I read the information to him and I said I would give him time to think and I would come back later.  Detailed discussion  I returned at 2:40 pm today to discuss the study in detail.  I requested he ask any questions he may have as we reviewed the informed consent so I could answer them as we went along. I also read the informed consent for him and I discussed the standard-of-care treatments, appropriate alternative treatments, procedures or devices that might be helpful to the patients, the clinical trial, and each option's relative risks and benefits and alternatives. We discussed the research participant's bill of rights. Then, we also reviewed the ICF page by page and discussed their responsibilities if they choose to participate, any compensation, and what medical treatment is available if injury occurs.   Gavin Dennis was encouraged to discuss study participation with family, significant others, and primary care attending or physician to help reduce the possibility of undue influence by talking to the investigator alone.  The patient was reminded that the study participation is  voluntary, and if he choose not to participate, he will continue receiving the same care as any other patient. Additionally, if he choose to participate and later decide he want to withdraw, he will continue to be treated as any other patient.  Gavin Dennis answered questions appropriately to verbalize understanding of the informed consent.   The subject agreed to participate in the [place] trial and signed the [employer] of Rights and the informed consent at 14:55 pm today.  The informed consent was obtained prior to performance of any protocol-specific procedures for the subject.  A copy of the Research Participants [employer] of Rights and signed informed consent was given to the subject, and a copy placed in the subject's medical record.   Pt will be randomized and dispensed with the medication soon.   Rosalin Hawking, MD PhD Stroke Neurology 03/14/2022 2:57 PM  ADDENDUM - delayed entry: Initially, the pt consented to the [facility] trail yesterday early afternoon without using an imparital witness. While the pt was not cognitively impaired, he had trouble signing his name dur to weakness of his dominant hand due to his stroke, so he only wrote his initials in the signature spot, and I wrote the patient's name and date. After discussions between the clinical staff, me and Dr. Leonie Man, it was decided that since the pt could not write well due to weakness, it was better to re-consent the pt with an impartial witness present. Therefore, I returned and re-consented him later yesterday afternoon with his RN  as impartial witness present. The consent was re-done at 6:25pm on 03/14/22.   Rosalin Hawking, MD PhD Stroke Neurology 03/15/2022 11:21 AM

## 2022-03-14 NOTE — Evaluation (Signed)
Speech Language Pathology Evaluation Patient Details Name: Gavin Dennis MRN: [id number] DOB: 1950/05/09 Today's Date: 03/14/2022 Time: 2706-2376 SLP Time Calculation (min) (ACUTE ONLY): 52 min  Problem List:  Patient Active Problem List   Diagnosis Date Noted   Stroke (cerebrum) ([place]) 03/13/2022   Aneurysm of ascending aorta without rupture ([facility]) 08/26/2021   Atherosclerosis of coronary artery without angina pectoris 08/26/2021   Cervical disc disorder 08/26/2021   Essential hypertension 08/26/2021   Hyperlipidemia 08/26/2021   Hardening of the aorta (main artery of the heart) ([place]) 08/26/2021   Hypomagnesemia 08/26/2021   Other intervertebral disc degeneration, lumbar region 08/26/2021   Pulmonary emphysema ([place]) 08/26/2021   Type 2 diabetes mellitus with other specified complication ([place]) 28/31/5176   Stroke ([place]) 07/10/2020   Visual disturbance    CVA (cerebral vascular accident) ([place]) 07/09/2020   Past Medical History:  Past Medical History:  Diagnosis Date   Blurred vision, left eye    since stroke 06/2020   CVA (cerebral vascular accident) ([place]) 06/2020   DDD (degenerative disc disease), lumbar    Hyperlipidemia    Hypertension    Hypomagnesemia    Polio    childhood   Vitamin B 12 deficiency    Past Surgical History: History reviewed. No pertinent surgical history. HPI:  71 yo male with h/o CVA adm with difficulty using his left hand. Pt is left handed and resides with his son.  He has h/o CVA impacting his vision only per pt.  Pt PMH + for CVA, HLD, Vit B12, polysubstance abuse, polio in childhood.  Speech eval ordered.   Assessment / Plan / Recommendation Clinical Impression  Cognistat adminstered to pt *except visual portion* with pt scoring within normal range or 100% for all areas except memory scoring 4/8 consistent with severe deficits and calculation showing 1/4 mod impairment.  All other areas were within average range.  SLP educated pt to importance of  having someone spot check his medications, appts, etc - to prevent him from accidentally missing important events. No dysarthria or aphasia noted. Also encouraged pt to have someone go with him to appointments.  All other areas within avg range.  Orientation scoring 12/12, lang comp 6/6, repetition 12/12, naming 8/8, similarities score was 5/8 and judgement was 5/6.   Pt with eye ptosis on left - which may be baseline.  Given pt had memory deficits prior to this event, no acute follow up indicated but support advised from family.    SLP Assessment  SLP Visit Diagnosis: Cognitive communication deficit (R41.841)    Recommendations for follow up therapy are one component of a multi-disciplinary discharge planning process, led by the attending physician.  Recommendations may be updated based on patient status, additional functional criteria and insurance authorization.    Follow Up Recommendations  No SLP follow up    Assistance Recommended at Discharge  PRN  Functional Status Assessment Patient has not had a recent decline in their functional status  Frequency and Duration     N/a      SLP Evaluation Cognition  Overall Cognitive Status: No family/caregiver present to determine baseline cognitive functioning Arousal/Alertness: Awake/alert Orientation Level: Oriented X4 Year: 2023 Month: November Day of Week: Correct Attention: Sustained Sustained Attention: Appears intact Memory: Impaired Memory Impairment: Retrieval deficit;Storage deficit (recalled 3/4 word with cues, 1/4 not recalled with multiple choice cue) Awareness: Appears intact Problem Solving: Impaired (higher level problem solving - functional math- difficult for pt) Problem Solving Impairment: Verbal complex Safety/Judgment: Appears intact  Comprehension  Auditory Comprehension Overall Auditory Comprehension: Appears within functional limits for tasks assessed Yes/No Questions: Within Functional Limits Commands:  Within Functional Limits Conversation: Complex Visual Recognition/Discrimination Discrimination: Within Function Limits Reading Comprehension Reading Status: Within funtional limits (pt able to read title of "stroke" book, reports he can't read menu - has reading glasses at home but uncertain if they help)    Expression Expression Primary Mode of Expression: Verbal Verbal Expression Overall Verbal Expression: Appears within functional limits for tasks assessed Initiation: No impairment Repetition: No impairment Naming: No impairment Written Expression Dominant Hand: Left Written Expression:  (NT)   Oral / Motor  Oral Motor/Sensory Function Overall Oral Motor/Sensory Function: Within functional limits (left eye ptosis noted) Motor Speech Resonance: Within functional limits Articulation: Within functional limitis Intelligibility: Intelligible Motor Planning: Witnin functional limits            Macario Golds 03/14/2022, 11:28 AM Kathleen Lime, MS [facility] SLP Acute Rehab Services Office [phone] Pager [phone]

## 2022-03-15 ENCOUNTER — Encounter (HOSPITAL_COMMUNITY)
Admission: EM | Disposition: A | Discharge status: Home Health | Payer: Self-pay | Source: Home / Self Care | Attending: Internal Medicine

## 2022-03-15 DIAGNOSIS — Z8673 Personal history of transient ischemic attack (TIA), and cerebral infarction without residual deficits: Secondary | ICD-10-CM | POA: Diagnosis not present

## 2022-03-15 DIAGNOSIS — I63411 Cerebral infarction due to embolism of right middle cerebral artery: Secondary | ICD-10-CM | POA: Diagnosis not present

## 2022-03-15 HISTORY — PX: LOOP RECORDER INSERTION: EP1214

## 2022-03-15 LAB — GLUCOSE, CAPILLARY
Glucose-Capillary: 101 mg/dL — ABNORMAL HIGH (ref 70–99)
Glucose-Capillary: 108 mg/dL — ABNORMAL HIGH (ref 70–99)

## 2022-03-15 LAB — MAGNESIUM: Magnesium: 1.9 mg/dL (ref 1.7–2.4)

## 2022-03-15 LAB — BASIC METABOLIC PANEL
Anion gap: 6 (ref 5–15)
BUN: 19 mg/dL (ref 8–23)
CO2: 27 mmol/L (ref 22–32)
Calcium: 9.4 mg/dL (ref 8.9–10.3)
Chloride: 104 mmol/L (ref 98–111)
Creatinine, Ser: 1.07 mg/dL (ref 0.61–1.24)
GFR, Estimated: >60 mL/min (ref >60)
Glucose, Bld: 108 mg/dL — ABNORMAL HIGH (ref 70–99)
Potassium: 3.6 mmol/L (ref 3.5–5.1)
Sodium: 137 mmol/L (ref 135–145)

## 2022-03-15 SURGERY — LOOP RECORDER INSERTION

## 2022-03-15 MED ORDER — CLOPIDOGREL BISULFATE 75 MG PO TABS: 75.0000 mg | ORAL_TABLET | Freq: Every day | ORAL | 3 refills | Status: DC

## 2022-03-15 MED ORDER — LIDOCAINE-EPINEPHRINE 1 %-1:100000 IJ SOLN
INTRAMUSCULAR | Status: AC
  Filled 2022-03-15: qty 1

## 2022-03-15 MED ORDER — ASPIRIN 81 MG PO TBEC: 81.0000 mg | DELAYED_RELEASE_TABLET | Freq: Every day | ORAL | 0 refills | Status: AC

## 2022-03-15 MED ORDER — POTASSIUM CHLORIDE CRYS ER 20 MEQ PO TBCR
40.0000 meq | EXTENDED_RELEASE_TABLET | Freq: Once | ORAL | Status: AC
  Administered 2022-03-15: 40 meq via ORAL
  Filled 2022-03-15: qty 2

## 2022-03-15 MED ORDER — LIDOCAINE-EPINEPHRINE 1 %-1:100000 IJ SOLN
INTRAMUSCULAR | Status: DC | PRN
  Administered 2022-03-15: 10 mL

## 2022-03-15 MED ORDER — STUDY - OCEANIC-STROKE - ASUNDEXIAN 50 MG OR PLACEBO TABLET (PI-SETHI): 1.0000 | ORAL_TABLET | Freq: Every day | ORAL | 0 refills | Status: DC

## 2022-03-15 SURGICAL SUPPLY — 2 items
PACK LOOP INSERTION (CUSTOM PROCEDURE TRAY) ×1 IMPLANT
SYSTEM MONITOR REVEAL LINQ II (Prosthesis & Implant Heart) IMPLANT

## 2022-03-15 NOTE — Discharge Instructions (Signed)
Care After Your Loop Recorder  You have a [employer] Loop Recorder   Monitor your cardiac device site for redness, swelling, and drainage. Call the device clinic at [phone] if you experience these symptoms or fever/chills.  If you notice bleeding from your site, hold firm, but gently pressure with two fingers for 5 minutes. Dried blood on the steri-strips when removing the outer bandage is normal.   Keep the large square bandage on your site for 24 hours and then you may remove it yourself. Keep the steri-strips underneath in place.   You may shower after 72 hours / 3 days from your procedure with the steri-strips in place. They will usually fall off on their own, or may be removed after 10 days. Pat dry.   Avoid lotions, ointments, or perfumes over your incision until it is well-healed.  Please do not submerge in water until your site is completely healed.   Your device is MRI compatible.   Remote monitoring is used to monitor your cardiac device from home. This monitoring is scheduled every month by our office. It allows us to keep an eye on the function of your device to ensure it is working properly.    

## 2022-03-15 NOTE — Discharge Summary (Addendum)
[facility]  Physician Discharge Summary   Patient ID: Gavin Dennis MRN: [id number] DOB/AGE: 1951/04/19 71 y.o.  Admit date: 03/12/2022 Discharge date:   03/15/2022   PCP: Mckinley Jewel, MD  DISCHARGE DIAGNOSES:    CVA (cerebral vascular accident) ([place])   RECOMMENDATIONS FOR OUTPATIENT FOLLOW UP: Ambulatory referral sent to neurology   Home Health: PT and OT Equipment/Devices: None  CODE STATUS: Full code  DISCHARGE CONDITION: fair  Diet recommendation: Heart healthy  INITIAL HISTORY: 71 y.o. male with medical history significant of embolic stroke 7322, left eye vision loss, ascending aortic aneurysm, HTN, presented with persistent coordination problems. Patient had a embolic stroke event March 2022 when he also contracted COVID infection.  He was on aspirin and Plavix for 3 weeks and stayed on aspirin alone for antiplatelet.  TTE showed there is no ventricle thrombosis.  He has no history of A-fib either. MRI showed acute stroke in the right centrum semiovale extending to the parietal lobe without hemorrhage.   Consultants: Neurology   Procedures: Loop recorder placement is pending.   HOSPITAL COURSE:   Acute stroke Concern for embolic etiology. Patient currently on aspirin and Plavix.  Patient noted to be on statin. Neurology has recommended aspirin and Plavix for 3 weeks followed by Plavix alone. Patient has been enrolled into recent study by neurology. LDL 33, HbA1c 6.7. Echocardiogram shows normal systolic function.  No shunts identified.  No mention of any thrombus. No significant stenosis on carotid Doppler. Lower extremity Doppler ordered by neurology and is negative for DVT. Cardiology has been consulted for loop recorder placement due to concern for atrial fibrillation.  Seen by physical therapy and initially SNF was recommended.  However patient wants to go home instead.  Home health has been ordered.    Essential hypertension May resume home  medications at discharge.   Hyperlipidemia Continue statin.   Diabetes mellitus type 2 HbA1c 6.7.    Hypokalemia and hypomagnesemia Repleted.   Incidental finding of mediastinal and right hilar lymphadenopathy Noted since March of this year.  Emphysematous changes were also noted.  No other acute findings noted. Ambulatory referral has been sent to pulmonology for outpatient management.   Ascending aortic aneurysm Incidentally noted on imaging studies and echocardiogram.  Patient was seen by Dr. Kipp Brood with cardiothoracic team in April of this year.  Plan is for repeat CT scan in 1 year time.  Stable.  Okay for discharge home today after loop recorder has been placed.   PERTINENT LABS:  The results of significant diagnostics from this hospitalization (including imaging, microbiology, ancillary and laboratory) are listed below for reference.     Labs:   Basic Metabolic Panel: Recent Labs  Lab 03/12/22 1439 03/13/22 0627 03/14/22 0417 03/15/22 0636  NA 139  --  138 137  K 3.0*  --  3.2* 3.6  CL 101  --  103 104  CO2 28  --  28 27  GLUCOSE 143*  --  114* 108*  BUN 24*  --  22 19  CREATININE 1.45*  --  1.18 1.07  CALCIUM 10.5*  --  9.8 9.4  MG  --  1.3* 1.4* 1.9    CBC: Recent Labs  Lab 03/12/22 1439 03/14/22 0417  WBC 5.1 4.2  NEUTROABS 2.5  --   HGB 14.3 12.9*  HCT 42.8 38.4*  MCV 88.1 88.3  PLT 268 216     CBG: Recent Labs  Lab 03/14/22 1133 03/14/22 1555 03/14/22 2022 03/15/22 0656 03/15/22 1223  GLUCAP 116* 150* 101* 101* 108*     IMAGING STUDIES VAS [place] LOWER EXTREMITY VENOUS (DVT)  Result Date: 03/14/2022  Lower Venous DVT Study Patient Name:  Gavin Dennis  Date of Exam:   03/14/2022 Medical Rec #: [id number]     Accession #:    [id number] Date of Birth: Aug 26, 1950     Patient Gender: M Patient Age:   55 years Exam Location:  [facility] Procedure:      VAS [place] LOWER EXTREMITY VENOUS (DVT) Referring Phys: Cornelius Moras XU  --------------------------------------------------------------------------------  Indications: Stroke.  Risk Factors: None identified. Performing Technologist: Oliver Hum RVT  Examination Guidelines: A complete evaluation includes B-mode imaging, spectral Doppler, color Doppler, and power Doppler as needed of all accessible portions of each vessel. Bilateral testing is considered an integral part of a complete examination. Limited examinations for reoccurring indications may be performed as noted. The reflux portion of the exam is performed with the patient in reverse Trendelenburg.  +---------+---------------+---------+-----------+----------+-------------------+ RIGHT    CompressibilityPhasicitySpontaneityPropertiesThrombus Aging      +---------+---------------+---------+-----------+----------+-------------------+ CFV      Full           Yes      Yes                                      +---------+---------------+---------+-----------+----------+-------------------+ SFJ      Full                                                             +---------+---------------+---------+-----------+----------+-------------------+ FV Prox  Full                                                             +---------+---------------+---------+-----------+----------+-------------------+ FV Mid   Full                                                             +---------+---------------+---------+-----------+----------+-------------------+ FV DistalFull                                                             +---------+---------------+---------+-----------+----------+-------------------+ PFV      Full                                                             +---------+---------------+---------+-----------+----------+-------------------+ POP      Full           Yes      Yes                                       +---------+---------------+---------+-----------+----------+-------------------+  PTV      Full                                                             +---------+---------------+---------+-----------+----------+-------------------+ PERO                                                  Not well visualized +---------+---------------+---------+-----------+----------+-------------------+   +---------+---------------+---------+-----------+----------+--------------+ LEFT     CompressibilityPhasicitySpontaneityPropertiesThrombus Aging +---------+---------------+---------+-----------+----------+--------------+ CFV      Full           Yes      Yes                                 +---------+---------------+---------+-----------+----------+--------------+ SFJ      Full                                                        +---------+---------------+---------+-----------+----------+--------------+ FV Prox  Full                                                        +---------+---------------+---------+-----------+----------+--------------+ FV Mid   Full                                                        +---------+---------------+---------+-----------+----------+--------------+ FV DistalFull                                                        +---------+---------------+---------+-----------+----------+--------------+ PFV      Full                                                        +---------+---------------+---------+-----------+----------+--------------+ POP      Full           Yes      Yes                                 +---------+---------------+---------+-----------+----------+--------------+ PTV      Full                                                        +---------+---------------+---------+-----------+----------+--------------+  PERO     Full                                                         +---------+---------------+---------+-----------+----------+--------------+     Summary: RIGHT: - There is no evidence of deep vein thrombosis in the lower extremity. However, portions of this examination were limited- see technologist comments above.  - No cystic structure found in the popliteal fossa.  LEFT: - There is no evidence of deep vein thrombosis in the lower extremity. However, portions of this examination were limited- see technologist comments above.  - No cystic structure found in the popliteal fossa.  *See table(s) above for measurements and observations. Electronically signed by Deitra Mayo MD on 03/14/2022 at 11:13:08 PM.    Final    VAS US CAROTID  Result Date: 03/14/2022 Carotid Arterial Duplex Study Patient Name:  MAKENZIE VITTORIO  Date of Exam:   03/13/2022 Medical Rec #: [id number]     Accession #:    [id number] Date of Birth: 01-30-1951     Patient Gender: M Patient Age:   65 years Exam [facility] Procedure:      VAS US CAROTID Referring Phys: Amie Portland --------------------------------------------------------------------------------  Risk Factors:      Hypertension, hyperlipidemia, Diabetes, past history of                    smoking, prior CVA. Comparison Study:  No previous Performing Technologist: Jody Hill RVT, RDMS  Examination Guidelines: A complete evaluation includes B-mode imaging, spectral Doppler, color Doppler, and power Doppler as needed of all accessible portions of each vessel. Bilateral testing is considered an integral part of a complete examination. Limited examinations for reoccurring indications may be performed as noted.  Right Carotid Findings: +----------+--------+--------+--------+------------------+------------------+           PSV cm/sEDV cm/sStenosisPlaque DescriptionComments           +----------+--------+--------+--------+------------------+------------------+ CCA Prox  48      11                                intimal  thickening +----------+--------+--------+--------+------------------+------------------+ CCA Distal37      12                                intimal thickening +----------+--------+--------+--------+------------------+------------------+ ICA Prox  32      13                                                   +----------+--------+--------+--------+------------------+------------------+ ICA Distal36      17                                                   +----------+--------+--------+--------+------------------+------------------+ ECA       46      9                                                    +----------+--------+--------+--------+------------------+------------------+ +----------+--------+-------+----------------+-------------------+  PSV cm/sEDV cmsDescribe        Arm Pressure (mmHG) +----------+--------+-------+----------------+-------------------+ [id number]             Multiphasic, WNL                    +----------+--------+-------+----------------+-------------------+ +---------+--------+--+--------+-+---------+ VertebralPSV cm/s21EDV cm/s6Antegrade +---------+--------+--+--------+-+---------+  Left Carotid Findings: +----------+--------+--------+--------+------------------+------------------+           PSV cm/sEDV cm/sStenosisPlaque DescriptionComments           +----------+--------+--------+--------+------------------+------------------+ CCA Prox  61      14                                                   +----------+--------+--------+--------+------------------+------------------+ CCA Distal36      11                                intimal thickening +----------+--------+--------+--------+------------------+------------------+ ICA Prox  29      10                                                   +----------+--------+--------+--------+------------------+------------------+ ICA Distal39      17                                                    +----------+--------+--------+--------+------------------+------------------+ ECA       44      7                                                    +----------+--------+--------+--------+------------------+------------------+ +----------+--------+--------+----------------+-------------------+           PSV cm/sEDV cm/sDescribe        Arm Pressure (mmHG) +----------+--------+--------+----------------+-------------------+ [id number]              Multiphasic, WNL                    +----------+--------+--------+----------------+-------------------+ +---------+--------+--+--------+-+---------+ VertebralPSV cm/s18EDV cm/s4Antegrade +---------+--------+--+--------+-+---------+   Summary: Right Carotid: The extracranial vessels were near-normal with only minimal wall                thickening or plaque. Left Carotid: The extracranial vessels were near-normal with only minimal wall               thickening or plaque. Vertebrals:  Bilateral vertebral arteries demonstrate antegrade flow. Subclavians: Normal flow hemodynamics were seen in bilateral subclavian              arteries. *See table(s) above for measurements and observations.  Electronically signed by Antony Contras MD on 03/14/2022 at 12:28:04 PM.    Final    ECHOCARDIOGRAM COMPLETE  Result Date: 03/13/2022    ECHOCARDIOGRAM REPORT   Patient Name:   Della Homan Date of Exam: 03/13/2022 Medical Rec #:  [id number]    Height:       71.0 in Accession #:  [id number]   Weight:       212.8 lb Date of Birth:  [birth year]    BSA:          2.165 m Patient Age:    65 years     BP:           120/72 mmHg Patient Gender: M            HR:           60 bpm. Exam Location:  [facility] Procedure: 2D Echo, Cardiac Doppler and Color Doppler Indications:    Stroke I63.9  History:        Patient has prior history of Echocardiogram examinations, most                 recent 07/09/2020. Stroke; Risk Factors:Hypertension,                  Dyslipidemia and Diabetes.  Sonographer:    Alvino Chapel RCS Referring Phys: Lequita Halt IMPRESSIONS  1. Left ventricular ejection fraction, by estimation, is 60 to 65%. The left ventricle has normal function. The left ventricle has no regional wall motion abnormalities. There is mild left ventricular hypertrophy. Left ventricular diastolic parameters were normal.  2. Right ventricular systolic function is normal. The right ventricular size is normal. There is mildly elevated pulmonary artery systolic pressure.  3. Left atrial size was mildly dilated.  4. The mitral valve is abnormal. Trivial mitral valve regurgitation. No evidence of mitral stenosis.  5. The aortic valve is tricuspid. There is mild calcification of the aortic valve. There is mild thickening of the aortic valve. Aortic valve regurgitation is not visualized. Aortic valve sclerosis is present, with no evidence of aortic valve stenosis.  6. Aortic dilatation noted. There is moderate dilatation of the ascending aorta, measuring 40 mm.  7. The inferior vena cava is normal in size with greater than 50% respiratory variability, suggesting right atrial pressure of 3 mmHg. FINDINGS  Left Ventricle: Left ventricular ejection fraction, by estimation, is 60 to 65%. The left ventricle has normal function. The left ventricle has no regional wall motion abnormalities. The left ventricular internal cavity size was normal in size. There is  mild left ventricular hypertrophy. Left ventricular diastolic parameters were normal. Right Ventricle: The right ventricular size is normal. No increase in right ventricular wall thickness. Right ventricular systolic function is normal. There is mildly elevated pulmonary artery systolic pressure. The tricuspid regurgitant velocity is 2.89  m/s, and with an assumed right atrial pressure of 3 mmHg, the estimated right ventricular systolic pressure is 77.4 mmHg. Left Atrium: Left atrial size was mildly dilated.  Right Atrium: Right atrial size was normal in size. Pericardium: There is no evidence of pericardial effusion. Mitral Valve: The mitral valve is abnormal. There is mild thickening of the mitral valve leaflet(s). Trivial mitral valve regurgitation. No evidence of mitral valve stenosis. Tricuspid Valve: The tricuspid valve is normal in structure. Tricuspid valve regurgitation is mild . No evidence of tricuspid stenosis. Aortic Valve: The aortic valve is tricuspid. There is mild calcification of the aortic valve. There is mild thickening of the aortic valve. Aortic valve regurgitation is not visualized. Aortic valve sclerosis is present, with no evidence of aortic valve stenosis. Pulmonic Valve: The pulmonic valve was normal in structure. Pulmonic valve regurgitation is not visualized. No evidence of pulmonic stenosis. Aorta: Aortic dilatation noted. There is moderate dilatation of the ascending aorta, measuring 40 mm. Venous: The inferior vena cava  is normal in size with greater than 50% respiratory variability, suggesting right atrial pressure of 3 mmHg. IAS/Shunts: No atrial level shunt detected by color flow Doppler.  LEFT VENTRICLE PLAX 2D LVIDd:         4.50 cm   Diastology LVIDs:         2.70 cm   LV e' medial:    6.74 cm/s LV PW:         1.00 cm   LV E/e' medial:  11.1 LV IVS:        1.30 cm   LV e' lateral:   10.70 cm/s LVOT diam:     1.90 cm   LV E/e' lateral: 7.0 LV SV:         54 LV SV Index:   25 LVOT Area:     2.84 cm  RIGHT VENTRICLE RV S prime:     16.00 cm/s TAPSE (M-mode): 2.1 cm LEFT ATRIUM             Index        RIGHT ATRIUM           Index LA diam:        4.20 cm 1.94 cm/m   RA Area:     23.40 cm LA Vol (A2C):   49.1 ml 22.68 ml/m  RA Volume:   62.50 ml  28.87 ml/m LA Vol (A4C):   60.0 ml 27.72 ml/m LA Biplane Vol: 54.3 ml 25.08 ml/m  AORTIC VALVE LVOT Vmax:   95.80 cm/s LVOT Vmean:  63.900 cm/s LVOT VTI:    0.189 m  AORTA Ao Root diam: 4.00 cm MITRAL VALVE                TRICUSPID VALVE  MV Area (PHT): 1.97 cm     TR Peak grad:   33.4 mmHg MV Decel Time: 386 msec     TR Vmax:        289.00 cm/s MV E velocity: 74.90 cm/s MV A velocity: 131.00 cm/s  SHUNTS MV E/A ratio:  0.57         Systemic VTI:  0.19 m                             Systemic Diam: 1.90 cm Jenkins Rouge MD Electronically signed by Jenkins Rouge MD Signature Date/Time: 03/13/2022/1:19:48 PM    Final    MR ANGIO HEAD WO CONTRAST  Result Date: 03/13/2022 CLINICAL DATA:  Provided history: Neuro deficit, acute, stroke suspected. EXAM: MRA HEAD WITHOUT CONTRAST MRA NECK WITHOUT CONTRAST TECHNIQUE: Angiographic images of the Circle of Willis were acquired using MRA technique without intravenous contrast. Angiographic images of the neck were acquired using MRA technique without intravenous contrast. Carotid stenosis measurements (when applicable) are obtained utilizing NASCET criteria, using the distal internal carotid diameter as the denominator. COMPARISON:  Brain MRI performed earlier today 03/13/2022. CT angiogram head/neck 07/10/2020. FINDINGS: MRA HEAD FINDINGS Anterior circulation: The intracranial internal carotid arteries are patent. The M1 middle cerebral arteries are patent. No M2 proximal branch occlusion or high-grade proximal stenosis. The anterior cerebral arteries are patent. No intracranial aneurysm is identified. Posterior circulation: The intracranial vertebral arteries are patent. The basilar artery is patent. The right PCA is fetal in origin. Developmentally diminutive left P1 segment with sizable left posterior communicating artery. Anatomic variants: As described. MRA NECK FINDINGS Aortic arch: Excluded from the field of view. Right carotid system: Noncontrast technique limits  evaluation of the proximal common carotid arteries. Within this limitation, the common carotid and internal carotid arteries are patent within the neck without appreciable hemodynamically significant stenosis. Left carotid system: Noncontrast  technique limits evaluation of the proximal common carotid arteries. Within this limitation, the common carotid and internal carotid arteries are patent within the neck without appreciable hemodynamically significant stenosis. Vertebral arteries: Motion degradation and non-contrast technique preclude evaluation of the left vertebral artery V1 segment, and the very proximal right vertebral artery V1 segment. Additionally, non-contrast technique precludes evaluation of the bilateral vertebral artery V3 segments. Elsewhere, the vertebral arteries are patent within the neck without appreciable hemodynamically significant stenosis. IMPRESSION: MRA head: No intracranial large vessel occlusion or proximal high-grade arterial stenosis. MRA neck: 1. Non-contrast technique limits evaluation of the proximal common carotid arteries. Within this limitation, the common carotid and internal carotid arteries are patent within the neck without hemodynamically significant stenosis. 2. Motion degradation and non-contrast technique precludes evaluation of the left vertebral artery V1 segment, and the right vertebral artery proximal V1 segment. Additionally, non-contrast technique precludes evaluation of the bilateral vertebral artery V3 segments. Elsewhere, the vertebral arteries are patent within the neck without appreciable hemodynamically significant stenosis. Electronically Signed   By: Kellie Simmering D.O.   On: 03/13/2022 10:58   MR ANGIO NECK WO CONTRAST  Result Date: 03/13/2022 CLINICAL DATA:  Provided history: Neuro deficit, acute, stroke suspected. EXAM: MRA HEAD WITHOUT CONTRAST MRA NECK WITHOUT CONTRAST TECHNIQUE: Angiographic images of the Circle of Willis were acquired using MRA technique without intravenous contrast. Angiographic images of the neck were acquired using MRA technique without intravenous contrast. Carotid stenosis measurements (when applicable) are obtained utilizing NASCET criteria, using the distal  internal carotid diameter as the denominator. COMPARISON:  Brain MRI performed earlier today 03/13/2022. CT angiogram head/neck 07/10/2020. FINDINGS: MRA HEAD FINDINGS Anterior circulation: The intracranial internal carotid arteries are patent. The M1 middle cerebral arteries are patent. No M2 proximal branch occlusion or high-grade proximal stenosis. The anterior cerebral arteries are patent. No intracranial aneurysm is identified. Posterior circulation: The intracranial vertebral arteries are patent. The basilar artery is patent. The right PCA is fetal in origin. Developmentally diminutive left P1 segment with sizable left posterior communicating artery. Anatomic variants: As described. MRA NECK FINDINGS Aortic arch: Excluded from the field of view. Right carotid system: Noncontrast technique limits evaluation of the proximal common carotid arteries. Within this limitation, the common carotid and internal carotid arteries are patent within the neck without appreciable hemodynamically significant stenosis. Left carotid system: Noncontrast technique limits evaluation of the proximal common carotid arteries. Within this limitation, the common carotid and internal carotid arteries are patent within the neck without appreciable hemodynamically significant stenosis. Vertebral arteries: Motion degradation and non-contrast technique preclude evaluation of the left vertebral artery V1 segment, and the very proximal right vertebral artery V1 segment. Additionally, non-contrast technique precludes evaluation of the bilateral vertebral artery V3 segments. Elsewhere, the vertebral arteries are patent within the neck without appreciable hemodynamically significant stenosis. IMPRESSION: MRA head: No intracranial large vessel occlusion or proximal high-grade arterial stenosis. MRA neck: 1. Non-contrast technique limits evaluation of the proximal common carotid arteries. Within this limitation, the common carotid and internal  carotid arteries are patent within the neck without hemodynamically significant stenosis. 2. Motion degradation and non-contrast technique precludes evaluation of the left vertebral artery V1 segment, and the right vertebral artery proximal V1 segment. Additionally, non-contrast technique precludes evaluation of the bilateral vertebral artery V3 segments. Elsewhere, the vertebral arteries are  patent within the neck without appreciable hemodynamically significant stenosis. Electronically Signed   By: Kellie Simmering D.O.   On: 03/13/2022 10:58   MR BRAIN WO CONTRAST  Result Date: 03/13/2022 CLINICAL DATA:  Confusion EXAM: MRI HEAD WITHOUT CONTRAST TECHNIQUE: Multiplanar, multiecho pulse sequences of the brain and surrounding structures were obtained without intravenous contrast. COMPARISON:  CT head 1 day prior FINDINGS: Brain: There is diffusion restriction with associated FLAIR signal abnormality in the right centrum semiovale extending to the parietal lobe cortex with some involvement of the postcentral gyrus consistent with acute infarct. There is no associated hemorrhage or mass effect. There is no other evidence of acute infarct. There is no acute intracranial hemorrhage or extra-axial fluid collection. There is mild background parenchymal volume loss the ventricles are stable in size. There is extensive confluent FLAIR signal abnormality in the supratentorial white matter and pons consistent with advanced chronic small vessel ischemic change. There are small remote infarcts in the bilateral basal gangl,ia thalami, and right cerebellar hemisphere. There are scattered punctate chronic microhemorrhages in the brainstem, thalami, basal ganglia, and occipital lobe, likely hypertensive. There is no mass lesion.  There is no mass effect or midline shift. Vascular: Normal flow voids. Skull and upper cervical spine: Normal marrow signal. Sinuses/Orbits: The paranasal sinuses are clear. The globes and orbits are  unremarkable. Other: None. IMPRESSION: 1. 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 2. Small remote lacunar infarcts and background advanced chronic small vessel ischemic change. 3. Scattered punctate chronic microhemorrhages are likely hypertensive in etiology. These results were called by telephone at the time of interpretation on 03/13/2022 at 9:08 am to provider Dr Kathrynn Humble, who verbally acknowledged these results. Electronically Signed   By: Valetta Mole M.D.   On: 03/13/2022 09:08   CT Angio Chest Aorta W and/or Wo Contrast  Result Date: 03/13/2022 CLINICAL DATA:  71 year old male with stroke-like symptoms, loss of coordination, 4.5 cm fusiform ascending thoracic aortic aneurysm on lung cancer screening chest CT in March. EXAM: CT ANGIOGRAPHY CHEST WITH CONTRAST TECHNIQUE: Multidetector CT imaging of the chest was performed using the standard protocol during bolus administration of intravenous contrast. Multiplanar CT image reconstructions and MIPs were obtained to evaluate the vascular anatomy. RADIATION DOSE REDUCTION: This exam was performed according to the departmental dose-optimization program which includes automated exposure control, adjustment of the mA and/or kV according to patient size and/or use of iterative reconstruction technique. CONTRAST:  71mL OMNIPAQUE IOHEXOL 350 MG/ML SOLN COMPARISON:  Noncontrast chest CT 07/01/2021. FINDINGS: Cardiovascular: Pre contrast and postcontrast CTA images. Satisfactory aortic contrast but dominant pulmonary artery contrast timing. Stable ascending aorta size and configuration. Stable tortuosity of the arch. Calcified aortic atherosclerosis. Negative for thoracic aortic dissection. Mild mixing artifact in the distal visible aorta. Visible proximal abdominal aortic branches appear to remain patent. No pulmonary artery filling defect identified. Mild cardiomegaly which might be  eccentric rated by lower lung volumes today. No pericardial effusion. Mediastinum/Nodes: Generalized increased mediastinal and right hilar lymph nodes since March, with individual nodal tissue now 15-20 mm maximal short axis (subcentimeter in March). No discrete mediastinal mass. No axillary lymphadenopathy. Lungs/Pleura: Centrilobular emphysema. Major airways are patent. Mildly lower lung volumes with increased platelike left lower lobe atelectasis. Platelike opacity also in the right upper lobe along the minor fissure now. Stable small 3-4 mm right lung nodule series 9, image 51 since 07/01/2021. Mild bronchiectasis and architectural distortion in the left upper lobe  on series 9, image 81 is stable. No pleural effusion or acute pulmonary inflammation identified. Upper Abdomen: Negative mostly noncontrast visible liver, gallbladder, spleen, pancreas, adrenal glands in the upper abdomen. Bilateral low-density renal areas with simple fluid density are most likely benign cysts (no follow-up imaging recommended). Visible kidneys are nonobstructed. Decompressed stomach with bulky duodenal and/or proximal small bowel diverticula measuring 5.4 cm on series 7, image 138. This was partially visible in March. Otherwise negative visible bowel. Musculoskeletal: No acute or suspicious osseous lesion identified. Review of the MIP images confirms the above findings. IMPRESSION: 1. Nonspecific new Mediastinal And Right Hilar Lymphadenopathy since March this year. No explanatory underlying pneumonia or pulmonary inflammation is identified. Lymphoproliferative disorder or metastatic disease cannot be excluded. 2. But chronic lung disease with Emphysema (ICD10-J43.9) appears stable from Low-dose Screening Chest CT 07/01/2021 except for new plate-like atelectasis. Recommend repeat low-dose chest CT without contrast (please use the following order, "CT CHEST LCS NODULE FOLLOW-UP W/O CM" ). 3. Negative for thoracic aortic dissection. No  evidence of pulmonary embolus stable Ascending thoracic aortic 4.5 cm aneurysm. Ascending thoracic. Aortic aneurysm. Recommend semi-annual imaging followup by CTA or MRA and referral to cardiothoracic surgery if not already obtained. This recommendation follows 2010 ACCF/AHA/AATS/ACR/ASA/SCA/SCAI/SIR/STS/SVM Guidelines for the diagnosis and Management of Patients With Thoracic Aortic Disease. Circulaton. 2010; 121: G182-X937. Aortic aneurysm NOS (ICD10-I71.9). Aortic Atherosclerosis (ICD10-I70.0) Electronically Signed   By: Genevie Ann M.D.   On: 03/13/2022 07:52   DG Chest 2 View  Result Date: 03/13/2022 CLINICAL DATA:  71 year old male with history of stroke-like symptoms. Loss of coordination. EXAM: CHEST - 2 VIEW COMPARISON:  Chest x-ray 06/04/2020. FINDINGS: Linear opacities in the left base and right mid to lower lung have an appearance of platelike atelectasis or chronic scarring on today's examination, but are new compared to the prior study. No confluent consolidative airspace disease. No pleural effusions. No pneumothorax. No definite suspicious appearing pulmonary nodules or masses are noted. No evidence of pulmonary edema. Heart size is normal. Atherosclerotic calcifications are noted within the tortuous thoracic aorta. IMPRESSION: 1. Increased subsegmental atelectasis and/or scarring in the lower lungs. 2. Aortic atherosclerosis. Electronically Signed   By: Vinnie Langton M.D.   On: 03/13/2022 06:00   CT HEAD WO CONTRAST (5MM)  Result Date: 03/12/2022 CLINICAL DATA:  Delirium.  Confusion. EXAM: CT HEAD WITHOUT CONTRAST TECHNIQUE: Contiguous axial images were obtained from the base of the skull through the vertex without intravenous contrast. RADIATION DOSE REDUCTION: This exam was performed according to the departmental dose-optimization program which includes automated exposure control, adjustment of the mA and/or kV according to patient size and/or use of iterative reconstruction technique.  COMPARISON:  CT examination dated July 10, 2020 FINDINGS: Brain: No evidence of acute infarction, hemorrhage, hydrocephalus, extra-axial collection or mass lesion/mass effect. Diffuse low-attenuation of the periventricular and subcortical white matter presumed advanced chronic microvascular ischemic changes. Mild cerebral atrophy. Vascular: No hyperdense vessel or unexpected calcification. Skull: Normal. Negative for fracture or focal lesion. Sinuses/Orbits: No acute finding. Other: None. IMPRESSION: 1. No acute intracranial abnormality. 2. Advanced chronic microvascular ischemic changes of the white matter and mild cerebral atrophy. Electronically Signed   By: Keane Police D.O.   On: 03/12/2022 16:04    DISCHARGE EXAMINATION: See progress note from earlier today  DISPOSITION: Home  Discharge Instructions     Ambulatory referral to Neurology   Complete by: As directed    Follow up with stroke clinic NP [facility], if not available, consider Leonie Man,  [place], or [place]) at [facility] in about 4 weeks. Thanks.   Ambulatory referral to Pulmonology   Complete by: As directed    Mediastinal and right hilar lymphadenopathy   Reason for referral: Lung Mass/Lung Nodule   Call MD for:  difficulty breathing, headache or visual disturbances   Complete by: As directed    Call MD for:  extreme fatigue   Complete by: As directed    Call MD for:  persistant dizziness or light-headedness   Complete by: As directed    Call MD for:  persistant nausea and vomiting   Complete by: As directed    Call MD for:  severe uncontrolled pain   Complete by: As directed    Call MD for:  temperature >100.4   Complete by: As directed    Diet - low sodium heart healthy   Complete by: As directed    Discharge instructions   Complete by: As directed    Please take your medications as prescribed.  Please be sure to follow-up with your primary care provider within 1 week.  You were cared for by a hospitalist during your  hospital stay. If you have any questions about your discharge medications or the care you received while you were in the hospital after you are discharged, you can call the unit and asked to speak with the hospitalist on call if the hospitalist that took care of you is not available. Once you are discharged, your primary care physician will handle any further medical issues. Please note that NO REFILLS for any discharge medications will be authorized once you are discharged, as it is imperative that you return to your primary care physician (or establish a relationship with a primary care physician if you do not have one) for your aftercare needs so that they can reassess your need for medications and monitor your lab values. If you do not have a primary care physician, you can call [phone] for a physician referral.   Increase activity slowly   Complete by: As directed           Allergies as of 03/15/2022   No Known Allergies      Medication List     STOP taking these medications    hydrochlorothiazide 25 MG tablet Commonly known as: HYDRODIURIL   ibuprofen 600 MG tablet Commonly known as: ADVIL       TAKE these medications    amLODipine 10 MG tablet Commonly known as: NORVASC Take 1 tablet (10 mg total) by mouth daily.   aspirin EC 81 MG tablet Take 1 tablet (81 mg total) by mouth daily for 21 days. Swallow whole.   atorvastatin 40 MG tablet Commonly known as: LIPITOR Take 1 tablet (40 mg total) by mouth daily.   carvedilol 6.25 MG tablet Commonly known as: COREG Take 1 tablet (6.25 mg total) by mouth 2 (two) times daily.   clopidogrel 75 MG tablet Commonly known as: PLAVIX Take 1 tablet (75 mg total) by mouth daily. Start taking on: March 16, 2022   metFORMIN 500 MG tablet Commonly known as: GLUCOPHAGE Take 500 mg by mouth daily.   OCEANIC-STROKE asundexian or placebo 50 mg tablet Take 1 tablet (50 mg total) by mouth daily. For Investigational Use Only.  Take at the same time each day (preferably in the morning). Tablet should be swallowed whole with water; it CANNOT be crushed or broken. Start taking on: March 16, 2022   vitamin B-12 500 MCG tablet Commonly known as:  CYANOCOBALAMIN Take 500 mcg by mouth daily.          Follow-up Information     Care, [facility] Follow up.   Specialty: Home Health Services Why: The home health agency will contact you for the first home visit. Contact information: [place] [address] [phone]         Mckinley Jewel, MD. Schedule an appointment as soon as possible for a visit in 1 week(s).   Specialty: Internal Medicine Why: post hospitalization follow up Contact information: [address] [place] [address] [phone]         [facility]. Schedule an appointment as soon as possible for a visit in 1 month(s).   Specialty: Neurology Why: stroke clinic Contact information: [place] [phone]                TOTAL DISCHARGE TIME: 35 mins  [place]  [employer] operational officer on [email or web address]  03/15/2022, 3:22 PM

## 2022-03-15 NOTE — Plan of Care (Signed)
Problem: Education: Goal: Ability to describe self-care measures that may prevent or decrease complications (Diabetes Survival Skills Education) will improve Outcome: Adequate for Discharge Goal: Individualized Educational Video(s) Outcome: Adequate for Discharge   Problem: Coping: Goal: Ability to adjust to condition or change in health will improve Outcome: Adequate for Discharge   Problem: Fluid Volume: Goal: Ability to maintain a balanced intake and output will improve Outcome: Adequate for Discharge   Problem: Health Behavior/Discharge Planning: Goal: Ability to identify and utilize available resources and services will improve Outcome: Adequate for Discharge Goal: Ability to manage health-related needs will improve Outcome: Adequate for Discharge   Problem: Metabolic: Goal: Ability to maintain appropriate glucose levels will improve Outcome: Adequate for Discharge   Problem: Nutritional: Goal: Maintenance of adequate nutrition will improve Outcome: Adequate for Discharge Goal: Progress toward achieving an optimal weight will improve Outcome: Adequate for Discharge   Problem: Skin Integrity: Goal: Risk for impaired skin integrity will decrease Outcome: Adequate for Discharge   Problem: Tissue Perfusion: Goal: Adequacy of tissue perfusion will improve Outcome: Adequate for Discharge   Problem: Education: Goal: Knowledge of disease or condition will improve 03/15/2022 1650 by Sherlynn Carbon, RN Outcome: Adequate for Discharge 03/15/2022 1013 by Sherlynn Carbon, RN Outcome: Progressing Goal: Knowledge of secondary prevention will improve (MUST DOCUMENT ALL) Outcome: Adequate for Discharge Goal: Knowledge of patient specific risk factors will improve Elta [place] N/A or DELETE if not current risk factor) Outcome: Adequate for Discharge   Problem: Ischemic Stroke/TIA Tissue Perfusion: Goal: Complications of ischemic stroke/TIA will be minimized Outcome: Adequate for  Discharge   Problem: Coping: Goal: Will verbalize positive feelings about self 03/15/2022 1650 by Sherlynn Carbon, RN Outcome: Adequate for Discharge 03/15/2022 1013 by Sherlynn Carbon, RN Outcome: Progressing Goal: Will identify appropriate support needs 03/15/2022 1650 by Sherlynn Carbon, RN Outcome: Adequate for Discharge 03/15/2022 1013 by Sherlynn Carbon, RN Outcome: Progressing   Problem: Health Behavior/Discharge Planning: Goal: Ability to manage health-related needs will improve Outcome: Adequate for Discharge Goal: Goals will be collaboratively established with patient/family Outcome: Adequate for Discharge   Problem: Self-Care: Goal: Ability to participate in self-care as condition permits will improve 03/15/2022 1650 by Sherlynn Carbon, RN Outcome: Adequate for Discharge 03/15/2022 1013 by Sherlynn Carbon, RN Outcome: Progressing Goal: Verbalization of feelings and concerns over difficulty with self-care will improve Outcome: Adequate for Discharge Goal: Ability to communicate needs accurately will improve 03/15/2022 1650 by Sherlynn Carbon, RN Outcome: Adequate for Discharge 03/15/2022 1013 by Sherlynn Carbon, RN Outcome: Progressing   Problem: Nutrition: Goal: Risk of aspiration will decrease 03/15/2022 1650 by Sherlynn Carbon, RN Outcome: Adequate for Discharge 03/15/2022 1013 by Sherlynn Carbon, RN Outcome: Progressing Goal: Dietary intake will improve 03/15/2022 1650 by Sherlynn Carbon, RN Outcome: Adequate for Discharge 03/15/2022 1013 by Sherlynn Carbon, RN Outcome: Progressing   Problem: Education: Goal: Knowledge of General Education information will improve Description: Including pain rating scale, medication(s)/side effects and non-pharmacologic comfort measures Outcome: Adequate for Discharge   Problem: Health Behavior/Discharge Planning: Goal: Ability to manage health-related needs will improve Outcome: Adequate for Discharge    Problem: Clinical Measurements: Goal: Ability to maintain clinical measurements within normal limits will improve Outcome: Adequate for Discharge Goal: Will remain free from infection Outcome: Adequate for Discharge Goal: Diagnostic test results will improve Outcome: Adequate for Discharge Goal: Respiratory complications will improve Outcome: Adequate for Discharge Goal: Cardiovascular complication will be avoided Outcome: Adequate for Discharge  Problem: Activity: Goal: Risk for activity intolerance will decrease Outcome: Adequate for Discharge   Problem: Nutrition: Goal: Adequate nutrition will be maintained Outcome: Adequate for Discharge   Problem: Coping: Goal: Level of anxiety will decrease Outcome: Adequate for Discharge   Problem: Elimination: Goal: Will not experience complications related to bowel motility Outcome: Adequate for Discharge Goal: Will not experience complications related to urinary retention Outcome: Adequate for Discharge   Problem: Pain Managment: Goal: General experience of comfort will improve Outcome: Adequate for Discharge   Problem: Safety: Goal: Ability to remain free from injury will improve Outcome: Adequate for Discharge   Problem: Skin Integrity: Goal: Risk for impaired skin integrity will decrease Outcome: Adequate for Discharge

## 2022-03-15 NOTE — Progress Notes (Signed)
[facility] PROGRESS NOTE   Gavin Dennis YJE:[id number] DOB: 1950-06-01 DOA: 03/12/2022  PCP: Mckinley Jewel, MD  Brief History/Interval Summary: 71 y.o. male with medical history significant of embolic stroke 6378, left eye vision loss, ascending aortic aneurysm, HTN, presented with persistent coordination problems. Patient had a embolic stroke event March 2022 when he also contracted COVID infection.  He was on aspirin and Plavix for 3 weeks and stayed on aspirin alone for antiplatelet.  TTE showed there is no ventricle thrombosis.  He has no history of A-fib either. MRI showed acute stroke in the right centrum semiovale extending to the parietal lobe without hemorrhage.  Consultants: Neurology  Procedures: Loop recorder placement is pending.    Subjective/Interval History: Patient mentions that the left-sided weakness is improving.  No headaches.  No nausea vomiting.     Assessment/Plan:  Acute stroke Concern for embolic etiology. Patient currently on aspirin and Plavix.  Patient noted to be on statin. Neurology has recommended aspirin and Plavix for 3 weeks followed by Plavix alone. Patient has been enrolled into recent study by neurology. LDL 33, HbA1c 6.7. Echocardiogram shows normal systolic function.  No shunts identified.  No mention of any thrombus. No significant stenosis on carotid Doppler. Lower extremity Doppler ordered by neurology and is negative for DVT. Cardiology has been consulted for loop recorder placement due to concern for atrial fibrillation.  Seen by physical therapy and skilled nursing facility is recommended.  Essential hypertension Permissive hypertension was allowed.  Blood pressure is reasonably well controlled.  Hyperlipidemia Continue statin.  Diabetes mellitus type 2 Metformin on hold.  HbA1c 6.7.  Monitor CBGs.  Hypokalemia and hypomagnesemia Repleted.  Incidental finding of mediastinal and right hilar lymphadenopathy Noted  since March of this year.  Emphysematous changes were also noted.  No other acute findings noted. Ambulatory referral has been sent to pulmonology for outpatient management.  Ascending aortic aneurysm Incidentally noted on imaging studies and echocardiogram.  Patient was seen by Dr. Kipp Brood with cardiothoracic team in April of this year.  Plan is for repeat CT scan in 1 year time.   DVT Prophylaxis: Lovenox Code Status: Full code Family Communication: Discussed with patient Disposition Plan: Skilled nursing facility.  TOC has been consulted.  Status is: Inpatient Remains inpatient appropriate because: Acute stroke    Medications: Scheduled:  aspirin EC  81 mg Oral Daily   atorvastatin  40 mg Oral Daily   clopidogrel  75 mg Oral Daily   insulin aspart  0-9 Units Subcutaneous TID WC   OCEANIC-STROKE asundexian or placebo  1 tablet Oral Daily   Continuous: HYI:FOYDXAJOINOMV **OR** acetaminophen (TYLENOL) oral liquid 160 mg/5 mL **OR** acetaminophen, hydrALAZINE, LORazepam, senna-docusate  Antibiotics: Anti-infectives (From admission, onward)    None       Objective:  Vital Signs  Vitals:   03/14/22 1117 03/14/22 1519 03/14/22 2020 03/15/22 0315  BP: 109/88 118/76 121/89 132/89  Pulse: 64 75 68 74  Resp: 16 18 16 16   Temp: 97.6 F (36.4 C) 97.9 F (36.6 C) 98 F (36.7 C)   TempSrc:   Oral   SpO2: 97% 98% 100% 94%  Height:       No intake or output data in the 24 hours ending 03/15/22 0917  There were no vitals filed for this visit.   General appearance: Awake alert.  In no distress Resp: Clear to auscultation bilaterally.  Normal effort Cardio: S1-S2 is normal regular.  No S3-S4.  No rubs murmurs or bruit  GI: Abdomen is soft.  Nontender nondistended.  Bowel sounds are present normal.  No masses organomegaly Extremities: No edema.  Moving all of his extremities   Lab Results:  Data Reviewed: I have personally reviewed following labs and reports of the  imaging studies  CBC: Recent Labs  Lab 03/12/22 1439 03/14/22 0417  WBC 5.1 4.2  NEUTROABS 2.5  --   HGB 14.3 12.9*  HCT 42.8 38.4*  MCV 88.1 88.3  PLT 268 216     Basic Metabolic Panel: Recent Labs  Lab 03/12/22 1439 03/13/22 0627 03/14/22 0417 03/15/22 0636  NA 139  --  138 137  K 3.0*  --  3.2* 3.6  CL 101  --  103 104  CO2 28  --  28 27  GLUCOSE 143*  --  114* 108*  BUN 24*  --  22 19  CREATININE 1.45*  --  1.18 1.07  CALCIUM 10.5*  --  9.8 9.4  MG  --  1.3* 1.4* 1.9     GFR: Estimated Creatinine Clearance: 76.1 mL/min (by C-G formula based on SCr of 1.07 mg/dL).   HbA1C: Recent Labs    03/13/22 1125 03/13/22 2051  HGBA1C 6.7* 6.7*     CBG: Recent Labs  Lab 03/14/22 0724 03/14/22 1133 03/14/22 1555 03/14/22 2022 03/15/22 0656  GLUCAP 97 116* 150* 101* 101*     Lipid Profile: Recent Labs    03/14/22 0417  CHOL 66  HDL 23*  LDLCALC 33  TRIG 48  CHOLHDL 2.9      Radiology Studies: VAS [place] LOWER EXTREMITY VENOUS (DVT)  Result Date: 03/14/2022  Lower Venous DVT Study Patient Name:  Gavin Dennis  Date of Exam:   03/14/2022 Medical Rec #: [id number]     Accession #:    [id number] Date of Birth: Aug 31, 1950     Patient Gender: M Patient Age:   71 years Exam Location:  [facility] Procedure:      VAS [place] LOWER EXTREMITY VENOUS (DVT) Referring Phys: Cornelius Moras XU --------------------------------------------------------------------------------  Indications: Stroke.  Risk Factors: None identified. Performing Technologist: Oliver Hum RVT  Examination Guidelines: A complete evaluation includes B-mode imaging, spectral Doppler, color Doppler, and power Doppler as needed of all accessible portions of each vessel. Bilateral testing is considered an integral part of a complete examination. Limited examinations for reoccurring indications may be performed as noted. The reflux portion of the exam is performed with the patient in reverse Trendelenburg.   +---------+---------------+---------+-----------+----------+-------------------+ RIGHT    CompressibilityPhasicitySpontaneityPropertiesThrombus Aging      +---------+---------------+---------+-----------+----------+-------------------+ CFV      Full           Yes      Yes                                      +---------+---------------+---------+-----------+----------+-------------------+ SFJ      Full                                                             +---------+---------------+---------+-----------+----------+-------------------+ FV Prox  Full                                                             +---------+---------------+---------+-----------+----------+-------------------+  FV Mid   Full                                                             +---------+---------------+---------+-----------+----------+-------------------+ FV DistalFull                                                             +---------+---------------+---------+-----------+----------+-------------------+ PFV      Full                                                             +---------+---------------+---------+-----------+----------+-------------------+ POP      Full           Yes      Yes                                      +---------+---------------+---------+-----------+----------+-------------------+ PTV      Full                                                             +---------+---------------+---------+-----------+----------+-------------------+ PERO                                                  Not well visualized +---------+---------------+---------+-----------+----------+-------------------+   +---------+---------------+---------+-----------+----------+--------------+ LEFT     CompressibilityPhasicitySpontaneityPropertiesThrombus Aging +---------+---------------+---------+-----------+----------+--------------+ CFV      Full            Yes      Yes                                 +---------+---------------+---------+-----------+----------+--------------+ SFJ      Full                                                        +---------+---------------+---------+-----------+----------+--------------+ FV Prox  Full                                                        +---------+---------------+---------+-----------+----------+--------------+ FV Mid   Full                                                        +---------+---------------+---------+-----------+----------+--------------+  FV DistalFull                                                        +---------+---------------+---------+-----------+----------+--------------+ PFV      Full                                                        +---------+---------------+---------+-----------+----------+--------------+ POP      Full           Yes      Yes                                 +---------+---------------+---------+-----------+----------+--------------+ PTV      Full                                                        +---------+---------------+---------+-----------+----------+--------------+ PERO     Full                                                        +---------+---------------+---------+-----------+----------+--------------+     Summary: RIGHT: - There is no evidence of deep vein thrombosis in the lower extremity. However, portions of this examination were limited- see technologist comments above.  - No cystic structure found in the popliteal fossa.  LEFT: - There is no evidence of deep vein thrombosis in the lower extremity. However, portions of this examination were limited- see technologist comments above.  - No cystic structure found in the popliteal fossa.  *See table(s) above for measurements and observations. Electronically signed by Deitra Mayo MD on 03/14/2022 at 11:13:08 PM.    Final    VAS US  CAROTID  Result Date: 03/14/2022 Carotid Arterial Duplex Study Patient Name:  Gavin Dennis  Date of Exam:   03/13/2022 Medical Rec #: [id number]     Accession #:    [id number] Date of Birth: Mar 05, 1951     Patient Gender: M Patient Age:   32 years Exam Location:  [facility] Procedure:      VAS US CAROTID Referring Phys: Amie Portland --------------------------------------------------------------------------------  Risk Factors:      Hypertension, hyperlipidemia, Diabetes, past history of                    smoking, prior CVA. Comparison Study:  No previous Performing Technologist: Jody Hill RVT, RDMS  Examination Guidelines: A complete evaluation includes B-mode imaging, spectral Doppler, color Doppler, and power Doppler as needed of all accessible portions of each vessel. Bilateral testing is considered an integral part of a complete examination. Limited examinations for reoccurring indications may be performed as noted.  Right Carotid Findings: +----------+--------+--------+--------+------------------+------------------+           PSV cm/sEDV cm/sStenosisPlaque DescriptionComments           +----------+--------+--------+--------+------------------+------------------+  CCA Prox  48      11                                intimal thickening +----------+--------+--------+--------+------------------+------------------+ CCA Distal37      12                                intimal thickening +----------+--------+--------+--------+------------------+------------------+ ICA Prox  32      13                                                   +----------+--------+--------+--------+------------------+------------------+ ICA Distal36      17                                                   +----------+--------+--------+--------+------------------+------------------+ ECA       46      9                                                     +----------+--------+--------+--------+------------------+------------------+ +----------+--------+-------+----------------+-------------------+           PSV cm/sEDV cmsDescribe        Arm Pressure (mmHG) +----------+--------+-------+----------------+-------------------+ [id number]             Multiphasic, WNL                    +----------+--------+-------+----------------+-------------------+ +---------+--------+--+--------+-+---------+ VertebralPSV cm/s21EDV cm/s6Antegrade +---------+--------+--+--------+-+---------+  Left Carotid Findings: +----------+--------+--------+--------+------------------+------------------+           PSV cm/sEDV cm/sStenosisPlaque DescriptionComments           +----------+--------+--------+--------+------------------+------------------+ CCA Prox  61      14                                                   +----------+--------+--------+--------+------------------+------------------+ CCA Distal36      11                                intimal thickening +----------+--------+--------+--------+------------------+------------------+ ICA Prox  29      10                                                   +----------+--------+--------+--------+------------------+------------------+ ICA Distal39      17                                                   +----------+--------+--------+--------+------------------+------------------+ ECA       44  7                                                    +----------+--------+--------+--------+------------------+------------------+ +----------+--------+--------+----------------+-------------------+           PSV cm/sEDV cm/sDescribe        Arm Pressure (mmHG) +----------+--------+--------+----------------+-------------------+ [id number]              Multiphasic, WNL                    +----------+--------+--------+----------------+-------------------+  +---------+--------+--+--------+-+---------+ VertebralPSV cm/s18EDV cm/s4Antegrade +---------+--------+--+--------+-+---------+   Summary: Right Carotid: The extracranial vessels were near-normal with only minimal wall                thickening or plaque. Left Carotid: The extracranial vessels were near-normal with only minimal wall               thickening or plaque. Vertebrals:  Bilateral vertebral arteries demonstrate antegrade flow. Subclavians: Normal flow hemodynamics were seen in bilateral subclavian              arteries. *See table(s) above for measurements and observations.  Electronically signed by Antony Contras MD on 03/14/2022 at 12:28:04 PM.    Final    ECHOCARDIOGRAM COMPLETE  Result Date: 03/13/2022    ECHOCARDIOGRAM REPORT   Patient Name:   Gavin Dennis Date of Exam: 03/13/2022 Medical Rec #:  [id number]    Height:       71.0 in Accession #:    [id number]   Weight:       212.8 lb Date of Birth:  January 02, 1951    BSA:          2.165 m Patient Age:    34 years     BP:           120/72 mmHg Patient Gender: M            HR:           60 bpm. Exam Location:  [facility] Procedure: 2D Echo, Cardiac Doppler and Color Doppler Indications:    Stroke I63.9  History:        Patient has prior history of Echocardiogram examinations, most                 recent 07/09/2020. Stroke; Risk Factors:Hypertension,                 Dyslipidemia and Diabetes.  Sonographer:    Alvino Chapel RCS Referring Phys: Lequita Halt IMPRESSIONS  1. Left ventricular ejection fraction, by estimation, is 60 to 65%. The left ventricle has normal function. The left ventricle has no regional wall motion abnormalities. There is mild left ventricular hypertrophy. Left ventricular diastolic parameters were normal.  2. Right ventricular systolic function is normal. The right ventricular size is normal. There is mildly elevated pulmonary artery systolic pressure.  3. Left atrial size was mildly dilated.  4. The mitral valve is abnormal.  Trivial mitral valve regurgitation. No evidence of mitral stenosis.  5. The aortic valve is tricuspid. There is mild calcification of the aortic valve. There is mild thickening of the aortic valve. Aortic valve regurgitation is not visualized. Aortic valve sclerosis is present, with no evidence of aortic valve stenosis.  6. Aortic dilatation noted. There is moderate dilatation of the ascending aorta, measuring 40 mm.  7.  The inferior vena cava is normal in size with greater than 50% respiratory variability, suggesting right atrial pressure of 3 mmHg. FINDINGS  Left Ventricle: Left ventricular ejection fraction, by estimation, is 60 to 65%. The left ventricle has normal function. The left ventricle has no regional wall motion abnormalities. The left ventricular internal cavity size was normal in size. There is  mild left ventricular hypertrophy. Left ventricular diastolic parameters were normal. Right Ventricle: The right ventricular size is normal. No increase in right ventricular wall thickness. Right ventricular systolic function is normal. There is mildly elevated pulmonary artery systolic pressure. The tricuspid regurgitant velocity is 2.89  m/s, and with an assumed right atrial pressure of 3 mmHg, the estimated right ventricular systolic pressure is 15.1 mmHg. Left Atrium: Left atrial size was mildly dilated. Right Atrium: Right atrial size was normal in size. Pericardium: There is no evidence of pericardial effusion. Mitral Valve: The mitral valve is abnormal. There is mild thickening of the mitral valve leaflet(s). Trivial mitral valve regurgitation. No evidence of mitral valve stenosis. Tricuspid Valve: The tricuspid valve is normal in structure. Tricuspid valve regurgitation is mild . No evidence of tricuspid stenosis. Aortic Valve: The aortic valve is tricuspid. There is mild calcification of the aortic valve. There is mild thickening of the aortic valve. Aortic valve regurgitation is not visualized.  Aortic valve sclerosis is present, with no evidence of aortic valve stenosis. Pulmonic Valve: The pulmonic valve was normal in structure. Pulmonic valve regurgitation is not visualized. No evidence of pulmonic stenosis. Aorta: Aortic dilatation noted. There is moderate dilatation of the ascending aorta, measuring 40 mm. Venous: The inferior vena cava is normal in size with greater than 50% respiratory variability, suggesting right atrial pressure of 3 mmHg. IAS/Shunts: No atrial level shunt detected by color flow Doppler.  LEFT VENTRICLE PLAX 2D LVIDd:         4.50 cm   Diastology LVIDs:         2.70 cm   LV e' medial:    6.74 cm/s LV PW:         1.00 cm   LV E/e' medial:  11.1 LV IVS:        1.30 cm   LV e' lateral:   10.70 cm/s LVOT diam:     1.90 cm   LV E/e' lateral: 7.0 LV SV:         54 LV SV Index:   25 LVOT Area:     2.84 cm  RIGHT VENTRICLE RV S prime:     16.00 cm/s TAPSE (M-mode): 2.1 cm LEFT ATRIUM             Index        RIGHT ATRIUM           Index LA diam:        4.20 cm 1.94 cm/m   RA Area:     23.40 cm LA Vol (A2C):   49.1 ml 22.68 ml/m  RA Volume:   62.50 ml  28.87 ml/m LA Vol (A4C):   60.0 ml 27.72 ml/m LA Biplane Vol: 54.3 ml 25.08 ml/m  AORTIC VALVE LVOT Vmax:   95.80 cm/s LVOT Vmean:  63.900 cm/s LVOT VTI:    0.189 m  AORTA Ao Root diam: 4.00 cm MITRAL VALVE                TRICUSPID VALVE MV Area (PHT): 1.97 cm     TR Peak grad:   33.4 mmHg MV  Decel Time: 386 msec     TR Vmax:        289.00 cm/s MV E velocity: 74.90 cm/s MV A velocity: 131.00 cm/s  SHUNTS MV E/A ratio:  0.57         Systemic VTI:  0.19 m                             Systemic Diam: 1.90 cm Jenkins Rouge MD Electronically signed by Jenkins Rouge MD Signature Date/Time: 03/13/2022/1:19:48 PM    Final    MR ANGIO HEAD WO CONTRAST  Result Date: 03/13/2022 CLINICAL DATA:  Provided history: Neuro deficit, acute, stroke suspected. EXAM: MRA HEAD WITHOUT CONTRAST MRA NECK WITHOUT CONTRAST TECHNIQUE: Angiographic images of the  Circle of Willis were acquired using MRA technique without intravenous contrast. Angiographic images of the neck were acquired using MRA technique without intravenous contrast. Carotid stenosis measurements (when applicable) are obtained utilizing NASCET criteria, using the distal internal carotid diameter as the denominator. COMPARISON:  Brain MRI performed earlier today 03/13/2022. CT angiogram head/neck 07/10/2020. FINDINGS: MRA HEAD FINDINGS Anterior circulation: The intracranial internal carotid arteries are patent. The M1 middle cerebral arteries are patent. No M2 proximal branch occlusion or high-grade proximal stenosis. The anterior cerebral arteries are patent. No intracranial aneurysm is identified. Posterior circulation: The intracranial vertebral arteries are patent. The basilar artery is patent. The right PCA is fetal in origin. Developmentally diminutive left P1 segment with sizable left posterior communicating artery. Anatomic variants: As described. MRA NECK FINDINGS Aortic arch: Excluded from the field of view. Right carotid system: Noncontrast technique limits evaluation of the proximal common carotid arteries. Within this limitation, the common carotid and internal carotid arteries are patent within the neck without appreciable hemodynamically significant stenosis. Left carotid system: Noncontrast technique limits evaluation of the proximal common carotid arteries. Within this limitation, the common carotid and internal carotid arteries are patent within the neck without appreciable hemodynamically significant stenosis. Vertebral arteries: Motion degradation and non-contrast technique preclude evaluation of the left vertebral artery V1 segment, and the very proximal right vertebral artery V1 segment. Additionally, non-contrast technique precludes evaluation of the bilateral vertebral artery V3 segments. Elsewhere, the vertebral arteries are patent within the neck without appreciable  hemodynamically significant stenosis. IMPRESSION: MRA head: No intracranial large vessel occlusion or proximal high-grade arterial stenosis. MRA neck: 1. Non-contrast technique limits evaluation of the proximal common carotid arteries. Within this limitation, the common carotid and internal carotid arteries are patent within the neck without hemodynamically significant stenosis. 2. Motion degradation and non-contrast technique precludes evaluation of the left vertebral artery V1 segment, and the right vertebral artery proximal V1 segment. Additionally, non-contrast technique precludes evaluation of the bilateral vertebral artery V3 segments. Elsewhere, the vertebral arteries are patent within the neck without appreciable hemodynamically significant stenosis. Electronically Signed   By: Kellie Simmering D.O.   On: 03/13/2022 10:58   MR ANGIO NECK WO CONTRAST  Result Date: 03/13/2022 CLINICAL DATA:  Provided history: Neuro deficit, acute, stroke suspected. EXAM: MRA HEAD WITHOUT CONTRAST MRA NECK WITHOUT CONTRAST TECHNIQUE: Angiographic images of the Circle of Willis were acquired using MRA technique without intravenous contrast. Angiographic images of the neck were acquired using MRA technique without intravenous contrast. Carotid stenosis measurements (when applicable) are obtained utilizing NASCET criteria, using the distal internal carotid diameter as the denominator. COMPARISON:  Brain MRI performed earlier today 03/13/2022. CT angiogram head/neck 07/10/2020. FINDINGS: MRA HEAD FINDINGS Anterior circulation: The intracranial  internal carotid arteries are patent. The M1 middle cerebral arteries are patent. No M2 proximal branch occlusion or high-grade proximal stenosis. The anterior cerebral arteries are patent. No intracranial aneurysm is identified. Posterior circulation: The intracranial vertebral arteries are patent. The basilar artery is patent. The right PCA is fetal in origin. Developmentally diminutive  left P1 segment with sizable left posterior communicating artery. Anatomic variants: As described. MRA NECK FINDINGS Aortic arch: Excluded from the field of view. Right carotid system: Noncontrast technique limits evaluation of the proximal common carotid arteries. Within this limitation, the common carotid and internal carotid arteries are patent within the neck without appreciable hemodynamically significant stenosis. Left carotid system: Noncontrast technique limits evaluation of the proximal common carotid arteries. Within this limitation, the common carotid and internal carotid arteries are patent within the neck without appreciable hemodynamically significant stenosis. Vertebral arteries: Motion degradation and non-contrast technique preclude evaluation of the left vertebral artery V1 segment, and the very proximal right vertebral artery V1 segment. Additionally, non-contrast technique precludes evaluation of the bilateral vertebral artery V3 segments. Elsewhere, the vertebral arteries are patent within the neck without appreciable hemodynamically significant stenosis. IMPRESSION: MRA head: No intracranial large vessel occlusion or proximal high-grade arterial stenosis. MRA neck: 1. Non-contrast technique limits evaluation of the proximal common carotid arteries. Within this limitation, the common carotid and internal carotid arteries are patent within the neck without hemodynamically significant stenosis. 2. Motion degradation and non-contrast technique precludes evaluation of the left vertebral artery V1 segment, and the right vertebral artery proximal V1 segment. Additionally, non-contrast technique precludes evaluation of the bilateral vertebral artery V3 segments. Elsewhere, the vertebral arteries are patent within the neck without appreciable hemodynamically significant stenosis. Electronically Signed   By: Kellie Simmering D.O.   On: 03/13/2022 10:58       LOS: 2 days   [place]  Hospitalists Pager on [email or web address]  03/15/2022, 9:17 AM

## 2022-03-15 NOTE — Progress Notes (Signed)
STROKE TEAM PROGRESS NOTE   INTERVAL HISTORY RN at bedside.  Patient sitting in chair, had loop recorder placed.  Patient wants to go home, home health ordered.  He has left hand weakness is improving.  Vitals:   03/14/22 2020 03/15/22 0315 03/15/22 1241 03/15/22 1526  BP: 121/89 132/89 123/88 (!) 117/96  Pulse: 68 74 76 70  Resp: 16 16 18 18   Temp: 98 F (36.7 C)  97.8 F (36.6 C) 97.6 F (36.4 C)  TempSrc: Oral  Oral Oral  SpO2: 100% 94% 100% 99%  Height:       CBC:  Recent Labs  Lab 03/12/22 1439 03/14/22 0417  WBC 5.1 4.2  NEUTROABS 2.5  --   HGB 14.3 12.9*  HCT 42.8 38.4*  MCV 88.1 88.3  PLT 268 009   Basic Metabolic Panel:  Recent Labs  Lab 03/14/22 0417 03/15/22 0636  NA 138 137  K 3.2* 3.6  CL 103 104  CO2 28 27  GLUCOSE 114* 108*  BUN 22 19  CREATININE 1.18 1.07  CALCIUM 9.8 9.4  MG 1.4* 1.9   Lipid Panel:  Recent Labs  Lab 03/14/22 0417  CHOL 66  TRIG 48  HDL 23*  CHOLHDL 2.9  VLDL 10  LDLCALC 33   HgbA1c:  Recent Labs  Lab 03/13/22 2051  HGBA1C 6.7*   Urine Drug Screen:  Recent Labs  Lab 03/14/22 0730  LABOPIA NONE DETECTED  COCAINSCRNUR NONE DETECTED  LABBENZ NONE DETECTED  AMPHETMU NONE DETECTED  THCU NONE DETECTED  LABBARB NONE DETECTED    Alcohol Level No results for input(s): "ETH" in the last 168 hours.  IMAGING past 24 hours No results found.  PHYSICAL EXAM  Physical Exam  Constitutional: Appears well-developed and well-nourished.   Cardiovascular: Normal rate and regular rhythm.  Respiratory: Effort normal, non-labored breathing  Neuro: AAOx3, no aphasia, follows simple commands, no significant dysarthria, able to name and repeat.  Visual fields full, no gaze palsy, facial symmetrical.  Moving all extremities symmetrically except left hand decreased dexterity with mild dysmetria.  Sensation symmetrical.    ASSESSMENT/PLAN Gavin Dennis is a 71 y.o. male with history of CVA, HTN, HLD,Vit B12 deficiency and  polysubstance abuse who presents to hospital for evaluation of not feeling right and the inability to use his left hand. He does not notice any weakness in his left upper extremity but he has noticed that his hand does not always do what he wants it to do, ie setting down his fork or letting go of the walker. He does also report a left knee injury from a previous fall that has affected his balance.   Stroke:  Cortical infarct in the right frontal lobe, embolic pattern, etiology unclear, concerning for afib  Code Stroke CT head No acute intracranial abnormality. Advanced chronic microvascular ischemic changes of the white matter and mild cerebral atrophy. MRI - Acute infarct in the right centrum semiovale extending to the parietal lobe cortex without hemorrhage or mass effect. Small remote lacunar infarcts  MRA head and neck - motion degraded but no LVO or significant stenosis LE Venous duplex- negative for DVT  2D Echo EF 60-65%, mildly dilated LA LDL 33 HgbA1c 6.7 Loop recorder placed prior to discharge VTE prophylaxis - Lovenox aspirin 81 mg daily prior to admission, now on aspirin 81 mg daily and clopidogrel 75 mg daily DAPT for 3 weeks and then plavix alone. PT also on oceanic trial medication Therapy recommendations:  SNF, patient wants [facility]  Disposition: Home today  Hx stroke/TIA 07/10/2020 admitted for vision changes, MRI showed multiple Concurrent Bilateral subcortical and midbrain lacunar Infarcts.  MRA negative, CT head and neck unremarkable, EF 55 to 60%, LDL 75, A1c 6.3.  Patient discharged DAPT and Lipitor 20 Followed up with Dr. Leta Baptist at [facility]  Hypertension Home meds:  Coreg 6.25, Norvasc 10mg  Stable Long-term BP goal normotensive  Hyperlipidemia Home meds:  Atorvastatin 40mg , resumed in hospital LDL 33, goal < 70 Continue statin at discharge  Other Stroke Risk Factors Advanced Age >/= 80  Former cigarette smoker Significant family history of stroke  Other Active  Problems Hypokalemia, hypomagnesemia- replete per primary team Ascending aortic aneurysm Mediastinal and right hilar lympadenopathy  Hospital day # 2  Neurology will sign off. Please call with questions. Pt will follow up with stroke clinic NP at [facility] in about 4 weeks. Thanks for the consult.    Rosalin Hawking, MD PhD Stroke Neurology 03/15/2022 7:58 PM    To contact Stroke Continuity provider, please refer to [email or web address] After hours, contact General Neurology

## 2022-03-15 NOTE — Progress Notes (Signed)
Mobility Specialist: [place]  03/15/22 1540  Mobility  Activity Ambulated with assistance in hallway  Level of Assistance Contact guard assist, steadying assist  Assistive Device Cane  Distance Ambulated (ft) 150 ft  Activity Response Tolerated well  Mobility Referral Yes  $Mobility charge 1 Mobility   Pt received in the chair and agreeable to mobility. C/o L knee pain during ambulation, no rating given. No c/o dizziness or SOB. Pt back to the chair after session with call bell and phone in reach.   [place] Alaric Gladwin Mobility Specialist Please contact via SecureChat or Rehab office at [phone]

## 2022-03-15 NOTE — Plan of Care (Signed)
  Problem: Education: Goal: Knowledge of disease or condition will improve Outcome: Progressing   Problem: Coping: Goal: Will verbalize positive feelings about self Outcome: Progressing Goal: Will identify appropriate support needs Outcome: Progressing   Problem: Self-Care: Goal: Ability to participate in self-care as condition permits will improve Outcome: Progressing Goal: Ability to communicate needs accurately will improve Outcome: Progressing   Problem: Nutrition: Goal: Risk of aspiration will decrease Outcome: Progressing Goal: Dietary intake will improve Outcome: Progressing

## 2022-03-15 NOTE — Progress Notes (Signed)
Physical Therapy Treatment Patient Details Name: Gavin Dennis MRN: [id number] DOB: [birth year] Today's Date: 03/15/2022   History of Present Illness 71 y.o. male with medical history significant of embolic stroke 3220, left eye vision loss, ascending aortic aneurysm, HTN, presented with persistent coordination problems. MRI showed acute stroke in the right centrum semiovale extending to the parietal lobe without hemorrhage.    PT Comments    Pt with continued progress towards acute goals. Session focused on continued gait training with SPC with pt needing min assist throughout for sequencing with cane and steadying assist. Pt needing frequent standing breaks at start to to re-sequencing as pt with impaired coordination and decreased balance with noted improvement with increased distance and task practice. Pt able to complete stair training with min guard with SPC and no overt LOB noted. Pt continues to benefit from skilled PT services to progress toward functional mobility goals.    Recommendations for follow up therapy are one component of a multi-disciplinary discharge planning process, led by the attending physician.  Recommendations may be updated based on patient status, additional functional criteria and insurance authorization.  Follow Up Recommendations  Skilled nursing-short term rehab (<3 hours/day)     Assistance Recommended at Discharge Frequent or constant Supervision/Assistance  Patient can return home with the following A little help with walking and/or transfers;Direct supervision/assist for medications management;Assistance with cooking/housework;Assist for transportation;Help with stairs or ramp for entrance;A little help with bathing/dressing/bathroom   Equipment Recommendations  Cane    Recommendations for Other Services       Precautions / Restrictions Precautions Precautions: Fall Restrictions Weight Bearing Restrictions: No     Mobility  Bed Mobility                General bed mobility comments: OOB in recliner at beginning and end of session    Transfers Overall transfer level: Needs assistance Equipment used: None Transfers: Sit to/from Stand Sit to Stand: Min guard           General transfer comment: increased time, UE use; mild instability standing unsupported    Ambulation/Gait Ambulation/Gait assistance: Min guard, Supervision Gait Distance (Feet): 150 Feet (+ 100) Assistive device: Straight cane Gait Pattern/deviations: Step-through pattern, Decreased stance time - left, Ataxic, Wide base of support       General Gait Details: cues for sequencing with cane with noted incorrdination needing frequent standing breaks at start to "re-group" and then continue. improved with task practice   Stairs   Stairs assistance: Supervision, Min guard Stair Management: Forwards, Step to pattern, Alternating pattern, One rail Left, With cane Number of Stairs: 4 General stair comments: alternating to ascend, step to using L mainly on descent, cues for sequence   Wheelchair Mobility    Modified Rankin (Stroke Patients Only) Modified Rankin (Stroke Patients Only) Pre-Morbid Rankin Score: Slight disability Modified Rankin: Moderately severe disability     Balance Overall balance assessment: Needs assistance   Sitting balance-Leahy Scale: Good     Standing balance support: No upper extremity supported Standing balance-Leahy Scale: Fair Standing balance comment: able to stand at sink for grooming tasks                            Cognition Arousal/Alertness: Awake/alert Behavior During Therapy: WFL for tasks assessed/performed Overall Cognitive Status: Impaired/Different from baseline Area of Impairment: Memory, Problem solving, Awareness  Memory: Decreased short-term memory     Awareness: Emergent Problem Solving: Requires verbal cues, Difficulty sequencing General Comments:  Pt really struggles with coming up alternative ways to complete tasks with LUE which is normally dominant hand. decreased awareness, needing cues to let og of stair rail when steps completed        Exercises      General Comments        Pertinent Vitals/Pain Pain Assessment Pain Assessment: No/denies pain Pain Intervention(s): Monitored during session    Home Living                          Prior Function            PT Goals (current goals can now be found in the care plan section) Acute Rehab PT Goals PT Goal Formulation: With patient Time For Goal Achievement: 03/28/22    Frequency    Min 4X/week      PT Plan      Co-evaluation              AM-PAC PT "6 Clicks" Mobility   Outcome Measure  Help needed turning from your back to your side while in a flat bed without using bedrails?: None Help needed moving from lying on your back to sitting on the side of a flat bed without using bedrails?: A Little Help needed moving to and from a bed to a chair (including a wheelchair)?: A Little Help needed standing up from a chair using your arms (e.g., wheelchair or bedside chair)?: A Little Help needed to walk in hospital room?: A Little Help needed climbing 3-5 steps with a railing? : A Little 6 Click Score: 19    End of Session Equipment Utilized During Treatment: Gait belt Activity Tolerance: Patient tolerated treatment well Patient left: in chair;with call bell/phone within reach;with chair alarm set Nurse Communication: Mobility status PT Visit Diagnosis: Other abnormalities of gait and mobility (R26.89);Ataxic gait (R26.0)     Time: 7106-2694 PT Time Calculation (min) (ACUTE ONLY): 19 min  Charges:  $Gait Training: 8-22 mins                     Gissella Niblack R. PTA Acute Rehabilitation Services Office: [place] 03/15/2022, 10:19 AM

## 2022-03-15 NOTE — TOC Initial Note (Signed)
Transition of Care [facility]) - Initial/Assessment Note    Patient Details  Name: Gavin Dennis MRN: [id number] Date of Birth: 03-17-1951  Transition of Care [facility]) CM/SW Contact:    Coralee Pesa, [place] Phone Number: 03/15/2022, 10:45 AM  Clinical Narrative:                 CSW met with pt at bedside and discussed SNF recommendation. Pt states he does not want to go to SNF, but is agreeable to [facility]. Pt uses a cane at baseline, does not like the walker. Pt states he has several sister in laws that will check on him, and be able to transport him home from the hospital. Pt is walking 280 fit and is min guard/ supervision. Pt has no preference for [place], CM referred pt to [facility]. Pt and CSW discussed medications, CSW to follow up with MD to determine if medications will be sent home with him. Pt declined CSW speaking to family, as he has been speaking with them all morning. Pt is Aox4 and appropriate during assessment. TOC will continue to follow for DC needs.   Expected Discharge Plan: [place] Barriers to Discharge: Continued Medical Work up   Patient Goals and CMS Choice Patient states their goals for this hospitalization and ongoing recovery are:: Pt states he wants to return home to "pay my bills". CMS Medicare.gov Compare Post Acute Care list provided to:: Patient Choice offered to / list presented to : Patient  Expected Discharge Plan and Services Expected Discharge Plan: [place] Acute Care Choice: [place] arrangements for the past 2 months: Single Family Home                           HH Arranged: PT, OT          Prior Living Arrangements/Services Living arrangements for the past 2 months: Single Family Home Lives with:: Adult Children Patient language and need for interpreter reviewed:: Yes Do you feel safe going back to the place where you live?: Yes      Need for Family Participation in Patient Care: Yes  (Comment) Care giver support system in place?: Yes (comment)   Criminal Activity/Legal Involvement Pertinent to Current Situation/Hospitalization: No - Comment as needed  Activities of Daily Living      Permission Sought/Granted Permission sought to share information with : Family Supports Permission granted to share information with : No              Emotional Assessment Appearance:: Appears stated age Attitude/Demeanor/Rapport: Engaged Affect (typically observed): Appropriate Orientation: : Oriented to Self, Oriented to Place, Oriented to  Time, Oriented to Situation Alcohol / Substance Use: Not Applicable Psych Involvement: No (comment)  Admission diagnosis:  Stroke (cerebrum) ([place]) [I63.9] Patient Active Problem List   Diagnosis Date Noted   Stroke (cerebrum) ([place]) 03/13/2022   Aneurysm of ascending aorta without rupture ([place]) 08/26/2021   Atherosclerosis of coronary artery without angina pectoris 08/26/2021   Cervical disc disorder 08/26/2021   Essential hypertension 08/26/2021   Hyperlipidemia 08/26/2021   Hardening of the aorta (main artery of the heart) ([place]) 08/26/2021   Hypomagnesemia 08/26/2021   Other intervertebral disc degeneration, lumbar region 08/26/2021   Pulmonary emphysema ([place]) 08/26/2021   Type 2 diabetes mellitus with other specified complication ([place]) 09/38/1829   Stroke ([place]) 07/10/2020   Visual disturbance    CVA (  cerebral vascular accident) ([place]) 07/09/2020   PCP:  Mckinley Jewel, MD Pharmacy:   [facility] ([place]), [place] - [address] [place]  [address] Phone: [phone] Fax: [phone]     Social Determinants of Health (SDOH) Interventions    Readmission Risk Interventions     No data to display

## 2022-03-16 ENCOUNTER — Telehealth: Payer: Self-pay

## 2022-03-16 ENCOUNTER — Encounter (HOSPITAL_COMMUNITY): Payer: Self-pay | Admitting: Cardiovascular Disease

## 2022-03-16 NOTE — Telephone Encounter (Signed)
Attempted to contact patient for ILR post F/U. No answer and unable to leave VM.

## 2022-03-16 NOTE — Telephone Encounter (Signed)
-----   Message from Shirley Friar, PA-C sent at 03/15/2022  3:21 PM EST ----- Regarding: Same Day Discharge LOOP 03/15/22 Dr. Myles Gip

## 2022-03-17 NOTE — Telephone Encounter (Signed)
Unable to reach patient regarding f/u ILR. Home monitor /connected updated 03/17/22

## 2022-03-18 DIAGNOSIS — E119 Type 2 diabetes mellitus without complications: Secondary | ICD-10-CM | POA: Diagnosis not present

## 2022-03-18 DIAGNOSIS — Z7902 Long term (current) use of antithrombotics/antiplatelets: Secondary | ICD-10-CM | POA: Diagnosis not present

## 2022-03-18 DIAGNOSIS — H5462 Unqualified visual loss, left eye, normal vision right eye: Secondary | ICD-10-CM | POA: Diagnosis not present

## 2022-03-18 DIAGNOSIS — E785 Hyperlipidemia, unspecified: Secondary | ICD-10-CM | POA: Diagnosis not present

## 2022-03-18 DIAGNOSIS — I714 Abdominal aortic aneurysm, without rupture, unspecified: Secondary | ICD-10-CM | POA: Diagnosis not present

## 2022-03-18 DIAGNOSIS — I10 Essential (primary) hypertension: Secondary | ICD-10-CM | POA: Diagnosis not present

## 2022-03-18 DIAGNOSIS — Z48812 Encounter for surgical aftercare following surgery on the circulatory system: Secondary | ICD-10-CM | POA: Diagnosis not present

## 2022-03-18 DIAGNOSIS — I69352 Hemiplegia and hemiparesis following cerebral infarction affecting left dominant side: Secondary | ICD-10-CM | POA: Diagnosis not present

## 2022-03-18 DIAGNOSIS — I4891 Unspecified atrial fibrillation: Secondary | ICD-10-CM | POA: Diagnosis not present

## 2022-03-21 ENCOUNTER — Telehealth: Payer: Self-pay

## 2022-03-21 NOTE — Patient Outreach (Signed)
Received a red flag Emmi stroke notification for [name]. I have assigned Enzo Montgomery, RN to call for follow up and determine if there are any Case Management needs.    [place] [facility], [place], [place] Management [phone]

## 2022-03-21 NOTE — Patient Outreach (Signed)
  [place] Stroke Care Coordination Follow Up  03/21/2022 Name:  Gavin Dennis MRN:  [id number] DOB:  Oct 18, 1950  Subjective: Gavin Dennis is a 71 y.o. year old male who is a primary care patient of Pahwani, Michell Heinrich, MD   An Emmi alert was received indicating patient responded to questions: Scheduled a follow-up appointment?.   I reached out by phone to follow up on the alert and spoke to Patient. Patient voices that he is doing good. Reviewed and addressed red alert. He confirms he has PCP appt on tomorrow. However, he admits that he forgot to schedule stroke MD appt. Patient has d/c paperwork and able to verify contact info while on the phone and will call office ASAP. He denies any issues with transportation. He confirms he has all his meds. Patient reports therapist has been out to work with him and coming out today.   Care Coordination Interventions Activated:  Yes  Care Coordination Interventions:  Yes, provided   Follow up plan: No further intervention required. Advised patient that they would continue to get automated EMMI-Stroke post discharge calls to assess how they are doing following recent hospitalization and will receive a call from a nurse if any of their responses were abnormal. Patient voiced understanding and was appreciative of f/u call.  Encounter Outcome:  Pt. Visit Completed   Enzo Montgomery, RN,BSN,CCM [place] Management Telephonic Care Management Coordinator Direct Phone: [phone] Toll Free: [phone] Fax: [phone]

## 2022-03-27 DIAGNOSIS — E7849 Other hyperlipidemia: Secondary | ICD-10-CM | POA: Diagnosis not present

## 2022-03-27 DIAGNOSIS — E1169 Type 2 diabetes mellitus with other specified complication: Secondary | ICD-10-CM | POA: Diagnosis not present

## 2022-03-27 DIAGNOSIS — Z8673 Personal history of transient ischemic attack (TIA), and cerebral infarction without residual deficits: Secondary | ICD-10-CM | POA: Diagnosis not present

## 2022-03-27 DIAGNOSIS — I10 Essential (primary) hypertension: Secondary | ICD-10-CM | POA: Diagnosis not present

## 2022-03-27 DIAGNOSIS — R590 Localized enlarged lymph nodes: Secondary | ICD-10-CM | POA: Diagnosis not present

## 2022-04-06 ENCOUNTER — Encounter: Payer: Self-pay | Admitting: Pulmonary Disease

## 2022-04-06 ENCOUNTER — Ambulatory Visit (INDEPENDENT_AMBULATORY_CARE_PROVIDER_SITE_OTHER): Payer: Medicare HMO | Admitting: Pulmonary Disease

## 2022-04-06 VITALS — BP 138/76 | HR 64 | Temp 97.7°F | Ht 71.0 in | Wt 215.8 lb

## 2022-04-06 DIAGNOSIS — R599 Enlarged lymph nodes, unspecified: Secondary | ICD-10-CM | POA: Diagnosis not present

## 2022-04-06 NOTE — Progress Notes (Signed)
Gavin Dennis    [id number]    [birth year]  Primary Care Physician:Pahwani, Michell Heinrich, MD  Referring Physician: Bonnielee Haff, MD [address] [place],  [place] [address]  Chief complaint: Consult for lymphadenopathy  HPI: 71 y.o. who  has a past medical history of Blurred vision, left eye, CVA (cerebral vascular accident) ([facility]) (06/2020), DDD (degenerative disc disease), lumbar, Hyperlipidemia, Hypertension, Hypomagnesemia, Polio, and Vitamin B 12 deficiency.   He has been referred for evaluation of abnormal CT with mediastinal, hilar lymphadenopathy.  He was hospitalized in October 4166 for embolic stroke.  He was evaluated by neurology and CT on that admission showed incidental findings of mediastinal and hilar lymphadenopathy.  He has been referred here for further evaluation  History notable for COVID in 2022 when he developed CVA. He denies any cough, dyspnea or any other respiratory symptoms  Pets: No pets Occupation: Used to work in Control and instrumentation engineer, Architect, Chief Executive Officer. Exposures: Reports mold in the bathroom at home.  No hot tub, Tammi Klippel is a folliculitis Smoking history: 30-pack-year smoker.  Quit in 2021 Travel history: No significant travel history Relevant family history: No family history of lung disease  Outpatient Encounter Medications as of 04/06/2022  Medication Sig   atorvastatin (LIPITOR) 40 MG tablet Take 1 tablet (40 mg total) by mouth daily.   carvedilol (COREG) 6.25 MG tablet Take 1 tablet (6.25 mg total) by mouth 2 (two) times daily.   clopidogrel (PLAVIX) 75 MG tablet Take 1 tablet (75 mg total) by mouth daily.   metFORMIN (GLUCOPHAGE) 500 MG tablet Take 500 mg by mouth daily.   Study - OCEANIC-STROKE - asundexian 50 mg or placebo tablet (PI-Sethi) Take 1 tablet (50 mg total) by mouth daily. For Investigational Use Only. Take at the same time each day (preferably in the morning). Tablet should be swallowed whole with water; it CANNOT  be crushed or broken.   vitamin B-12 (CYANOCOBALAMIN) 500 MCG tablet Take 500 mcg by mouth daily.   amLODipine (NORVASC) 10 MG tablet Take 1 tablet (10 mg total) by mouth daily.   No facility-administered encounter medications on file as of 04/06/2022.    Allergies as of 04/06/2022   (No Known Allergies)    Past Medical History:  Diagnosis Date   Blurred vision, left eye    since stroke 06/2020   CVA (cerebral vascular accident) ([facility]) 06/2020   DDD (degenerative disc disease), lumbar    Hyperlipidemia    Hypertension    Hypomagnesemia    Polio    childhood   Vitamin B 12 deficiency     Past Surgical History:  Procedure Laterality Date   LOOP RECORDER INSERTION N/A 03/15/2022   Procedure: LOOP RECORDER INSERTION;  Surgeon: Melida Quitter, MD;  Location: [facility];  Service: Cardiovascular;  Laterality: N/A;    Family History  Problem Relation Age of Onset   Heart Problems Mother    Prostate cancer Father    Lung cancer Brother     Social History   Socioeconomic History   Marital status: Single    Spouse name: Not on file   Number of children: 4   Years of education: 6   Highest education level: 6th grade  Occupational History    Comment: retired  Tobacco Use   Smoking status: Former    Packs/day: 1.00    Years: 30.00    Total pack years: 30.00    Types: Cigarettes  Quit date: 05/02/2019    Years since quitting: 2.9   Smokeless tobacco: Never   Tobacco comments:    quit a mo ago  Substance and Sexual Activity   Alcohol use: Not Currently    Comment: quit a mo ago   Drug use: Not Currently    Types: Cocaine    Comment: hx of abuse   Sexual activity: Not on file  Other Topics Concern   Not on file  Social History Narrative   02/02/21 lives alone   Very little caffeine   Social Determinants of Health   Financial Resource Strain: High Risk (07/29/2021)   Overall Financial Resource Strain (CARDIA)    Difficulty of Paying Living Expenses:  Hard  Food Insecurity: Food Insecurity Present (07/29/2021)   Hunger Vital Sign    Worried About Running Out of Food in the Last Year: Sometimes true    Ran Out of Food in the Last Year: Sometimes true  Transportation Needs: No Transportation Needs (07/29/2021)   PRAPARE - Hydrologist (Medical): No    Lack of Transportation (Non-Medical): No  Physical Activity: Insufficiently Active (07/29/2021)   Exercise Vital Sign    Days of Exercise per Week: 7 days    Minutes of Exercise per Session: 10 min  Stress: No Stress Concern Present (07/29/2021)   [place]    Feeling of Stress : Not at all  Social Connections: Moderately Integrated (07/29/2021)   Social Connection and Isolation Panel [NHANES]    Frequency of Communication with Friends and Family: More than three times a week    Frequency of Social Gatherings with Friends and Family: More than three times a week    Attends Religious Services: More than 4 times per year    Active Member of [employer] or Organizations: Yes    Attends Music therapist: More than 4 times per year    Marital Status: Never married  Intimate Partner Violence: Not on file    Review of systems: Review of Systems  Constitutional: Negative for fever and chills.  HENT: Negative.   Eyes: Negative for blurred vision.  Respiratory: as per HPI  Cardiovascular: Negative for chest pain and palpitations.  Gastrointestinal: Negative for vomiting, diarrhea, blood per rectum. Genitourinary: Negative for dysuria, urgency, frequency and hematuria.  Musculoskeletal: Negative for myalgias, back pain and joint pain.  Skin: Negative for itching and rash.  Neurological: Negative for dizziness, tremors, focal weakness, seizures and loss of consciousness.  Endo/Heme/Allergies: Negative for environmental allergies.  Psychiatric/Behavioral: Negative for depression, suicidal ideas  and hallucinations.  All other systems reviewed and are negative.  Physical Exam: Blood pressure 138/76, pulse 64, temperature 97.7 F (36.5 C), temperature source Oral, height 5\' 11"  (1.803 m), weight 215 lb 12.8 oz (97.9 kg), SpO2 93 %. Gen:      No acute distress HEENT:  EOMI, sclera anicteric Neck:     No masses; no thyromegaly Lungs:    Clear to auscultation bilaterally; normal respiratory effort CV:         Regular rate and rhythm; no murmurs Abd:      + bowel sounds; soft, non-tender; no palpable masses, no distension Ext:    No edema; adequate peripheral perfusion Skin:      Warm and dry; no rash Neuro: alert and oriented x 3 Psych: normal mood and affect  Data Reviewed: Imaging: CT lung cancer screening 07/01/2021-moderate emphysema, scattered pulmonary nodules measuring up  to 11 mm.  CTA 03/13/2022-nonspecific mediastinal, right hilar lymphadenopathy which is new since March 2023.  Emphysema, negative for pulmonary embolism.  I have reviewed the images personally.  PFTs:  Labs:  Assessment:  Assessment for mediastinal, hilar lymphadenopathy CT of the chest from his recent admission reviewed with nonspecific mediastinal and right hilar enlargement.  This may be reactive during his hospitalization as he may have had some aspiration.  Although lungs are read as clear he does have some mild groundglass changes at the bases.  Will also need to rule out malignancy  I will obtain a PET scan for further evaluation and may need bronchoscope with endobronchial ultrasound biopsy if positive.  Plan/Recommendations: PET scan  Marshell Garfinkel MD [facility] 04/06/2022, 10:22 AM  CC: Bonnielee Haff, MD

## 2022-04-06 NOTE — Patient Instructions (Signed)
Will get a PET scan for evaluation of the lungs and lymph enlargement Return to clinic in 1 month for review of scan and plan for next steps.

## 2022-04-12 NOTE — Progress Notes (Deleted)
[facility] [address] [place]. [place] [address] [phone]       HOSPITAL FOLLOW UP NOTE  [name] Date of Birth:  [birth year] Medical Record Number:  [id number]   Reason for Referral:  hospital stroke follow up    SUBJECTIVE:   CHIEF COMPLAINT:  No chief complaint on file.   HPI:   [name] is a 71 y.o. male with history of CVA, HTN, HLD,Vit B12 deficiency and polysubstance abuse who presented to ED on 03/12/2022 for evaluation of not feeling right and the inability to use his left hand.  Evaluated by Dr. Roda Shutters for cortical infarct in the right frontal lobe, embolic pattern of unclear source.  MRA head/neck negative LVO or significant stenosis.  LE Doppler negative for DVT.  EF 60 to 65%.  LDL 33.  A1c 6.7.  Loop recorder placed prior to discharge.  Recommended DAPT for 3 weeks then Plavix alone, patient also placed on [place] trial medication.  Recommended continuation of home dose HTN and HLD medication.  Prior stroke history 06/2020 with MRI showing multiple concurrent bilateral subcortical and midbrain lacunar infarcts.  Therapies recommended SNF but patient declined and wanted home health therapies.        PERTINENT IMAGING  Per hospitalization on 03/12/2022 - *** Code Stroke CT head No acute intracranial abnormality. Advanced chronic microvascular ischemic changes of the white matter and mild cerebral atrophy. MRI - Acute infarct in the right centrum semiovale extending to the parietal lobe cortex without hemorrhage or mass effect. Small remote lacunar infarcts  MRA head and neck - motion degraded but no LVO or significant stenosis LE Venous duplex- negative for DVT  2D Echo EF 60-65%, mildly dilated LA LDL 33 HgbA1c 6.7    ROS:   14 system review of systems performed and negative with exception of ***  PMH:  Past Medical History:  Diagnosis Date   Blurred vision, left eye    since stroke 06/2020   CVA (cerebral vascular  accident) ([facility]) 06/2020   DDD (degenerative disc disease), lumbar    Hyperlipidemia    Hypertension    Hypomagnesemia    Polio    childhood   Vitamin B 12 deficiency     PSH:  Past Surgical History:  Procedure Laterality Date   LOOP RECORDER INSERTION N/A 03/15/2022   Procedure: LOOP RECORDER INSERTION;  Surgeon: Maurice Small, MD;  Location: [facility];  Service: Cardiovascular;  Laterality: N/A;    Social History:  Social History   Socioeconomic History   Marital status: Single    Spouse name: Not on file   Number of children: 4   Years of education: 6   Highest education level: 6th grade  Occupational History    Comment: retired  Tobacco Use   Smoking status: Former    Packs/day: 1.00    Years: 30.00    Total pack years: 30.00    Types: Cigarettes    Quit date: 05/02/2019    Years since quitting: 2.9   Smokeless tobacco: Never   Tobacco comments:    quit a mo ago  Substance and Sexual Activity   Alcohol use: Not Currently    Comment: quit a mo ago   Drug use: Not Currently    Types: Cocaine    Comment: hx of abuse   Sexual activity: Not on file  Other Topics Concern   Not on file  Social History Narrative   02/02/21 lives alone  Very little caffeine   Social Determinants of Health   Financial Resource Strain: High Risk (07/29/2021)   Overall Financial Resource Strain (CARDIA)    Difficulty of Paying Living Expenses: Hard  Food Insecurity: Food Insecurity Present (07/29/2021)   Hunger Vital Sign    Worried About Running Out of Food in the Last Year: Sometimes true    Ran Out of Food in the Last Year: Sometimes true  Transportation Needs: No Transportation Needs (07/29/2021)   PRAPARE - Administrator, Civil Service (Medical): No    Lack of Transportation (Non-Medical): No  Physical Activity: Insufficiently Active (07/29/2021)   Exercise Vital Sign    Days of Exercise per Week: 7 days    Minutes of Exercise per Session: 10 min   Stress: No Stress Concern Present (07/29/2021)   [employer] of Occupational Health - Occupational Stress Questionnaire    Feeling of Stress : Not at all  Social Connections: Moderately Integrated (07/29/2021)   Social Connection and Isolation Panel [NHANES]    Frequency of Communication with Friends and Family: More than three times a week    Frequency of Social Gatherings with Friends and Family: More than three times a week    Attends Religious Services: More than 4 times per year    Active Member of [employer] or Organizations: Yes    Attends Engineer, structural: More than 4 times per year    Marital Status: Never married  Catering manager Violence: Not on file    Family History:  Family History  Problem Relation Age of Onset   Heart Problems Mother    Prostate cancer Father    Lung cancer Brother     Medications:   Current Outpatient Medications on File Prior to Visit  Medication Sig Dispense Refill   amLODipine (NORVASC) 10 MG tablet Take 1 tablet (10 mg total) by mouth daily. 90 tablet 0   atorvastatin (LIPITOR) 40 MG tablet Take 1 tablet (40 mg total) by mouth daily. 90 tablet 3   carvedilol (COREG) 6.25 MG tablet Take 1 tablet (6.25 mg total) by mouth 2 (two) times daily. 60 tablet 5   clopidogrel (PLAVIX) 75 MG tablet Take 1 tablet (75 mg total) by mouth daily. 30 tablet 3   metFORMIN (GLUCOPHAGE) 500 MG tablet Take 500 mg by mouth daily.     Study - OCEANIC-STROKE - asundexian 50 mg or placebo tablet (PI-Sethi) Take 1 tablet (50 mg total) by mouth daily. For Investigational Use Only. Take at the same time each day (preferably in the morning). Tablet should be swallowed whole with water; it CANNOT be crushed or broken. 98 tablet 0   vitamin B-12 (CYANOCOBALAMIN) 500 MCG tablet Take 500 mcg by mouth daily.     No current facility-administered medications on file prior to visit.    Allergies:  No Known Allergies    OBJECTIVE:  Physical Exam  There were  no vitals filed for this visit. There is no height or weight on file to calculate BMI. No results found.     07/29/2021   12:30 PM  Depression screen PHQ 2/9  Decreased Interest 0  Down, Depressed, Hopeless 0  PHQ - 2 Score 0     General: well developed, well nourished, seated, in no evident distress Head: head normocephalic and atraumatic.   Neck: supple with no carotid or supraclavicular bruits Cardiovascular: regular rate and rhythm, no murmurs Musculoskeletal: no deformity Skin:  no rash/petichiae Vascular:  Normal pulses all extremities  Neurologic Exam Mental Status: Awake and fully alert. Oriented to place and time. Recent and remote memory intact. Attention span, concentration and fund of knowledge appropriate. Mood and affect appropriate.  Cranial Nerves: Fundoscopic exam reveals sharp disc margins. Pupils equal, briskly reactive to light. Extraocular movements full without nystagmus. Visual fields full to confrontation. Hearing intact. Facial sensation intact. Face, tongue, palate moves normally and symmetrically.  Motor: Normal bulk and tone. Normal strength in all tested extremity muscles Sensory.: intact to touch , pinprick , position and vibratory sensation.  Coordination: Rapid alternating movements normal in all extremities. Finger-to-nose and heel-to-shin performed accurately bilaterally. Gait and Station: Arises from chair without difficulty. Stance is normal. Gait demonstrates normal stride length and balance with ***. Tandem walk and heel toe ***.  Reflexes: 1+ and symmetric. Toes downgoing.     NIHSS  *** Modified Rankin  ***      ASSESSMENT: Gavin Dennis is a 71 y.o. year old male with cortical infarct in the right frontal lobe on 03/12/2022, embolic pattern secondary to unclear source s/p ILR. Vascular risk factors include HTN, HLD, prior stroke 06/2020, advanced age, and former tobacco use.      PLAN:  Cryptogenic stroke:  Residual deficit: ***.   Loop recorder -initial download scheduled 12/19 Continue Plavix and atorvastatin (Lipitor) for secondary stroke prevention.   Discussed secondary stroke prevention measures and importance of close PCP follow up for aggressive stroke risk factor management including BP goal<130/90, HLD with LDL goal<70 and DM with A1c.<7 .  Stroke labs 03/2022: LDL 33, A1c 6.7 I have gone over the pathophysiology of stroke, warning signs and symptoms, risk factors and their management in some detail with instructions to go to the closest emergency room for symptoms of concern.     Follow up in *** or call earlier if needed   CC:  [facility] provider: Dr. Pearlean Brownie PCP: Ollen Bowl, MD    I spent *** minutes of face-to-face and non-face-to-face time with patient.  This included previsit chart review including review of recent hospitalization, lab review, study review, order entry, electronic health record documentation, patient education regarding recent stroke including etiology, secondary stroke prevention measures and importance of managing stroke risk factors, residual deficits and typical recovery time and answered all other questions to patient satisfaction   Ihor Austin, AGNP-BC  [facility] [address] [place], [place] [address]  Phone [phone] Fax [phone] Note: This document was prepared with digital dictation and possible smart phrase technology. Any transcriptional errors that result from this process are unintentional.

## 2022-04-13 ENCOUNTER — Inpatient Hospital Stay: Payer: Medicare HMO | Admitting: Adult Health

## 2022-04-18 ENCOUNTER — Ambulatory Visit (INDEPENDENT_AMBULATORY_CARE_PROVIDER_SITE_OTHER): Payer: Medicare HMO

## 2022-04-18 DIAGNOSIS — I6300 Cerebral infarction due to thrombosis of unspecified precerebral artery: Secondary | ICD-10-CM | POA: Diagnosis not present

## 2022-04-19 LAB — CUP PACEART REMOTE DEVICE CHECK
Date Time Interrogation Session: 20231219173612
Implantable Pulse Generator Implant Date: 20161115

## 2022-04-21 ENCOUNTER — Ambulatory Visit (HOSPITAL_COMMUNITY)
Admission: RE | Admit: 2022-04-21 | Discharge: 2022-04-21 | Disposition: A | Discharge status: Home / Self Care | Payer: Medicare HMO | Source: Ambulatory Visit | Attending: Pulmonary Disease | Admitting: Pulmonary Disease

## 2022-04-21 DIAGNOSIS — I700 Atherosclerosis of aorta: Secondary | ICD-10-CM | POA: Insufficient documentation

## 2022-04-21 DIAGNOSIS — N200 Calculus of kidney: Secondary | ICD-10-CM | POA: Insufficient documentation

## 2022-04-21 DIAGNOSIS — I2510 Atherosclerotic heart disease of native coronary artery without angina pectoris: Secondary | ICD-10-CM | POA: Diagnosis not present

## 2022-04-21 DIAGNOSIS — I6529 Occlusion and stenosis of unspecified carotid artery: Secondary | ICD-10-CM | POA: Diagnosis not present

## 2022-04-21 DIAGNOSIS — R590 Localized enlarged lymph nodes: Secondary | ICD-10-CM | POA: Insufficient documentation

## 2022-04-21 DIAGNOSIS — K5730 Diverticulosis of large intestine without perforation or abscess without bleeding: Secondary | ICD-10-CM | POA: Insufficient documentation

## 2022-04-21 DIAGNOSIS — R918 Other nonspecific abnormal finding of lung field: Secondary | ICD-10-CM | POA: Diagnosis not present

## 2022-04-21 DIAGNOSIS — N289 Disorder of kidney and ureter, unspecified: Secondary | ICD-10-CM | POA: Insufficient documentation

## 2022-04-21 DIAGNOSIS — R599 Enlarged lymph nodes, unspecified: Secondary | ICD-10-CM | POA: Diagnosis not present

## 2022-04-21 LAB — GLUCOSE, CAPILLARY: Glucose-Capillary: 117 mg/dL — ABNORMAL HIGH (ref 70–99)

## 2022-04-21 MED ORDER — FLUDEOXYGLUCOSE F - 18 (FDG) INJECTION
10.7400 | Freq: Once | INTRAVENOUS | Status: AC | PRN
  Administered 2022-04-21: 10.74 via INTRAVENOUS

## 2022-05-11 NOTE — Addendum Note (Signed)
Encounter addended by: Valinda Hoar on: 05/11/2022 8:55 AM  Actions taken: Imaging Exam ended

## 2022-05-12 ENCOUNTER — Encounter: Payer: Self-pay | Admitting: Pulmonary Disease

## 2022-05-12 ENCOUNTER — Ambulatory Visit (INDEPENDENT_AMBULATORY_CARE_PROVIDER_SITE_OTHER): Payer: Medicare HMO | Admitting: Pulmonary Disease

## 2022-05-12 VITALS — BP 138/76 | HR 77 | Temp 97.9°F | Ht 71.0 in | Wt 215.0 lb

## 2022-05-12 DIAGNOSIS — R599 Enlarged lymph nodes, unspecified: Secondary | ICD-10-CM

## 2022-05-12 DIAGNOSIS — R590 Localized enlarged lymph nodes: Secondary | ICD-10-CM | POA: Diagnosis not present

## 2022-05-12 DIAGNOSIS — Z1152 Encounter for screening for COVID-19: Secondary | ICD-10-CM

## 2022-05-12 DIAGNOSIS — Z01812 Encounter for preprocedural laboratory examination: Secondary | ICD-10-CM

## 2022-05-12 NOTE — Patient Instructions (Signed)
I have reviewed your scan which shows activity in the lymph nodes I recommend that we get a biopsy to get further information about the lymph nodes Will try to schedule it in the next week Please hold starting today Please hold aspirin the day before procedure Follow-up in 3 months

## 2022-05-12 NOTE — Addendum Note (Signed)
Addended by: Jacquiline Doe on: 05/12/2022 09:23 AM   Modules accepted: Orders

## 2022-05-12 NOTE — Progress Notes (Addendum)
Gavin Dennis    [id number]    Sep 02, 1950  Primary Care Physician:Pahwani, [name], MD  Referring Physician: Ollen Bowl, MD [address] [place],  [place] [address]  Chief complaint: Consult for lymphadenopathy  HPI: 72 y.o. who  has a past medical history of Blurred vision, left eye, CVA (cerebral vascular accident) (HCC) (06/2020), DDD (degenerative disc disease), lumbar, Hyperlipidemia, Hypertension, Hypomagnesemia, Polio, and Vitamin B 12 deficiency.   He has been referred for evaluation of abnormal CT with mediastinal, hilar lymphadenopathy.  He was hospitalized in October 2023 for embolic stroke.  He was evaluated by neurology and CT on that admission showed incidental findings of mediastinal and hilar lymphadenopathy.  He has been referred here for further evaluation  History notable for COVID in 2022 when he developed CVA. He denies any cough, dyspnea or any other respiratory symptoms  Pets: No pets Occupation: Used to work in Manufacturing systems engineer, Holiday representative, Engineer, materials. Exposures: Reports mold in the bathroom at home.  No hot tub, Karna Christmas is a folliculitis Smoking history: 30-pack-year smoker.  Quit in 2021 Travel history: No significant travel history Relevant family history: No family history of lung disease  Outpatient Encounter Medications as of 05/12/2022  Medication Sig   amLODipine (NORVASC) 10 MG tablet Take 1 tablet (10 mg total) by mouth daily.   aspirin EC 81 MG tablet Take 81 mg by mouth daily. Swallow whole.   atorvastatin (LIPITOR) 40 MG tablet Take 1 tablet (40 mg total) by mouth daily.   carvedilol (COREG) 6.25 MG tablet Take 1 tablet (6.25 mg total) by mouth 2 (two) times daily.   clopidogrel (PLAVIX) 75 MG tablet Take 1 tablet (75 mg total) by mouth daily.   metFORMIN (GLUCOPHAGE) 500 MG tablet Take 500 mg by mouth daily.   Study - OCEANIC-STROKE - asundexian 50 mg or placebo tablet (PI-Sethi) Take 1 tablet (50 mg total) by  mouth daily. For Investigational Use Only. Take at the same time each day (preferably in the morning). Tablet should be swallowed whole with water; it CANNOT be crushed or broken.   vitamin B-12 (CYANOCOBALAMIN) 500 MCG tablet Take 1,000 mcg by mouth daily.   No facility-administered encounter medications on file as of 05/12/2022.    Allergies as of 05/12/2022   (No Known Allergies)    Past Medical History:  Diagnosis Date   Blurred vision, left eye    since stroke 06/2020   CVA (cerebral vascular accident) (HCC) 06/2020   DDD (degenerative disc disease), lumbar    Hyperlipidemia    Hypertension    Hypomagnesemia    Polio    childhood   Vitamin B 12 deficiency     Past Surgical History:  Procedure Laterality Date   LOOP RECORDER INSERTION N/A 03/15/2022   Procedure: LOOP RECORDER INSERTION;  Surgeon: Maurice Small, MD;  Location: [facility];  Service: Cardiovascular;  Laterality: N/A;    Family History  Problem Relation Age of Onset   Heart Problems Mother    Prostate cancer Father    Lung cancer Brother     Social History   Socioeconomic History   Marital status: Single    Spouse name: Not on file   Number of children: 4   Years of education: 6   Highest education level: 6th grade  Occupational History    Comment: retired  Tobacco Use   Smoking status: Former    Packs/day: 1.00  Years: 30.00    Total pack years: 30.00    Types: Cigarettes    Quit date: 05/02/2019    Years since quitting: 3.0   Smokeless tobacco: Never   Tobacco comments:    quit a mo ago  Substance and Sexual Activity   Alcohol use: Not Currently    Comment: quit a mo ago   Drug use: Not Currently    Types: Cocaine    Comment: hx of abuse   Sexual activity: Not on file  Other Topics Concern   Not on file  Social History Narrative   02/02/21 lives alone   Very little caffeine   Social Determinants of Health   Financial Resource Strain: High Risk (07/29/2021)   Overall  Financial Resource Strain (CARDIA)    Difficulty of Paying Living Expenses: Hard  Food Insecurity: Food Insecurity Present (07/29/2021)   Hunger Vital Sign    Worried About Running Out of Food in the Last Year: Sometimes true    Ran Out of Food in the Last Year: Sometimes true  Transportation Needs: No Transportation Needs (07/29/2021)   PRAPARE - Administrator, Civil Service (Medical): No    Lack of Transportation (Non-Medical): No  Physical Activity: Insufficiently Active (07/29/2021)   Exercise Vital Sign    Days of Exercise per Week: 7 days    Minutes of Exercise per Session: 10 min  Stress: No Stress Concern Present (07/29/2021)   [employer] of Occupational Health - Occupational Stress Questionnaire    Feeling of Stress : Not at all  Social Connections: Moderately Integrated (07/29/2021)   Social Connection and Isolation Panel [NHANES]    Frequency of Communication with Friends and Family: More than three times a week    Frequency of Social Gatherings with Friends and Family: More than three times a week    Attends Religious Services: More than 4 times per year    Active Member of [employer] or Organizations: Yes    Attends Engineer, structural: More than 4 times per year    Marital Status: Never married  Intimate Partner Violence: Not on file    Review of systems: Review of Systems  Constitutional: Negative for fever and chills.  HENT: Negative.   Eyes: Negative for blurred vision.  Respiratory: as per HPI  Cardiovascular: Negative for chest pain and palpitations.  Gastrointestinal: Negative for vomiting, diarrhea, blood per rectum. Genitourinary: Negative for dysuria, urgency, frequency and hematuria.  Musculoskeletal: Negative for myalgias, back pain and joint pain.  Skin: Negative for itching and rash.  Neurological: Negative for dizziness, tremors, focal weakness, seizures and loss of consciousness.  Endo/Heme/Allergies: Negative for environmental  allergies.  Psychiatric/Behavioral: Negative for depression, suicidal ideas and hallucinations.  All other systems reviewed and are negative.  Physical Exam: Blood pressure 138/76, pulse 64, temperature 97.7 F (36.5 C), temperature source Oral, height 5\' 11"  (1.803 m), weight 215 lb 12.8 oz (97.9 kg), SpO2 93 %. Gen:      No acute distress HEENT:  EOMI, sclera anicteric Neck:     No masses; no thyromegaly Lungs:    Clear to auscultation bilaterally; normal respiratory effort CV:         Regular rate and rhythm; no murmurs Abd:      + bowel sounds; soft, non-tender; no palpable masses, no distension Ext:    No edema; adequate peripheral perfusion Skin:      Warm and dry; no rash Neuro: alert and oriented x 3 Psych: normal mood  and affect  Data Reviewed: Imaging: CT lung cancer screening 07/01/2021-moderate emphysema, scattered pulmonary nodules measuring up to 11 mm.  CTA 03/13/2022-nonspecific mediastinal, right hilar lymphadenopathy which is new since March 2023.  Emphysema, negative for pulmonary embolism.  PET scan 8/20-23-hypermetabolic lymph nodes,  I have reviewed the images personally.  PFTs:  Labs:  Assessment:  Assessment for mediastinal, hilar lymphadenopathy R/O malignancy CT of the chest from his recent admission reviewed with nonspecific mediastinal and right hilar enlargement.  This may be reactive during his hospitalization as he may have had some aspiration.  Although lungs are read as clear he does have some mild groundglass changes at the bases.  Will also need to rule out malignancy  PET scan reviewed with hypermetabolic uptake in the lymph nodes.  We reviewed the scan in detail today and we will schedule bronchoscopy, endobronchial ultrasound and biopsy Hold Plavix for 5 days before procedure  Plan/Recommendations: Schedule bronchoscopy, EBUS biopsy  Chilton Greathouse MD [facility] 05/12/2022, 9:01 AM  CC: Ollen Bowl, MD

## 2022-05-15 ENCOUNTER — Other Ambulatory Visit: Payer: Self-pay | Admitting: *Deleted

## 2022-05-15 DIAGNOSIS — Z01812 Encounter for preprocedural laboratory examination: Secondary | ICD-10-CM

## 2022-05-15 DIAGNOSIS — Z1152 Encounter for screening for COVID-19: Secondary | ICD-10-CM | POA: Diagnosis not present

## 2022-05-15 NOTE — Progress Notes (Signed)
Carelink Summary Report / Loop Recorder

## 2022-05-17 ENCOUNTER — Encounter (HOSPITAL_COMMUNITY): Payer: Self-pay | Admitting: Physician Assistant

## 2022-05-17 ENCOUNTER — Other Ambulatory Visit: Payer: Self-pay

## 2022-05-17 ENCOUNTER — Other Ambulatory Visit: Payer: Self-pay | Admitting: Pulmonary Disease

## 2022-05-17 ENCOUNTER — Encounter (HOSPITAL_COMMUNITY): Payer: Self-pay | Admitting: Pulmonary Disease

## 2022-05-17 LAB — NOVEL CORONAVIRUS, NAA: SARS-CoV-2, NAA: NOT DETECTED

## 2022-05-17 LAB — SPECIMEN STATUS REPORT

## 2022-05-17 NOTE — Progress Notes (Addendum)
PCP - Ardean Larsen, MD Cardiologist - Carolan Clines, MD  PPM/ICD - denies  Chest x-ray - 03/13/22/ EKG - 01/19/22 ECHO - 03/13/22 Cardiac Cath - denies  CPAP - n/a  Fasting Blood Sugar - patient is not checking CBG at home  Blood Thinner Instructions: Plavix - last day - 05/12/22 Aspirin Instructions - last day - 05/17/22  Patient was instructed: As of today, STOP taking any Aspirin (unless otherwise instructed by your surgeon) Aleve, Naproxen, Ibuprofen, Motrin, Advil, Goody's, BC's, all herbal medications, fish oil, and all vitamins.  ERAS Protcol - n/a  COVID TEST- negative on 05/15/22  Anesthesia review: yes - history of Stroke; loop recorder  Patient verbally denies any shortness of breath, fever, cough and chest pain during phone call   -------------  SDW INSTRUCTIONS given:  Your procedure is scheduled on Thursday, January 18th, 2024.  Report to [facility] Main Entrance "A" at 05:30 A.M., and check in at the Admitting office.  Call this number if you have problems the morning of surgery:  [phone]   Remember:  Do not eat or drink after midnight the night before your surgery    Take these medicines the morning of surgery with A SIP OF WATER: Amlodipine, Coreg, Lipitor, inhaler   The day of surgery:                     Do not wear jewelry,             Do not wear lotions, powders, colognes, or deodorant.            Men may shave face and neck.            Do not bring valuables to the hospital.            [facility] is not responsible for any belongings or valuables.  Do NOT Smoke (Tobacco/Vaping) 24 hours prior to your procedure If you use a CPAP at night, you may bring all equipment for your overnight stay.   Contacts, glasses, dentures or bridgework may not be worn into surgery.      For patients admitted to the hospital, discharge time will be determined by your treatment team.   Patients discharged the day of surgery will not be allowed to drive  home, and someone needs to stay with them for 24 hours.    Special instructions:   [place]- Preparing For Surgery  Before surgery, you can play an important role. Because skin is not sterile, your skin needs to be as free of germs as possible. You can reduce the number of germs on your skin by washing with CHG (chlorahexidine gluconate) Soap before surgery.  CHG is an antiseptic cleaner which kills germs and bonds with the skin to continue killing germs even after washing.    Oral Hygiene is also important to reduce your risk of infection.  Remember - BRUSH YOUR TEETH THE MORNING OF SURGERY WITH YOUR REGULAR TOOTHPASTE  Please do not use if you have an allergy to CHG or antibacterial soaps. If your skin becomes reddened/irritated stop using the CHG.  Do not shave (including legs and underarms) for at least 48 hours prior to first CHG shower. It is OK to shave your face.  Please follow these instructions carefully.   Shower the NIGHT BEFORE SURGERY and the MORNING OF SURGERY with DIAL Soap.   Pat yourself dry with a CLEAN TOWEL.  Wear CLEAN PAJAMAS to bed the night before  surgery  Place CLEAN SHEETS on your bed the night of your first shower and DO NOT SLEEP WITH PETS.   Day of Surgery: Please shower morning of surgery  Wear Clean/Comfortable clothing the morning of surgery Do not apply any deodorants/lotions.   Remember to brush your teeth WITH YOUR REGULAR TOOTHPASTE.   Questions were answered. Patient verbalized understanding of instructions.

## 2022-05-17 NOTE — Anesthesia Preprocedure Evaluation (Deleted)
Anesthesia Evaluation    Reviewed: reviewed documented beta blocker date and time   Airway        Dental   Pulmonary COPD,  COPD inhaler, former smoker Mediastinal adeopathy          Cardiovascular hypertension, Pt. on medications and Pt. on home beta blockers + CAD    Ascending Aortic aneurysm   Neuro/Psych CVA, Residual Symptoms  negative psych ROS   GI/Hepatic negative GI ROS, Neg liver ROS,,,  Endo/Other  diabetes, Well Controlled, Type 2, Insulin Dependent  HLD  Renal/GU negative Renal ROS  negative genitourinary   Musculoskeletal  (+) Arthritis , Osteoarthritis,    Abdominal   Peds  Hematology  (+) Blood dyscrasia, anemia   Anesthesia Other Findings   Reproductive/Obstetrics                              Anesthesia Physical Anesthesia Plan  ASA: 3  Anesthesia Plan: General   Post-op Pain Management: Minimal or no pain anticipated   Induction: Intravenous  PONV Risk Score and Plan: 2 and Treatment may vary due to age or medical condition, Ondansetron and Dexamethasone  Airway Management Planned: Oral ETT  Additional Equipment: None  Intra-op Plan:   Post-operative Plan: Extubation in OR  Informed Consent:   Plan Discussed with:   Anesthesia Plan Comments:          Anesthesia Quick Evaluation

## 2022-05-18 ENCOUNTER — Ambulatory Visit (HOSPITAL_COMMUNITY): Admission: RE | Admit: 2022-05-18 | Payer: Medicare HMO | Source: Home / Self Care | Admitting: Pulmonary Disease

## 2022-05-18 HISTORY — DX: Type 2 diabetes mellitus without complications: E11.9

## 2022-05-18 SURGERY — BRONCHOSCOPY, WITH EBUS
Anesthesia: General

## 2022-05-22 ENCOUNTER — Ambulatory Visit: Payer: Medicare HMO | Attending: Cardiovascular Disease

## 2022-05-22 DIAGNOSIS — I6300 Cerebral infarction due to thrombosis of unspecified precerebral artery: Secondary | ICD-10-CM

## 2022-05-23 LAB — CUP PACEART REMOTE DEVICE CHECK
Date Time Interrogation Session: 20240121173611
Implantable Pulse Generator Implant Date: 20161115

## 2022-05-30 DIAGNOSIS — J439 Emphysema, unspecified: Secondary | ICD-10-CM | POA: Diagnosis not present

## 2022-05-30 DIAGNOSIS — R600 Localized edema: Secondary | ICD-10-CM | POA: Diagnosis not present

## 2022-05-30 DIAGNOSIS — I7121 Aneurysm of the ascending aorta, without rupture: Secondary | ICD-10-CM | POA: Diagnosis not present

## 2022-05-30 DIAGNOSIS — I2510 Atherosclerotic heart disease of native coronary artery without angina pectoris: Secondary | ICD-10-CM | POA: Diagnosis not present

## 2022-05-30 DIAGNOSIS — E7849 Other hyperlipidemia: Secondary | ICD-10-CM | POA: Diagnosis not present

## 2022-05-30 DIAGNOSIS — I10 Essential (primary) hypertension: Secondary | ICD-10-CM | POA: Diagnosis not present

## 2022-05-30 DIAGNOSIS — E1169 Type 2 diabetes mellitus with other specified complication: Secondary | ICD-10-CM | POA: Diagnosis not present

## 2022-05-30 DIAGNOSIS — Z125 Encounter for screening for malignant neoplasm of prostate: Secondary | ICD-10-CM | POA: Diagnosis not present

## 2022-05-30 DIAGNOSIS — Z0000 Encounter for general adult medical examination without abnormal findings: Secondary | ICD-10-CM | POA: Diagnosis not present

## 2022-05-30 DIAGNOSIS — I700 Atherosclerosis of aorta: Secondary | ICD-10-CM | POA: Diagnosis not present

## 2022-05-30 DIAGNOSIS — Z8673 Personal history of transient ischemic attack (TIA), and cerebral infarction without residual deficits: Secondary | ICD-10-CM | POA: Diagnosis not present

## 2022-06-01 ENCOUNTER — Other Ambulatory Visit: Payer: Self-pay | Admitting: Pulmonary Disease

## 2022-06-01 ENCOUNTER — Encounter: Payer: Self-pay | Admitting: Pulmonary Disease

## 2022-06-01 ENCOUNTER — Other Ambulatory Visit: Payer: Self-pay | Admitting: *Deleted

## 2022-06-01 DIAGNOSIS — Z01812 Encounter for preprocedural laboratory examination: Secondary | ICD-10-CM

## 2022-06-01 DIAGNOSIS — R590 Localized enlarged lymph nodes: Secondary | ICD-10-CM

## 2022-06-01 NOTE — Progress Notes (Signed)
Patient's bronchoscopy was cancelled as he was on a study for factor XIa inhibitor that was not held before the procedure. Per the study PI it can be held for 5 days before procedure so we will reschedule for later this month and make sure he is instructed to hold the study drug and plavix at least 5 days before the procedure  Marshell Garfinkel MD [facility] 06/01/2022, 12:21 PM

## 2022-06-05 ENCOUNTER — Encounter (HOSPITAL_COMMUNITY): Payer: Self-pay | Admitting: Pulmonary Disease

## 2022-06-05 ENCOUNTER — Telehealth: Payer: Self-pay | Admitting: Pulmonary Disease

## 2022-06-05 NOTE — Progress Notes (Signed)
Attempted to obtain medical history via telephone, unable to reach at this time. Unable to leave voicemail to return pre surgical testing department's phone call,due to mailbox not set up.   

## 2022-06-05 NOTE — Telephone Encounter (Signed)
Per Dr. Vaughan Browner 06/05/22- Patient needs to stop Plavix, research medication today( 06/05/22), and stop ASA Sunday and Monday (06/11/22 and 06/12/22).  Called and spoke with patient.  Instructions from Dr. Vaughan Browner given.  Understanding stated. Nothing further at this time.

## 2022-06-08 ENCOUNTER — Other Ambulatory Visit: Payer: Medicare HMO

## 2022-06-08 DIAGNOSIS — Z01812 Encounter for preprocedural laboratory examination: Secondary | ICD-10-CM | POA: Diagnosis not present

## 2022-06-10 LAB — NOVEL CORONAVIRUS, NAA: SARS-CoV-2, NAA: NOT DETECTED

## 2022-06-11 ENCOUNTER — Encounter (HOSPITAL_COMMUNITY): Payer: Self-pay | Admitting: Pulmonary Disease

## 2022-06-12 ENCOUNTER — Ambulatory Visit (HOSPITAL_BASED_OUTPATIENT_CLINIC_OR_DEPARTMENT_OTHER): Payer: Medicare HMO | Admitting: Anesthesiology

## 2022-06-12 ENCOUNTER — Encounter (HOSPITAL_COMMUNITY)
Admission: RE | Disposition: A | Discharge status: Home / Self Care | Payer: Self-pay | Source: Home / Self Care | Attending: Pulmonary Disease

## 2022-06-12 ENCOUNTER — Ambulatory Visit (HOSPITAL_COMMUNITY): Payer: Medicare HMO

## 2022-06-12 ENCOUNTER — Ambulatory Visit (HOSPITAL_COMMUNITY)
Admission: RE | Admit: 2022-06-12 | Discharge: 2022-06-12 | Disposition: A | Discharge status: Home / Self Care | Payer: Medicare HMO | Attending: Pulmonary Disease | Admitting: Pulmonary Disease

## 2022-06-12 ENCOUNTER — Ambulatory Visit (HOSPITAL_COMMUNITY): Payer: Medicare HMO | Admitting: Anesthesiology

## 2022-06-12 DIAGNOSIS — M1990 Unspecified osteoarthritis, unspecified site: Secondary | ICD-10-CM | POA: Insufficient documentation

## 2022-06-12 DIAGNOSIS — I517 Cardiomegaly: Secondary | ICD-10-CM | POA: Diagnosis not present

## 2022-06-12 DIAGNOSIS — C781 Secondary malignant neoplasm of mediastinum: Secondary | ICD-10-CM | POA: Insufficient documentation

## 2022-06-12 DIAGNOSIS — Z87891 Personal history of nicotine dependence: Secondary | ICD-10-CM

## 2022-06-12 DIAGNOSIS — C969 Malignant neoplasm of lymphoid, hematopoietic and related tissue, unspecified: Secondary | ICD-10-CM | POA: Diagnosis not present

## 2022-06-12 DIAGNOSIS — J9811 Atelectasis: Secondary | ICD-10-CM | POA: Diagnosis not present

## 2022-06-12 DIAGNOSIS — R590 Localized enlarged lymph nodes: Secondary | ICD-10-CM | POA: Diagnosis not present

## 2022-06-12 DIAGNOSIS — C3490 Malignant neoplasm of unspecified part of unspecified bronchus or lung: Secondary | ICD-10-CM | POA: Diagnosis not present

## 2022-06-12 DIAGNOSIS — E538 Deficiency of other specified B group vitamins: Secondary | ICD-10-CM | POA: Diagnosis not present

## 2022-06-12 DIAGNOSIS — K219 Gastro-esophageal reflux disease without esophagitis: Secondary | ICD-10-CM | POA: Diagnosis not present

## 2022-06-12 DIAGNOSIS — J449 Chronic obstructive pulmonary disease, unspecified: Secondary | ICD-10-CM | POA: Insufficient documentation

## 2022-06-12 DIAGNOSIS — I2510 Atherosclerotic heart disease of native coronary artery without angina pectoris: Secondary | ICD-10-CM | POA: Diagnosis not present

## 2022-06-12 DIAGNOSIS — M5136 Other intervertebral disc degeneration, lumbar region: Secondary | ICD-10-CM | POA: Diagnosis not present

## 2022-06-12 DIAGNOSIS — I10 Essential (primary) hypertension: Secondary | ICD-10-CM | POA: Insufficient documentation

## 2022-06-12 DIAGNOSIS — Z8612 Personal history of poliomyelitis: Secondary | ICD-10-CM | POA: Insufficient documentation

## 2022-06-12 DIAGNOSIS — E119 Type 2 diabetes mellitus without complications: Secondary | ICD-10-CM | POA: Insufficient documentation

## 2022-06-12 DIAGNOSIS — I69312 Visuospatial deficit and spatial neglect following cerebral infarction: Secondary | ICD-10-CM | POA: Insufficient documentation

## 2022-06-12 DIAGNOSIS — E785 Hyperlipidemia, unspecified: Secondary | ICD-10-CM | POA: Insufficient documentation

## 2022-06-12 HISTORY — PX: BRONCHIAL NEEDLE ASPIRATION BIOPSY: SHX5106

## 2022-06-12 HISTORY — PX: HEMOSTASIS CONTROL: SHX6838

## 2022-06-12 HISTORY — PX: ENDOBRONCHIAL ULTRASOUND: SHX5096

## 2022-06-12 HISTORY — PX: VIDEO BRONCHOSCOPY: SHX5072

## 2022-06-12 LAB — GLUCOSE, CAPILLARY: Glucose-Capillary: 106 mg/dL — ABNORMAL HIGH (ref 70–99)

## 2022-06-12 SURGERY — VIDEO BRONCHOSCOPY WITHOUT FLUORO
Anesthesia: General

## 2022-06-12 MED ORDER — ROCURONIUM BROMIDE 10 MG/ML (PF) SYRINGE
PREFILLED_SYRINGE | INTRAVENOUS | Status: DC | PRN
  Administered 2022-06-12: 50 mg via INTRAVENOUS

## 2022-06-12 MED ORDER — FENTANYL CITRATE (PF) 100 MCG/2ML IJ SOLN
INTRAMUSCULAR | Status: AC
  Filled 2022-06-12: qty 2

## 2022-06-12 MED ORDER — LACTATED RINGERS IV SOLN: INTRAVENOUS | Status: DC

## 2022-06-12 MED ORDER — LIDOCAINE 2% (20 MG/ML) 5 ML SYRINGE
INTRAMUSCULAR | Status: DC | PRN
  Administered 2022-06-12: 80 mg via INTRAVENOUS

## 2022-06-12 MED ORDER — PROPOFOL 10 MG/ML IV BOLUS
INTRAVENOUS | Status: AC
  Filled 2022-06-12: qty 20

## 2022-06-12 MED ORDER — DEXAMETHASONE SODIUM PHOSPHATE 10 MG/ML IJ SOLN
INTRAMUSCULAR | Status: DC | PRN
  Administered 2022-06-12: 4 mg via INTRAVENOUS

## 2022-06-12 MED ORDER — ONDANSETRON HCL 4 MG/2ML IJ SOLN: 4.0000 mg | Freq: Once | INTRAMUSCULAR | Status: DC | PRN

## 2022-06-12 MED ORDER — ONDANSETRON HCL 4 MG/2ML IJ SOLN
INTRAMUSCULAR | Status: DC | PRN
  Administered 2022-06-12: 4 mg via INTRAVENOUS

## 2022-06-12 MED ORDER — FENTANYL CITRATE (PF) 100 MCG/2ML IJ SOLN: 25.0000 ug | INTRAMUSCULAR | Status: DC | PRN

## 2022-06-12 MED ORDER — PROPOFOL 10 MG/ML IV BOLUS
INTRAVENOUS | Status: DC | PRN
  Administered 2022-06-12: 135 ug/kg/min via INTRAVENOUS
  Administered 2022-06-12: 150 mg via INTRAVENOUS

## 2022-06-12 MED ORDER — SUGAMMADEX SODIUM 200 MG/2ML IV SOLN
INTRAVENOUS | Status: DC | PRN
  Administered 2022-06-12: 200 mg via INTRAVENOUS

## 2022-06-12 MED ORDER — FENTANYL CITRATE (PF) 100 MCG/2ML IJ SOLN
INTRAMUSCULAR | Status: DC | PRN
  Administered 2022-06-12: 25 ug via INTRAVENOUS
  Administered 2022-06-12: 75 ug via INTRAVENOUS

## 2022-06-12 NOTE — Anesthesia Postprocedure Evaluation (Signed)
Anesthesia Post Note  Patient: Jovani Flury  Procedure(s) Performed: VIDEO BRONCHOSCOPY WITHOUT FLUORO ENDOBRONCHIAL ULTRASOUND (Bilateral) BRONCHIAL NEEDLE ASPIRATION BIOPSIES HEMOSTASIS CONTROL     Patient location during evaluation: PACU Anesthesia Type: General Level of consciousness: awake and alert Pain management: pain level controlled Vital Signs Assessment: post-procedure vital signs reviewed and stable Respiratory status: spontaneous breathing, nonlabored ventilation and respiratory function stable Cardiovascular status: stable and blood pressure returned to baseline Anesthetic complications: no   No notable events documented.  Last Vitals:  Vitals:   06/12/22 1250 06/12/22 1300  BP: (!) 150/111 (!) 149/87  Pulse: 66 70  Resp: 18 18  Temp:    SpO2: 94% 94%    Last Pain:  Vitals:   06/12/22 1300  TempSrc:   PainSc: 0-No pain                 Audry Pili

## 2022-06-12 NOTE — Op Note (Signed)
[facility] Cardiopulmonary Patient Name: Gavin Dennis Procedure Date: 06/12/2022 MRN: [id number] Attending MD: Marshell Garfinkel , MD, [id number] Date of Birth: 12/21/1950 CSN: [id number] Age: 72 Admit Type: Outpatient Ethnicity: Unknown Procedure:             Bronchoscopy Indications:           Mediastinal adenopathy Providers:             Marshell Garfinkel, MD, Dulcy Fanny, Darliss Cheney,                         Technician, Arnoldo Hooker, CRNA Referring MD:           Medicines:             General Anesthesia Complications:         No immediate complications Estimated Blood Loss:  Estimated blood loss: none. Estimated blood loss was                         minimal. Procedure:      Pre-Anesthesia Assessment:      - A History and Physical has been performed. Patient meds and allergies       have been reviewed. The risks and benefits of the procedure and the       sedation options and risks were discussed with the patient. All       questions were answered and informed consent was obtained. Patient       identification and proposed procedure were verified prior to the       procedure by the physician in the pre-procedure area. Mental Status       Examination: alert and oriented. Airway Examination: normal       oropharyngeal airway. CV Examination: RRR, no murmurs, no S3 or S4. ASA       Grade Assessment: II - A patient with mild systemic disease. After       reviewing the risks and benefits, the patient was deemed in satisfactory       condition to undergo the procedure. The anesthesia plan was to use       general anesthesia. Immediately prior to administration of medications,       the patient was re-assessed for adequacy to receive sedatives. The heart       rate, respiratory rate, oxygen saturations, blood pressure, adequacy of       pulmonary ventilation, and response to care were monitored throughout       the procedure. The physical status of the patient was  re-assessed after       the procedure.      After obtaining informed consent, the bronchoscope was passed under       direct vision. Throughout the procedure, the patient's blood pressure,       pulse, and oxygen saturations were monitored continuously. the [id number]       ([id number]) Olympus bronoscope was introduced through the mouth, via the       endotracheal tube (the patient was intubated for the procedure) and       advanced to the tracheobronchial tree of both lungs. the BF- [id number] (       [id number] ) Olymps EBUS Scope was introduced through the and advanced to       the. The patient tolerated the procedure well. Findings:      The endotracheal tube is in good position. The visualized portion  of the       trachea is of normal caliber. The carina is sharp. The tracheobronchial       tree was examined to at least the first subsegmental level. Bronchial       mucosa and anatomy are normal; there are no endobronchial lesions, and       no secretions.      An endobronchial ultrasound endoscope was utilized in order to assist       with guiding the biopsy needle and better characterize the lesion in the       subcarinal area. Biopsy was obtained from Station 7 lymph node with       adequate specimens obtained.      Estimated blood loss: 5 mL. Impression:      - Mediastinal adenopathy      - The airway examination was normal.      - Endobronchial ultrasound was performed.      - Biopsy of Station 7 lymph node Moderate Sedation:      None Recommendation:      - Await biopsy and cytology results. Procedure Code(s):      --- Professional ---      [phone], Bronchoscopy, rigid or flexible, including fluoroscopic guidance,       when performed; diagnostic, with cell washing, when performed (separate       procedure)      31654, Bronchoscopy, rigid or flexible, including fluoroscopic guidance,       when performed; with transendoscopic endobronchial ultrasound (EBUS)       during  bronchoscopic diagnostic or therapeutic intervention(s) for       peripheral lesion(s) (List separately in addition to code for primary       procedure[s]) Diagnosis Code(s):      --- Professional ---      R59.0, Localized enlarged lymph nodes CPT copyright 2022 American Medical Association. All rights reserved. The codes documented in this report are preliminary and upon coder review may  be revised to meet current compliance requirements. Marshell Garfinkel, MD Marshell Garfinkel, MD 06/12/2022 12:23:52 PM Number of Addenda: 0 Scope In: Scope Out:

## 2022-06-12 NOTE — H&P (Signed)
         Irma Delancey    [id number]    1950/05/03  Primary Care Physician:Pahwani, Michell Heinrich, MD  Referring Physician: No referring provider defined for this encounter.  Chief complaint: Follow-up for lymphadenopathy  HPI: 72 y.o. who  has a past medical history of Blurred vision, left eye, CVA (cerebral vascular accident) ([place]) (06/2020), DDD (degenerative disc disease), lumbar, Diabetes mellitus without complication ([place]), Hyperlipidemia, Hypertension, Hypomagnesemia, Polio, and Vitamin B 12 deficiency.   He has been referred for evaluation of abnormal CT with mediastinal, hilar lymphadenopathy.  He was hospitalized in October 1779 for embolic stroke.  He was evaluated by neurology and CT on that admission showed incidental findings of mediastinal and hilar lymphadenopathy.  He presents today for endobronchial ultrasound and biopsy. No new complaints.  States that breathing is doing well.   History notable for COVID in 2022 when he developed CVA. He denies any cough, dyspnea or any other respiratory symptoms   Allergies as of 06/01/2022   (No Known Allergies)    Past Medical History:  Diagnosis Date   Blurred vision, left eye    since stroke 06/2020   CVA (cerebral vascular accident) ([place]) 06/2020   DDD (degenerative disc disease), lumbar    Diabetes mellitus without complication ([place])    Hyperlipidemia    Hypertension    Hypomagnesemia    Polio    childhood   Vitamin B 12 deficiency    Physical Exam: Blood pressure (!) 169/100, pulse 70, temperature (!) 97.5 F (36.4 C), temperature source Temporal, resp. rate 20, height 5\' 11"  (1.803 m), weight 98.9 kg, SpO2 99 %. Gen:      No acute distress HEENT:  EOMI, sclera anicteric Neck:     No masses; no thyromegaly Lungs:    Clear to auscultation bilaterally; normal respiratory effort CV:         Regular rate and rhythm; no murmurs Abd:      + bowel sounds; soft, non-tender; no palpable masses, no distension Ext:    No  edema; adequate peripheral perfusion Skin:      Warm and dry; no rash Neuro: alert and oriented x 3 Psych: normal mood and affect  Data Reviewed: Imaging: CTA 03/13/2022-nonspecific mediastinal, right hilar lymphadenopathy which is new since March 2023.  Emphysema, negative for pulmonary embolism.   PET scan 04/21/2022-hypermetabolic mediastinal, right hilar and infrahilar lymph nodes.  Assessment:  Assessment for mediastinal, hilar lymphadenopathy R/O malignancy CT of the chest from his recent admission reviewed with nonspecific mediastinal and right hilar enlargement.  This may be reactive during his hospitalization as he may have had some aspiration.  Although lungs are read as clear he does have some mild groundglass changes at the bases.  Will also need to rule out malignancy   PET scan reviewed with hypermetabolic uptake in the lymph nodes.  We reviewed the scan in detail and have decided to proceed with bronchoscopy, endobronchial ultrasound and biopsy.  Patient is on a factor XIa inhibitor research study medication and Plavix which has been held for 5 days.  He did not take his aspirin yesterday.  Plan/Recommendations: EBUS biopsy.  Marshell Garfinkel MD [facility] 06/12/2022, 11:08 AM  CC: No ref. provider found

## 2022-06-12 NOTE — Anesthesia Procedure Notes (Signed)
Procedure Name: Intubation Date/Time: 06/12/2022 11:20 AM  Performed by: Lavina Hamman, CRNAPre-anesthesia Checklist: Patient identified, Emergency Drugs available, Suction available, Patient being monitored and Timeout performed Patient Re-evaluated:Patient Re-evaluated prior to induction Oxygen Delivery Method: Circle system utilized Preoxygenation: Pre-oxygenation with 100% oxygen Induction Type: IV induction Ventilation: Mask ventilation without difficulty Laryngoscope Size: Mac and 4 Grade View: Grade I Tube type: Oral Tube size: 8.5 mm Number of attempts: 1 Airway Equipment and Method: Stylet Placement Confirmation: ETT inserted through vocal cords under direct vision, positive ETCO2, CO2 detector and breath sounds checked- equal and bilateral Secured at: 22 cm Tube secured with: Tape Dental Injury: Teeth and Oropharynx as per pre-operative assessment  Comments: ATOI, teeth unchanged, in poor condition.

## 2022-06-12 NOTE — Anesthesia Preprocedure Evaluation (Addendum)
Anesthesia Evaluation  Patient identified by MRN, date of birth, ID band Patient awake    Reviewed: Allergy & Precautions, NPO status , Patient's Chart, lab work & pertinent test results, reviewed documented beta blocker date and time   History of Anesthesia Complications Negative for: history of anesthetic complications  Airway Mallampati: II  TM Distance: >3 FB Neck ROM: Full    Dental  (+) Dental Advisory Given, Chipped   Pulmonary COPD,  COPD inhaler, former smoker   Pulmonary exam normal        Cardiovascular hypertension, Pt. on home beta blockers and Pt. on medications + CAD  Normal cardiovascular exam   '23 TTE - EF 60 to 65%. There is mild left ventricular hypertrophy. There is mildly elevated pulmonary artery systolic pressure. Left atrial size was mildly dilated. Trivial mitral valve regurgitation. There is moderate dilatation of the ascending aorta, measuring 40 mm.     Neuro/Psych CVA, Residual Symptoms  negative psych ROS   GI/Hepatic Neg liver ROS,GERD  Medicated and Controlled,,  Endo/Other  diabetes, Well Controlled, Type 2    Renal/GU negative Renal ROS     Musculoskeletal  (+) Arthritis ,    Abdominal   Peds  Hematology  On plavix    Anesthesia Other Findings   Reproductive/Obstetrics                              Anesthesia Physical Anesthesia Plan  ASA: 3  Anesthesia Plan: General   Post-op Pain Management: Minimal or no pain anticipated   Induction: Intravenous  PONV Risk Score and Plan: 2 and Treatment may vary due to age or medical condition, Ondansetron and Dexamethasone  Airway Management Planned: Oral ETT  Additional Equipment: None  Intra-op Plan:   Post-operative Plan: Extubation in OR  Informed Consent: I have reviewed the patients History and Physical, chart, labs and discussed the procedure including the risks, benefits and alternatives for  the proposed anesthesia with the patient or authorized representative who has indicated his/her understanding and acceptance.     Dental advisory given  Plan Discussed with: CRNA and Anesthesiologist  Anesthesia Plan Comments:          Anesthesia Quick Evaluation

## 2022-06-12 NOTE — Transfer of Care (Signed)
Immediate Anesthesia Transfer of Care Note  Patient: Gavin Dennis  Procedure(s) Performed: VIDEO BRONCHOSCOPY WITHOUT FLUORO ENDOBRONCHIAL ULTRASOUND (Bilateral) BRONCHIAL NEEDLE ASPIRATION BIOPSIES HEMOSTASIS CONTROL  Patient Location: PACU  Anesthesia Type:General  Level of Consciousness: awake and patient cooperative  Airway & Oxygen Therapy: Patient Spontanous Breathing and Patient connected to face mask  Post-op Assessment: Report given to RN and Post -op Vital signs reviewed and stable  Post vital signs: Reviewed and stable  Last Vitals:  Vitals Value Taken Time  BP    Temp    Pulse    Resp    SpO2      Last Pain:  Vitals:   06/12/22 1049  TempSrc: Temporal         Complications: No notable events documented.

## 2022-06-13 ENCOUNTER — Other Ambulatory Visit: Payer: Self-pay | Admitting: *Deleted

## 2022-06-13 DIAGNOSIS — R590 Localized enlarged lymph nodes: Secondary | ICD-10-CM | POA: Diagnosis not present

## 2022-06-13 DIAGNOSIS — J439 Emphysema, unspecified: Secondary | ICD-10-CM | POA: Diagnosis not present

## 2022-06-13 DIAGNOSIS — R6889 Other general symptoms and signs: Secondary | ICD-10-CM

## 2022-06-13 DIAGNOSIS — Z87891 Personal history of nicotine dependence: Secondary | ICD-10-CM | POA: Diagnosis not present

## 2022-06-13 DIAGNOSIS — T148XXD Other injury of unspecified body region, subsequent encounter: Secondary | ICD-10-CM | POA: Diagnosis not present

## 2022-06-13 DIAGNOSIS — R600 Localized edema: Secondary | ICD-10-CM | POA: Diagnosis not present

## 2022-06-13 DIAGNOSIS — L089 Local infection of the skin and subcutaneous tissue, unspecified: Secondary | ICD-10-CM | POA: Diagnosis not present

## 2022-06-13 DIAGNOSIS — M25562 Pain in left knee: Secondary | ICD-10-CM | POA: Diagnosis not present

## 2022-06-13 DIAGNOSIS — R918 Other nonspecific abnormal finding of lung field: Secondary | ICD-10-CM

## 2022-06-13 LAB — CYTOLOGY - NON PAP

## 2022-06-14 ENCOUNTER — Telehealth: Payer: Self-pay | Admitting: Pulmonary Disease

## 2022-06-14 ENCOUNTER — Encounter (HOSPITAL_COMMUNITY): Payer: Self-pay | Admitting: Pulmonary Disease

## 2022-06-14 DIAGNOSIS — M25562 Pain in left knee: Secondary | ICD-10-CM | POA: Diagnosis not present

## 2022-06-14 NOTE — Telephone Encounter (Signed)
Fax received from [facility] requesting information from patients chart.I will look over request and fax requested information to [facility] 06/15/22.

## 2022-06-14 NOTE — Telephone Encounter (Signed)
[employer] is needing prior authorization for genetic testing that was ordered in the hospital. Please advise. [employer] will be faxing over information.

## 2022-06-15 ENCOUNTER — Telehealth: Payer: Self-pay | Admitting: Pulmonary Disease

## 2022-06-15 ENCOUNTER — Encounter (HOSPITAL_BASED_OUTPATIENT_CLINIC_OR_DEPARTMENT_OTHER): Payer: Medicare HMO | Attending: Internal Medicine | Admitting: Internal Medicine

## 2022-06-15 DIAGNOSIS — I87311 Chronic venous hypertension (idiopathic) with ulcer of right lower extremity: Secondary | ICD-10-CM | POA: Insufficient documentation

## 2022-06-15 DIAGNOSIS — E11622 Type 2 diabetes mellitus with other skin ulcer: Secondary | ICD-10-CM | POA: Diagnosis not present

## 2022-06-15 DIAGNOSIS — I890 Lymphedema, not elsewhere classified: Secondary | ICD-10-CM | POA: Diagnosis not present

## 2022-06-15 DIAGNOSIS — Z8673 Personal history of transient ischemic attack (TIA), and cerebral infarction without residual deficits: Secondary | ICD-10-CM | POA: Insufficient documentation

## 2022-06-15 DIAGNOSIS — L97812 Non-pressure chronic ulcer of other part of right lower leg with fat layer exposed: Secondary | ICD-10-CM | POA: Insufficient documentation

## 2022-06-15 LAB — BODY FLUID CULTURE W GRAM STAIN: Culture: NO GROWTH

## 2022-06-15 LAB — ACID FAST SMEAR (AFB, MYCOBACTERIA): Acid Fast Smear: NEGATIVE

## 2022-06-15 NOTE — Telephone Encounter (Signed)
Ref # [id number] Fax [phone] Call Back # [phone]    [facility] Pre-Auth for Genetic Testing calling and needs clinical information on this PT.   Genetic Testing Req, office notes, Our Tax ID, Any Diag Testing, Meds, Pathology Report if applicible and more.

## 2022-06-15 NOTE — Telephone Encounter (Signed)
Requested notes, images, and labs faxed to [facility] pre authorization department [phone]. Confirmation received. Patient reference number [id number].

## 2022-06-16 DIAGNOSIS — I700 Atherosclerosis of aorta: Secondary | ICD-10-CM | POA: Diagnosis not present

## 2022-06-16 DIAGNOSIS — M1712 Unilateral primary osteoarthritis, left knee: Secondary | ICD-10-CM | POA: Diagnosis not present

## 2022-06-16 DIAGNOSIS — E7800 Pure hypercholesterolemia, unspecified: Secondary | ICD-10-CM | POA: Diagnosis not present

## 2022-06-16 DIAGNOSIS — I119 Hypertensive heart disease without heart failure: Secondary | ICD-10-CM | POA: Diagnosis not present

## 2022-06-16 DIAGNOSIS — J439 Emphysema, unspecified: Secondary | ICD-10-CM | POA: Diagnosis not present

## 2022-06-16 DIAGNOSIS — E1169 Type 2 diabetes mellitus with other specified complication: Secondary | ICD-10-CM | POA: Diagnosis not present

## 2022-06-16 DIAGNOSIS — L089 Local infection of the skin and subcutaneous tissue, unspecified: Secondary | ICD-10-CM | POA: Diagnosis not present

## 2022-06-16 DIAGNOSIS — H547 Unspecified visual loss: Secondary | ICD-10-CM | POA: Diagnosis not present

## 2022-06-16 DIAGNOSIS — I69398 Other sequelae of cerebral infarction: Secondary | ICD-10-CM | POA: Diagnosis not present

## 2022-06-16 NOTE — Telephone Encounter (Signed)
Requested information faxed again to [facility] at requested number [phone].  Confirmation received. [employer] and spoke with representative.  [employer] is aware fax was sent 06/15/22 with requested notes.  Advised bronchoscopy and labs ordered were done at [facility].  Understanding stated. Nothing further at this time.

## 2022-06-16 NOTE — Progress Notes (Signed)
SRIJAN, GIVAN ([id number]) [id number] Page 1 of 4 Visit Report for 06/15/2022 Abuse Risk Screen Details Patient Name: Date of Service: Gavin Dennis, Gavin Dennis Gavin Dennis 06/15/2022 9:30 A M Medical Record Number: [id number] Patient Account Number: [id number] Date of Birth/Sex: Treating RN: July 28, 1950 (72 y.o. Gavin Dennis Primary Care Kamauri Denardo: Early Osmond Other Clinician: Referring Iracema Lanagan: Treating Chandrea Zellman/Extender: Young Berry, Rinka Weeks in Treatment: 0 Abuse Risk Screen Items Answer ABUSE RISK SCREEN: Has anyone close to you tried to hurt or harm you recentlyo No Do you feel uncomfortable with anyone in your familyo No Has anyone forced you do things that you didnt want to doo No Electronic Signature(s) Signed: 06/15/2022 4:32:13 PM By: Deon Pilling RN, BSN Entered By: Deon Pilling on 06/15/2022 10:06:46 -------------------------------------------------------------------------------- Activities of Daily Living Details Patient Name: Date of Service: Gavin Dennis, Gavin Dennis Gavin Dennis 06/15/2022 9:30 A M Medical Record Number: [id number] Patient Account Number: [id number] Date of Birth/Sex: Treating RN: 02-09-1951 (72 y.o. Gavin Dennis Primary Care Luise Yamamoto: Early Osmond Other Clinician: Referring Yakir Wenke: Treating Jerran Tappan/Extender: Young Berry, Rinka Weeks in Treatment: 0 Activities of Daily Living Items Answer Activities of Daily Living (Please select one for each item) Drive Automobile Not Able T Medications ake Completely Able Use T elephone Completely Able Care for Appearance Completely Able Use T oilet Completely Able Bath / Shower Completely Able Dress Self Completely Able Feed Self Completely Able Walk Completely Able Get In / Out Bed Completely Able Housework Completely Able Prepare Meals Completely [place] for Self Completely Able Electronic Signature(s) Signed: 06/15/2022 4:32:13 PM By:  Deon Pilling RN, BSN Entered By: Deon Pilling on 06/15/2022 10:07:06 [name] ([id number]) [id number] Page 2 of 4 -------------------------------------------------------------------------------- Education Screening Details Patient Name: Date of Service: Gavin Dennis, Gavin Dennis Gavin Dennis 06/15/2022 9:30 A M Medical Record Number: [id number] Patient Account Number: [id number] Date of Birth/Sex: Treating RN: 1950-12-14 (72 y.o. Gavin Dennis Primary Care Adah Stoneberg: Early Osmond Other Clinician: Referring Daisuke Bailey: Treating Bradleigh Sonnen/Extender: Kendrick Fries in Treatment: 0 Primary Learner Assessed: Patient Learning Preferences/Education Level/Primary Language Learning Preference: Explanation, Demonstration, Printed Material Highest Education Level: Grade School Preferred Language: English Cognitive Barrier Language Barrier: No Translator Needed: No Memory Deficit: No Emotional Barrier: No Cultural/Religious Beliefs Affecting Medical Care: Yes no vaccinations Physical Barrier Impaired Vision: No Impaired Hearing: No Decreased Hand dexterity: No Knowledge/Comprehension Knowledge Level: High Comprehension Level: High Ability to understand written instructions: High Ability to understand verbal instructions: High Motivation Anxiety Level: Calm Cooperation: Cooperative Education Importance: Acknowledges Need Interest in Health Problems: Asks Questions Perception: Coherent Willingness to Engage in Self-Management High Activities: Readiness to Engage in Self-Management High Activities: Electronic Signature(s) Signed: 06/15/2022 4:32:13 PM By: Deon Pilling RN, BSN Entered By: Deon Pilling on 06/15/2022 10:08:03 -------------------------------------------------------------------------------- Fall Risk Assessment Details Patient Name: Date of Service: Gavin Dennis, Gavin Dennis Gavin Dennis 06/15/2022 9:30 A M Medical Record Number: [id number] Patient  Account Number: [id number] Date of Birth/Sex: Treating RN: 1950-11-06 (72 y.o. Gavin Dennis Primary Care Lowen Mansouri: Early Osmond Other Clinician: Referring Tevan Marian: Treating Nunzio Banet/Extender: Young Berry, Rinka Weeks in Treatment: 0 Fall Risk Assessment Items Have you had 2 or more falls in the last 12 monthso 0 No LUCCA, BALLO ([id number]) [id number] Page 3 of 4 Have you had any fall that resulted in injury in the last 12 monthso 0 Yes FALLS RISK SCREEN History of falling - immediate or within 3 months 25 Yes Secondary diagnosis (Do you have 2 or more medical diagnoseso) 0 No Ambulatory aid None/bed rest/wheelchair/nurse  0 Yes Crutches/cane/walker 0 No Furniture 0 No Intravenous therapy Access/Saline/Heparin Lock 0 No Gait/Transferring Normal/ bed rest/ wheelchair 0 Yes Weak (short steps with or without shuffle, stooped but able to lift head while walking, may seek 0 No support from furniture) Impaired (short steps with shuffle, may have difficulty arising from chair, head down, impaired 0 No balance) Mental Status Oriented to own ability 0 Yes Electronic Signature(s) Signed: 06/15/2022 4:32:13 PM By: Deon Pilling RN, BSN Entered By: Deon Pilling on 06/15/2022 10:07:47 -------------------------------------------------------------------------------- Foot Assessment Details Patient Name: Date of Service: Gavin Dennis, Gavin Dennis Gavin Dennis 06/15/2022 9:30 A M Medical Record Number: [id number] Patient Account Number: [id number] Date of Birth/Sex: Treating RN: [birth year] (72 y.o. Gavin Dennis Primary Care Aaliyana Fredericks: Early Osmond Other Clinician: Referring Dacota Ruben: Treating Rayna Brenner/Extender: Young Berry, Rinka Weeks in Treatment: 0 Foot Assessment Items Site Locations + = Sensation present, - = Sensation absent, C = Callus, U = Ulcer R = Redness, W = Warmth, M = Maceration, PU = Pre-ulcerative lesion F = Fissure, S =  Swelling, D = Dryness Assessment Right: Left: Other Deformity: No No Prior Foot Ulcer: No No Prior Amputation: No No Charcot Joint: No No Ambulatory Status: Ambulatory Without Help GaitCARSEN, LEAF ([id number]) [phone].pdf Page 4 of 4 Electronic Signature(s) Signed: 06/15/2022 4:32:13 PM By: Deon Pilling RN, BSN Entered By: Deon Pilling on 06/15/2022 10:25:16 -------------------------------------------------------------------------------- Nutrition Risk Screening Details Patient Name: Date of Service: Gavin Dennis, Gavin Dennis Gavin Dennis 06/15/2022 9:30 A M Medical Record Number: [id number] Patient Account Number: [id number] Date of Birth/Sex: Treating RN: [birth year] (72 y.o. Gavin Dennis Primary Care Lashai Grosch: Early Osmond Other Clinician: Referring Tariya Morrissette: Treating Naje Rice/Extender: Young Berry, Rinka Weeks in Treatment: 0 Height (in): 71 Weight (lbs): 219 Body Mass Index (BMI): 30.5 Nutrition Risk Screening Items Score Screening NUTRITION RISK SCREEN: I have an illness or condition that made me change the kind and/or amount of food I eat 2 Yes I eat fewer than two meals per day 0 No I eat few fruits and vegetables, or milk products 0 No I have three or more drinks of beer, liquor or wine almost every day 0 No I have tooth or mouth problems that make it hard for me to eat 0 No I don't always have enough money to buy the food I need 0 No I eat alone most of the time 0 No I take three or more different prescribed or over-the-counter drugs a day 1 Yes Without wanting to, I have lost or gained 10 pounds in the last six months 0 No I am not always physically able to shop, cook and/or feed myself 0 No Nutrition Protocols Good Risk Protocol Provide education on elevated blood Moderate Risk Protocol 0 sugars and impact on wound healing, as applicable High Risk Proctocol Risk Level: Moderate Risk Score: 3 Electronic  Signature(s) Signed: 06/15/2022 4:32:13 PM By: Deon Pilling RN, BSN Entered By: Deon Pilling on 06/15/2022 10:08:18

## 2022-06-17 LAB — ANAEROBIC CULTURE W GRAM STAIN

## 2022-06-17 NOTE — Progress Notes (Signed)
Gavin Dennis, Gavin Dennis ([id number]) [id number] Page 1 of 9 Visit Report for 06/15/2022 Allergy List Details Patient Name: Date of Service: Gavin Dennis, Gavin Dennis 06/15/2022 9:30 A M Medical Record Number: [id number] Patient Account Number: [id number] Date of Birth/Sex: Treating RN: 08-01-1950 (72 y.o. Erie Noe Primary Care Chyan Carnero: Early Osmond Other Clinician: Referring Manessa Buley: Treating Latravion Graves/Extender: Young Berry, Rinka Weeks in Treatment: 0 Allergies Active Allergies No Known Allergies Allergy Notes Electronic Signature(s) Signed: 06/16/2022 12:08:46 PM By: Rhae Hammock RN Entered By: Rhae Hammock on 06/14/2022 16:38:04 -------------------------------------------------------------------------------- Arrival Information Details Patient Name: Date of Service: Gavin Dennis, Gavin Dennis 06/15/2022 9:30 A M Medical Record Number: [id number] Patient Account Number: [id number] Date of Birth/Sex: Treating RN: Jan 03, 1951 (72 y.o. M) Primary Care Lorisa Scheid: Early Osmond Other Clinician: Referring Fallyn Munnerlyn: Treating Dimond Crotty/Extender: Young Berry, Rinka Weeks in Treatment: 0 Visit Information Patient Arrived: Ambulatory Arrival Time: 09:56 Accompanied By: self Transfer Assistance: None Patient Identification Verified: Yes Secondary Verification Process Completed: Yes Patient Requires Transmission-Based Precautions: No Patient Has Alerts: No Electronic Signature(s) Signed: 06/15/2022 4:32:13 PM By: Deon Pilling RN, BSN Entered By: Deon Pilling on 06/15/2022 10:11:08 -------------------------------------------------------------------------------- Clinic Level of Care Assessment Details Patient Name: Date of Service: Gavin Dennis, Gavin Dennis 06/15/2022 9:30 A M Medical Record Number: [id number] Patient Account Number: [id number] Gavin Dennis, Gavin Dennis ([id number]) [phone].pdf Page 2 of 9 Date of Birth/Sex: Treating  RN: 08/02/1950 (72 y.o. [name] Primary Care Alexi Geibel: Other Clinician: Early Osmond Referring Allyn Bartelson: Treating Alanzo Lamb/Extender: Young Berry, Rinka Weeks in Treatment: 0 Clinic Level of Care Assessment Items TOOL 1 Quantity Score X- 1 0 Use when EandM and Procedure is performed on INITIAL visit ASSESSMENTS - Nursing Assessment / Reassessment X- 1 20 General Physical Exam (combine w/ comprehensive assessment (listed just below) when performed on new pt. evals) X- 1 25 Comprehensive Assessment (HX, ROS, Risk Assessments, Wounds Hx, etc.) ASSESSMENTS - Wound and Skin Assessment / Reassessment X- 1 10 Dermatologic / Skin Assessment (not related to wound area) ASSESSMENTS - Ostomy and/or Continence Assessment and Care []  - 0 Incontinence Assessment and Management []  - 0 Ostomy Care Assessment and Management (repouching, etc.) PROCESS - Coordination of Care X - Simple Patient / Family Education for ongoing care 1 15 []  - 0 Complex (extensive) Patient / Family Education for ongoing care X- 1 10 Staff obtains Programmer, systems, Records, T Results / Process Orders est X- 1 10 Staff telephones HHA, Nursing Homes / Clarify orders / etc []  - 0 Routine Transfer to another Facility (non-emergent condition) []  - 0 Routine Hospital Admission (non-emergent condition) X- 1 15 New Admissions / Biomedical engineer / Ordering NPWT Apligraf, etc. , []  - 0 Emergency Hospital Admission (emergent condition) PROCESS - Special Needs []  - 0 Pediatric / Minor Patient Management []  - 0 Isolation Patient Management []  - 0 Hearing / Language / Visual special needs []  - 0 Assessment of Community assistance (transportation, D/C planning, etc.) []  - 0 Additional assistance / Altered mentation []  - 0 Support Surface(s) Assessment (bed, cushion, seat, etc.) INTERVENTIONS - Miscellaneous []  - 0 External ear exam []  - 0 Patient Transfer (multiple staff / Civil Service fast streamer /  Similar devices) []  - 0 Simple Staple / Suture removal (25 or less) []  - 0 Complex Staple / Suture removal (26 or more) []  - 0 Hypo/Hyperglycemic Management (do not check if billed separately) X- 1 15 Ankle / Brachial Index (ABI) - do not check if billed separately Has the patient been seen at the hospital within the last three years: Yes  Total Score: 120 Level Of Care: New/Established - Level 4 Electronic Signature(s) Signed: 06/15/2022 4:32:13 PM By: Deon Pilling RN, BSN Entered By: Deon Pilling on 06/15/2022 14:00:38 Gavin Dennis ([id number]) [id number] Page 3 of 9 -------------------------------------------------------------------------------- Compression Therapy Details Patient Name: Date of Service: Gavin Dennis, Gavin Dennis 06/15/2022 9:30 A M Medical Record Number: [id number] Patient Account Number: [id number] Date of Birth/Sex: Treating RN: 04/25/1951 (72 y.o. [name] Primary Care Margarine Grosshans: Early Osmond Other Clinician: Referring Sharlyn Odonnel: Treating Lenna Hagarty/Extender: Young Berry, Rinka Weeks in Treatment: 0 Compression Therapy Performed for Wound Assessment: Wound #1 Right,Anterior Lower Leg Performed By: Clinician Deon Pilling, RN Compression Type: Three Layer Post Procedure Diagnosis Same as Pre-procedure Electronic Signature(s) Signed: 06/15/2022 4:32:13 PM By: Deon Pilling RN, BSN Entered By: Deon Pilling on 06/15/2022 10:48:00 -------------------------------------------------------------------------------- Encounter Discharge Information Details Patient Name: Date of Service: Gavin Dennis, Gavin Dennis 06/15/2022 9:30 A M Medical Record Number: [id number] Patient Account Number: [id number] Date of Birth/Sex: Treating RN: Apr 30, 1951 (72 y.o. [name] Primary Care Chuck Caban: Early Osmond Other Clinician: Referring Imri Lor: Treating Aiyah Scarpelli/Extender: Young Berry, Rinka Weeks in Treatment: 0 Encounter Discharge  Information Items Post Procedure Vitals Discharge Condition: Stable Temperature (F): 97.7 Ambulatory Status: Ambulatory Pulse (bpm): 70 Discharge Destination: Home Respiratory Rate (breaths/min): 20 Transportation: Private Auto Blood Pressure (mmHg): 180/106 Accompanied By: self Schedule Follow-up Appointment: Yes Clinical Summary of Care: Electronic Signature(s) Signed: 06/15/2022 4:32:13 PM By: Deon Pilling RN, BSN Entered By: Deon Pilling on 06/15/2022 14:00:07 -------------------------------------------------------------------------------- Lower Extremity Assessment Details Patient Name: Date of Service: Gavin Dennis, Gavin Dennis 06/15/2022 9:30 A M Medical Record Number: [id number] Patient Account Number: [id number] Date of Birth/Sex: Treating RN: 08-Aug-1950 (72 y.o. [name] Primary Care Nikodem Leadbetter: Early Osmond Other Clinician: Referring Tymier Lindholm: Treating Adaliah Hiegel/Extender: Young Berry, Rinka Weeks in Treatment: 0 Edema Assessment Assessed: [Left: No] [Right: Yes] Edema: [Left: Ye] [Right: s] Calf MYRAN, ARCIA ([id number]) [id number] Page 4 of 9 Left: Right: Point of Measurement: 42 cm From Medial Instep 46 cm Ankle Left: Right: Point of Measurement: 14 cm From Medial Instep 25.5 cm Knee To Floor Left: Right: From Medial Instep 52 cm Vascular Assessment Pulses: Dorsalis Pedis Palpable: [Right:Yes] Doppler Audible: [Right:Yes] Posterior Tibial Palpable: [Right:Yes] Doppler Audible: [Right:Yes] Blood Pressure: Brachial: [Right:180] Ankle: [Right:Dorsalis Pedis: 180 1.00] Electronic Signature(s) Signed: 06/15/2022 4:32:13 PM By: Deon Pilling RN, BSN Entered By: Deon Pilling on 06/15/2022 10:20:41 -------------------------------------------------------------------------------- Multi Wound Chart Details Patient Name: Date of Service: Gavin Dennis, Gavin Dennis 06/15/2022 9:30 A M Medical Record Number: [id number] Patient Account  Number: [id number] Date of Birth/Sex: Treating RN: 1950/06/16 (72 y.o. M) Primary Care Harriette Tovey: Early Osmond Other Clinician: Referring Kamayah Pillay: Treating Goldie Tregoning/Extender: Young Berry, Rinka Weeks in Treatment: 0 Vital Signs Height(in): 71 Pulse(bpm): 70 Weight(lbs): 219 Blood Pressure(mmHg): 180/106 Body Mass Index(BMI): 30.5 Temperature(F): 97.7 Respiratory Rate(breaths/min): 20 [1:Photos:] [N/A:N/A] Right, Anterior Lower Leg N/A N/A Wound Location: Blister N/A N/A Wounding Event: Venous Leg Ulcer N/A N/A Primary Etiology: Diabetic Wound/Ulcer of the Lower N/A N/A Secondary Etiology: Extremity Hypertension, Type II Diabetes N/A N/A Comorbid History: 04/11/2022 N/A N/A Date Acquired: 0 N/A N/A Weeks of Treatment: Open N/A N/A Wound Status: No N/A N/A Wound RecurrenceKYRUS, Gavin Dennis ([id number]) [id number] Page 5 of 9 Yes N/A N/A Clustered Wound: 8 N/A N/A Clustered Quantity: 9x13x0.1 N/A N/A Measurements L x W x D (cm) 91.892 N/A N/A A (cm) : rea 9.189 N/A N/A Volume (cm) : Full Thickness Without Exposed N/A N/A Classification: Support Structures Medium N/A N/A Exudate A mount: Serosanguineous  N/A N/A Exudate Type: red, brown N/A N/A Exudate Color: Distinct, outline attached N/A N/A Wound Margin: Medium (34-66%) N/A N/A Granulation A mount: Red, Pink N/A N/A Granulation Quality: Medium (34-66%) N/A N/A Necrotic A mount: Eschar, Adherent Slough N/A N/A Necrotic Tissue: Fat Layer (Subcutaneous Tissue): Yes N/A N/A Exposed Structures: Fascia: No Tendon: No Muscle: No Joint: No Bone: No Small (1-33%) N/A N/A Epithelialization: Chemical/Enzymatic/Mechanical N/A N/A Debridement: Lidocaine 4% T opical Solution N/A N/A Pain Control: N/A N/A N/A Instrument: None N/A N/A Bleeding: 0 N/A N/A Procedural Pain: 0 N/A N/A Post Procedural Pain: Debridement Treatment Response: Procedure was tolerated  well N/A N/A Post Debridement Measurements L x 9x13x0.1 N/A N/A W x D (cm) 9.189 N/A N/A Post Debridement Volume: (cm) Excoriation: No N/A N/A Periwound Skin Texture: Induration: No Callus: No Crepitus: No Rash: No Scarring: No Maceration: No N/A N/A Periwound Skin Moisture: Dry/Scaly: No Hemosiderin Staining: Yes N/A N/A Periwound Skin Color: Atrophie Blanche: No Cyanosis: No Ecchymosis: No Erythema: No Mottled: No Pallor: No Rubor: No Compression Therapy N/A N/A Procedures Performed: Debridement Treatment Notes Electronic Signature(s) Signed: 06/15/2022 12:26:40 PM By: Kalman Shan DO Entered By: Kalman Shan on 06/15/2022 10:49:17 -------------------------------------------------------------------------------- Multi-Disciplinary Care Plan Details Patient Name: Date of Service: Gavin Dennis, KAHL Dennis 06/15/2022 9:30 A M Medical Record Number: [id number] Patient Account Number: [id number] Date of Birth/Sex: Treating RN: [birth year] (72 y.o. [name] Primary Care Hatsumi Steinhart: Early Osmond Other Clinician: Referring Iyah Laguna: Treating Tierria Watson/Extender: Young Berry, Rinka Weeks in Treatment: 0 Active Inactive Orientation to the Wound Care Program Nursing Diagnoses: SHERWOOD, CASTILLA ([id number]) [id number] Page 6 of 9 Knowledge deficit related to the wound healing center program Goals: Patient/caregiver will verbalize understanding of the [place] Date Initiated: 06/15/2022 Target Resolution Date: 07/07/2022 Goal Status: Active Interventions: Provide education on orientation to the wound center Notes: Venous Leg Ulcer Nursing Diagnoses: Knowledge deficit related to disease process and management Potential for venous Insuffiency (use before diagnosis confirmed) Goals: Patient will maintain optimal edema control Date Initiated: 06/15/2022 Target Resolution Date: 07/07/2022 Goal Status:  Active Interventions: Assess peripheral edema status every visit. Compression as ordered Provide education on venous insufficiency Notes: Wound/Skin Impairment Nursing Diagnoses: Knowledge deficit related to ulceration/compromised skin integrity Goals: Patient/caregiver will verbalize understanding of skin care regimen Date Initiated: 06/15/2022 Target Resolution Date: 07/06/2022 Goal Status: Active Interventions: Assess patient/caregiver ability to perform ulcer/skin care regimen upon admission and as needed Assess ulceration(s) every visit Provide education on ulcer and skin care Treatment Activities: Skin care regimen initiated : 06/15/2022 Topical wound management initiated : 06/15/2022 Notes: Electronic Signature(s) Signed: 06/15/2022 4:32:13 PM By: Deon Pilling RN, BSN Entered By: Deon Pilling on 06/15/2022 10:45:39 -------------------------------------------------------------------------------- Pain Assessment Details Patient Name: Date of Service: DORIAN, DUVAL Dennis 06/15/2022 9:30 A M Medical Record Number: [id number] Patient Account Number: [id number] Date of Birth/Sex: Treating RN: 19-May-1950 (72 y.o. M) Primary Care Luvena Wentling: Early Osmond Other Clinician: Referring Sapna Padron: Treating Damarion Mendizabal/Extender: Young Berry, Rinka Weeks in Treatment: 0 Active Problems Location of Pain Severity and Description of Pain Patient Has [address] [place], [place] ([id number]) [id number] Page 7 of 9 Site Locations Rate the pain. Current Pain Level: 10 Worst Pain Level: 10 Least Pain Level: 0 Tolerable Pain Level: 2 Pain Management and Medication Current Pain Management: Electronic Signature(s) Signed: 06/15/2022 10:51:25 AM By: Worthy Rancher Entered By: Worthy Rancher on 06/15/2022 09:57:40 -------------------------------------------------------------------------------- Patient/Caregiver Education Details Patient Name: Date of  Service: Maryellen Pile Dennis 2/15/2024andnbsp9:30 Charles Record Number: [id number]  Patient Account Number: [id number] Date of Birth/Gender: Treating RN: [birth year] (72 y.o. [name] Primary Care Physician: Early Osmond Other Clinician: Referring Physician: Treating Physician/Extender: Kendrick Fries in Treatment: 0 Education Assessment Education Provided To: Patient Education Topics Provided Wound/Skin Impairment: Handouts: Caring for Your Ulcer Methods: Explain/Verbal Responses: Reinforcements needed Electronic Signature(s) Signed: 06/15/2022 4:32:13 PM By: Deon Pilling RN, BSN Entered By: Deon Pilling on 06/15/2022 10:45:46 -------------------------------------------------------------------------------- Wound Assessment Details Patient Name: Date of Service: ALMA, MUEGGE Dennis 06/15/2022 9:30 A M Medical Record Number: [id number] Patient Account Number: [id number] Date of Birth/Sex: Treating RN: 09-22-1950 (72 y.o. [name] [place], Jeneen Rinks ([id number]) [id number] Page 8 of 9 Primary Care Arlesia Kiel: Early Osmond Other Clinician: Referring Debbera Wolken: Treating Kieran Nachtigal/Extender: Young Berry, Rinka Weeks in Treatment: 0 Wound Status Wound Number: 1 Primary Etiology: Venous Leg Ulcer Wound Location: Right, Anterior Lower Leg Secondary Etiology: Diabetic Wound/Ulcer of the Lower Extremity Wounding Event: Blister Wound Status: Open Date Acquired: 04/11/2022 Comorbid History: Hypertension, Type II Diabetes Weeks Of Treatment: 0 Clustered Wound: Yes Photos Wound Measurements Length: (cm) 9 Width: (cm) 13 Depth: (cm) 0.1 Clustered Quantity: 8 Area: (cm) 91.892 Volume: (cm) 9.189 % Reduction in Area: % Reduction in Volume: Epithelialization: Small (1-33%) Tunneling: No Undermining: No Wound Description Classification: Full Thickness Without Exposed Sup Wound Margin: Distinct, outline  attached Exudate Amount: Medium Exudate Type: Serosanguineous Exudate Color: red, brown port Structures Foul Odor After Cleansing: No Slough/Fibrino Yes Wound Bed Granulation Amount: Medium (34-66%) Exposed Structure Granulation Quality: Red, Pink Fascia Exposed: No Necrotic Amount: Medium (34-66%) Fat Layer (Subcutaneous Tissue) Exposed: Yes Necrotic Quality: Eschar, Adherent Slough Tendon Exposed: No Muscle Exposed: No Joint Exposed: No Bone Exposed: No Periwound Skin Texture Texture Color No Abnormalities Noted: No No Abnormalities Noted: No Callus: No Atrophie Blanche: No Crepitus: No Cyanosis: No Excoriation: No Ecchymosis: No Induration: No Erythema: No Rash: No Hemosiderin Staining: Yes Scarring: No Mottled: No Pallor: No Moisture Rubor: No No Abnormalities Noted: No Dry / Scaly: No Maceration: No Treatment Notes Wound #1 (Lower Leg) Wound Laterality: Right, Anterior Cleanser Soap and Water Discharge Instruction: May shower and wash wound with dial antibacterial soap and water prior to dressing change. Vashe 5.8 (oz) Discharge Instruction: Cleanse the wound with Vashe prior to applying a clean dressing using gauze sponges, not tissue or cotton balls. DERVIN, VORE ([id number]) [id number] Page 9 of 9 Peri-Wound Care Sween Lotion (Moisturizing lotion) Discharge Instruction: Apply moisturizing lotion as directed Topical Gentamicin Discharge Instruction: As directed by physician Mupirocin Ointment Discharge Instruction: Apply Mupirocin (Bactroban) as instructed Primary Dressing Hydrofera Blue Ready Transfer Foam, 8x8 (in/in) Discharge Instruction: Apply to wound bed as instructed Secondary Dressing ABD Pad, 8x10 Discharge Instruction: Apply over primary dressing as directed. Secured With Compression Wrap ThreePress (3 layer compression wrap) Discharge Instruction: Apply three layer compression as directed. Compression  Stockings Add-Ons Electronic Signature(s) Signed: 06/15/2022 4:32:13 PM By: Deon Pilling RN, BSN Entered By: Deon Pilling on 06/15/2022 10:24:37 -------------------------------------------------------------------------------- Vitals Details Patient Name: Date of Service: TATEM, FESLER Dennis 06/15/2022 9:30 A M Medical Record Number: [id number] Patient Account Number: [id number] Date of Birth/Sex: Treating RN: [birth year] (72 y.o. M) Primary Care Dowell Hoon: Early Osmond Other Clinician: Referring Cheila Wickstrom: Treating Juanita Devincent/Extender: Young Berry, Rinka Weeks in Treatment: 0 Vital Signs Time Taken: 09:56 Temperature (F): 97.7 Height (in): 71 Pulse (bpm): 70 Weight (lbs): 219 Respiratory Rate (breaths/min): 20 Body Mass Index (BMI): 30.5 Blood Pressure (mmHg): 180/106 Reference Range: 80 - 120 mg / dl Electronic Signature(s) Signed:  06/15/2022 10:51:25 AM By: Worthy Rancher Entered By: Worthy Rancher on 06/15/2022 09:57:12

## 2022-06-17 NOTE — Progress Notes (Signed)
MINAS, BONSER ([id number]) [id number] Page 1 of 9 Visit Report for 06/15/2022 Chief Complaint Document Details Patient Name: Date of Service: Gavin Dennis, Gavin Dennis MES 06/15/2022 9:30 A M Medical Record Number: [id number] Patient Account Number: [id number] Date of Birth/Sex: Treating RN: [birth year] (72 y.o. M) Primary Care Provider: Early Osmond Other Clinician: Referring Provider: Treating Provider/Extender: Young Berry, Rinka Weeks in Treatment: 0 Information Obtained from: Patient Chief Complaint 06/15/2022; right lower extremity wounds Electronic Signature(s) Signed: 06/15/2022 12:26:40 PM By: Kalman Shan DO Entered By: Kalman Shan on 06/15/2022 10:49:41 -------------------------------------------------------------------------------- Debridement Details Patient Name: Date of Service: Gavin Dennis, Gavin Dennis MES 06/15/2022 9:30 A M Medical Record Number: [id number] Patient Account Number: [id number] Date of Birth/Sex: Treating RN: 06-Dec-1950 (72 y.o. Gavin Dennis Primary Care Provider: Early Osmond Other Clinician: Referring Provider: Treating Provider/Extender: Young Berry, Rinka Weeks in Treatment: 0 Debridement Performed for Assessment: Wound #1 Right,Anterior Lower Leg Performed By: Physician Kalman Shan, DO Debridement Type: Chemical/Enzymatic/Mechanical Agent Used: gauze and wound cleanser Severity of Tissue Pre Debridement: Fat layer exposed Level of Consciousness (Pre-procedure): Awake and Alert Pre-procedure Verification/Time Out No Taken: Pain Control: Lidocaine 4% Topical Solution Bleeding: None Procedural Pain: 0 Post Procedural Pain: 0 Response to Treatment: Procedure was tolerated well Level of Consciousness (Post- Awake and Alert procedure): Post Debridement Measurements of Total Wound Length: (cm) 9 Width: (cm) 13 Depth: (cm) 0.1 Volume: (cm) 9.189 Character of Wound/Ulcer Post Debridement:  Stable Severity of Tissue Post Debridement: Fat layer exposed Post Procedure Diagnosis Same as Pre-procedure Electronic Signature(s) Signed: 06/15/2022 12:26:40 PM By: Gavin Dennis ([id number]) [id number] Page 2 of 9 Signed: 06/15/2022 4:32:13 PM By: Deon Pilling RN, BSN Entered By: Deon Pilling on 06/15/2022 10:47:50 -------------------------------------------------------------------------------- HPI Details Patient Name: Date of Service: Gavin Dennis, Gavin Dennis MES 06/15/2022 9:30 A M Medical Record Number: [id number] Patient Account Number: [id number] Date of Birth/Sex: Treating RN: Apr 14, 1951 (72 y.o. M) Primary Care Provider: Early Osmond Other Clinician: Referring Provider: Treating Provider/Extender: Young Berry, Rinka Weeks in Treatment: 0 History of Present Illness HPI Description: 06/15/2022 [name] is a 72 year old male with a past medical history of lymphedema, CVA, hypertension and controlled type 2 diabetes that presents to the clinic for a 73-month history of waxing and waning of wounds to his right lower extremity that happened spontaneously. He has been keeping the wound areas covered. He does not wear compression stockings. He denies signs of infection. Electronic Signature(s) Signed: 06/15/2022 12:26:40 PM By: Kalman Shan DO Entered By: Kalman Shan on 06/15/2022 10:51:03 -------------------------------------------------------------------------------- Physical Exam Details Patient Name: Date of Service: Gavin Dennis, Gavin Dennis MES 06/15/2022 9:30 A M Medical Record Number: [id number] Patient Account Number: [id number] Date of Birth/Sex: Treating RN: 09-17-1950 (72 y.o. M) Primary Care Provider: Early Osmond Other Clinician: Referring Provider: Treating Provider/Extender: Young Berry, Rinka Weeks in Treatment: 0 Constitutional respirations regular, non-labored and within target range for  patient.. Cardiovascular 2+ dorsalis pedis/posterior tibialis pulses. Psychiatric pleasant and cooperative. Notes Right lower extremity: Scattered open wounds with granulation tissue. 3+ pitting edema to the knee. No signs of surrounding soft tissue infection including increased warmth, erythema or purulent drainage. Electronic Signature(s) Signed: 06/15/2022 12:26:40 PM By: Kalman Shan DO Entered By: Kalman Shan on 06/15/2022 10:52:00 [name] ([id number]) [id number] Page 3 of 9 -------------------------------------------------------------------------------- Physician Orders Details Patient Name: Date of Service: Gavin Dennis, Gavin Dennis MES 06/15/2022 9:30 A M Medical Record Number: [id number] Patient Account Number: [id number] Date of Birth/Sex: Treating RN: 07/19/1950 (72 y.o. Gavin Dennis Primary Care Provider: Early Osmond Other Clinician:  Referring Provider: Treating Provider/Extender: Young Berry, Rinka Weeks in Treatment: 0 Verbal / Phone Orders: No Diagnosis Coding ICD-10 Coding Code Description I87.311 Chronic venous hypertension (idiopathic) with ulcer of right lower extremity L97.812 Non-pressure chronic ulcer of other part of right lower leg with fat layer exposed I89.0 Lymphedema, not elsewhere classified E11.622 Type 2 diabetes mellitus with other skin ulcer Follow-up Appointments ppointment in 1 week. - Dr. Heber [place] Return A Other: - Plan to order compression stockings. Anesthetic (In clinic) Topical Lidocaine 4% applied to wound bed Bathing/ Shower/ Hygiene May shower with protection but do not get wound dressing(s) wet. Protect dressing(s) with water repellant cover (for example, large plastic bag) or a cast cover and may then take shower. Edema Control - Lymphedema / SCD / Other Elevate legs to the level of the heart or above for 30 minutes daily and/or when sitting for 3-4 times a day throughout the day. A void  standing for long periods of time. Exercise regularly If compression wraps slide down please call wound center and speak with a nurse. Wound Treatment Wound #1 - Lower Leg Wound Laterality: Right, Anterior Cleanser: Soap and Water 1 x Per Week/30 Days Discharge Instructions: May shower and wash wound with dial antibacterial soap and water prior to dressing change. Cleanser: Vashe 5.8 (oz) 1 x Per Week/30 Days Discharge Instructions: Cleanse the wound with Vashe prior to applying a clean dressing using gauze sponges, not tissue or cotton balls. Peri-Wound Care: Sween Lotion (Moisturizing lotion) 1 x Per Week/30 Days Discharge Instructions: Apply moisturizing lotion as directed Topical: Gentamicin 1 x Per Week/30 Days Discharge Instructions: As directed by physician Topical: Mupirocin Ointment 1 x Per Week/30 Days Discharge Instructions: Apply Mupirocin (Bactroban) as instructed Prim Dressing: Hydrofera Blue Ready Transfer Foam, 8x8 (in/in) 1 x Per Week/30 Days ary Discharge Instructions: Apply to wound bed as instructed Secondary Dressing: ABD Pad, 8x10 1 x Per Week/30 Days Discharge Instructions: Apply over primary dressing as directed. Compression Wrap: ThreePress (3 layer compression wrap) 1 x Per Week/30 Days Discharge Instructions: Apply three layer compression as directed. Patient Medications llergies: No Known Allergies A Notifications Medication Indication Start End applied only in clinic for lidocaine debridements. DOSE topical 4 % cream - cream topical once daily Electronic Signature(s) KAVIN, WECKWERTH ([id number]) [id number] Page 4 of 9 Signed: 06/15/2022 12:26:40 PM By: Kalman Shan DO Entered By: Kalman Shan on 06/15/2022 10:53:08 -------------------------------------------------------------------------------- Problem List Details Patient Name: Date of Service: Gavin Dennis, Gavin Dennis MES 06/15/2022 9:30 A M Medical Record Number:  [id number] Patient Account Number: [id number] Date of Birth/Sex: Treating RN: Apr 12, 1951 (72 y.o. M) Primary Care Provider: Early Osmond Other Clinician: Referring Provider: Treating Provider/Extender: Young Berry, Rinka Weeks in Treatment: 0 Active Problems ICD-10 Encounter Code Description Active Date MDM Diagnosis I87.311 Chronic venous hypertension (idiopathic) with ulcer of right lower extremity 06/15/2022 No Yes L97.812 Non-pressure chronic ulcer of other part of right lower leg with fat layer 06/15/2022 No Yes exposed I89.0 Lymphedema, not elsewhere classified 06/15/2022 No Yes E11.622 Type 2 diabetes mellitus with other skin ulcer 06/15/2022 No Yes Inactive Problems Resolved Problems Electronic Signature(s) Signed: 06/15/2022 12:26:40 PM By: Kalman Shan DO Entered By: Kalman Shan on 06/15/2022 10:44:43 -------------------------------------------------------------------------------- Progress Note Details Patient Name: Date of Service: Gavin Dennis, Gavin Dennis MES 06/15/2022 9:30 A M Medical Record Number: [id number] Patient Account Number: [id number] Date of Birth/Sex: Treating RN: Apr 18, 1951 (72 y.o. M) Primary Care Provider: Early Osmond Other Clinician: Referring Provider: Treating Provider/Extender: Young Berry, Rinka Weeks in Treatment: 0 Subjective  Chief Complaint Information obtained from Patient 06/15/2022; right lower extremity wounds Gavin Dennis, Gavin Dennis ([id number]) [id number] Page 5 of 9 History of Present Illness (HPI) 06/15/2022 Mr. Eliakim Tendler is a 72 year old male with a past medical history of lymphedema, CVA, hypertension and controlled type 2 diabetes that presents to the clinic for a 1-month history of waxing and waning of wounds to his right lower extremity that happened spontaneously. He has been keeping the wound areas covered. He does not wear compression stockings. He denies signs of infection. Patient  History Information obtained from Patient, Chart. Allergies No Known Allergies Family History Unknown History. Social History Former smoker - ended on 05/02/2019, Marital Status - Single, Alcohol Use - Never, Drug Use - No History, Caffeine Use - Rarely. Medical History Cardiovascular Patient has history of Hypertension Endocrine Patient has history of Type II Diabetes Patient is treated with Oral Agents. Blood sugar is not tested. Hospitalization/Surgery History - stroke 2022,2023. Medical A Surgical History Notes nd Constitutional Symptoms (General Health) stroke 2022, 2023 Respiratory pulmonary nodules mediastinal lymphadenopathy Cardiovascular Hyperlipidemia thoracic aortic aneurysm Musculoskeletal polio (in childhood) Neurologic Degenerative Disc Disease, CVA Review of Systems (ROS) Constitutional Symptoms (General Health) Denies complaints or symptoms of Fatigue, Fever, Chills, Marked Weight Change. Eyes Complains or has symptoms of Vision Changes - blurred vision left eye since stroke in 22. Denies complaints or symptoms of Dry Eyes, Glasses / Contacts. Ear/Nose/Mouth/Throat Denies complaints or symptoms of Chronic sinus problems or rhinitis. Respiratory Denies complaints or symptoms of Chronic or frequent coughs, Shortness of Breath. Gastrointestinal Denies complaints or symptoms of Frequent diarrhea, Nausea, Vomiting. Integumentary (Skin) Complains or has symptoms of Wounds. Psychiatric Denies complaints or symptoms of Claustrophobia. Objective Constitutional respirations regular, non-labored and within target range for patient.. Vitals Time Taken: 9:56 AM, Height: 71 in, Weight: 219 lbs, BMI: 30.5, Temperature: 97.7 F, Pulse: 70 bpm, Respiratory Rate: 20 breaths/min, Blood Pressure: 180/106 mmHg. Cardiovascular 2+ dorsalis pedis/posterior tibialis pulses. Psychiatric pleasant and cooperative. General Notes: Right lower extremity: Scattered open wounds  with granulation tissue. 3+ pitting edema to the knee. No signs of surrounding soft tissue infection including increased warmth, erythema or purulent drainage. Integumentary (Hair, Skin) Wound #1 status is Open. Original cause of wound was Blister. The date acquired was: 04/11/2022. The wound is located on the Right,Anterior Lower Leg. The wound measures 9cm length x 13cm width x 0.1cm depth; 91.892cm^2 area and 9.189cm^3 volume. There is Fat Layer (Subcutaneous Tissue) exposed. There is no tunneling or undermining noted. There is a medium amount of serosanguineous drainage noted. The wound margin is distinct with the outline attached to Gavin Dennis, Gavin Dennis ([id number]) [id number] Page 6 of 9 the wound base. There is medium (34-66%) red, pink granulation within the wound bed. There is a medium (34-66%) amount of necrotic tissue within the wound bed including Eschar and Adherent Slough. The periwound skin appearance exhibited: Hemosiderin Staining. The periwound skin appearance did not exhibit: Callus, Crepitus, Excoriation, Induration, Rash, Scarring, Dry/Scaly, Maceration, Atrophie Blanche, Cyanosis, Ecchymosis, Mottled, Pallor, Rubor, Erythema. 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 His ABIs in office were 1.0 on the right and he has strong pedal pulses suggesting adequate blood flow for healing. At this time I recommended  antibiotic ointment and Hydrofera Blue under 3 layer compression. He knows to not get this wrap wet or keep this on for more than 1 week. At next clinic visit we will obtain  leg measurements so he can order compression stockings. He knows he will need these to wear daily after his wounds are healed. Procedures Wound #1 Pre-procedure diagnosis of Wound #1 is a Venous Leg Ulcer located on the Right,Anterior Lower Leg .Severity of Tissue Pre Debridement is: Fat layer exposed. There was a Chemical/Enzymatic/Mechanical debridement performed by Kalman Shan, DO. after achieving pain control using Lidocaine 4% Topical Solution. Other agent used was gauze and wound cleanser. There was no bleeding. The procedure was tolerated well with a pain level of 0 throughout and a pain level of 0 following the procedure. Post Debridement Measurements: 9cm length x 13cm width x 0.1cm depth; 9.189cm^3 volume. Character of Wound/Ulcer Post Debridement is stable. Severity of Tissue Post Debridement is: Fat layer exposed. Post procedure Diagnosis Wound #1: Same as Pre-Procedure Pre-procedure diagnosis of Wound #1 is a Venous Leg Ulcer located on the Right,Anterior Lower Leg . There was a Three Layer Compression Therapy Procedure by Deon Pilling, RN. Post procedure Diagnosis Wound #1: Same as Pre-Procedure Plan Follow-up Appointments: Return Appointment in 1 week. - Dr. Heber [place] Other: - Plan to order compression stockings. Anesthetic: (In clinic) Topical Lidocaine 4% applied to wound bed Bathing/ Shower/ Hygiene: May shower with protection but do not get wound dressing(s) wet. Protect dressing(s) with water repellant cover (for example, large plastic bag) or a cast cover and may then take shower. Edema Control - Lymphedema / SCD / Other: Elevate legs to the level of the heart or above for 30 minutes daily and/or when sitting for 3-4 times a day throughout the day. Avoid standing for long periods of time. Exercise regularly If compression wraps slide down please call wound center and speak with a nurse. The following medication(s) was prescribed: lidocaine topical 4 % cream cream  topical once daily for applied only in clinic for debridements. was prescribed at facility WOUND #1: - Lower Leg Wound Laterality: Right, Anterior Cleanser: Soap and Water 1 x Per Week/30 Days Discharge Instructions: May shower and wash wound with dial antibacterial soap and water prior to dressing change. Cleanser: Vashe 5.8 (oz) 1 x Per Week/30 Days Discharge Instructions: Cleanse the wound with Vashe prior to applying a clean dressing using gauze sponges, not tissue or cotton balls. Peri-Wound Care: Sween Lotion (Moisturizing lotion) 1 x Per Week/30 Days Discharge Instructions: Apply moisturizing lotion as directed Topical: Gentamicin 1 x Per Week/30 Days Discharge Instructions: As directed by physician Topical: Mupirocin Ointment 1 x Per Week/30 Days Discharge Instructions: Apply Mupirocin (Bactroban) as instructed Prim Dressing: Hydrofera Blue Ready Transfer Foam, 8x8 (in/in) 1 x Per Week/30 Days ary Discharge Instructions: Apply to wound bed as instructed Secondary Dressing: ABD Pad, 8x10 1 x Per Week/30 Days Discharge Instructions: Apply over primary dressing as directed. Com pression Wrap: ThreePress (3 layer compression wrap) 1 x Per Week/30 Days Discharge Instructions: Apply three layer compression as directed. 1. Hydrofera Blue with antibiotic ointment under 3 layer compressionooright lower extremity 2. Follow-up in 1 week Gavin Dennis, Gavin Dennis ([id number]) [id number] Page 7 of 9 Electronic Signature(s) Signed: 06/15/2022 12:26:40 PM By: Kalman Shan DO Entered By: Kalman Shan on 06/15/2022 10:58:46 -------------------------------------------------------------------------------- HxROS Details Patient Name: Date of Service: Gavin Dennis, Gavin Dennis MES 06/15/2022 9:30 A M Medical Record Number: [id number] Patient Account Number: [id number] Date of Birth/Sex: Treating RN: 11-22-1950 (72 y.o.  [place] Noe Primary Care Provider: Early Osmond Other  Clinician: Referring Provider: Treating Provider/Extender: Young Berry, Rinka Weeks in Treatment: 0 Information Obtained From Patient Chart Constitutional Symptoms (General Health) Complaints and Symptoms: Negative for: Fatigue; Fever; Chills; Marked Weight Change Medical History: Past Medical History Notes: stroke 2022, 2023 Eyes Complaints and Symptoms: Positive for: Vision Changes - blurred vision left eye since stroke in 22 Negative for: Dry Eyes; Glasses / Contacts Ear/Nose/Mouth/Throat Complaints and Symptoms: Negative for: Chronic sinus problems or rhinitis Respiratory Complaints and Symptoms: Negative for: Chronic or frequent coughs; Shortness of Breath Medical History: Past Medical History Notes: pulmonary nodules mediastinal lymphadenopathy Gastrointestinal Complaints and Symptoms: Negative for: Frequent diarrhea; Nausea; Vomiting Integumentary (Skin) Complaints and Symptoms: Positive for: Wounds Psychiatric Complaints and Symptoms: Negative for: Claustrophobia Hematologic/Lymphatic Cardiovascular Medical History: Positive for: Hypertension Past Medical History NotesBRAE, GARTMAN ([id number]) [id number] Page 8 of 9 Hyperlipidemia thoracic aortic aneurysm Endocrine Medical History: Positive for: Type II Diabetes Time with diabetes: 1 year Treated with: Oral agents Blood sugar tested every day: No Immunological Musculoskeletal Medical History: Past Medical History Notes: polio (in childhood) Neurologic Medical History: Past Medical History Notes: Degenerative Disc Disease, CVA Oncologic Immunizations Pneumococcal Vaccine: Received Pneumococcal Vaccination: Yes Received Pneumococcal Vaccination On or After 60th Birthday: Yes Implantable Devices None Hospitalization / Surgery History Type of Hospitalization/Surgery stroke 2022,2023 Family and Social History Unknown History: Yes; Former smoker -  ended on 05/02/2019; Marital Status - Single; Alcohol Use: Never; Drug Use: No History; Caffeine Use: Rarely; Financial Concerns: No; Food, Clothing or Shelter Needs: No; Support System Lacking: No; Transportation Concerns: No Electronic Signature(s) Signed: 06/15/2022 12:26:40 PM By: Kalman Shan DO Signed: 06/15/2022 4:32:13 PM By: Deon Pilling RN, BSN Signed: 06/16/2022 12:08:46 PM By: Rhae Hammock RN Entered By: Deon Pilling on 06/15/2022 10:14:26 -------------------------------------------------------------------------------- SuperBill Details Patient Name: Date of Service: JAYTHAN, Gavin Dennis MES 06/15/2022 Medical Record Number: [id number] Patient Account Number: [id number] Date of Birth/Sex: Treating RN: 07/08/1950 (72 y.o. M) Primary Care Provider: Early Osmond Other Clinician: Referring Provider: Treating Provider/Extender: Young Berry, Rinka Weeks in Treatment: 0 Diagnosis Coding ICD-10 Codes Code Description I87.311 Chronic venous hypertension (idiopathic) with ulcer of right lower extremity L97.812 Non-pressure chronic ulcer of other part of right lower leg with fat layer exposed DIMETRIUS, MONTFORT ([id number]) [id number] Page 9 of 9 I89.0 Lymphedema, not elsewhere classified E11.622 Type 2 diabetes mellitus with other skin ulcer Facility Procedures : CPT4 Code: 62694854 Description: [place] VISIT-LEV 4 EST PT Modifier: Quantity: 1 : CPT4 Code: 62703500 Description: 93818 - [place] W/O ANES NON SELECT Modifier: Quantity: 1 Physician Procedures : CPT4 Code Description Modifier [id number] - WC PHYS LEVEL 4 - NEW PT ICD-10 Diagnosis Description I87.311 Chronic venous hypertension (idiopathic) with ulcer of right lower extremity L97.812 Non-pressure chronic ulcer of other part of right lower  leg with fat layer exposed I89.0 Lymphedema, not elsewhere classified E11.622 Type 2 diabetes mellitus with other skin  ulcer Quantity: 1 Electronic Signature(s) Signed: 06/15/2022 4:32:13 PM By: Deon Pilling RN, BSN Signed: 06/16/2022 10:13:15 AM By: Kalman Shan DO Previous Signature: 06/15/2022 12:26:40 PM Version By: Kalman Shan DO Entered By: Deon Pilling on 06/15/2022 14:00:48

## 2022-06-18 DIAGNOSIS — C3490 Malignant neoplasm of unspecified part of unspecified bronchus or lung: Secondary | ICD-10-CM | POA: Diagnosis not present

## 2022-06-19 ENCOUNTER — Other Ambulatory Visit: Payer: Self-pay | Admitting: Sports Medicine

## 2022-06-19 ENCOUNTER — Telehealth: Payer: Self-pay | Admitting: Internal Medicine

## 2022-06-19 ENCOUNTER — Ambulatory Visit
Admission: RE | Admit: 2022-06-19 | Discharge: 2022-06-19 | Disposition: A | Discharge status: Home / Self Care | Payer: Medicare HMO | Source: Ambulatory Visit | Attending: Sports Medicine | Admitting: Sports Medicine

## 2022-06-19 DIAGNOSIS — M1712 Unilateral primary osteoarthritis, left knee: Secondary | ICD-10-CM | POA: Diagnosis not present

## 2022-06-19 DIAGNOSIS — M25562 Pain in left knee: Secondary | ICD-10-CM

## 2022-06-19 DIAGNOSIS — M25462 Effusion, left knee: Secondary | ICD-10-CM | POA: Diagnosis not present

## 2022-06-19 NOTE — Telephone Encounter (Signed)
Scheduled appt per 2/13 referral. Pt is aware of appt date and time. Pt is aware to arrive 15 mins prior to appt time and to bring and updated insurance card. Pt is aware of appt location.

## 2022-06-20 ENCOUNTER — Encounter (HOSPITAL_COMMUNITY): Payer: Self-pay

## 2022-06-20 DIAGNOSIS — I700 Atherosclerosis of aorta: Secondary | ICD-10-CM | POA: Diagnosis not present

## 2022-06-20 DIAGNOSIS — E7800 Pure hypercholesterolemia, unspecified: Secondary | ICD-10-CM | POA: Diagnosis not present

## 2022-06-20 DIAGNOSIS — J439 Emphysema, unspecified: Secondary | ICD-10-CM | POA: Diagnosis not present

## 2022-06-20 DIAGNOSIS — H547 Unspecified visual loss: Secondary | ICD-10-CM | POA: Diagnosis not present

## 2022-06-20 DIAGNOSIS — I119 Hypertensive heart disease without heart failure: Secondary | ICD-10-CM | POA: Diagnosis not present

## 2022-06-20 DIAGNOSIS — L089 Local infection of the skin and subcutaneous tissue, unspecified: Secondary | ICD-10-CM | POA: Diagnosis not present

## 2022-06-20 DIAGNOSIS — I69398 Other sequelae of cerebral infarction: Secondary | ICD-10-CM | POA: Diagnosis not present

## 2022-06-20 DIAGNOSIS — E1169 Type 2 diabetes mellitus with other specified complication: Secondary | ICD-10-CM | POA: Diagnosis not present

## 2022-06-20 DIAGNOSIS — M1712 Unilateral primary osteoarthritis, left knee: Secondary | ICD-10-CM | POA: Diagnosis not present

## 2022-06-22 ENCOUNTER — Ambulatory Visit (HOSPITAL_BASED_OUTPATIENT_CLINIC_OR_DEPARTMENT_OTHER): Payer: Medicare HMO | Admitting: Internal Medicine

## 2022-06-22 NOTE — Telephone Encounter (Signed)
Patient needed peer to peer Approval for guardant testing was denied.  Can you please write a appeal letter stating that patient has diagnosis of adenocarcinoma by fine-needle aspiration of station 7 subcarinal lymph node.  That is not enough tissue for immunohistochemistry staining and hence guardant testing was ordered.  Appeal requested for approval of testing by insurance.  Please send a letter with cytology results to fax [phone]

## 2022-06-23 ENCOUNTER — Other Ambulatory Visit (HOSPITAL_COMMUNITY): Payer: Self-pay | Admitting: Internal Medicine

## 2022-06-23 ENCOUNTER — Encounter (HOSPITAL_BASED_OUTPATIENT_CLINIC_OR_DEPARTMENT_OTHER): Payer: Medicare HMO | Admitting: Internal Medicine

## 2022-06-23 ENCOUNTER — Other Ambulatory Visit: Payer: Self-pay | Admitting: *Deleted

## 2022-06-23 ENCOUNTER — Encounter: Payer: Self-pay | Admitting: *Deleted

## 2022-06-23 DIAGNOSIS — M1712 Unilateral primary osteoarthritis, left knee: Secondary | ICD-10-CM | POA: Diagnosis not present

## 2022-06-23 DIAGNOSIS — I87311 Chronic venous hypertension (idiopathic) with ulcer of right lower extremity: Secondary | ICD-10-CM

## 2022-06-23 DIAGNOSIS — H547 Unspecified visual loss: Secondary | ICD-10-CM | POA: Diagnosis not present

## 2022-06-23 DIAGNOSIS — I69398 Other sequelae of cerebral infarction: Secondary | ICD-10-CM | POA: Diagnosis not present

## 2022-06-23 DIAGNOSIS — E7800 Pure hypercholesterolemia, unspecified: Secondary | ICD-10-CM | POA: Diagnosis not present

## 2022-06-23 DIAGNOSIS — I119 Hypertensive heart disease without heart failure: Secondary | ICD-10-CM | POA: Diagnosis not present

## 2022-06-23 DIAGNOSIS — E11622 Type 2 diabetes mellitus with other skin ulcer: Secondary | ICD-10-CM

## 2022-06-23 DIAGNOSIS — E1169 Type 2 diabetes mellitus with other specified complication: Secondary | ICD-10-CM

## 2022-06-23 DIAGNOSIS — I700 Atherosclerosis of aorta: Secondary | ICD-10-CM | POA: Diagnosis not present

## 2022-06-23 DIAGNOSIS — I890 Lymphedema, not elsewhere classified: Secondary | ICD-10-CM

## 2022-06-23 DIAGNOSIS — J439 Emphysema, unspecified: Secondary | ICD-10-CM | POA: Diagnosis not present

## 2022-06-23 DIAGNOSIS — L089 Local infection of the skin and subcutaneous tissue, unspecified: Secondary | ICD-10-CM | POA: Diagnosis not present

## 2022-06-23 DIAGNOSIS — Z8673 Personal history of transient ischemic attack (TIA), and cerebral infarction without residual deficits: Secondary | ICD-10-CM | POA: Diagnosis not present

## 2022-06-23 DIAGNOSIS — R918 Other nonspecific abnormal finding of lung field: Secondary | ICD-10-CM

## 2022-06-23 DIAGNOSIS — L97812 Non-pressure chronic ulcer of other part of right lower leg with fat layer exposed: Secondary | ICD-10-CM

## 2022-06-23 NOTE — Telephone Encounter (Signed)
Appeal letter completed and faxed with cytology results to [facility] [phone]. Reference # [id number]. Confirmation received.  Nothing further at this time.

## 2022-06-24 NOTE — Progress Notes (Signed)
Gavin Dennis, Gavin Dennis ([id number]) [id number] Page 1 of 7 Visit Report for 06/23/2022 Chief Complaint Document Details Patient Name: Date of Service: Gavin Dennis, Gavin Dennis MES 06/23/2022 9:00 A M Medical Record Number: [id number] Patient Account Number: [id number] Date of Birth/Sex: Treating RN: 1950-07-19 (73 y.o. M) Primary Care Provider: Early Dennis Other Clinician: Referring Provider: Treating Provider/Extender: Gavin Dennis, Gavin Dennis in Treatment: 1 Information Obtained from: Patient Chief Complaint 06/15/2022; right lower extremity wounds Electronic Signature(s) Signed: 06/23/2022 12:04:24 PM By: Kalman Shan DO Entered By: Kalman Shan on 06/23/2022 09:54:10 -------------------------------------------------------------------------------- HPI Details Patient Name: Date of Service: Gavin Dennis, Gavin Dennis MES 06/23/2022 9:00 [place] Record Number: [id number] Patient Account Number: [id number] Date of Birth/Sex: Treating RN: [birth year] (72 y.o. M) Primary Care Provider: Early Dennis Other Clinician: Referring Provider: Treating Provider/Extender: Gavin Dennis, Gavin Dennis in Treatment: 1 History of Present Illness HPI Description: 06/15/2022 [name] is a 72 year old male with a past medical history of lymphedema, CVA, hypertension and controlled type 2 diabetes that presents to the clinic for a 33-monthhistory of waxing and waning of wounds to his right lower extremity that happened spontaneously. He has been keeping the wound areas covered. He does not wear compression stockings. He denies signs of infection. 2/23; patient presents for follow-up. We have been using antibiotic ointment with Hydrofera Blue under 3 layer compression. He tolerated the wrap well. There has been improvement in wound healing. Electronic Signature(s) Signed: 06/23/2022 12:04:24 PM By: HKalman ShanDO Entered By: HKalman Shanon 06/23/2022  09:55:34 -------------------------------------------------------------------------------- Physical Exam Details Patient Name: Date of Service: Gavin Dennis 06/23/2022 9:00 A M Medical Record Number: [id number] Account Number: [id number] of Birth/Sex: Treating RN: 902-Jun-1952(72y.o. M) Primary Care Provider: PEarly OsmondOther Clinician: [name], Dennis([id number] Page 2 of 7 Referring Provider: Treating Provider/Extender: HYoung Dennis Gavin Dennis in Treatment: 1 Constitutional respirations regular, non-labored and within target range for patient.. Cardiovascular 2+ dorsalis pedis/posterior tibialis pulses. Psychiatric pleasant and cooperative. Notes Right lower extremity: Epithelialization to most of the previous Scattered open wounds. An open wound remaining with granulation tissue. 2+ pitting edema to the knee. No signs of surrounding soft tissue infection including increased warmth, erythema or purulent drainage. Electronic Signature(s) Signed: 06/23/2022 12:04:24 PM By: HKalman ShanDO Entered By: HKalman Shanon 06/23/2022 10:00:26 -------------------------------------------------------------------------------- Physician Orders Details Patient Name: Date of Service: [name], COTEMES 06/23/2022 9:00 A M Medical Record Number: [id number] Account Number: [id number] of Birth/Sex: Treating RN: [birth year](72y.o. MHessie DienerPrimary Care Provider: PEarly OsmondOther Clinician: Referring Provider: Treating Provider/Extender: HYoung Dennis Gavin Dennis in Treatment: 1 Verbal / Phone Orders: No Diagnosis Coding ICD-10 Coding Code Description I87.311 Chronic venous hypertension (idiopathic) with ulcer of right lower extremity L97.812 Non-pressure chronic ulcer of other part of right lower leg with fat layer exposed I89.0 Lymphedema, not elsewhere classified E11.622 Type 2  diabetes mellitus with other skin ulcer Follow-up Appointments ppointment in 1 week. - Dr. [place] Other: - Purchase compression stockings from [employer], [facility], [facility], or [facility]. Information provided. Anesthetic (In clinic) Topical Lidocaine 4% applied to wound bed Bathing/ Shower/ Hygiene May shower with protection but do not get wound dressing(s) wet. Protect dressing(s) with water repellant cover (for example, large plastic bag) or a cast cover and may then take shower. Edema Control - Lymphedema / SCD / Other Elevate legs to the level of the heart or above for 30 minutes daily and/or when sitting for 3-4  times a day throughout the day. A void standing for long periods of time. Exercise regularly If compression wraps slide down please call wound center and speak with a nurse. Wound Treatment Wound #1 - Lower Leg Wound Laterality: Right, Anterior Cleanser: Soap and Water 1 x Per Week/30 Days Discharge Instructions: May shower and wash wound with dial antibacterial soap and water prior to dressing change. Cleanser: Vashe 5.8 (oz) 1 x Per Week/30 Days Discharge Instructions: Cleanse the wound with Vashe prior to applying a clean dressing using gauze sponges, not tissue or cotton balls. Peri-Wound Care: Sween Lotion (Moisturizing lotion) 1 x Per Week/30 Days Discharge Instructions: Apply moisturizing lotion as directed Gavin Dennis, Gavin Dennis ([id number]) [id number] Page 3 of 7 Topical: Gentamicin 1 x Per Week/30 Days Discharge Instructions: As directed by physician Topical: Mupirocin Ointment 1 x Per Week/30 Days Discharge Instructions: Apply Mupirocin (Bactroban) as instructed Prim Dressing: PolyMem Non-Adhesive Dressing, 4x4 in 1 x Per Week/30 Days ary Discharge Instructions: Apply to wound bed as instructed Secondary Dressing: Woven Gauze Sponge, Non-Sterile 4x4 in 1 x Per Week/30 Days Discharge Instructions: Apply over primary dressing as  directed. Compression Wrap: FourPress (4 layer compression wrap) 1 x Per Week/30 Days Discharge Instructions: Apply four layer compression as directed. May also use Urgo K2 compression system as alternative. Electronic Signature(s) Signed: 06/23/2022 12:04:24 PM By: Kalman Shan DO Entered By: Kalman Shan on 06/23/2022 10:01:23 -------------------------------------------------------------------------------- Problem List Details Patient Name: Date of Service: Gavin Dennis, Gavin Dennis MES 06/23/2022 9:00 A M Medical Record Number: [id number] Patient Account Number: [id number] Date of Birth/Sex: Treating RN: 21-May-1950 (72 y.o. Gavin Dennis Primary Care Provider: Early Dennis Other Clinician: Referring Provider: Treating Provider/Extender: Kendrick Fries in Treatment: 1 Active Problems ICD-10 Encounter Code Description Active Date MDM Diagnosis I87.311 Chronic venous hypertension (idiopathic) with ulcer of right lower extremity 06/15/2022 No Yes L97.812 Non-pressure chronic ulcer of other part of right lower leg with fat layer 06/15/2022 No Yes exposed I89.0 Lymphedema, not elsewhere classified 06/15/2022 No Yes E11.622 Type 2 diabetes mellitus with other skin ulcer 06/15/2022 No Yes Inactive Problems Resolved Problems Electronic Signature(s) Signed: 06/23/2022 12:04:24 PM By: Kalman Shan DO Entered By: Kalman Shan on 06/23/2022 09:53:41 Gavin Dennis ([id number]) [id number] Page 4 of 7 -------------------------------------------------------------------------------- Progress Note Details Patient Name: Date of Service: Gavin Dennis, Gavin Dennis MES 06/23/2022 9:00 A M Medical Record Number: [id number] Patient Account Number: [id number] Date of Birth/Sex: Treating RN: 1950-09-25 (72 y.o. M) Primary Care Provider: Early Dennis Other Clinician: Referring Provider: Treating Provider/Extender: Gavin Dennis, Gavin Dennis in Treatment:  1 Subjective Chief Complaint Information obtained from Patient 06/15/2022; right lower extremity wounds History of Present Illness (HPI) 06/15/2022 Mr. Elnora Schnelle is a 72 year old male with a past medical history of lymphedema, CVA, hypertension and controlled type 2 diabetes that presents to the clinic for a 65-monthhistory of waxing and waning of wounds to his right lower extremity that happened spontaneously. He has been keeping the wound areas covered. He does not wear compression stockings. He denies signs of infection. 2/23; patient presents for follow-up. We have been using antibiotic ointment with Hydrofera Blue under 3 layer compression. He tolerated the wrap well. There has been improvement in wound healing. Patient History Information obtained from Patient, Chart. Family History Unknown History. Social History Former smoker - ended on 05/02/2019, Marital Status - Single, Alcohol Use - Never, Drug Use - No History, Caffeine Use - Rarely. Medical History Cardiovascular Patient has history of Hypertension Endocrine Patient has history of Type II  Diabetes Hospitalization/Surgery History - stroke 2022,2023. Medical A Surgical History Notes nd Constitutional Symptoms (General Health) stroke 2022, 2023 Respiratory pulmonary nodules mediastinal lymphadenopathy Cardiovascular Hyperlipidemia thoracic aortic aneurysm Musculoskeletal polio (in childhood) Neurologic Degenerative Disc Disease, CVA Objective Constitutional respirations regular, non-labored and within target range for patient.. Vitals Time Taken: 9:20 AM, Height: 71 in, Weight: 219 lbs, BMI: 30.5, Temperature: 98.2 F, Pulse: 91 bpm, Respiratory Rate: 16 breaths/min, Blood Pressure: 163/121 mmHg. General Notes: per patient taking two new BP medications two days ago. Cardiovascular 2+ dorsalis pedis/posterior tibialis pulses. Psychiatric pleasant and cooperative. Gavin Dennis, Gavin Dennis ([id number])  [id number] Page 5 of 7 General Notes: Right lower extremity: Epithelialization to most of the previous Scattered open wounds. An open wound remaining with granulation tissue. 2+ pitting edema to the knee. No signs of surrounding soft tissue infection including increased warmth, erythema or purulent drainage. Integumentary (Hair, Skin) Wound #1 status is Open. Original cause of wound was Blister. The date acquired was: 04/11/2022. The wound has been in treatment 1 Dennis. The wound is located on the Right,Anterior Lower Leg. The wound measures 2.7cm length x 0.7cm width x 0.1cm depth; 1.484cm^2 area and 0.148cm^3 volume. There is Fat Layer (Subcutaneous Tissue) exposed. There is no tunneling or undermining noted. There is a medium amount of serosanguineous drainage noted. The wound margin is distinct with the outline attached to the wound base. There is large (67-100%) red, pink granulation within the wound bed. There is a small (1-33%) amount of necrotic tissue within the wound bed including Adherent Slough. The periwound skin appearance exhibited: Scarring, Hemosiderin Staining. The periwound skin appearance did not exhibit: Callus, Crepitus, Excoriation, Induration, Rash, Dry/Scaly, Maceration, Atrophie Blanche, Cyanosis, Ecchymosis, Mottled, Pallor, Rubor, Erythema. Assessment Active Problems ICD-10 Chronic venous hypertension (idiopathic) with ulcer of right lower extremity Non-pressure chronic ulcer of other part of right lower leg with fat layer exposed Lymphedema, not elsewhere classified Type 2 diabetes mellitus with other skin ulcer Patient has done well with Hydrofera Blue under compression therapy. We were using a 3 layer and his swelling is still not greatly controlled. I recommended going up to a 4-layer compression. I also recommended switching the dressing to PolyMem and continuing antibiotic ointment. Procedures Wound #1 Pre-procedure diagnosis of  Wound #1 is a Venous Leg Ulcer located on the Right,Anterior Lower Leg . There was a Four Layer Compression Therapy Procedure by Deon Pilling, RN. Post procedure Diagnosis Wound #1: Same as Pre-Procedure Plan Follow-up Appointments: Return Appointment in 1 week. - Dr. Heber [place] Other: - Purchase compression stockings from [employer], [facility], [facility], or [facility]. Information provided. Anesthetic: (In clinic) Topical Lidocaine 4% applied to wound bed Bathing/ Shower/ Hygiene: May shower with protection but do not get wound dressing(s) wet. Protect dressing(s) with water repellant cover (for example, large plastic bag) or a cast cover and may then take shower. Edema Control - Lymphedema / SCD / Other: Elevate legs to the level of the heart or above for 30 minutes daily and/or when sitting for 3-4 times a day throughout the day. Avoid standing for long periods of time. Exercise regularly If compression wraps slide down please call wound center and speak with a nurse. WOUND #1: - Lower Leg Wound Laterality: Right, Anterior Cleanser: Soap and Water 1 x Per Week/30 Days Discharge Instructions: May shower and wash wound with dial antibacterial soap and water prior to dressing change. Cleanser: Vashe 5.8 (oz) 1 x Per Week/30 Days Discharge Instructions: Cleanse the wound with Vashe prior to applying a clean dressing  using gauze sponges, not tissue or cotton balls. Peri-Wound Care: Sween Lotion (Moisturizing lotion) 1 x Per Week/30 Days Discharge Instructions: Apply moisturizing lotion as directed Topical: Gentamicin 1 x Per Week/30 Days Discharge Instructions: As directed by physician Topical: Mupirocin Ointment 1 x Per Week/30 Days Discharge Instructions: Apply Mupirocin (Bactroban) as instructed Prim Dressing: PolyMem Non-Adhesive Dressing, 4x4 in 1 x Per Week/30 Days ary Discharge Instructions: Apply to wound bed as instructed Secondary Dressing: Woven Gauze Sponge, Non-Sterile 4x4 in 1 x  Per Week/30 Days Discharge Instructions: Apply over primary dressing as directed. Com pression Wrap: FourPress (4 layer compression wrap) 1 x Per Week/30 Days Discharge Instructions: Apply four layer compression as directed. May also use Urgo K2 compression system as alternative. 1. PolyMem with antibiotic ointment under 4-layer compressionooright lower extremity 2. Follow-up in 1 week Gavin Dennis, Gavin Dennis ([id number]) [id number] Page 6 of 7 Electronic Signature(s) Signed: 06/23/2022 12:04:24 PM By: Kalman Shan DO Entered By: Kalman Shan on 06/23/2022 10:02:31 -------------------------------------------------------------------------------- HxROS Details Patient Name: Date of Service: Gavin Dennis, Gavin Dennis MES 06/23/2022 9:00 A M Medical Record Number: [id number] Patient Account Number: [id number] Date of Birth/Sex: Treating RN: 07-21-1950 (72 y.o. M) Primary Care Provider: Early Dennis Other Clinician: Referring Provider: Treating Provider/Extender: Gavin Dennis, Gavin Dennis in Treatment: 1 Information Obtained From Patient Chart Constitutional Symptoms (General Health) Medical History: Past Medical History Notes: stroke 2022, 2023 Respiratory Medical History: Past Medical History Notes: pulmonary nodules mediastinal lymphadenopathy Cardiovascular Medical History: Positive for: Hypertension Past Medical History Notes: Hyperlipidemia thoracic aortic aneurysm Endocrine Medical History: Positive for: Type II Diabetes Time with diabetes: 1 year Treated with: Oral agents Blood sugar tested every day: No Musculoskeletal Medical History: Past Medical History Notes: polio (in childhood) Neurologic Medical History: Past Medical History Notes: Degenerative Disc Disease, CVA Immunizations Pneumococcal Vaccine: Received Pneumococcal Vaccination: Yes Received Pneumococcal Vaccination On or After 60th Birthday: Yes Implantable  Devices None Hospitalization / Surgery History Type of Hospitalization/Surgery stroke 2022,2023 KARANDEEP, HASKILL ([id number]) [id number] Page 7 of 7 Family and Social History Unknown History: Yes; Former smoker - ended on 05/02/2019; Marital Status - Single; Alcohol Use: Never; Drug Use: No History; Caffeine Use: Rarely; Financial Concerns: No; Food, Clothing or Shelter Needs: No; Support System Lacking: No; Transportation Concerns: No Electronic Signature(s) Signed: 06/23/2022 12:04:24 PM By: Kalman Shan DO Entered By: Kalman Shan on 06/23/2022 09:59:30 -------------------------------------------------------------------------------- SuperBill Details Patient Name: Date of Service: ALIAS, GRAHN MES 06/23/2022 Medical Record Number: [id number] Patient Account Number: [id number] Date of Birth/Sex: Treating RN: 1951/04/27 (72 y.o. Gavin Dennis Primary Care Provider: Early Dennis Other Clinician: Referring Provider: Treating Provider/Extender: Gavin Dennis, Gavin Dennis in Treatment: 1 Diagnosis Coding ICD-10 Codes Code Description I87.311 Chronic venous hypertension (idiopathic) with ulcer of right lower extremity L97.812 Non-pressure chronic ulcer of other part of right lower leg with fat layer exposed I89.0 Lymphedema, not elsewhere classified E11.622 Type 2 diabetes mellitus with other skin ulcer Facility Procedures : CPT4 Code: [id number] Description: (Facility Use Only) [phone] - [place] LEG Modifier: Quantity: 1 Physician Procedures : CPT4 Code Description Modifier [id number] - WC PHYS LEVEL 3 - EST PT ICD-10 Diagnosis Description I87.311 Chronic venous hypertension (idiopathic) with ulcer of right lower extremity L97.812 Non-pressure chronic ulcer of other part of right lower  leg with fat layer exposed I89.0 Lymphedema, not elsewhere classified E11.622 Type 2 diabetes mellitus with other skin ulcer Quantity:  1 Electronic Signature(s) Signed: 06/23/2022 12:04:24 PM By: Kalman Shan DO Entered By: Kalman Shan on 06/23/2022 10:02:44

## 2022-06-24 NOTE — Progress Notes (Signed)
Gavin Dennis, Gavin Dennis ([id number]) [id number] Page 1 of 8 Visit Report for 06/23/2022 Arrival Information Details Patient Name: Date of Service: Gavin Dennis, Gavin Dennis MES 06/23/2022 9:00 A M Medical Record Number: [id number] Patient Account Number: [id number] Date of Birth/Sex: Treating RN: 19-Jun-1950 (72 y.o. [name] Primary Care Elnore Cosens: Early Osmond Other Clinician: Referring Aimy Sweeting: Treating Ytzel Gubler/Extender: Young Berry, Rinka Weeks in Treatment: 1 Visit Information History Since Last Visit All ordered tests and consults were completed: Yes Patient Arrived: Cane Added or deleted any medications: No Arrival Time: 09:22 Any new allergies or adverse reactions: No Accompanied By: self Had a fall or experienced change in No Transfer Assistance: None activities of daily living that may affect Patient Identification Verified: Yes risk of falls: Secondary Verification Process Completed: Yes Signs or symptoms of abuse/neglect since last visito No Patient Requires Transmission-Based Precautions: No Hospitalized since last visit: No Patient Has Alerts: No Implantable device outside of the clinic excluding No cellular tissue based products placed in the center since last visit: Has Dressing in Place as Prescribed: Yes Has Compression in Place as Prescribed: Yes Pain Present Now: No Notes per patient dx Ca last week plan is to start chemo and radiation. Electronic Signature(s) Signed: 06/23/2022 12:41:16 PM By: Deon Pilling RN, BSN Entered By: Deon Pilling on 06/23/2022 09:23:10 -------------------------------------------------------------------------------- Compression Therapy Details Patient Name: Date of Service: YUNIS, ELG MES 06/23/2022 9:00 A M Medical Record Number: [id number] Patient Account Number: [id number] Date of Birth/Sex: Treating RN: [birth year] (72 y.o. [name] Primary Care Shaw Dobek: Early Osmond Other  Clinician: Referring Dalayna Lauter: Treating Mekaylah Klich/Extender: Young Berry, Rinka Weeks in Treatment: 1 Compression Therapy Performed for Wound Assessment: Wound #1 Right,Anterior Lower Leg Performed By: Clinician Deon Pilling, RN Compression Type: Four Layer Post Procedure Diagnosis Same as Pre-procedure Electronic Signature(s) Signed: 06/23/2022 12:41:16 PM By: Deon Pilling RN, BSN Entered By: Deon Pilling on 06/23/2022 09:48:48 [name] ([id number] Page 2 of 8 -------------------------------------------------------------------------------- Encounter Discharge Information Details Patient Name: Date of Service: Gavin Dennis, Gavin Dennis MES 06/23/2022 9:00 A M Medical Record Number: [id number] Patient Account Number: [id number] Date of Birth/Sex: Treating RN: 1951/03/20 (72 y.o. [name] Primary Care Apphia Cropley: Early Osmond Other Clinician: Referring Grae Cannata: Treating Avraham Benish/Extender: Kendrick Fries in Treatment: 1 Encounter Discharge Information Items Discharge Condition: Stable Ambulatory Status: Cane Discharge Destination: Home Transportation: Private Auto Accompanied By: self Schedule Follow-up Appointment: Yes Clinical Summary of Care: Electronic Signature(s) Signed: 06/23/2022 12:41:16 PM By: Deon Pilling RN, BSN Entered By: Deon Pilling on 06/23/2022 09:49:24 -------------------------------------------------------------------------------- Lower Extremity Assessment Details Patient Name: Date of Service: Gavin Dennis, Gavin Dennis MES 06/23/2022 9:00 A M Medical Record Number: [id number] Patient Account Number: [id number] Date of Birth/Sex: Treating RN: Aug 02, 1950 (72 y.o. [name] Primary Care Biannca Scantlin: Early Osmond Other Clinician: Referring Deshay Kirstein: Treating Armanie Ullmer/Extender: Young Berry, Rinka Weeks in Treatment: 1 Edema Assessment Assessed: [Left: No] [Right: Yes] Edema: [Left:  Ye] [Right: s] Calf Left: Right: Point of Measurement: 42 cm From Medial Instep 44 cm Ankle Left: Right: Point of Measurement: 14 cm From Medial Instep 24 cm Knee To Floor Left: Right: From Medial Instep 52 cm Vascular Assessment Pulses: Dorsalis Pedis Palpable: [Right:Yes] Electronic Signature(s) Signed: 06/23/2022 12:41:16 PM By: Deon Pilling RN, BSN Entered By: Deon Pilling on 06/23/2022 09:46:23 [name] ([id number] Page 3 of 8 -------------------------------------------------------------------------------- Multi Wound Chart Details Patient Name: Date of Service: Gavin Dennis, Gavin Dennis MES 06/23/2022 9:00 A M Medical Record Number: [id number] Patient Account Number: [id number] Date of Birth/Sex: Treating RN: Sep 24, 1950 (71  y.o. M) Primary Care Deicy Rusk: Early Osmond Other Clinician: Referring Sashia Campas: Treating Joelyn Lover/Extender: Young Berry, Rinka Weeks in Treatment: 1 Vital Signs Height(in): 71 Pulse(bpm): 91 Weight(lbs): 219 Blood Pressure(mmHg): 163/121 Body Mass Index(BMI): 30.5 Temperature(F): 98.2 Respiratory Rate(breaths/min): 16 [1:Photos:] [N/A:N/A] Right, Anterior Lower Leg N/A N/A Wound Location: Blister N/A N/A Wounding Event: Venous Leg Ulcer N/A N/A Primary Etiology: Diabetic Wound/Ulcer of the Lower N/A N/A Secondary Etiology: Extremity Hypertension, Type II Diabetes N/A N/A Comorbid History: 04/11/2022 N/A N/A Date Acquired: 1 N/A N/A Weeks of Treatment: Open N/A N/A Wound Status: No N/A N/A Wound Recurrence: Yes N/A N/A Clustered Wound: 1 N/A N/A Clustered Quantity: 2.7x0.7x0.1 N/A N/A Measurements L x W x D (cm) 1.484 N/A N/A A (cm) : rea 0.148 N/A N/A Volume (cm) : 98.40% N/A N/A % Reduction in Area: 98.40% N/A N/A % Reduction in Volume: Full Thickness Without Exposed N/A N/A Classification: Support Structures Medium N/A N/A Exudate Amount: Serosanguineous N/A  N/A Exudate Type: red, brown N/A N/A Exudate Color: Distinct, outline attached N/A N/A Wound Margin: Large (67-100%) N/A N/A Granulation Amount: Red, Pink N/A N/A Granulation Quality: Small (1-33%) N/A N/A Necrotic Amount: Fat Layer (Subcutaneous Tissue): Yes N/A N/A Exposed Structures: Fascia: No Tendon: No Muscle: No Joint: No Bone: No Large (67-100%) N/A N/A Epithelialization: Scarring: Yes N/A N/A Periwound Skin Texture: Excoriation: No Induration: No Callus: No Crepitus: No Rash: No Maceration: No N/A N/A Periwound Skin Moisture: Dry/Scaly: No Hemosiderin Staining: Yes N/A N/A Periwound Skin Color: Atrophie Blanche: No Cyanosis: No Ecchymosis: No Erythema: No Mottled: No Pallor: No Gavin Dennis, Gavin Dennis ([id number]) [id number] Page 4 of 8 Rubor: No Compression Therapy N/A N/A Procedures Performed: Treatment Notes Wound #1 (Lower Leg) Wound Laterality: Right, Anterior Cleanser Soap and Water Discharge Instruction: May shower and wash wound with dial antibacterial soap and water prior to dressing change. Vashe 5.8 (oz) Discharge Instruction: Cleanse the wound with Vashe prior to applying a clean dressing using gauze sponges, not tissue or cotton balls. Peri-Wound Care Sween Lotion (Moisturizing lotion) Discharge Instruction: Apply moisturizing lotion as directed Topical Gentamicin Discharge Instruction: As directed by physician Mupirocin Ointment Discharge Instruction: Apply Mupirocin (Bactroban) as instructed Primary Dressing PolyMem Non-Adhesive Dressing, 4x4 in Discharge Instruction: Apply to wound bed as instructed Secondary Dressing Woven Gauze Sponge, Non-Sterile 4x4 in Discharge Instruction: Apply over primary dressing as directed. Secured With Compression Wrap FourPress (4 layer compression wrap) Discharge Instruction: Apply four layer compression as directed. May also use Urgo K2 compression system as  alternative. Compression Stockings Add-Ons Electronic Signature(s) Signed: 06/23/2022 12:04:24 PM By: Kalman Shan DO Entered By: Kalman Shan on 06/23/2022 09:53:58 -------------------------------------------------------------------------------- Multi-Disciplinary Care Plan Details Patient Name: Date of Service: TARVARIS, SLY MES 06/23/2022 9:00 A M Medical Record Number: [id number] Patient Account Number: [id number] Date of Birth/Sex: Treating RN: Jan 31, 1951 (72 y.o. [name] Primary Care Mubashir Mallek: Early Osmond Other Clinician: Referring Rehanna Oloughlin: Treating Lynn Sissel/Extender: Young Berry, Rinka Weeks in Treatment: 1 Active Inactive Venous Leg Ulcer Nursing Diagnoses: Knowledge deficit related to disease process and management Potential for venous Insuffiency (use before diagnosis confirmed) GoalsALLEN, Gavin Dennis ([id number]) [id number] Page 5 of 8 Patient will maintain optimal edema control Date Initiated: 06/15/2022 Target Resolution Date: 07/07/2022 Goal Status: Active Interventions: Assess peripheral edema status every visit. Compression as ordered Provide education on venous insufficiency Notes: Wound/Skin Impairment Nursing Diagnoses: Knowledge deficit related to ulceration/compromised skin integrity Goals: Patient/caregiver will verbalize understanding of skin care regimen Date Initiated: 06/15/2022 Target Resolution Date: 07/06/2022 Goal Status: Active Interventions:  Assess patient/caregiver ability to perform ulcer/skin care regimen upon admission and as needed Assess ulceration(s) every visit Provide education on ulcer and skin care Treatment Activities: Skin care regimen initiated : 06/15/2022 Topical wound management initiated : 06/15/2022 Notes: Electronic Signature(s) Signed: 06/23/2022 12:41:16 PM By: Deon Pilling RN, BSN Entered By: Deon Pilling on 06/23/2022  09:27:25 -------------------------------------------------------------------------------- Pain Assessment Details Patient Name: Date of Service: Gavin Dennis, Gavin Dennis MES 06/23/2022 9:00 [place] Record Number: [id number] Patient Account Number: [id number] Date of Birth/Sex: Treating RN: 1951/04/27 (72 y.o. [name] Primary Care Henretter Piekarski: Early Osmond Other Clinician: Referring Aslan Himes: Treating Rhina Kramme/Extender: Young Berry, Rinka Weeks in Treatment: 1 Active Problems Location of Pain Severity and Description of Pain Patient Has Paino No Site Locations Rate the pain. Current Pain Level: 0 Gavin Dennis, Gavin Dennis ([id number]) [id number] Page 6 of 8 Pain Management and Medication Current Pain Management: Medication: No Cold Application: No Rest: No Massage: No Activity: No T.E.N.S.: No Heat Application: No Leg drop or elevation: No Is the Current Pain Management Adequate: Adequate How does your wound impact your activities of daily livingo Sleep: No Bathing: No Appetite: No Relationship With Others: No Bladder Continence: No Emotions: No Bowel Continence: No Work: No Toileting: No Drive: No Dressing: No Hobbies: No Engineer, maintenance) Signed: 06/23/2022 12:41:16 PM By: Deon Pilling RN, BSN Entered By: Deon Pilling on 06/23/2022 09:25:52 -------------------------------------------------------------------------------- Patient/Caregiver Education Details Patient Name: Date of Service: Gavin Dennis MES 2/23/2024andnbsp9:00 [place] Record Number: [id number] Patient Account Number: [id number] Date of Birth/Gender: Treating RN: January 05, 1951 (72 y.o. [name] Primary Care Physician: Early Osmond Other Clinician: Referring Physician: Treating Physician/Extender: Kendrick Fries in Treatment: 1 Education Assessment Education Provided To: Patient Education Topics Provided Wound/Skin  Impairment: Handouts: Caring for Your Ulcer Methods: Explain/Verbal Responses: Reinforcements needed Electronic Signature(s) Signed: 06/23/2022 12:41:16 PM By: Deon Pilling RN, BSN Entered By: Deon Pilling on 06/23/2022 09:27:37 -------------------------------------------------------------------------------- Wound Assessment Details Patient Name: Date of Service: Gavin Dennis, Gavin Dennis MES 06/23/2022 9:00 [place] Record Number: [id number] Patient Account Number: [id number] Date of Birth/Sex: Treating RN: Aug 08, 1950 (72 y.o. [name] Primary Care Adis Sturgill: Early Osmond Other Clinician: Referring Mikaelyn Arthurs: Treating Mayci Haning/Extender: Young Berry, Rinka Weeks in Treatment: 1 Wound Status [name] ([id number]) [id number] Page 7 of 8 Wound Number: 1 Primary Etiology: Venous Leg Ulcer Wound Location: Right, Anterior Lower Leg Secondary Etiology: Diabetic Wound/Ulcer of the Lower Extremity Wounding Event: Blister Wound Status: Open Date Acquired: 04/11/2022 Comorbid History: Hypertension, Type II Diabetes Weeks Of Treatment: 1 Clustered Wound: Yes Photos Wound Measurements Length: (cm) Width: (cm) Depth: (cm) Clustered Quantity: Area: (cm) Volume: (cm) 2.7 % Reduction in Area: 98.4% 0.7 % Reduction in Volume: 98.4% 0.1 Epithelialization: Large (67-100%) 1 Tunneling: No 1.484 Undermining: No 0.148 Wound Description Classification: Full Thickness Without Exposed Support Structures Wound Margin: Distinct, outline attached Exudate Amount: Medium Exudate Type: Serosanguineous Exudate Color: red, brown Foul Odor After Cleansing: No Slough/Fibrino Yes Wound Bed Granulation Amount: Large (67-100%) Exposed Structure Granulation Quality: Red, Pink Fascia Exposed: No Necrotic Amount: Small (1-33%) Fat Layer (Subcutaneous Tissue) Exposed: Yes Necrotic Quality: Adherent Slough Tendon Exposed: No Muscle Exposed: No Joint Exposed:  No Bone Exposed: No Periwound Skin Texture Texture Color No Abnormalities Noted: No No Abnormalities Noted: No Callus: No Atrophie Blanche: No Crepitus: No Cyanosis: No Excoriation: No Ecchymosis: No Induration: No Erythema: No Rash: No Hemosiderin Staining: Yes Scarring: Yes Mottled: No Pallor: No Moisture Rubor: No No Abnormalities Noted: No Dry / Scaly: No Maceration: No  Treatment Notes Wound #1 (Lower Leg) Wound Laterality: Right, Anterior Cleanser Soap and Water Discharge Instruction: May shower and wash wound with dial antibacterial soap and water prior to dressing change. Vashe 5.8 (oz) Discharge Instruction: Cleanse the wound with Vashe prior to applying a clean dressing using gauze sponges, not tissue or cotton balls. Peri-Wound Care Sween Lotion (Moisturizing lotion) Discharge Instruction: Apply moisturizing lotion as directed Gavin Dennis, Gavin Dennis ([id number]) [id number] Page 8 of 8 Topical Gentamicin Discharge Instruction: As directed by physician Mupirocin Ointment Discharge Instruction: Apply Mupirocin (Bactroban) as instructed Primary Dressing PolyMem Non-Adhesive Dressing, 4x4 in Discharge Instruction: Apply to wound bed as instructed Secondary Dressing Woven Gauze Sponge, Non-Sterile 4x4 in Discharge Instruction: Apply over primary dressing as directed. Secured With Compression Wrap FourPress (4 layer compression wrap) Discharge Instruction: Apply four layer compression as directed. May also use Urgo K2 compression system as alternative. Compression Stockings Add-Ons Electronic Signature(s) Signed: 06/23/2022 12:41:16 PM By: Deon Pilling RN, BSN Entered By: Deon Pilling on 06/23/2022 09:29:40 -------------------------------------------------------------------------------- Vitals Details Patient Name: Date of Service: Gavin Dennis, Gavin Dennis MES 06/23/2022 9:00 A M Medical Record Number: [id number] Patient Account Number: [id number] Date  of Birth/Sex: Treating RN: 11/01/1950 (72 y.o. [name] Primary Care Lonya Johannesen: Early Osmond Other Clinician: Referring Mizraim Harmening: Treating Cyndee Giammarco/Extender: Young Berry, Rinka Weeks in Treatment: 1 Vital Signs Time Taken: 09:20 Temperature (F): 98.2 Height (in): 71 Pulse (bpm): 91 Weight (lbs): 219 Respiratory Rate (breaths/min): 16 Body Mass Index (BMI): 30.5 Blood Pressure (mmHg): 163/121 Reference Range: 80 - 120 mg / dl Notes per patient taking two new BP medications two days ago. Electronic Signature(s) Signed: 06/23/2022 12:41:16 PM By: Deon Pilling RN, BSN Entered By: Deon Pilling on 06/23/2022 09:30:29

## 2022-06-26 ENCOUNTER — Other Ambulatory Visit: Payer: Self-pay

## 2022-06-26 ENCOUNTER — Inpatient Hospital Stay: Payer: Medicare HMO

## 2022-06-26 ENCOUNTER — Encounter: Payer: Self-pay | Admitting: Internal Medicine

## 2022-06-26 ENCOUNTER — Ambulatory Visit: Payer: Medicare HMO

## 2022-06-26 ENCOUNTER — Inpatient Hospital Stay: Payer: Medicare HMO | Attending: Internal Medicine | Admitting: Internal Medicine

## 2022-06-26 VITALS — BP 150/105 | HR 86 | Temp 99.5°F | Resp 17 | Wt 209.1 lb

## 2022-06-26 DIAGNOSIS — C3490 Malignant neoplasm of unspecified part of unspecified bronchus or lung: Secondary | ICD-10-CM | POA: Insufficient documentation

## 2022-06-26 DIAGNOSIS — Z87891 Personal history of nicotine dependence: Secondary | ICD-10-CM

## 2022-06-26 DIAGNOSIS — I6300 Cerebral infarction due to thrombosis of unspecified precerebral artery: Secondary | ICD-10-CM

## 2022-06-26 DIAGNOSIS — Z8041 Family history of malignant neoplasm of ovary: Secondary | ICD-10-CM | POA: Diagnosis not present

## 2022-06-26 DIAGNOSIS — R918 Other nonspecific abnormal finding of lung field: Secondary | ICD-10-CM

## 2022-06-26 DIAGNOSIS — Z801 Family history of malignant neoplasm of trachea, bronchus and lung: Secondary | ICD-10-CM

## 2022-06-26 LAB — CBC WITH DIFFERENTIAL (CANCER CENTER ONLY)
Abs Immature Granulocytes: 0.02 10*3/uL (ref 0.00–0.07)
Basophils Absolute: 0 10*3/uL (ref 0.0–0.1)
Basophils Relative: 0 %
Eosinophils Absolute: 0.2 10*3/uL (ref 0.0–0.5)
Eosinophils Relative: 4 %
HCT: 41.1 % (ref 39.0–52.0)
Hemoglobin: 14.1 g/dL (ref 13.0–17.0)
Immature Granulocytes: 0 %
Lymphocytes Relative: 29 %
Lymphs Abs: 1.4 10*3/uL (ref 0.7–4.0)
MCH: 30.9 pg (ref 26.0–34.0)
MCHC: 34.3 g/dL (ref 30.0–36.0)
MCV: 89.9 fL (ref 80.0–100.0)
Monocytes Absolute: 1 10*3/uL (ref 0.1–1.0)
Monocytes Relative: 21 %
Neutro Abs: 2.2 10*3/uL (ref 1.7–7.7)
Neutrophils Relative %: 46 %
Platelet Count: 173 10*3/uL (ref 150–400)
RBC: 4.57 MIL/uL (ref 4.22–5.81)
RDW: 15.3 % (ref 11.5–15.5)
WBC Count: 4.8 10*3/uL (ref 4.0–10.5)
nRBC: 0 % (ref 0.0–0.2)

## 2022-06-26 LAB — CMP (CANCER CENTER ONLY)
ALT: 18 U/L (ref 0–44)
AST: 13 U/L — ABNORMAL LOW (ref 15–41)
Albumin: 3.6 g/dL (ref 3.5–5.0)
Alkaline Phosphatase: 103 U/L (ref 38–126)
Anion gap: 6 (ref 5–15)
BUN: 16 mg/dL (ref 8–23)
CO2: 26 mmol/L (ref 22–32)
Calcium: 9.5 mg/dL (ref 8.9–10.3)
Chloride: 106 mmol/L (ref 98–111)
Creatinine: 0.93 mg/dL (ref 0.61–1.24)
GFR, Estimated: >60 mL/min (ref >60)
Glucose, Bld: 120 mg/dL — ABNORMAL HIGH (ref 70–99)
Potassium: 3.9 mmol/L (ref 3.5–5.1)
Sodium: 138 mmol/L (ref 135–145)
Total Bilirubin: 0.8 mg/dL (ref 0.3–1.2)
Total Protein: 7.1 g/dL (ref 6.5–8.1)

## 2022-06-26 MED ORDER — PROCHLORPERAZINE MALEATE 10 MG PO TABS: 10.0000 mg | ORAL_TABLET | Freq: Four times a day (QID) | ORAL | 0 refills | Status: DC | PRN

## 2022-06-26 NOTE — Progress Notes (Signed)
START ON PATHWAY REGIMEN - Non-Small Cell Lung     A cycle is every 7 days, concurrent with RT:     Paclitaxel      Carboplatin   **Always confirm dose/schedule in your pharmacy ordering system**  Patient Characteristics: Preoperative or Nonsurgical Candidate (Clinical Staging), Stage III - Nonsurgical Candidate (Nonsquamous and Squamous), PS = 0, 1 Therapeutic Status: Preoperative or Nonsurgical Candidate (Clinical Staging) AJCC T Category: cT0 AJCC N Category: cN2 AJCC M Category: cM0 AJCC 8 Stage Grouping: Unknown ECOG Performance Status: 1 Intent of Therapy: Curative Intent, Discussed with Patient

## 2022-06-26 NOTE — Progress Notes (Signed)
[facility] [phone]   [phone]  CONSULT NOTE  REFERRING PHYSICIAN: Dr. Marshell Garfinkel  REASON FOR CONSULTATION:  72 years old black male diagnosed with lung cancer.  HPI Gavin Dennis is a 72 y.o. male with past medical history significant for stroke, hypertension, dyslipidemia, vitamin B12 deficiency, left eye blurry vision, polio as a child as well as diabetes mellitus type 2.  The patient also has a long history of smoking but quit 3 years ago.  He mentioned that he presented to the emergency department with strokelike symptoms and weakness of the left hand as well as left eye droop.  During his evaluation for the stroke he had MRI of the brain without contrast on March 13, 2022 that showed a suspicious acute infarct in the right centrum semiovale extending to the parietal lobe cortex without hemorrhage or mass effect.  The patient also had CT angiogram of the chest on the same day and that showed nonspecific new mediastinal and right hilar lymphadenopathy since March 2023 and lymphoproliferative disorder or metastatic disease could not be excluded.  He was referred to Dr. Vaughan Browner and a PET scan was performed on April 21, 2022 and that showed hypermetabolic mediastinal and right hilar/infrahilar nodes including subcarinal lymph node measuring 1.9 cm with SUV max of 8.4.  There was also right infrahilar adenopathy measuring 1.8 cm with SUV max of 8.1.  On June 12, 2022 the patient underwent video bronchoscopy with EBUS.  The final cytology ([id number]) of the fine-needle aspiration of station 7 lymph node malignant cells consistent with adenocarcinoma.  The patient was referred to me today for evaluation and recommendation regarding treatment of his condition. When seen today he has a slight headache as well as pain in the left knee.  He also has cough with no shortness of breath, chest pain or hemoptysis.  He lost around 10 pounds in the last  few weeks.  He has no nausea, vomiting, diarrhea or constipation. Family history significant for mother with heart disease.  Father had prostate cancer and brother had lung cancer. The patient is single and has 3 children 1 girl and 2 sons.  He works in Designer, fashion/clothing of the truck bed.  He has a history of smoking more than 1 pack/day for around 55 years and quit 3 years ago.  He also has a history of alcohol and drug abuse in the past but not recently.  HPI  Past Medical History:  Diagnosis Date   Blurred vision, left eye    since stroke 06/2020   CVA (cerebral vascular accident) ([facility]) 06/2020   DDD (degenerative disc disease), lumbar    Diabetes mellitus without complication ([facility])    Hyperlipidemia    Hypertension    Hypomagnesemia    Polio    childhood   Vitamin B 12 deficiency     Past Surgical History:  Procedure Laterality Date   BRONCHIAL NEEDLE ASPIRATION BIOPSY  06/12/2022   Procedure: BRONCHIAL NEEDLE ASPIRATION BIOPSIES;  Surgeon: Marshell Garfinkel, MD;  Location: [facility];  Service: Cardiopulmonary;;   COLONOSCOPY     ENDOBRONCHIAL ULTRASOUND Bilateral 06/12/2022   Procedure: ENDOBRONCHIAL ULTRASOUND;  Surgeon: Marshell Garfinkel, MD;  Location: [facility];  Service: Cardiopulmonary;  Laterality: Bilateral;   HEMOSTASIS CONTROL  06/12/2022   Procedure: HEMOSTASIS CONTROL;  Surgeon: Marshell Garfinkel, MD;  Location: [facility];  Service: Cardiopulmonary;;   LOOP RECORDER INSERTION N/A 03/15/2022   Procedure: LOOP RECORDER INSERTION;  Surgeon: Melida Quitter, MD;  Location: [facility];  Service: Cardiovascular;  Laterality: N/A;   VIDEO BRONCHOSCOPY N/A 06/12/2022   Procedure: VIDEO BRONCHOSCOPY WITHOUT FLUORO;  Surgeon: Marshell Garfinkel, MD;  Location: [facility];  Service: Cardiopulmonary;  Laterality: N/A;    Family History  Problem Relation Age of Onset   Heart Problems Mother    Prostate cancer Father    Lung cancer Brother     Social History Social  History   Tobacco Use   Smoking status: Former    Packs/day: 1.00    Years: 30.00    Total pack years: 30.00    Types: Cigarettes    Quit date: 05/02/2019    Years since quitting: 3.1   Smokeless tobacco: Never   Tobacco comments:    quit a mo ago  Substance Use Topics   Alcohol use: Not Currently    Comment: quit a mo ago   Drug use: Not Currently    Types: Cocaine    Comment: hx of abuse    No Known Allergies  Current Outpatient Medications  Medication Sig Dispense Refill   amLODipine (NORVASC) 10 MG tablet Take 1 tablet (10 mg total) by mouth daily. 90 tablet 0   aspirin EC 81 MG tablet Take 81 mg by mouth in the morning. Swallow whole.     atorvastatin (LIPITOR) 20 MG tablet Take 20 mg by mouth in the morning.     atorvastatin (LIPITOR) 40 MG tablet Take 1 tablet (40 mg total) by mouth daily. (Patient not taking: Reported on 05/16/2022) 90 tablet 3   BREZTRI AEROSPHERE 160-9-4.8 MCG/ACT AERO Inhale 2 puffs into the lungs daily.     carvedilol (COREG) 6.25 MG tablet Take 1 tablet (6.25 mg total) by mouth 2 (two) times daily. 60 tablet 5   clopidogrel (PLAVIX) 75 MG tablet Take 1 tablet (75 mg total) by mouth daily. 30 tablet 3   Cyanocobalamin (VITAMIN B-12 PO) Take 1 tablet by mouth daily.     metFORMIN (GLUCOPHAGE) 500 MG tablet Take 500 mg by mouth in the morning.     Omega-3 Fatty Acids (OMEGA-3 PO) Take 1 capsule by mouth in the morning. OmegaXL Joint Pain Relief & Inflammation Supplement     omeprazole (PRILOSEC) 40 MG capsule Take 40 mg by mouth daily as needed (indigestion.).     Study - OCEANIC-STROKE - asundexian 50 mg or placebo tablet (PI-Sethi) Take 1 tablet (50 mg total) by mouth daily. For Investigational Use Only. Take at the same time each day (preferably in the morning). Tablet should be swallowed whole with water; it CANNOT be crushed or broken. 98 tablet 0   No current facility-administered medications for this visit.    Review of  Systems  Constitutional: positive for fatigue and weight loss Eyes: negative Ears, nose, mouth, throat, and face: negative Respiratory: positive for cough Cardiovascular: negative Gastrointestinal: negative Genitourinary:negative Integument/breast: negative Hematologic/lymphatic: negative Musculoskeletal:negative Neurological: positive for headaches Behavioral/Psych: negative Endocrine: negative Allergic/Immunologic: negative  Physical Exam  [id number], healthy, no distress, well nourished, and well developed SKIN: skin color, texture, turgor are normal, no rashes or significant lesions HEAD: Normocephalic, No masses, lesions, tenderness or abnormalities EYES: normal, PERRLA, Conjunctiva are pink and non-injected EARS: External ears normal, Canals clear OROPHARYNX:no exudate, no erythema, and lips, buccal mucosa, and tongue normal  NECK: supple, no adenopathy, no JVD LYMPH:  no palpable lymphadenopathy, no hepatosplenomegaly LUNGS: clear to auscultation , and palpation HEART: regular rate & rhythm, no murmurs, and no gallops ABDOMEN:abdomen soft, non-tender, normal  bowel sounds, and no masses or organomegaly BACK: Back symmetric, no curvature., No CVA tenderness EXTREMITIES:no joint deformities, effusion, or inflammation, no edema  NEURO: alert & oriented x 3 with fluent speech, no focal motor/sensory deficits  PERFORMANCE STATUS: ECOG 1  LABORATORY DATA: Lab Results  Component Value Date   WBC 4.8 06/26/2022   HGB 14.1 06/26/2022   HCT 41.1 06/26/2022   MCV 89.9 06/26/2022   PLT 173 06/26/2022      Chemistry      Component Value Date/Time   NA 138 06/26/2022 1055   NA 140 03/06/2022 1715   K 3.9 06/26/2022 1055   CL 106 06/26/2022 1055   CO2 26 06/26/2022 1055   BUN 16 06/26/2022 1055   BUN 29 (H) 03/06/2022 1715   CREATININE 0.93 06/26/2022 1055      Component Value Date/Time   CALCIUM 9.5 06/26/2022 1055   ALKPHOS 103 06/26/2022 1055   AST 13 (L)  06/26/2022 1055   ALT 18 06/26/2022 1055   BILITOT 0.8 06/26/2022 1055       RADIOGRAPHIC STUDIES: DG Knee 3 Views Left  Result Date: 06/20/2022 CLINICAL DATA:  Anterior knee pain.  No known injury. EXAM: LEFT KNEE - 3 VIEW COMPARISON:  None Available. FINDINGS: Mild tricompartmental joint space narrowing and peripheral osteophytosis, greatest within the lateral compartment. Moderate superior patellar degenerative osteophyte. No acute fracture or dislocation. Small joint effusion. Moderate vascular calcifications. IMPRESSION: Mild tricompartmental osteoarthritis, greatest within the lateral compartment. Electronically Signed   By: Yvonne Kendall M.D.   On: 06/20/2022 14:46   DG CHEST PORT 1 VIEW  Result Date: 06/12/2022 CLINICAL DATA:  Status post bronchoscopy. EXAM: PORTABLE CHEST 1 VIEW COMPARISON:  PET 04/21/2022, 03/13/2022 and CT chest 03/13/2022. FINDINGS: Trachea is midline. Heart size is accentuated by technique. Loop recorder projects over the left chest. Mild bibasilar atelectasis. No airspace consolidation or definite pleural fluid. No pneumothorax. IMPRESSION: 1. No pneumothorax. 2. Mild bibasilar atelectasis. 3. Mediastinal and hilar adenopathy better seen on cross-sectional imaging. Electronically Signed   By: Lorin Picket M.D.   On: 06/12/2022 12:47    ASSESSMENT: This is a very pleasant 72 years old African-American male recently diagnosed with stage IIIb (T0 N2, M0) non-small cell lung cancer, adenocarcinoma presented with right hilar and mediastinal lymphadenopathy with no clear primary diagnosed in February 2024. The patient had molecular studies by [facility] that showed positive KRAS G12C mutation and PD-L1 expression of 53%.  PLAN: I had a lengthy discussion with the patient today about his current disease stage, prognosis and treatment options. I personally and independently reviewed the scan images and discussed the result and showed the images to the patient today. I  recommended for the patient to have repeat CT scan of the chest for reevaluation of his disease before starting treatment since his last imaging studies was more than 2 months ago. I discussed with the patient his treatment options and I recommended for him a course of concurrent chemoradiation with weekly carboplatin for AUC of 2 and paclitaxel 45 Mg/M2 for 6-7 weeks followed by consolidation immunotherapy if the patient has no disease progression. I discussed with the patient the adverse effect of this treatment including but not limited to alopecia, myelosuppression, nausea and vomiting, peripheral neuropathy, liver or renal dysfunction. I will refer him to radiation oncology for discussion of the radiotherapy option. The patient will have a chemotherapy education class before the first dose of his treatment. He is expected to start the first dose  of this treatment on July 10, 2022. He will come back for follow-up visit at that time. I will call his pharmacy with prescription for Compazine 10 mg p.o. every 6 hours as needed for nausea. The patient was advised to call immediately if he has any other concerning symptoms in the interval. The patient voices understanding of current disease status and treatment options and is in agreement with the current care plan.  All questions were answered. The patient knows to call the clinic with any problems, questions or concerns. We can certainly see the patient much sooner if necessary.  Thank you so much for allowing me to participate in the care of [name]. I will continue to follow up the patient with you and assist in his care.  The total time spent in the appointment was 90 minutes.  Disclaimer: This note was dictated with voice recognition software. Similar sounding words can inadvertently be transcribed and may not be corrected upon review.   Eilleen Kempf June 26, 2022, 11:53 AM

## 2022-06-26 NOTE — Progress Notes (Signed)
Today was the pt's MedOnc consult with Dr.Mohamed and meeting the Navigator for the first time.  Navigator introduced self and explained the role of a nurse navigator. Upon assessment, pt has very few barriers. Pt is retired and lives with his Joslyn Hy and his wife.  Navigator explained that his treatment will involve 6 weeks of chemotherapy and radiation and would require him being at the clinic on a daily basis M-F. Pt understands and declined transportation assistance as his stepson and his wife will be able to provide him rides to his appts.  He denies financial concerns and food disparities.  Dr.Mohamed has ordered a CT scan for the patient and navigator will schedule that for him.  Dr Julien Nordmann referral to Radiation Oncology and navigator let the pt know that someone from [facility] will contact him soon to arrange his treatments.  Tentative treatment start date is 07/10/22.  Pt verbalized understanding of information provided. Navigator provided pt with card and direct contact information and encouraged the pt to call with any questions or concerns.

## 2022-06-27 ENCOUNTER — Encounter (HOSPITAL_COMMUNITY): Payer: Self-pay | Admitting: Pulmonary Disease

## 2022-06-27 ENCOUNTER — Telehealth: Payer: Self-pay | Admitting: Radiation Oncology

## 2022-06-27 ENCOUNTER — Encounter (HOSPITAL_COMMUNITY): Payer: Self-pay

## 2022-06-27 ENCOUNTER — Ambulatory Visit (HOSPITAL_COMMUNITY): Admission: RE | Admit: 2022-06-27 | Payer: Medicare HMO | Source: Ambulatory Visit

## 2022-06-27 DIAGNOSIS — C3490 Malignant neoplasm of unspecified part of unspecified bronchus or lung: Secondary | ICD-10-CM | POA: Diagnosis not present

## 2022-06-27 LAB — CUP PACEART REMOTE DEVICE CHECK
Date Time Interrogation Session: 20240225231622
Implantable Pulse Generator Implant Date: 20161115

## 2022-06-27 NOTE — Progress Notes (Signed)
CT scheduled for 8am on 3/1. Navigator reached out to patient to notify him of date/time. No answer at pt's number and unable to leave a VM. Will continue to reach out.

## 2022-06-27 NOTE — Telephone Encounter (Signed)
2/27 @ 11:49 am called patient's contact # twice no answer/voicemail has not been setup yet.  Also called patient's son # not available.

## 2022-06-28 ENCOUNTER — Other Ambulatory Visit: Payer: Self-pay

## 2022-06-28 DIAGNOSIS — I119 Hypertensive heart disease without heart failure: Secondary | ICD-10-CM | POA: Diagnosis not present

## 2022-06-28 DIAGNOSIS — I700 Atherosclerosis of aorta: Secondary | ICD-10-CM | POA: Diagnosis not present

## 2022-06-28 DIAGNOSIS — H547 Unspecified visual loss: Secondary | ICD-10-CM | POA: Diagnosis not present

## 2022-06-28 DIAGNOSIS — I69398 Other sequelae of cerebral infarction: Secondary | ICD-10-CM | POA: Diagnosis not present

## 2022-06-28 DIAGNOSIS — M1712 Unilateral primary osteoarthritis, left knee: Secondary | ICD-10-CM | POA: Diagnosis not present

## 2022-06-28 DIAGNOSIS — E1169 Type 2 diabetes mellitus with other specified complication: Secondary | ICD-10-CM | POA: Diagnosis not present

## 2022-06-28 DIAGNOSIS — J439 Emphysema, unspecified: Secondary | ICD-10-CM | POA: Diagnosis not present

## 2022-06-28 DIAGNOSIS — E7800 Pure hypercholesterolemia, unspecified: Secondary | ICD-10-CM | POA: Diagnosis not present

## 2022-06-28 DIAGNOSIS — L089 Local infection of the skin and subcutaneous tissue, unspecified: Secondary | ICD-10-CM | POA: Diagnosis not present

## 2022-06-28 NOTE — Progress Notes (Signed)
Thoracic Location of Tumor / Histology:  Malignant neoplasm of unspecified part of unspecified bronchus or lung   Patient presented  months with symptoms of:  During his evaluation for the stroke he had MRI of the brain without contrast on March 13, 2022 that showed a suspicious acute infarct in the right centrum semiovale extending to the parietal lobe cortex without hemorrhage or mass effect.  The patient also had CT angiogram of the chest on the same day and that showed nonspecific new mediastinal and right hilar lymphadenopathy since March 2023 and lymphoproliferative disorder or metastatic disease could not be excluded.  He was referred to Dr. Vaughan Browner and a PET scan was performed on April 21, 2022 and that showed hypermetabolic mediastinal and right hilar/infrahilar nodes including subcarinal lymph node measuring 1.9 cm with SUV max of 8.4.  There was also right infrahilar adenopathy measuring 1.8 cm with SUV max of 8.1.  On June 12, 2022 the patient underwent video bronchoscopy with EBUS.  The final cytology ([id number]) of the fine-needle aspiration of station 7 lymph node malignant cells consistent with adenocarcinoma.  The patient was referred to me today for evaluation and recommendation regarding treatment of his condition.   Biopsies revealed:    Tobacco/Marijuana/Snuff/ETOH use: former smoker  Past/Anticipated interventions by cardiothoracic surgery, if any:  Marshell Garfinkel, MD  Physician Critical Care   Op Note     Signed   Date of Service: 06/12/2022 10:27 AM   Signed      [facility] Patient Name: Gavin Dennis Procedure Date: 06/12/2022 MRN: [id number] Attending MD: Marshell Garfinkel , MD, [id number] Date of Birth: [birth year] CSN: [id number] Age: 42 Admit Type: Outpatient Ethnicity: Unknown Procedure:             Bronchoscopy Indications:           Mediastinal adenopathy Providers:             Marshell Garfinkel, MD, Dulcy Fanny, Darliss Cheney,           Past/Anticipated interventions by medical oncology, if any:  ASSESSMENT:  Dr. Julien Nordmann 06/26/2022 -I discussed with the patient his treatment options and I recommended for him a course of concurrent chemoradiation with weekly carboplatin for AUC of 2 and paclitaxel 45 Mg/M2 for 6-7 weeks followed by consolidation immunotherapy if the patient has no disease progression. -The patient will have a chemotherapy education class before the first dose of his treatment. -He is expected to start the first dose of this treatment on July 10, 2022.   Signs/Symptoms Weight changes, if any: No Respiratory complaints, if any: He reports breathing difficulty after a coughing spell. Hemoptysis, if any: He reports increased cough, causes chest tightness. Pain issues, if any:  Reports some chest tightness with coughing.  SAFETY ISSUES: Prior radiation? No Pacemaker/ICD?  No Pacemaker. Loop recorder left chest Possible current pregnancy? N/a Is the patient on methotrexate? No  Current Complaints / other details:

## 2022-06-30 ENCOUNTER — Ambulatory Visit (HOSPITAL_COMMUNITY)
Admission: RE | Admit: 2022-06-30 | Discharge: 2022-06-30 | Disposition: A | Discharge status: Home / Self Care | Payer: Medicare HMO | Source: Ambulatory Visit | Attending: Internal Medicine | Admitting: Internal Medicine

## 2022-06-30 ENCOUNTER — Encounter (HOSPITAL_BASED_OUTPATIENT_CLINIC_OR_DEPARTMENT_OTHER): Payer: Medicare HMO | Admitting: Internal Medicine

## 2022-06-30 ENCOUNTER — Ambulatory Visit (HOSPITAL_COMMUNITY): Admission: RE | Admit: 2022-06-30 | Payer: Medicare HMO | Source: Ambulatory Visit

## 2022-06-30 DIAGNOSIS — E7800 Pure hypercholesterolemia, unspecified: Secondary | ICD-10-CM | POA: Diagnosis not present

## 2022-06-30 DIAGNOSIS — H547 Unspecified visual loss: Secondary | ICD-10-CM | POA: Diagnosis not present

## 2022-06-30 DIAGNOSIS — J432 Centrilobular emphysema: Secondary | ICD-10-CM | POA: Diagnosis not present

## 2022-06-30 DIAGNOSIS — J9811 Atelectasis: Secondary | ICD-10-CM | POA: Diagnosis not present

## 2022-06-30 DIAGNOSIS — M1712 Unilateral primary osteoarthritis, left knee: Secondary | ICD-10-CM | POA: Diagnosis not present

## 2022-06-30 DIAGNOSIS — I700 Atherosclerosis of aorta: Secondary | ICD-10-CM | POA: Diagnosis not present

## 2022-06-30 DIAGNOSIS — I119 Hypertensive heart disease without heart failure: Secondary | ICD-10-CM | POA: Diagnosis not present

## 2022-06-30 DIAGNOSIS — C3490 Malignant neoplasm of unspecified part of unspecified bronchus or lung: Secondary | ICD-10-CM | POA: Insufficient documentation

## 2022-06-30 DIAGNOSIS — E1169 Type 2 diabetes mellitus with other specified complication: Secondary | ICD-10-CM | POA: Diagnosis not present

## 2022-06-30 DIAGNOSIS — I69398 Other sequelae of cerebral infarction: Secondary | ICD-10-CM | POA: Diagnosis not present

## 2022-06-30 DIAGNOSIS — L089 Local infection of the skin and subcutaneous tissue, unspecified: Secondary | ICD-10-CM | POA: Diagnosis not present

## 2022-06-30 DIAGNOSIS — J439 Emphysema, unspecified: Secondary | ICD-10-CM | POA: Diagnosis not present

## 2022-06-30 MED ORDER — SODIUM CHLORIDE (PF) 0.9 % IJ SOLN
INTRAMUSCULAR | Status: AC
  Filled 2022-06-30: qty 50

## 2022-06-30 MED ORDER — IOHEXOL 300 MG/ML  SOLN
75.0000 mL | Freq: Once | INTRAMUSCULAR | Status: AC | PRN
  Administered 2022-06-30: 75 mL via INTRAVENOUS

## 2022-06-30 NOTE — Progress Notes (Signed)
Called pt at 10:46 to inquire as to why he no-showed to his CT appt at 8am. Pt says "he's here" meaning at the [facility] radiology area and confirms that he was there before 8am. Navigator called CT for guidance. Spoke to [facility]. Sherri states he didn't show up to radiology department, but thinks he may have go to [employer] and said she would check and call navigator back.  Navigator also checked [facility] waiting area and pt was not there.  Tedra Coupe from [facility] called navigator and informed her that the pt was in the central scheduling area and that they would take him back to scan him.  Navigator expressed thanks and appreciation for CT staff assistance and flexibility.

## 2022-07-03 ENCOUNTER — Inpatient Hospital Stay: Payer: Medicare HMO

## 2022-07-03 DIAGNOSIS — Z5111 Encounter for antineoplastic chemotherapy: Secondary | ICD-10-CM | POA: Insufficient documentation

## 2022-07-03 DIAGNOSIS — M1712 Unilateral primary osteoarthritis, left knee: Secondary | ICD-10-CM | POA: Diagnosis not present

## 2022-07-03 DIAGNOSIS — C3490 Malignant neoplasm of unspecified part of unspecified bronchus or lung: Secondary | ICD-10-CM

## 2022-07-03 DIAGNOSIS — I69398 Other sequelae of cerebral infarction: Secondary | ICD-10-CM | POA: Diagnosis not present

## 2022-07-03 DIAGNOSIS — Z79899 Other long term (current) drug therapy: Secondary | ICD-10-CM | POA: Insufficient documentation

## 2022-07-03 DIAGNOSIS — I700 Atherosclerosis of aorta: Secondary | ICD-10-CM | POA: Diagnosis not present

## 2022-07-03 DIAGNOSIS — L089 Local infection of the skin and subcutaneous tissue, unspecified: Secondary | ICD-10-CM | POA: Diagnosis not present

## 2022-07-03 DIAGNOSIS — E1169 Type 2 diabetes mellitus with other specified complication: Secondary | ICD-10-CM | POA: Diagnosis not present

## 2022-07-03 DIAGNOSIS — Z8673 Personal history of transient ischemic attack (TIA), and cerebral infarction without residual deficits: Secondary | ICD-10-CM | POA: Insufficient documentation

## 2022-07-03 DIAGNOSIS — H547 Unspecified visual loss: Secondary | ICD-10-CM | POA: Diagnosis not present

## 2022-07-03 DIAGNOSIS — E7800 Pure hypercholesterolemia, unspecified: Secondary | ICD-10-CM | POA: Diagnosis not present

## 2022-07-03 DIAGNOSIS — J439 Emphysema, unspecified: Secondary | ICD-10-CM | POA: Diagnosis not present

## 2022-07-03 DIAGNOSIS — I808 Phlebitis and thrombophlebitis of other sites: Secondary | ICD-10-CM | POA: Insufficient documentation

## 2022-07-03 DIAGNOSIS — I119 Hypertensive heart disease without heart failure: Secondary | ICD-10-CM | POA: Diagnosis not present

## 2022-07-03 NOTE — Progress Notes (Signed)
Pharmacist Chemotherapy Monitoring - Initial Assessment    Anticipated start date: 07/10/2022   The following has been reviewed per standard work regarding the patient's treatment regimen: The patient's diagnosis, treatment plan and drug doses, and organ/hematologic function Lab orders and baseline tests specific to treatment regimen  The treatment plan start date, drug sequencing, and pre-medications Prior authorization status  Patient's documented medication list, including drug-drug interaction screen and prescriptions for anti-emetics and supportive care specific to the treatment regimen The drug concentrations, fluid compatibility, administration routes, and timing of the medications to be used The patient's access for treatment and lifetime cumulative dose history, if applicable  The patient's medication allergies and previous infusion related reactions, if applicable   Changes made to treatment plan:  N/A  Follow up needed:  N/A   Gavin Dennis, RPH, 07/03/2022  1:44 PM

## 2022-07-04 ENCOUNTER — Encounter (HOSPITAL_BASED_OUTPATIENT_CLINIC_OR_DEPARTMENT_OTHER): Payer: Medicare HMO | Admitting: Internal Medicine

## 2022-07-04 NOTE — Progress Notes (Signed)
Radiation Oncology         [phone] ________________________________  Initial Outpatient Consultation  Name: Gavin Dennis MRN: [id number]  Date: 07/05/2022  DOB: 12/02/1950  [id number], Gavin Heinrich, MD  Curt Bears, MD   REFERRING PHYSICIAN: Curt Bears, MD  DIAGNOSIS: The encounter diagnosis was Malignant neoplasm of unspecified part of unspecified bronchus or lung ([place]).  Stage IIIb (T0 N2, M0) non-small cell lung cancer, adenocarcinoma presented with right hilar and mediastinal lymphadenopathy without a definitive primary diagnosed in February 2024.  HISTORY OF PRESENT ILLNESS::Gavin Dennis is a 72 y.o. male former smoker with a 57 year smoking history. Today, he is accompanied by ***. he is seen as a courtesy of Dr. Julien Nordmann for an opinion concerning radiation therapy as part of management for his recently diagnosed lung cancer.   The patient initially presented for a lung cancer screening CT on 07/01/2021 which showed 3 likely benign scattered pulmonary nodules, the largest of which being a left upper lobe nodule measuring 11.0 mm in volume derived mean diameter, with tiny 3 mm solid components (Lung-RADS 3). CT also revealed a 4.5 cm ascending thoracic aortic aneurysm (followed by cardiology).   This past November, the patient was hospitalized from 03/12/22 through 03/15/22 for management of acute embolic CVA. On presentation, the patient was observed to be confused and delirious. CTA of the chest performed while inpatient on 03/13/22 incidentally showed new nonspecific mediastinal and right hilar lymphadenopathy suspicious for metastatic disease vs a lymphoproliferative disorder. CTA also showed new plate-like atelectasis but otherwise stable chronic lung disease with emphysema. No evidence of PE was seen, and the previously noted ascending thoracic aortic aneurysm appeared to be stable. Other imaging performed while inpatient includes:  -- CT of the head on 11/12 which showed no  acute intracranial abnormalities. -- MRI of the brain without contrast on 11/13 showed: an acute infarct in the right centrum semiovale extending to the parietal lobe cortex without hemorrhage or mass effect; small remote lacunar infarcts; and scattered punctate chronic microhemorrhages which were likely hypertensive in etiology. (MRI of the neck also performed was unremarkable. However, this study was very limited secondary to motion artifact and non-contrast evaluation).   Following discharge, the patient was referred to Dr. Vaughan Browner at [facility] for further evaluation of mediastinal/hilar lymphadenopathy.  The patient opted to proceed with bronchoscopy for nodal biopsies on 06/12/22. FNA of a station 7 lymph node showed malignant cells consistent with adenocarcinoma. Guardant360 showed positive KRAS G12C mutation and PD-L1 expression of 53%.    Accordingly, the patient was referred to Dr. Julien Nordmann on 06/26/22 for further management. During this visit, the patient endorsed a 10 pound weight loss over the last several weeks and a cough. He denied an SOB, chest pain, or hemoptysis. In terms of treatment options, Dr. Julien Nordmann recommends concurrent chemoradiation with weekly carboplatin and paclitaxel x 6-7 weeks followed by consolidation immunotherapy if the patient has no disease progression. He is scheduled to receive his first infusion next week on 07/10/22.   Pertinent imaging thus far includes a chest CT with contrast on 06/30/22 (for reevaluation of his disease) which demonstrates relatively stable mediastinal and right hilar lymphadenopathy. However, CT also shows a new developing nodular consolidative opacity in the dependent RUL measuring 3.0 x 1.7 cm, as well as a new 5 mm nodule in the middle lobe.    PREVIOUS RADIATION THERAPY: No  PAST MEDICAL HISTORY:  Past Medical History:  Diagnosis Date   Blurred vision, left eye  since stroke 06/2020   CVA (cerebral vascular accident) ([place])  06/2020   DDD (degenerative disc disease), lumbar    Diabetes mellitus without complication ([place])    Hyperlipidemia    Hypertension    Hypomagnesemia    Polio    childhood   Vitamin B 12 deficiency     PAST SURGICAL HISTORY: Past Surgical History:  Procedure Laterality Date   BRONCHIAL NEEDLE ASPIRATION BIOPSY  06/12/2022   Procedure: BRONCHIAL NEEDLE ASPIRATION BIOPSIES;  Surgeon: Marshell Garfinkel, MD;  Location: [facility];  Service: Cardiopulmonary;;   COLONOSCOPY     ENDOBRONCHIAL ULTRASOUND Bilateral 06/12/2022   Procedure: ENDOBRONCHIAL ULTRASOUND;  Surgeon: Marshell Garfinkel, MD;  Location: [facility];  Service: Cardiopulmonary;  Laterality: Bilateral;   HEMOSTASIS CONTROL  06/12/2022   Procedure: HEMOSTASIS CONTROL;  Surgeon: Marshell Garfinkel, MD;  Location: [facility];  Service: Cardiopulmonary;;   LOOP RECORDER INSERTION N/A 03/15/2022   Procedure: LOOP RECORDER INSERTION;  Surgeon: Melida Quitter, MD;  Location: [facility];  Service: Cardiovascular;  Laterality: N/A;   VIDEO BRONCHOSCOPY N/A 06/12/2022   Procedure: VIDEO BRONCHOSCOPY WITHOUT FLUORO;  Surgeon: Marshell Garfinkel, MD;  Location: [facility];  Service: Cardiopulmonary;  Laterality: N/A;    FAMILY HISTORY:  Family History  Problem Relation Age of Onset   Heart Problems Mother    Prostate cancer Father    Lung cancer Brother     SOCIAL HISTORY:  Social History   Tobacco Use   Smoking status: Former    Packs/day: 1.00    Years: 30.00    Total pack years: 30.00    Types: Cigarettes    Quit date: 05/02/2019    Years since quitting: 3.1   Smokeless tobacco: Never   Tobacco comments:    quit a mo ago  Substance Use Topics   Alcohol use: Not Currently    Comment: quit a mo ago   Drug use: Not Currently    Types: Cocaine    Comment: hx of abuse    ALLERGIES: No Known Allergies  MEDICATIONS:  Current Outpatient Medications  Medication Sig Dispense Refill   amLODipine (NORVASC) 10 MG  tablet Take 1 tablet (10 mg total) by mouth daily. 90 tablet 0   aspirin EC 81 MG tablet Take 81 mg by mouth in the morning. Swallow whole. (Patient not taking: Reported on 06/26/2022)     atorvastatin (LIPITOR) 20 MG tablet Take 20 mg by mouth in the morning.     BREZTRI AEROSPHERE 160-9-4.8 MCG/ACT AERO Inhale 2 puffs into the lungs daily.     carvedilol (COREG) 6.25 MG tablet Take 1 tablet (6.25 mg total) by mouth 2 (two) times daily. 60 tablet 5   clopidogrel (PLAVIX) 75 MG tablet Take 1 tablet (75 mg total) by mouth daily. (Patient not taking: Reported on 06/26/2022) 30 tablet 3   Cyanocobalamin (VITAMIN B-12 PO) Take 1 tablet by mouth daily.     metFORMIN (GLUCOPHAGE) 500 MG tablet Take 500 mg by mouth in the morning.     metoprolol succinate (TOPROL-XL) 25 MG 24 hr tablet Take 25 mg by mouth daily.     Omega-3 Fatty Acids (OMEGA-3 PO) Take 1 capsule by mouth in the morning. OmegaXL Joint Pain Relief & Inflammation Supplement     omeprazole (PRILOSEC) 40 MG capsule Take 40 mg by mouth daily as needed (indigestion.).     potassium chloride (KLOR-CON) 10 MEQ tablet Take 10 mEq by mouth daily.     prochlorperazine (COMPAZINE) 10 MG tablet  Take 1 tablet (10 mg total) by mouth every 6 (six) hours as needed for nausea or vomiting. 30 tablet 0   Study - OCEANIC-STROKE - asundexian 50 mg or placebo tablet (PI-Sethi) Take 1 tablet (50 mg total) by mouth daily. For Investigational Use Only. Take at the same time each day (preferably in the morning). Tablet should be swallowed whole with water; it CANNOT be crushed or broken. (Patient not taking: Reported on 06/26/2022) 98 tablet 0   No current facility-administered medications for this encounter.    REVIEW OF SYSTEMS:  A 10+ POINT REVIEW OF SYSTEMS WAS OBTAINED including neurology, dermatology, psychiatry, cardiac, respiratory, lymph, extremities, GI, GU, musculoskeletal, constitutional, reproductive, HEENT. ***   PHYSICAL EXAM:  vitals were not taken  for this visit.   General: Alert and oriented, in no acute distress HEENT: Head is normocephalic. Extraocular movements are intact. Oropharynx is clear. Neck: Neck is supple, no palpable cervical or supraclavicular lymphadenopathy. Heart: Regular in rate and rhythm with no murmurs, rubs, or gallops. Chest: Clear to auscultation bilaterally, with no rhonchi, wheezes, or rales. Abdomen: Soft, nontender, nondistended, with no rigidity or guarding. Extremities: No cyanosis or edema. Lymphatics: see Neck Exam Skin: No concerning lesions. Musculoskeletal: symmetric strength and muscle tone throughout. Neurologic: Cranial nerves II through XII are grossly intact. No obvious focalities. Speech is fluent. Coordination is intact. Psychiatric: Judgment and insight are intact. Affect is appropriate. ***  ECOG = ***  0 - Asymptomatic (Fully active, able to carry on all predisease activities without restriction)  1 - Symptomatic but completely ambulatory (Restricted in physically strenuous activity but ambulatory and able to carry out work of a light or sedentary nature. For example, light housework, office work)  2 - Symptomatic, <50% in bed during the day (Ambulatory and capable of all self care but unable to carry out any work activities. Up and about more than 50% of waking hours)  3 - Symptomatic, >50% in bed, but not bedbound (Capable of only limited self-care, confined to bed or chair 50% or more of waking hours)  4 - Bedbound (Completely disabled. Cannot carry on any self-care. Totally confined to bed or chair)  5 - Death   Gavin Dennis MM, Creech RH, Tormey DC, et al. [phone]). "Toxicity and response criteria of the [facility] Group". [place] Oncol. 5 (6): 649-55  LABORATORY DATA:  Lab Results  Component Value Date   WBC 4.8 06/26/2022   HGB 14.1 06/26/2022   HCT 41.1 06/26/2022   MCV 89.9 06/26/2022   PLT 173 06/26/2022   NEUTROABS 2.2 06/26/2022   Lab Results   Component Value Date   NA 138 06/26/2022   K 3.9 06/26/2022   CL 106 06/26/2022   CO2 26 06/26/2022   GLUCOSE 120 (H) 06/26/2022   BUN 16 06/26/2022   CREATININE 0.93 06/26/2022   CALCIUM 9.5 06/26/2022      RADIOGRAPHY: CT Chest W Contrast  Result Date: 07/02/2022 CLINICAL DATA:  Staging non-small-cell lung cancer. * Tracking Code: BO * EXAM: CT CHEST WITH CONTRAST TECHNIQUE: Multidetector CT imaging of the chest was performed during intravenous contrast administration. RADIATION DOSE REDUCTION: This exam was performed according to the departmental dose-optimization program which includes automated exposure control, adjustment of the mA and/or kV according to patient size and/or use of iterative reconstruction technique. CONTRAST:  40m OMNIPAQUE IOHEXOL 300 MG/ML  SOLN COMPARISON:  Chest CT angiogram 03/13/2022.  PET-CT 04/21/2022 FINDINGS: Cardiovascular: Heart is nonenlarged. Trace pericardial fluid. Coronary artery calcifications  are seen. There is atherosclerotic plaque as well diffusely scattered along the thoracic aorta. Diameter of the ascending aorta at the level of the right pulmonary artery measures 4.2 x 4.3 cm. Mediastinum/Nodes: Small cystic foci again noted along the thyroid gland. Normal caliber thoracic esophagus. No specific abnormal lymph node enlargement identified in the axillary region. There are several abnormal nodes once again seen in the mediastinum and right hilum. Subcarinal node which previously measured in November 2023 3.1 by 1.7 cm, today on series 2, image 79 3.0 x 1.9 cm. This has some small calcifications. On the PET-CT from December 2023 this would have measured 2.7 by 1.9 cm. Paratracheal node today on series 64 of series 2 which measures 2.1 x 1.7 cm, in November 2023 when it measured 17 x 1.1 cm. In December node would have measured 17 by 13 mm. Slightly larger today. Node upper right paratracheal which on the prior measured 15 by 23 mm, today on image 50 of  series 2 measures 20 x 13 mm. December 2023 this would have measured 21 x 13 mm. Similar. Abnormal right hilar lymph node also identified. Today on series 2, image 76 measures 4.4 by 2.5 cm and previously when measured in the same fashion would have measured 2.3 x 4.1 cm. Similar other hilar nodes are also noted and are relatively similar when adjusting for technique. The hilar nodes are more difficult to measure on the PET-CT scan. This is technical without IV contrast on the attenuation correction CT for that exam. Lungs/Pleura: Centrilobular emphysematous lung changes are identified. No pneumothorax or effusion. There is some left basilar atelectasis but improved from November 2023. Similar to the study of December 2023. there is developing confluence ill-defined opacity in the dependent right upper lobe with a nodular area on image 73 of series 5 measuring up to 3.0 by 1.7 cm. This would have a differential. There also is a 5 mm nodule which is new as well in the middle lobe on image 82. Persistent atelectasis or scar along the inferior middle lobe. Upper Abdomen: Adrenal glands are preserved in the upper abdomen. Multiple renal cystic foci as seen on prior examination. Musculoskeletal: Slight curvature of the spine. Mild degenerative changes. IMPRESSION: Multiple abnormal lymph nodes identified in the mediastinum and right hilum. Overall relatively similar when adjusting for technique compared to the previous examination. Developing nodular consolidative opacity in the dependent right upper lobe there has also a new 5 mm nodule in the middle lobe. This would have a differential including infectious, inflammatory or neoplasm. Recommend correlation to specific symptoms and short follow-up Aortic Atherosclerosis (ICD10-I70.0) and Emphysema (ICD10-J43.9). Electronically Signed   By: Jill Side M.D.   On: 07/02/2022 17:52   CUP PACEART REMOTE DEVICE CHECK  Result Date: 06/27/2022 ILR summary report received.  Battery status OK. Normal device function. No new symptom, tachy, brady, or pause episodes. No new AF episodes. Monthly summary reports and ROV/PRN LA  DG Knee 3 Views Left  Result Date: 06/20/2022 CLINICAL DATA:  Anterior knee pain.  No known injury. EXAM: LEFT KNEE - 3 VIEW COMPARISON:  None Available. FINDINGS: Mild tricompartmental joint space narrowing and peripheral osteophytosis, greatest within the lateral compartment. Moderate superior patellar degenerative osteophyte. No acute fracture or dislocation. Small joint effusion. Moderate vascular calcifications. IMPRESSION: Mild tricompartmental osteoarthritis, greatest within the lateral compartment. Electronically Signed   By: Yvonne Kendall M.D.   On: 06/20/2022 14:46   DG CHEST PORT 1 VIEW  Result Date: 06/12/2022 CLINICAL DATA:  Status post bronchoscopy. EXAM: PORTABLE CHEST 1 VIEW COMPARISON:  PET 04/21/2022, 03/13/2022 and CT chest 03/13/2022. FINDINGS: Trachea is midline. Heart size is accentuated by technique. Loop recorder projects over the left chest. Mild bibasilar atelectasis. No airspace consolidation or definite pleural fluid. No pneumothorax. IMPRESSION: 1. No pneumothorax. 2. Mild bibasilar atelectasis. 3. Mediastinal and hilar adenopathy better seen on cross-sectional imaging. Electronically Signed   By: Lorin Picket M.D.   On: 06/12/2022 12:47      IMPRESSION: Stage IIIb (T0 N2, M0) non-small cell lung cancer, adenocarcinoma presented with right hilar and mediastinal lymphadenopathy without a definitive primary diagnosed in February 2024  ***  Today, I talked to the patient and family about the findings and work-up thus far.  We discussed the natural history of *** and general treatment, highlighting the role of radiotherapy in the management.  We discussed the available radiation techniques, and focused on the details of logistics and delivery.  We reviewed the anticipated acute and late sequelae associated with radiation in  this setting.  The patient was encouraged to ask questions that I answered to the best of my ability. *** A patient consent form was discussed and signed.  We retained a copy for our records.  The patient would like to proceed with radiation and will be scheduled for CT simulation.  PLAN: ***    *** minutes of total time was spent for this patient encounter, including preparation, face-to-face counseling with the patient and coordination of care, physical exam, and documentation of the encounter.   ------------------------------------------------  Blair Promise, PhD, MD  This document serves as a record of services personally performed by Gery Pray, MD. It was created on his behalf by Roney Mans, a trained medical scribe. The creation of this record is based on the scribe's personal observations and the provider's statements to them. This document has been checked and approved by the attending provider.

## 2022-07-05 ENCOUNTER — Ambulatory Visit
Admission: RE | Admit: 2022-07-05 | Discharge: 2022-07-05 | Disposition: A | Discharge status: Home / Self Care | Payer: Medicare HMO | Source: Ambulatory Visit | Attending: Radiation Oncology | Admitting: Radiation Oncology

## 2022-07-05 ENCOUNTER — Encounter: Payer: Self-pay | Admitting: Radiation Oncology

## 2022-07-05 ENCOUNTER — Other Ambulatory Visit: Payer: Self-pay

## 2022-07-05 VITALS — BP 114/90 | HR 90 | Temp 97.8°F | Resp 20 | Ht 71.0 in | Wt 204.8 lb

## 2022-07-05 DIAGNOSIS — C3490 Malignant neoplasm of unspecified part of unspecified bronchus or lung: Secondary | ICD-10-CM | POA: Diagnosis not present

## 2022-07-05 DIAGNOSIS — C801 Malignant (primary) neoplasm, unspecified: Secondary | ICD-10-CM | POA: Diagnosis not present

## 2022-07-05 DIAGNOSIS — C781 Secondary malignant neoplasm of mediastinum: Secondary | ICD-10-CM | POA: Diagnosis not present

## 2022-07-05 DIAGNOSIS — Z9221 Personal history of antineoplastic chemotherapy: Secondary | ICD-10-CM | POA: Insufficient documentation

## 2022-07-05 DIAGNOSIS — Z923 Personal history of irradiation: Secondary | ICD-10-CM | POA: Insufficient documentation

## 2022-07-06 DIAGNOSIS — E1169 Type 2 diabetes mellitus with other specified complication: Secondary | ICD-10-CM | POA: Diagnosis not present

## 2022-07-06 DIAGNOSIS — J439 Emphysema, unspecified: Secondary | ICD-10-CM | POA: Diagnosis not present

## 2022-07-06 DIAGNOSIS — I69398 Other sequelae of cerebral infarction: Secondary | ICD-10-CM | POA: Diagnosis not present

## 2022-07-06 DIAGNOSIS — E7800 Pure hypercholesterolemia, unspecified: Secondary | ICD-10-CM | POA: Diagnosis not present

## 2022-07-06 DIAGNOSIS — H547 Unspecified visual loss: Secondary | ICD-10-CM | POA: Diagnosis not present

## 2022-07-06 DIAGNOSIS — M1712 Unilateral primary osteoarthritis, left knee: Secondary | ICD-10-CM | POA: Diagnosis not present

## 2022-07-06 DIAGNOSIS — I119 Hypertensive heart disease without heart failure: Secondary | ICD-10-CM | POA: Diagnosis not present

## 2022-07-06 DIAGNOSIS — L089 Local infection of the skin and subcutaneous tissue, unspecified: Secondary | ICD-10-CM | POA: Diagnosis not present

## 2022-07-06 DIAGNOSIS — I700 Atherosclerosis of aorta: Secondary | ICD-10-CM | POA: Diagnosis not present

## 2022-07-07 ENCOUNTER — Ambulatory Visit (INDEPENDENT_AMBULATORY_CARE_PROVIDER_SITE_OTHER): Payer: Medicare HMO | Admitting: Pulmonary Disease

## 2022-07-07 ENCOUNTER — Encounter: Payer: Self-pay | Admitting: Pulmonary Disease

## 2022-07-07 ENCOUNTER — Ambulatory Visit
Admission: RE | Admit: 2022-07-07 | Discharge: 2022-07-07 | Disposition: A | Discharge status: Home / Self Care | Payer: Medicare HMO | Source: Ambulatory Visit | Attending: Radiation Oncology | Admitting: Radiation Oncology

## 2022-07-07 VITALS — BP 128/76 | HR 64 | Temp 97.6°F | Ht 71.0 in | Wt 205.0 lb

## 2022-07-07 DIAGNOSIS — R0600 Dyspnea, unspecified: Secondary | ICD-10-CM

## 2022-07-07 DIAGNOSIS — C3490 Malignant neoplasm of unspecified part of unspecified bronchus or lung: Secondary | ICD-10-CM | POA: Diagnosis not present

## 2022-07-07 DIAGNOSIS — Z510 Encounter for antineoplastic radiation therapy: Secondary | ICD-10-CM | POA: Insufficient documentation

## 2022-07-07 DIAGNOSIS — Z87891 Personal history of nicotine dependence: Secondary | ICD-10-CM | POA: Insufficient documentation

## 2022-07-07 DIAGNOSIS — Z5111 Encounter for antineoplastic chemotherapy: Secondary | ICD-10-CM | POA: Diagnosis not present

## 2022-07-07 DIAGNOSIS — C801 Malignant (primary) neoplasm, unspecified: Secondary | ICD-10-CM | POA: Diagnosis not present

## 2022-07-07 DIAGNOSIS — C781 Secondary malignant neoplasm of mediastinum: Secondary | ICD-10-CM | POA: Diagnosis not present

## 2022-07-07 DIAGNOSIS — R918 Other nonspecific abnormal finding of lung field: Secondary | ICD-10-CM

## 2022-07-07 DIAGNOSIS — C771 Secondary and unspecified malignant neoplasm of intrathoracic lymph nodes: Secondary | ICD-10-CM | POA: Diagnosis not present

## 2022-07-07 MED FILL — Dexamethasone Sodium Phosphate Inj 100 MG/10ML: INTRAMUSCULAR | Qty: 1 | Status: AC

## 2022-07-07 NOTE — Patient Instructions (Signed)
I am glad you are doing well with your breathing and getting started on treatment for lung cancer Continue the inhalers Follow-up in 1 year

## 2022-07-07 NOTE — Progress Notes (Signed)
Gavin Dennis    [id number]    December 06, 1950  Primary Care Physician:Pahwani, Michell Heinrich, MD  Referring Physician: Mckinley Jewel, MD [address] [place],  [place] [address]  Chief complaint: Follow-up for non-small cell lung cancer  HPI: 72 y.o. who  has a past medical history of Blurred vision, left eye, CVA (cerebral vascular accident) ([place]) (06/2020), DDD (degenerative disc disease), lumbar, Diabetes mellitus without complication ([place]), Hyperlipidemia, Hypertension, Hypomagnesemia, Polio, and Vitamin B 12 deficiency.   He has been referred for evaluation of abnormal CT with mediastinal, hilar lymphadenopathy.  He was hospitalized in October [id number] for embolic stroke.  He was evaluated by neurology and CT on that admission showed incidental findings of mediastinal and hilar lymphadenopathy.  He has been referred here for further evaluation  History notable for COVID in 2022 when he developed CVA. He denies any cough, dyspnea or any other respiratory symptoms  Pets: No pets Occupation: Used to work in Control and instrumentation engineer, Architect, Chief Executive Officer. Exposures: Reports mold in the bathroom at home.  No hot tub, Tammi Klippel is a folliculitis Smoking history: 30-pack-year smoker.  Quit in 2021 Travel history: No significant travel history Relevant family history: No family history of lung disease  Interim history: Underwent endobronchial ultrasound biopsy on 06/12/2022 with diagnosis of non-small cell carcinoma on biopsy of station 7 lymph node.  He has been referred to oncology and is due to start concurrent chemotherapy and radiation followed by immunotherapy.  He is doing okay postprocedure with no respiratory complaints.  Continues on Breztri  Outpatient Encounter Medications as of 07/07/2022  Medication Sig   amLODipine (NORVASC) 10 MG tablet Take 1 tablet (10 mg total) by mouth daily.   atorvastatin (LIPITOR) 20 MG tablet Take 20 mg by mouth in the morning.   BREZTRI  AEROSPHERE 160-9-4.8 MCG/ACT AERO Inhale 2 puffs into the lungs daily.   carvedilol (COREG) 6.25 MG tablet Take 1 tablet (6.25 mg total) by mouth 2 (two) times daily.   clopidogrel (PLAVIX) 75 MG tablet Take 1 tablet (75 mg total) by mouth daily.   Cyanocobalamin (VITAMIN B-12 PO) Take 1 tablet by mouth daily.   metFORMIN (GLUCOPHAGE) 500 MG tablet Take 500 mg by mouth in the morning.   metoprolol succinate (TOPROL-XL) 25 MG 24 hr tablet Take 25 mg by mouth daily.   Omega-3 Fatty Acids (OMEGA-3 PO) Take 1 capsule by mouth in the morning. OmegaXL Joint Pain Relief & Inflammation Supplement   omeprazole (PRILOSEC) 40 MG capsule Take 40 mg by mouth daily as needed (indigestion.).   potassium chloride (KLOR-CON) 10 MEQ tablet Take 10 mEq by mouth daily.   prochlorperazine (COMPAZINE) 10 MG tablet Take 1 tablet (10 mg total) by mouth every 6 (six) hours as needed for nausea or vomiting.   aspirin EC 81 MG tablet Take 81 mg by mouth in the morning. Swallow whole. (Patient not taking: Reported on 06/26/2022)   Study - OCEANIC-STROKE - asundexian 50 mg or placebo tablet (PI-Sethi) Take 1 tablet (50 mg total) by mouth daily. For Investigational Use Only. Take at the same time each day (preferably in the morning). Tablet should be swallowed whole with water; it CANNOT be crushed or broken. (Patient not taking: Reported on 07/07/2022)   No facility-administered encounter medications on file as of 07/07/2022.   Physical Exam: Blood pressure 128/76, pulse 64, temperature 97.6 F (36.4 C), temperature source Oral, height '5\' 11"'$  (1.803 m), weight 205 lb (93  kg), SpO2 97 %. Gen:      No acute distress HEENT:  EOMI, sclera anicteric Neck:     No masses; no thyromegaly Lungs:    Clear to auscultation bilaterally; normal respiratory effort CV:         Regular rate and rhythm; no murmurs Abd:      + bowel sounds; soft, non-tender; no palpable masses, no distension Ext:    No edema; adequate peripheral  perfusion Skin:      Warm and dry; no rash Neuro: alert and oriented x 3 Psych: normal mood and affect   Data Reviewed: Imaging: CT lung cancer screening 07/01/2021-moderate emphysema, scattered pulmonary nodules measuring up to 11 mm.  CTA 03/13/2022-nonspecific mediastinal, right hilar lymphadenopathy which is new since March 2023.  Emphysema, negative for pulmonary embolism.  PET scan [id number]- hypermetabolic lymph nodes  CT chest 06/30/2022-mediastinal lymphadenopathy.  Nodular consolidative opacity in the right upper lobe.  I have reviewed the images personally. I have reviewed the images personally.  PFTs:  Labs:  Pathology: Fine-needle aspiration station 7 lymph node-malignant cells consistent with adenocarcinoma  Assessment:  Assessment for mediastinal, hilar lymphadenopathy Stage IIIb (T0 N2, M0) non-small cell lung cancer, adenocarcinoma This is a recent diagnosis after endobronchial ultrasound biopsy in February 2024 He is getting set up for concurrent chemoradiation followed by immunotherapy under the care of Dr. Julien Nordmann and Dr. Dwana Curd  Emphysema Likely has COPD Continue Breztri inhaler.  Will order PFTs at return visit  Plan/Recommendations: Continue Breztri Follow-up in 1 year  Marshell Garfinkel MD [facility] 07/07/2022, 11:21 AM  CC: Mckinley Jewel, MD

## 2022-07-07 NOTE — Progress Notes (Signed)
[facility] Summary Report / Loop Recorder 

## 2022-07-10 ENCOUNTER — Inpatient Hospital Stay: Payer: Medicare HMO

## 2022-07-10 ENCOUNTER — Other Ambulatory Visit: Payer: Self-pay

## 2022-07-10 ENCOUNTER — Inpatient Hospital Stay (HOSPITAL_BASED_OUTPATIENT_CLINIC_OR_DEPARTMENT_OTHER): Payer: Medicare HMO | Admitting: Internal Medicine

## 2022-07-10 ENCOUNTER — Other Ambulatory Visit: Payer: Self-pay | Admitting: Thoracic Surgery (Cardiothoracic Vascular Surgery)

## 2022-07-10 VITALS — BP 122/87 | HR 67 | Temp 98.8°F | Resp 18

## 2022-07-10 DIAGNOSIS — Z87891 Personal history of nicotine dependence: Secondary | ICD-10-CM | POA: Diagnosis not present

## 2022-07-10 DIAGNOSIS — I7121 Aneurysm of the ascending aorta, without rupture: Secondary | ICD-10-CM

## 2022-07-10 DIAGNOSIS — C771 Secondary and unspecified malignant neoplasm of intrathoracic lymph nodes: Secondary | ICD-10-CM | POA: Diagnosis not present

## 2022-07-10 DIAGNOSIS — Z5111 Encounter for antineoplastic chemotherapy: Secondary | ICD-10-CM | POA: Diagnosis not present

## 2022-07-10 DIAGNOSIS — Z510 Encounter for antineoplastic radiation therapy: Secondary | ICD-10-CM | POA: Diagnosis not present

## 2022-07-10 DIAGNOSIS — C3490 Malignant neoplasm of unspecified part of unspecified bronchus or lung: Secondary | ICD-10-CM | POA: Diagnosis not present

## 2022-07-10 LAB — CBC WITH DIFFERENTIAL (CANCER CENTER ONLY)
Abs Immature Granulocytes: 0.03 10*3/uL (ref 0.00–0.07)
Basophils Absolute: 0 10*3/uL (ref 0.0–0.1)
Basophils Relative: 0 %
Eosinophils Absolute: 0.2 10*3/uL (ref 0.0–0.5)
Eosinophils Relative: 3 %
HCT: 42.2 % (ref 39.0–52.0)
Hemoglobin: 14.1 g/dL (ref 13.0–17.0)
Immature Granulocytes: 1 %
Lymphocytes Relative: 33 %
Lymphs Abs: 1.8 10*3/uL (ref 0.7–4.0)
MCH: 30.3 pg (ref 26.0–34.0)
MCHC: 33.4 g/dL (ref 30.0–36.0)
MCV: 90.8 fL (ref 80.0–100.0)
Monocytes Absolute: 0.7 10*3/uL (ref 0.1–1.0)
Monocytes Relative: 13 %
Neutro Abs: 2.7 10*3/uL (ref 1.7–7.7)
Neutrophils Relative %: 50 %
Platelet Count: 302 10*3/uL (ref 150–400)
RBC: 4.65 MIL/uL (ref 4.22–5.81)
RDW: 14.9 % (ref 11.5–15.5)
WBC Count: 5.3 10*3/uL (ref 4.0–10.5)
nRBC: 0 % (ref 0.0–0.2)

## 2022-07-10 LAB — CMP (CANCER CENTER ONLY)
ALT: 17 U/L (ref 0–44)
AST: 14 U/L — ABNORMAL LOW (ref 15–41)
Albumin: 3.7 g/dL (ref 3.5–5.0)
Alkaline Phosphatase: 78 U/L (ref 38–126)
Anion gap: 7 (ref 5–15)
BUN: 28 mg/dL — ABNORMAL HIGH (ref 8–23)
CO2: 29 mmol/L (ref 22–32)
Calcium: 10.6 mg/dL — ABNORMAL HIGH (ref 8.9–10.3)
Chloride: 106 mmol/L (ref 98–111)
Creatinine: 1.08 mg/dL (ref 0.61–1.24)
GFR, Estimated: >60 mL/min (ref >60)
Glucose, Bld: 117 mg/dL — ABNORMAL HIGH (ref 70–99)
Potassium: 4 mmol/L (ref 3.5–5.1)
Sodium: 142 mmol/L (ref 135–145)
Total Bilirubin: 0.7 mg/dL (ref 0.3–1.2)
Total Protein: 7.5 g/dL (ref 6.5–8.1)

## 2022-07-10 MED ORDER — FAMOTIDINE IN NACL 20-0.9 MG/50ML-% IV SOLN
20.0000 mg | Freq: Once | INTRAVENOUS | Status: AC
  Administered 2022-07-10: 20 mg via INTRAVENOUS
  Filled 2022-07-10: qty 50

## 2022-07-10 MED ORDER — SODIUM CHLORIDE 0.9 % IV SOLN: Freq: Once | INTRAVENOUS | Status: AC

## 2022-07-10 MED ORDER — PALONOSETRON HCL INJECTION 0.25 MG/5ML
0.2500 mg | Freq: Once | INTRAVENOUS | Status: AC
  Administered 2022-07-10: 0.25 mg via INTRAVENOUS
  Filled 2022-07-10: qty 5

## 2022-07-10 MED ORDER — SODIUM CHLORIDE 0.9 % IV SOLN
234.6000 mg | Freq: Once | INTRAVENOUS | Status: AC
  Administered 2022-07-10: 230 mg via INTRAVENOUS
  Filled 2022-07-10: qty 23

## 2022-07-10 MED ORDER — SODIUM CHLORIDE 0.9 % IV SOLN
45.0000 mg/m2 | Freq: Once | INTRAVENOUS | Status: AC
  Administered 2022-07-10: 96 mg via INTRAVENOUS
  Filled 2022-07-10: qty 16

## 2022-07-10 MED ORDER — DIPHENHYDRAMINE HCL 50 MG/ML IJ SOLN
50.0000 mg | Freq: Once | INTRAMUSCULAR | Status: AC
  Administered 2022-07-10: 50 mg via INTRAVENOUS
  Filled 2022-07-10: qty 1

## 2022-07-10 MED ORDER — SODIUM CHLORIDE 0.9 % IV SOLN
10.0000 mg | Freq: Once | INTRAVENOUS | Status: AC
  Administered 2022-07-10: 10 mg via INTRAVENOUS
  Filled 2022-07-10: qty 10

## 2022-07-10 NOTE — Progress Notes (Signed)
[facility] [phone]   [phone]  OFFICE PROGRESS NOTE  Pahwani, Michell Heinrich, MD [address] [place] [address]  DIAGNOSIS:  Stage IIIb (T0 N2, M0) non-small cell lung cancer, adenocarcinoma presented with right hilar and mediastinal lymphadenopathy with no clear primary diagnosed in February 2024.  Molecular studies by [id number] that showed positive KRAS G12C mutation and PD-L1 expression of 53%.   PRIOR THERAPY: None  CURRENT THERAPY: Concurrent chemoradiation with weekly carboplatin for AUC of 2 and paclitaxel 45 Mg/M2.  First cycle July 10, 2022.  INTERVAL HISTORY: Gavin Dennis 72 y.o. male returns to the clinic today for follow-up visit.  The patient is feeling fine today with no concerning complaints.  He denied having any chest pain, shortness of breath but continues to have mild cough with whitish sputum and no hemoptysis.  He has no nausea, vomiting, diarrhea or constipation.  He has no headache or visual changes.  He has no recent weight loss or night sweats.  He had repeat CT scan of the chest performed recently that showed no concerning finding for worsening disease.  He is here for evaluation before starting the first cycle of his concurrent chemoradiation.  MEDICAL HISTORY: Past Medical History:  Diagnosis Date   Blurred vision, left eye    since stroke 06/2020   CVA (cerebral vascular accident) ([place]) 06/2020   DDD (degenerative disc disease), lumbar    Diabetes mellitus without complication ([facility])    Hyperlipidemia    Hypertension    Hypomagnesemia    Polio    childhood   Vitamin B 12 deficiency     ALLERGIES:  has No Known Allergies.  MEDICATIONS:  Current Outpatient Medications  Medication Sig Dispense Refill   amLODipine (NORVASC) 10 MG tablet Take 1 tablet (10 mg total) by mouth daily. 90 tablet 0   aspirin EC 81 MG tablet Take 81 mg by mouth in the morning. Swallow whole. (Patient not taking:  Reported on 06/26/2022)     atorvastatin (LIPITOR) 20 MG tablet Take 20 mg by mouth in the morning.     BREZTRI AEROSPHERE 160-9-4.8 MCG/ACT AERO Inhale 2 puffs into the lungs daily.     carvedilol (COREG) 6.25 MG tablet Take 1 tablet (6.25 mg total) by mouth 2 (two) times daily. 60 tablet 5   clopidogrel (PLAVIX) 75 MG tablet Take 1 tablet (75 mg total) by mouth daily. 30 tablet 3   Cyanocobalamin (VITAMIN B-12 PO) Take 1 tablet by mouth daily.     metFORMIN (GLUCOPHAGE) 500 MG tablet Take 500 mg by mouth in the morning.     metoprolol succinate (TOPROL-XL) 25 MG 24 hr tablet Take 25 mg by mouth daily.     Omega-3 Fatty Acids (OMEGA-3 PO) Take 1 capsule by mouth in the morning. OmegaXL Joint Pain Relief & Inflammation Supplement     omeprazole (PRILOSEC) 40 MG capsule Take 40 mg by mouth daily as needed (indigestion.).     potassium chloride (KLOR-CON) 10 MEQ tablet Take 10 mEq by mouth daily.     prochlorperazine (COMPAZINE) 10 MG tablet Take 1 tablet (10 mg total) by mouth every 6 (six) hours as needed for nausea or vomiting. 30 tablet 0   Study - OCEANIC-STROKE - asundexian 50 mg or placebo tablet (PI-Sethi) Take 1 tablet (50 mg total) by mouth daily. For Investigational Use Only. Take at the same time each day (preferably in the morning). Tablet should be swallowed whole  with water; it CANNOT be crushed or broken. (Patient not taking: Reported on 07/07/2022) 98 tablet 0   No current facility-administered medications for this visit.    SURGICAL HISTORY:  Past Surgical History:  Procedure Laterality Date   BRONCHIAL NEEDLE ASPIRATION BIOPSY  06/12/2022   Procedure: BRONCHIAL NEEDLE ASPIRATION BIOPSIES;  Surgeon: Marshell Garfinkel, MD;  Location: [facility];  Service: Cardiopulmonary;;   COLONOSCOPY     ENDOBRONCHIAL ULTRASOUND Bilateral 06/12/2022   Procedure: ENDOBRONCHIAL ULTRASOUND;  Surgeon: Marshell Garfinkel, MD;  Location: [facility];  Service: Cardiopulmonary;  Laterality: Bilateral;    HEMOSTASIS CONTROL  06/12/2022   Procedure: HEMOSTASIS CONTROL;  Surgeon: Marshell Garfinkel, MD;  Location: [facility];  Service: Cardiopulmonary;;   LOOP RECORDER INSERTION N/A 03/15/2022   Procedure: LOOP RECORDER INSERTION;  Surgeon: Melida Quitter, MD;  Location: [facility];  Service: Cardiovascular;  Laterality: N/A;   VIDEO BRONCHOSCOPY N/A 06/12/2022   Procedure: VIDEO BRONCHOSCOPY WITHOUT FLUORO;  Surgeon: Marshell Garfinkel, MD;  Location: [facility];  Service: Cardiopulmonary;  Laterality: N/A;    REVIEW OF SYSTEMS:  A comprehensive review of systems was negative except for: Constitutional: positive for fatigue Respiratory: positive for cough   PHYSICAL EXAMINATION: General appearance: alert, cooperative, fatigued, and no distress Head: Normocephalic, without obvious abnormality, atraumatic Neck: no adenopathy, no JVD, supple, symmetrical, trachea midline, and thyroid not enlarged, symmetric, no tenderness/mass/nodules Lymph nodes: Cervical, supraclavicular, and axillary nodes normal. Resp: clear to auscultation bilaterally Back: symmetric, no curvature. ROM normal. No CVA tenderness. Cardio: regular rate and rhythm, S1, S2 normal, no murmur, click, rub or gallop GI: soft, non-tender; bowel sounds normal; no masses,  no organomegaly Extremities: extremities normal, atraumatic, no cyanosis or edema  ECOG PERFORMANCE STATUS: 1 - Symptomatic but completely ambulatory  Blood pressure (!) 130/93, pulse 95, temperature 97.9 F (36.6 C), temperature source Oral, resp. rate 16, weight 202 lb 4.8 oz (91.8 kg), SpO2 97 %.  LABORATORY DATA: Lab Results  Component Value Date   WBC 5.3 07/10/2022   HGB 14.1 07/10/2022   HCT 42.2 07/10/2022   MCV 90.8 07/10/2022   PLT 302 07/10/2022      Chemistry      Component Value Date/Time   NA 138 06/26/2022 1055   NA 140 03/06/2022 1715   K 3.9 06/26/2022 1055   CL 106 06/26/2022 1055   CO2 26 06/26/2022 1055   BUN 16  06/26/2022 1055   BUN 29 (H) 03/06/2022 1715   CREATININE 0.93 06/26/2022 1055      Component Value Date/Time   CALCIUM 9.5 06/26/2022 1055   ALKPHOS 103 06/26/2022 1055   AST 13 (L) 06/26/2022 1055   ALT 18 06/26/2022 1055   BILITOT 0.8 06/26/2022 1055       RADIOGRAPHIC STUDIES: CT Chest W Contrast  Result Date: 07/02/2022 CLINICAL DATA:  Staging non-small-cell lung cancer. * Tracking Code: BO * EXAM: CT CHEST WITH CONTRAST TECHNIQUE: Multidetector CT imaging of the chest was performed during intravenous contrast administration. RADIATION DOSE REDUCTION: This exam was performed according to the departmental dose-optimization program which includes automated exposure control, adjustment of the mA and/or kV according to patient size and/or use of iterative reconstruction technique. CONTRAST:  17m OMNIPAQUE IOHEXOL 300 MG/ML  SOLN COMPARISON:  Chest CT angiogram 03/13/2022.  PET-CT 04/21/2022 FINDINGS: Cardiovascular: Heart is nonenlarged. Trace pericardial fluid. Coronary artery calcifications are seen. There is atherosclerotic plaque as well diffusely scattered along the thoracic aorta. Diameter of the ascending aorta at the level of  the right pulmonary artery measures 4.2 x 4.3 cm. Mediastinum/Nodes: Small cystic foci again noted along the thyroid gland. Normal caliber thoracic esophagus. No specific abnormal lymph node enlargement identified in the axillary region. There are several abnormal nodes once again seen in the mediastinum and right hilum. Subcarinal node which previously measured in November 2023 3.1 by 1.7 cm, today on series 2, image 79 3.0 x 1.9 cm. This has some small calcifications. On the PET-CT from December 2023 this would have measured 2.7 by 1.9 cm. Paratracheal node today on series 64 of series 2 which measures 2.1 x 1.7 cm, in November 2023 when it measured 17 x 1.1 cm. In December node would have measured 17 by 13 mm. Slightly larger today. Node upper right paratracheal  which on the prior measured 15 by 23 mm, today on image 50 of series 2 measures 20 x 13 mm. December 2023 this would have measured 21 x 13 mm. Similar. Abnormal right hilar lymph node also identified. Today on series 2, image 76 measures 4.4 by 2.5 cm and previously when measured in the same fashion would have measured 2.3 x 4.1 cm. Similar other hilar nodes are also noted and are relatively similar when adjusting for technique. The hilar nodes are more difficult to measure on the PET-CT scan. This is technical without IV contrast on the attenuation correction CT for that exam. Lungs/Pleura: Centrilobular emphysematous lung changes are identified. No pneumothorax or effusion. There is some left basilar atelectasis but improved from November 2023. Similar to the study of December 2023. there is developing confluence ill-defined opacity in the dependent right upper lobe with a nodular area on image 73 of series 5 measuring up to 3.0 by 1.7 cm. This would have a differential. There also is a 5 mm nodule which is new as well in the middle lobe on image 82. Persistent atelectasis or scar along the inferior middle lobe. Upper Abdomen: Adrenal glands are preserved in the upper abdomen. Multiple renal cystic foci as seen on prior examination. Musculoskeletal: Slight curvature of the spine. Mild degenerative changes. IMPRESSION: Multiple abnormal lymph nodes identified in the mediastinum and right hilum. Overall relatively similar when adjusting for technique compared to the previous examination. Developing nodular consolidative opacity in the dependent right upper lobe there has also a new 5 mm nodule in the middle lobe. This would have a differential including infectious, inflammatory or neoplasm. Recommend correlation to specific symptoms and short follow-up Aortic Atherosclerosis (ICD10-I70.0) and Emphysema (ICD10-J43.9). Electronically Signed   By: Jill Side M.D.   On: 07/02/2022 17:52   CUP PACEART REMOTE DEVICE  CHECK  Result Date: 06/27/2022 ILR summary report received. Battery status OK. Normal device function. No new symptom, tachy, brady, or pause episodes. No new AF episodes. Monthly summary reports and ROV/PRN LA  DG Knee 3 Views Left  Result Date: 06/20/2022 CLINICAL DATA:  Anterior knee pain.  No known injury. EXAM: LEFT KNEE - 3 VIEW COMPARISON:  None Available. FINDINGS: Mild tricompartmental joint space narrowing and peripheral osteophytosis, greatest within the lateral compartment. Moderate superior patellar degenerative osteophyte. No acute fracture or dislocation. Small joint effusion. Moderate vascular calcifications. IMPRESSION: Mild tricompartmental osteoarthritis, greatest within the lateral compartment. Electronically Signed   By: Yvonne Kendall M.D.   On: 06/20/2022 14:46   DG CHEST PORT 1 VIEW  Result Date: 06/12/2022 CLINICAL DATA:  Status post bronchoscopy. EXAM: PORTABLE CHEST 1 VIEW COMPARISON:  PET 04/21/2022, 03/13/2022 and CT chest 03/13/2022. FINDINGS: Trachea is midline. Heart  size is accentuated by technique. Loop recorder projects over the left chest. Mild bibasilar atelectasis. No airspace consolidation or definite pleural fluid. No pneumothorax. IMPRESSION: 1. No pneumothorax. 2. Mild bibasilar atelectasis. 3. Mediastinal and hilar adenopathy better seen on cross-sectional imaging. Electronically Signed   By: Lorin Picket M.D.   On: 06/12/2022 12:47    ASSESSMENT AND PLAN: This is a very pleasant 72 years old African-American male with  stage IIIb (T0 N2, M0) non-small cell lung cancer, adenocarcinoma presented with right hilar and mediastinal lymphadenopathy with no clear primary diagnosed in February 2024. His molecular studies by [email or web address] that showed positive KRAS G12C mutation and PD-L1 expression of 53%. The patient is currently undergoing a course of concurrent chemoradiation with weekly carboplatin for AUC of 2 and paclitaxel 45 Mg/M2 first dose July 10, 2022.  He  is expected to start the concurrent radiotherapy on July 13, 2022. I recommended for the patient to proceed with the first dose of his chemotherapy today as planned. I also discussed with him the results of the CT scan of the chest. I will see him back for follow-up visit in 2 weeks for evaluation before starting cycle #3. He was advised to call immediately if he has any concerning symptoms in the interval. The patient voices understanding of current disease status and treatment options and is in agreement with the current care plan.  All questions were answered. The patient knows to call the clinic with any problems, questions or concerns. We can certainly see the patient much sooner if necessary.  The total time spent in the appointment was 20 minutes.  Disclaimer: This note was dictated with voice recognition software. Similar sounding words can inadvertently be transcribed and may not be corrected upon review.

## 2022-07-10 NOTE — Patient Instructions (Addendum)
[facility]  Discharge Instructions: Thank you for choosing [facility] to provide your oncology and hematology care.   If you have a lab appointment with the [facility], please go directly to the [facility] and check in at the registration area.   Wear comfortable clothing and clothing appropriate for easy access to any Portacath or PICC line.   We strive to give you quality time with your provider. You may need to reschedule your appointment if you arrive late (15 or more minutes).  Arriving late affects you and other patients whose appointments are after yours.  Also, if you miss three or more appointments without notifying the office, you may be dismissed from the clinic at the provider's discretion.      For prescription refill requests, have your pharmacy contact our office and allow 72 hours for refills to be completed.    Today you received the following chemotherapy and/or immunotherapy agents      To help prevent nausea and vomiting after your treatment, we encourage you to take your nausea medication as directed.  BELOW ARE SYMPTOMS THAT SHOULD BE REPORTED IMMEDIATELY: *FEVER GREATER THAN 100.4 F (38 C) OR HIGHER *CHILLS OR SWEATING *NAUSEA AND VOMITING THAT IS NOT CONTROLLED WITH YOUR NAUSEA MEDICATION *UNUSUAL SHORTNESS OF BREATH *UNUSUAL BRUISING OR BLEEDING *URINARY PROBLEMS (pain or burning when urinating, or frequent urination) *BOWEL PROBLEMS (unusual diarrhea, constipation, pain near the anus) TENDERNESS IN MOUTH AND THROAT WITH OR WITHOUT PRESENCE OF ULCERS (sore throat, sores in mouth, or a toothache) UNUSUAL RASH, SWELLING OR PAIN  UNUSUAL VAGINAL DISCHARGE OR ITCHING   Items with * indicate a potential emergency and should be followed up as soon as possible or go to the Emergency Department if any problems should occur.  Please show the CHEMOTHERAPY ALERT CARD or IMMUNOTHERAPY ALERT CARD at check-in to the  Emergency Department and triage nurse.  Should you have questions after your visit or need to cancel or reschedule your appointment, please contact [facility]  Dept: [phone]  and follow the prompts.  Office hours are 8:00 a.m. to 4:30 p.m. Monday - Friday. Please note that voicemails left after 4:00 p.m. may not be returned until the following business day.  We are closed weekends and major holidays. You have access to a nurse at all times for urgent questions. Please call the main number to the clinic Dept: [phone] and follow the prompts.   For any non-urgent questions, you may also contact your provider using MyChart. We now offer e-Visits for anyone 5 and older to request care online for non-urgent symptoms. For details visit [email or web address].   Also download the MyChart app! Go to the app store, search "MyChart", open the app, select [facility], and log in with your MyChart username and password.  [facility]  Discharge Instructions: Thank you for choosing [facility] to provide your oncology and hematology care.   If you have a lab appointment with the [facility], please go directly to the [facility] and check in at the registration area.   Wear comfortable clothing and clothing appropriate for easy access to any Portacath or PICC line.   We strive to give you quality time with your provider. You may need to reschedule your appointment if you arrive late (15 or more minutes).  Arriving late affects you and other patients whose appointments are  after yours.  Also, if you miss three or more appointments without notifying the office, you may be dismissed from the clinic at the provider's discretion.      For prescription refill requests, have your pharmacy contact our office and allow 72 hours for refills to be completed.    Today you received the following chemotherapy and/or  immunotherapy agents Paclitaxel and Carboplatin.    To help prevent nausea and vomiting after your treatment, we encourage you to take your nausea medication as directed.  BELOW ARE SYMPTOMS THAT SHOULD BE REPORTED IMMEDIATELY: *FEVER GREATER THAN 100.4 F (38 C) OR HIGHER *CHILLS OR SWEATING *NAUSEA AND VOMITING THAT IS NOT CONTROLLED WITH YOUR NAUSEA MEDICATION *UNUSUAL SHORTNESS OF BREATH *UNUSUAL BRUISING OR BLEEDING *URINARY PROBLEMS (pain or burning when urinating, or frequent urination) *BOWEL PROBLEMS (unusual diarrhea, constipation, pain near the anus) TENDERNESS IN MOUTH AND THROAT WITH OR WITHOUT PRESENCE OF ULCERS (sore throat, sores in mouth, or a toothache) UNUSUAL RASH, SWELLING OR PAIN  UNUSUAL VAGINAL DISCHARGE OR ITCHING   Items with * indicate a potential emergency and should be followed up as soon as possible or go to the Emergency Department if any problems should occur.  Please show the CHEMOTHERAPY ALERT CARD or IMMUNOTHERAPY ALERT CARD at check-in to the Emergency Department and triage nurse.  Should you have questions after your visit or need to cancel or reschedule your appointment, please contact [facility]  Dept: [phone]  and follow the prompts.  Office hours are 8:00 a.m. to 4:30 p.m. Monday - Friday. Please note that voicemails left after 4:00 p.m. may not be returned until the following business day.  We are closed weekends and major holidays. You have access to a nurse at all times for urgent questions. Please call the main number to the clinic Dept: [phone] and follow the prompts.   For any non-urgent questions, you may also contact your provider using MyChart. We now offer e-Visits for anyone 41 and older to request care online for non-urgent symptoms. For details visit [email or web address].   Also download the MyChart app! Go to the app store, search "MyChart", open the app, select , and log in  with your MyChart username and password.

## 2022-07-11 ENCOUNTER — Ambulatory Visit (HOSPITAL_COMMUNITY): Payer: Medicare HMO

## 2022-07-11 ENCOUNTER — Telehealth: Payer: Self-pay

## 2022-07-11 DIAGNOSIS — E119 Type 2 diabetes mellitus without complications: Secondary | ICD-10-CM | POA: Diagnosis not present

## 2022-07-11 DIAGNOSIS — J439 Emphysema, unspecified: Secondary | ICD-10-CM | POA: Diagnosis not present

## 2022-07-11 DIAGNOSIS — M25562 Pain in left knee: Secondary | ICD-10-CM | POA: Diagnosis not present

## 2022-07-11 DIAGNOSIS — E7849 Other hyperlipidemia: Secondary | ICD-10-CM | POA: Diagnosis not present

## 2022-07-11 DIAGNOSIS — I10 Essential (primary) hypertension: Secondary | ICD-10-CM | POA: Diagnosis not present

## 2022-07-11 DIAGNOSIS — Z7984 Long term (current) use of oral hypoglycemic drugs: Secondary | ICD-10-CM | POA: Diagnosis not present

## 2022-07-11 DIAGNOSIS — I7121 Aneurysm of the ascending aorta, without rupture: Secondary | ICD-10-CM | POA: Diagnosis not present

## 2022-07-11 DIAGNOSIS — C3490 Malignant neoplasm of unspecified part of unspecified bronchus or lung: Secondary | ICD-10-CM | POA: Diagnosis not present

## 2022-07-11 DIAGNOSIS — S81801D Unspecified open wound, right lower leg, subsequent encounter: Secondary | ICD-10-CM | POA: Diagnosis not present

## 2022-07-11 NOTE — Telephone Encounter (Signed)
-----   Message from Manning Charity, RN sent at 07/10/2022 12:54 PM EDT ----- Regarding: First time Taxol/Carbo Mohamed Pt Please Call for Follow up 07/11/2022

## 2022-07-11 NOTE — Telephone Encounter (Signed)
Mr. Ouderkirk states that he is doing fine. He is eating, drinking, and urinating well. He knows to call the office at [phone] if he has any questions or concerns.

## 2022-07-12 ENCOUNTER — Encounter: Payer: Self-pay | Admitting: Cardiovascular Disease

## 2022-07-12 LAB — FUNGUS CULTURE WITH STAIN

## 2022-07-12 LAB — FUNGAL ORGANISM REFLEX

## 2022-07-12 LAB — FUNGUS CULTURE RESULT

## 2022-07-12 NOTE — Progress Notes (Signed)
TO BE COMPLETED BY RADIATION ONCOLOGIST OFFICE:   Patient Name: Gavin Dennis   Date of Birth: Mar 20, 1951   Radiation Oncologist: Dr. Gery Pray   Site to be Treated: Right Lung   Will x-rays >10 MV be used? No   Will the radiation be >10 cm from the device? Yes   Planned Treatment Start Date: 07/13/22   TO BE COMPLETED BY CARDIOLOGIST OFFICE:   Device Information:  Pacemaker '[]'$      ICD '[]'$  Pt has Loop Recorder , device does not provide any kind of therapies only monitors heart rhythm  Brand: [employer]: [phone] Model #: [id number]  Serial Number: [id number]     Date of Placement: 03/23/22  Site of Placement: left parasternal region over the 4th intercostal space.   Remote Device Check--Frequency: 31 days   Last Check: 06/25/22  Is the Patient Pacer Dependent?:  Yes '[]'$   No '[]'$   Does cardiologist request Radiation Oncology to schedule device testing by vendor for the following:  Prior to the Initiation of Treatments?  Yes '[]'$  No '[x]'$  During Treatments?  Yes '[]'$  No '[x]'$  Post Radiation Treatments?  Yes '[]'$  No '[x]'$   Is device monitoring necessary by vendor/cardiologist team during treatments?  Yes '[]'$   No '[x]'$   Is cardiac monitoring by Radiation Oncology nursing necessary during treatments? Yes '[x]'$   No '[]'$   Do you recommend device be relocated prior to Radiation Treatment? Yes '[]'$   No '[x]'$   **PLEASE LIST ANY NOTES OR SPECIAL REQUESTS:       CARDIOLOGIST SIGNATURE:  Dr. Myles Gip Per [facility] Standing Orders, Wanda Plump  07/12/2022 9:32 AM  **Please route completed form back to Radiation Oncology Nursing and "[facility]", OR send an update if there will be a delay in having form completed by expected start date.  **Call [phone] if you have any questions or do not get an in-basket response from a Radiation Oncology staff member

## 2022-07-13 ENCOUNTER — Ambulatory Visit: Admission: RE | Admit: 2022-07-13 | Payer: Medicare HMO | Source: Ambulatory Visit

## 2022-07-13 ENCOUNTER — Encounter (HOSPITAL_BASED_OUTPATIENT_CLINIC_OR_DEPARTMENT_OTHER): Payer: Medicare HMO | Attending: Internal Medicine | Admitting: Internal Medicine

## 2022-07-13 DIAGNOSIS — Z87891 Personal history of nicotine dependence: Secondary | ICD-10-CM | POA: Insufficient documentation

## 2022-07-13 DIAGNOSIS — E7800 Pure hypercholesterolemia, unspecified: Secondary | ICD-10-CM | POA: Diagnosis not present

## 2022-07-13 DIAGNOSIS — I119 Hypertensive heart disease without heart failure: Secondary | ICD-10-CM | POA: Diagnosis not present

## 2022-07-13 DIAGNOSIS — C781 Secondary malignant neoplasm of mediastinum: Secondary | ICD-10-CM | POA: Diagnosis not present

## 2022-07-13 DIAGNOSIS — Z8673 Personal history of transient ischemic attack (TIA), and cerebral infarction without residual deficits: Secondary | ICD-10-CM | POA: Diagnosis not present

## 2022-07-13 DIAGNOSIS — C801 Malignant (primary) neoplasm, unspecified: Secondary | ICD-10-CM | POA: Diagnosis not present

## 2022-07-13 DIAGNOSIS — Z5111 Encounter for antineoplastic chemotherapy: Secondary | ICD-10-CM | POA: Diagnosis not present

## 2022-07-13 DIAGNOSIS — C771 Secondary and unspecified malignant neoplasm of intrathoracic lymph nodes: Secondary | ICD-10-CM | POA: Diagnosis not present

## 2022-07-13 DIAGNOSIS — I890 Lymphedema, not elsewhere classified: Secondary | ICD-10-CM | POA: Diagnosis not present

## 2022-07-13 DIAGNOSIS — L97812 Non-pressure chronic ulcer of other part of right lower leg with fat layer exposed: Secondary | ICD-10-CM | POA: Insufficient documentation

## 2022-07-13 DIAGNOSIS — L089 Local infection of the skin and subcutaneous tissue, unspecified: Secondary | ICD-10-CM | POA: Diagnosis not present

## 2022-07-13 DIAGNOSIS — I87311 Chronic venous hypertension (idiopathic) with ulcer of right lower extremity: Secondary | ICD-10-CM | POA: Diagnosis not present

## 2022-07-13 DIAGNOSIS — M1712 Unilateral primary osteoarthritis, left knee: Secondary | ICD-10-CM | POA: Diagnosis not present

## 2022-07-13 DIAGNOSIS — I700 Atherosclerosis of aorta: Secondary | ICD-10-CM | POA: Diagnosis not present

## 2022-07-13 DIAGNOSIS — I69398 Other sequelae of cerebral infarction: Secondary | ICD-10-CM | POA: Diagnosis not present

## 2022-07-13 DIAGNOSIS — H547 Unspecified visual loss: Secondary | ICD-10-CM | POA: Diagnosis not present

## 2022-07-13 DIAGNOSIS — E11622 Type 2 diabetes mellitus with other skin ulcer: Secondary | ICD-10-CM | POA: Diagnosis not present

## 2022-07-13 DIAGNOSIS — I10 Essential (primary) hypertension: Secondary | ICD-10-CM | POA: Insufficient documentation

## 2022-07-13 DIAGNOSIS — C3490 Malignant neoplasm of unspecified part of unspecified bronchus or lung: Secondary | ICD-10-CM | POA: Diagnosis not present

## 2022-07-13 DIAGNOSIS — E1169 Type 2 diabetes mellitus with other specified complication: Secondary | ICD-10-CM | POA: Diagnosis not present

## 2022-07-13 DIAGNOSIS — J439 Emphysema, unspecified: Secondary | ICD-10-CM | POA: Diagnosis not present

## 2022-07-13 DIAGNOSIS — Z510 Encounter for antineoplastic radiation therapy: Secondary | ICD-10-CM | POA: Diagnosis not present

## 2022-07-14 ENCOUNTER — Ambulatory Visit: Payer: Medicare HMO

## 2022-07-14 MED FILL — Dexamethasone Sodium Phosphate Inj 100 MG/10ML: INTRAMUSCULAR | Qty: 1 | Status: AC

## 2022-07-14 NOTE — Progress Notes (Signed)
Navigator called patient to see how his first treatment went. Pt endorses feeling well, having lots of energy. Pt starts daily radiation next week. Pt aware that he may have less energy when radiation starts, but navigator encouraged the pt to stay as active as he can to help counteract the fatigue that may occur. Pt states he will continue to try to stay active. Pt denies questions or concerns at this time. Navigator will follow up with the pt at his next appt with provider on 3/25.

## 2022-07-14 NOTE — Progress Notes (Addendum)
FERRY, HOTTLE ([id number]) [id number] Page 1 of 6 Visit Report for 07/13/2022 Chief Complaint Document Details Patient Name: Date of Service: Gavin Dennis, Gavin Dennis 07/13/2022 10:15 A M Medical Record Number: [id number] Patient Account Number: [id number] Date of Birth/Sex: Treating RN: 11/02/1950 (72 y.o. M) Primary Care Provider: Early Osmond Other Clinician: Referring Provider: Treating Provider/Extender: Young Berry, Rinka Weeks in Treatment: 4 Information Obtained from: Patient Chief Complaint 06/15/2022; right lower extremity wounds Electronic Signature(s) Signed: 07/13/2022 3:34:03 PM By: Kalman Shan DO Entered By: Kalman Shan on 07/13/2022 10:59:10 -------------------------------------------------------------------------------- HPI Details Patient Name: Date of Service: Gavin Dennis, Gavin Dennis 07/13/2022 10:15 A M Medical Record Number: [id number] Patient Account Number: [id number] Date of Birth/Sex: Treating RN: Jun 02, 1950 (72 y.o. M) Primary Care Provider: Early Osmond Other Clinician: Referring Provider: Treating Provider/Extender: Young Berry, Rinka Weeks in Treatment: 4 History of Present Illness HPI Description: 06/15/2022 Gavin Dennis is a 72 year old male with a past medical history of lymphedema, CVA, hypertension and controlled type 2 diabetes that presents to the clinic for a 57-month history of waxing and waning of wounds to his right lower extremity that happened spontaneously. He has been keeping the wound areas covered. He does not wear compression stockings. He denies signs of infection. 2/23; patient presents for follow-up. We have been using antibiotic ointment with Hydrofera Blue under 3 layer compression. He tolerated the wrap well. There has been improvement in wound healing. 3/14; patient presents for follow-up. We have been using antibiotic ointment with PolyMem under 4-layer compression. His wound is  healed. He has compression stockings at home. Electronic Signature(s) Signed: 07/13/2022 3:34:03 PM By: Kalman Shan DO Entered By: Kalman Shan on 07/13/2022 10:59:32 -------------------------------------------------------------------------------- Physical Exam Details Patient Name: Date of Service: Gavin Dennis 07/13/2022 10:15 A M Medical Record Number: [id number] Patient Account Number: [id number] Date of Birth/Sex: Treating RN: [birth year] (72 y.o. M) Primary Care Provider: Early Osmond Other Clinician: Referring Provider: Treating Provider/Extender: Young Berry, Rinka Weeks in Treatment: 4 Constitutional respirations regular, non-labored and within target range for patient.. Cardiovascular 2+ dorsalis pedis/posterior tibialis pulses. Psychiatric NIKODEM, SCHWANKE ([id number]) [id number] Page 2 of 6 pleasant and cooperative. Notes Right lower extremity: Epithelization to the previous wound sites. Edema control. No signs of surrounding infection. Electronic Signature(s) Signed: 07/13/2022 3:34:03 PM By: Kalman Shan DO Entered By: Kalman Shan on 07/13/2022 11:00:16 -------------------------------------------------------------------------------- Physician Orders Details Patient Name: Date of Service: Gavin Dennis 07/13/2022 10:15 A M Medical Record Number: [id number] Patient Account Number: [id number] Date of Birth/Sex: Treating RN: [birth year] (72 y.o. Burnadette Pop, Lauren Primary Care Provider: Early Osmond Other Clinician: Referring Provider: Treating Provider/Extender: Kendrick Fries in Treatment: 4 Verbal / Phone Orders: No Diagnosis Coding Discharge From [facility] Services Discharge from [place] Signature(s) Signed: 07/13/2022 3:34:03 PM By: Kalman Shan DO Entered By: Kalman Shan on 07/13/2022  11:00:23 -------------------------------------------------------------------------------- Problem List Details Patient Name: Date of Service: Gavin Dennis, Gavin Dennis 07/13/2022 10:15 A M Medical Record Number: [id number] Patient Account Number: [id number] Date of Birth/Sex: Treating RN: [birth year] (72 y.o. M) Primary Care Provider: Early Osmond Other Clinician: Referring Provider: Treating Provider/Extender: Young Berry, Rinka Weeks in Treatment: 4 Active Problems ICD-10 Encounter Code Description Active Date MDM Diagnosis I87.311 Chronic venous hypertension (idiopathic) with ulcer of right lower extremity 06/15/2022 No Yes L97.812 Non-pressure chronic ulcer of other part of right lower leg with fat layer 06/15/2022 No Yes exposed I89.0 Lymphedema, not elsewhere classified 06/15/2022 No Yes E11.622 Type 2 diabetes mellitus with other skin ulcer  06/15/2022 No Yes Inactive Problems Resolved Problems Electronic Signature(s) Signed: 07/13/2022 3:34:03 PM By: Dagoberto Reef, Signed: 07/13/2022 3:34:03 PM By: Shelby Mattocks ([id number]) [id number] Page 3 of 6 Entered By: Kalman Shan on 07/13/2022 10:58:56 -------------------------------------------------------------------------------- Progress Note Details Patient Name: Date of Service: Gavin Dennis, Gavin Dennis 07/13/2022 10:15 A M Medical Record Number: [id number] Patient Account Number: [id number] Date of Birth/Sex: Treating RN: 07-Sep-1950 (72 y.o. M) Primary Care Provider: Early Osmond Other Clinician: Referring Provider: Treating Provider/Extender: Young Berry, Rinka Weeks in Treatment: 4 Subjective Chief Complaint Information obtained from Patient 06/15/2022; right lower extremity wounds History of Present Illness (HPI) 06/15/2022 Gavin Dennis is a 72 year old male with a past medical history of lymphedema, CVA, hypertension and controlled type 2 diabetes that  presents to the clinic for a 44-month history of waxing and waning of wounds to his right lower extremity that happened spontaneously. He has been keeping the wound areas covered. He does not wear compression stockings. He denies signs of infection. 2/23; patient presents for follow-up. We have been using antibiotic ointment with Hydrofera Blue under 3 layer compression. He tolerated the wrap well. There has been improvement in wound healing. 3/14; patient presents for follow-up. We have been using antibiotic ointment with PolyMem under 4-layer compression. His wound is healed. He has compression stockings at home. Patient History Information obtained from Patient, Chart. Family History Unknown History. Social History Former smoker - ended on 05/02/2019, Marital Status - Single, Alcohol Use - Never, Drug Use - No History, Caffeine Use - Rarely. Medical History Cardiovascular Patient has history of Hypertension Endocrine Patient has history of Type II Diabetes Hospitalization/Surgery History - stroke 2022,2023. Medical A Surgical History Notes nd Constitutional Symptoms (General Health) stroke 2022, 2023 Respiratory pulmonary nodules mediastinal lymphadenopathy Cardiovascular Hyperlipidemia thoracic aortic aneurysm Musculoskeletal polio (in childhood) Neurologic Degenerative Disc Disease, CVA Objective Constitutional respirations regular, non-labored and within target range for patient.. Vitals Time Taken: 10:44 AM, Height: 71 in, Weight: 219 lbs, BMI: 30.5, Temperature: 97.9 F, Pulse: 76 bpm, Respiratory Rate: 20 breaths/min, Blood Pressure: 149/92 mmHg. Cardiovascular 2+ dorsalis pedis/posterior tibialis pulses. Psychiatric COLIN, BEHRLE ([id number]) [id number] Page 4 of 6 pleasant and cooperative. General Notes: Right lower extremity: Epithelization to the previous wound sites. Edema control. No signs of surrounding infection. Integumentary  (Hair, Skin) Wound #1 status is Healed - Epithelialized. Original cause of wound was Blister. The date acquired was: 04/11/2022. The wound has been in treatment 4 weeks. The wound is located on the Right,Anterior Lower Leg. The wound measures 0cm length x 0cm width x 0cm depth; 0cm^2 area and 0cm^3 volume. There is Fat Layer (Subcutaneous Tissue) exposed. There is a medium amount of serosanguineous drainage noted. The wound margin is distinct with the outline attached to the wound base. There is large (67-100%) red, pink granulation within the wound bed. There is a small (1-33%) amount of necrotic tissue within the wound bed. The periwound skin appearance exhibited: Scarring, Hemosiderin Staining. The periwound skin appearance did not exhibit: Callus, Crepitus, Excoriation, Induration, Rash, Dry/Scaly, Maceration, Atrophie Blanche, Cyanosis, Ecchymosis, Mottled, Pallor, Rubor, Erythema. Assessment Active Problems ICD-10 Chronic venous hypertension (idiopathic) with ulcer of right lower extremity Non-pressure chronic ulcer of other part of right lower leg with fat layer exposed Lymphedema, not elsewhere classified Type 2 diabetes mellitus with other skin ulcer Patient has done well with PolyMem under compression therapy. His wound is healed. I recommended wearing compression stockings daily. Follow-up as needed. Plan Discharge From [facility] Services: Discharge from  [place] 1. Compression stockings daily 2. Discharge from clinic due to closed wound 3. Follow-up as needed Electronic Signature(s) Signed: 07/13/2022 3:34:03 PM By: Kalman Shan DO Entered By: Kalman Shan on 07/13/2022 11:01:23 -------------------------------------------------------------------------------- HxROS Details Patient Name: Date of Service: Gavin Dennis, Gavin Dennis 07/13/2022 10:15 A M Medical Record Number: [id number] Patient Account Number: [id number] Date of Birth/Sex: Treating RN: 1950/05/10 (72 y.o.  M) Primary Care Provider: Early Osmond Other Clinician: Referring Provider: Treating Provider/Extender: Young Berry, Rinka Weeks in Treatment: 4 Information Obtained From Patient Chart Constitutional Symptoms ([place]) Medical History: Past Medical History Notes: stroke 2022, 2023 Respiratory Medical History: Past Medical History Notes: pulmonary nodules mediastinal lymphadenopathy Cardiovascular Gavin Dennis, Gavin ([id number]) [id number] Page 5 of 6 Medical History: Positive for: Hypertension Past Medical History Notes: Hyperlipidemia thoracic aortic aneurysm Endocrine Medical History: Positive for: Type II Diabetes Time with diabetes: 1 year Treated with: Oral agents Blood sugar tested every day: No Musculoskeletal Medical History: Past Medical History Notes: polio (in childhood) Neurologic Medical History: Past Medical History Notes: Degenerative Disc Disease, CVA Immunizations Pneumococcal Vaccine: Received Pneumococcal Vaccination: Yes Received Pneumococcal Vaccination On or After 60th Birthday: Yes Implantable Devices None Hospitalization / Surgery History Type of Hospitalization/Surgery stroke 2022,2023 Family and Social History Unknown History: Yes; Former smoker - ended on 05/02/2019; Marital Status - Single; Alcohol Use: Never; Drug Use: No History; Caffeine Use: Rarely; Financial Concerns: No; Food, Clothing or Shelter Needs: No; Support System Lacking: No; Transportation Concerns: No Electronic Signature(s) Signed: 07/13/2022 3:34:03 PM By: Kalman Shan DO Entered By: Kalman Shan on 07/13/2022 10:59:45 -------------------------------------------------------------------------------- SuperBill Details Patient Name: Date of Service: Gavin Dennis, Gavin Dennis 07/13/2022 Medical Record Number: [id number] Patient Account Number: [id number] Date of Birth/Sex: Treating RN: 1951-03-11 (72 y.o. M) Primary Care  Provider: Early Osmond Other Clinician: Referring Provider: Treating Provider/Extender: Young Berry, Rinka Weeks in Treatment: 4 Diagnosis Coding ICD-10 Codes Code Description I87.311 Chronic venous hypertension (idiopathic) with ulcer of right lower extremity L97.812 Non-pressure chronic ulcer of other part of right lower leg with fat layer exposed I89.0 Lymphedema, not elsewhere classified E11.622 Type 2 diabetes mellitus with other skin ulcer Facility Procedures Physician Procedures : CPT4 Code Description Modifier [id number] - WC PHYS LEVEL 3 - EST PT ICD-10 Diagnosis Description I87.311 Chronic venous hypertension (idiopathic) with ulcer of right lower extremity L97.812 Non-pressure chronic ulcer of other part of right lower  leg with fat layer exposed I89.0 Lymphedema, not elsewhere classified E11.622 Type 2 diabetes mellitus with other skin ulcer Quantity: 1 Electronic Signature(s) Signed: 07/25/2022 10:35:32 AM By: Kalman Shan DO Signed: 08/02/2022 4:26:45 PM By: Rhae Hammock RN Previous Signature: 07/13/2022 3:34:03 PM Version By: Kalman Shan DO Entered By: Rhae Hammock on 07/24/2022 15:25:08

## 2022-07-14 NOTE — Progress Notes (Addendum)
Gavin Dennis, Gavin Dennis ([id number]) [id number] Page 1 of 7 Visit Report for 07/13/2022 Arrival Information Details Patient Name: Date of Service: Gavin Dennis, Gavin Dennis 07/13/2022 10:15 A M Medical Record Number: [id number] Patient Account Number: [id number] Date of Birth/Sex: Treating RN: 1950-05-05 (72 y.o. M) Primary Care Kylyn Sookram: Early Osmond Other Clinician: Referring Mikia Delaluz: Treating Katieann Hungate/Extender: Young Berry, Rinka Weeks in Treatment: 4 Visit Information History Since Last Visit All ordered tests and consults were completed: No Patient Arrived: Ambulatory Added or deleted any medications: No Arrival Time: 10:43 Any new allergies or adverse reactions: No Accompanied By: self Had a fall or experienced change in No Transfer Assistance: None activities of daily living that may affect Patient Identification Verified: Yes risk of falls: Secondary Verification Process Completed: Yes Signs or symptoms of abuse/neglect since last visito No Patient Requires Transmission-Based Precautions: No Hospitalized since last visit: No Patient Has Alerts: No Implantable device outside of the clinic excluding No cellular tissue based products placed in the center since last visit: Pain Present Now: No Electronic Signature(s) Signed: 07/13/2022 12:51:31 PM By: Worthy Rancher Entered By: Worthy Rancher on 07/13/2022 10:44:04 -------------------------------------------------------------------------------- Clinic Level of Care Assessment Details Patient Name: Date of Service: Gavin Dennis, Gavin Dennis 07/13/2022 10:15 A M Medical Record Number: [id number] Patient Account Number: [id number] Date of Birth/Sex: Treating RN: 12/29/1950 (72 y.o. Burnadette Pop, Lauren Primary Care Zyliah Schier: Early Osmond Other Clinician: Referring Thy Gullikson: Treating Willow Shidler/Extender: Young Berry, Rinka Weeks in Treatment: 4 Clinic Level of Care Assessment Items TOOL 4 Quantity Score X-  1 0 Use when only an EandM is performed on FOLLOW-UP visit ASSESSMENTS - Nursing Assessment / Reassessment X- 1 10 Reassessment of Co-morbidities (includes updates in patient status) X- 1 5 Reassessment of Adherence to Treatment Plan ASSESSMENTS - Wound and Skin A ssessment / Reassessment X - Simple Wound Assessment / Reassessment - one wound 1 5 []  - 0 Complex Wound Assessment / Reassessment - multiple wounds []  - 0 Dermatologic / Skin Assessment (not related to wound area) ASSESSMENTS - Focused Assessment X- 1 5 Circumferential Edema Measurements - multi extremities []  - 0 Nutritional Assessment / Counseling / Intervention []  - 0 Lower Extremity Assessment (monofilament, tuning fork, pulses) []  - 0 Peripheral Arterial Disease Assessment (using hand held doppler) ASSESSMENTS - Ostomy and/or Continence Assessment and Care []  - 0 Incontinence Assessment and Management []  - 0 Ostomy Care Assessment and Management (repouching, etc.) PROCESS - Coordination of Care X - Simple Patient / Family Education for ongoing care 1 15 Equality, Hi-Nella ([id number]) [id number] Page 2 of 7 []  - 0 Complex (extensive) Patient / Family Education for ongoing care X- 1 10 Staff obtains Programmer, systems, Records, T Results / Process Orders est []  - 0 Staff telephones HHA, Nursing Homes / Clarify orders / etc []  - 0 Routine Transfer to another Facility (non-emergent condition) []  - 0 Routine Hospital Admission (non-emergent condition) []  - 0 New Admissions / Biomedical engineer / Ordering NPWT Apligraf, etc. , []  - 0 Emergency Hospital Admission (emergent condition) X- 1 10 Simple Discharge Coordination []  - 0 Complex (extensive) Discharge Coordination PROCESS - Special Needs []  - 0 Pediatric / Minor Patient Management []  - 0 Isolation Patient Management []  - 0 Hearing / Language / Visual special needs []  - 0 Assessment of Community assistance (transportation, D/C  planning, etc.) []  - 0 Additional assistance / Altered mentation []  - 0 Support Surface(s) Assessment (bed, cushion, seat, etc.) INTERVENTIONS - Wound Cleansing / Measurement X - Simple Wound Cleansing - one wound 1 5 []  -  0 Complex Wound Cleansing - multiple wounds X- 1 5 Wound Imaging (photographs - any number of wounds) []  - 0 Wound Tracing (instead of photographs) X- 1 5 Simple Wound Measurement - one wound []  - 0 Complex Wound Measurement - multiple wounds INTERVENTIONS - Wound Dressings X - Small Wound Dressing one or multiple wounds 1 10 []  - 0 Medium Wound Dressing one or multiple wounds []  - 0 Large Wound Dressing one or multiple wounds []  - 0 Application of Medications - topical []  - 0 Application of Medications - injection INTERVENTIONS - Miscellaneous []  - 0 External ear exam []  - 0 Specimen Collection (cultures, biopsies, blood, body fluids, etc.) []  - 0 Specimen(s) / Culture(s) sent or taken to Lab for analysis []  - 0 Patient Transfer (multiple staff / Harrel Lemon Lift / Similar devices) []  - 0 Simple Staple / Suture removal (25 or less) []  - 0 Complex Staple / Suture removal (26 or more) []  - 0 Hypo / Hyperglycemic Management (close monitor of Blood Glucose) []  - 0 Ankle / Brachial Index (ABI) - do not check if billed separately X- 1 5 Vital Signs Has the patient been seen at the hospital within the last three years: Yes Total Score: 90 Level Of Care: New/Established - Level 3 Electronic Signature(s) Signed: 08/02/2022 4:26:45 PM By: Rhae Hammock RN Entered By: Rhae Hammock on 07/24/2022 15:24:52 [name] ([id number]) [id number] Page 3 of 7 -------------------------------------------------------------------------------- Encounter Discharge Information Details Patient Name: Date of Service: Gavin Dennis, Gavin Dennis 07/13/2022 10:15 A M Medical Record Number: [id number] Patient Account Number: [id number] Date of Birth/Sex:  Treating RN: 1950-08-19 (72 y.o. Burnadette Pop, Lauren Primary Care Iliani Vejar: Early Osmond Other Clinician: Referring Railyn House: Treating Javeon Macmurray/Extender: Kendrick Fries in Treatment: 4 Encounter Discharge Information Items Discharge Condition: Stable Ambulatory Status: Ambulatory Discharge Destination: Home Transportation: Private Auto Accompanied By: self Schedule Follow-up Appointment: Yes Clinical Summary of Care: Patient Declined Electronic Signature(s) Signed: 08/02/2022 4:26:45 PM By: Rhae Hammock RN Entered By: Rhae Hammock on 07/24/2022 15:25:36 -------------------------------------------------------------------------------- Lower Extremity Assessment Details Patient Name: Date of Service: Gavin Dennis, Gavin Dennis 07/13/2022 10:15 A M Medical Record Number: [id number] Patient Account Number: [id number] Date of Birth/Sex: Treating RN: [birth year] (72 y.o. Burnadette Pop, Lauren Primary Care Vincy Feliz: Early Osmond Other Clinician: Referring Jahmez Bily: Treating Pheobe Sandiford/Extender: Young Berry, Rinka Weeks in Treatment: 4 Edema Assessment Assessed: [Left: No] [Right: Yes] Edema: [Left: Ye] [Right: s] Calf Left: Right: Point of Measurement: 42 cm From Medial Instep 44 cm Ankle Left: Right: Point of Measurement: 14 cm From Medial Instep 24 cm Vascular Assessment Pulses: Dorsalis Pedis Palpable: [Right:Yes] Posterior Tibial Palpable: [Right:Yes] Electronic Signature(s) Signed: 07/14/2022 10:47:35 AM By: Rhae Hammock RN Entered By: Rhae Hammock on 07/13/2022 10:51:58 -------------------------------------------------------------------------------- Multi Wound Chart Details Patient Name: Date of Service: Gavin Dennis, Gavin Dennis 07/13/2022 10:15 A M Medical Record Number: [id number] Patient Account Number: [id number] Date of Birth/Sex: Treating RN: [birth year] (72 y.o. M) Primary Care Maan Zarcone: Early Osmond Other Clinician: [name] ([id number]) [id number] Page 4 of 7 Referring Tajana Crotteau: Treating Kimley Apsey/Extender: Young Berry, Rinka Weeks in Treatment: 4 Vital Signs Height(in): 71 Pulse(bpm): 76 Weight(lbs): 219 Blood Pressure(mmHg): 149/92 Body Mass Index(BMI): 30.5 Temperature(F): 97.9 Respiratory Rate(breaths/min): 20 [1:Photos:] [N/A:N/A] Right, Anterior Lower Leg N/A N/A Wound Location: Blister N/A N/A Wounding Event: Venous Leg Ulcer N/A N/A Primary Etiology: Diabetic Wound/Ulcer of the Lower N/A N/A Secondary Etiology: Extremity Hypertension, Type II Diabetes N/A N/A Comorbid History: 04/11/2022 N/A N/A Date Acquired: 4 N/A N/A Weeks of Treatment:  Healed - Epithelialized N/A N/A Wound Status: No N/A N/A Wound Recurrence: Yes N/A N/A Clustered Wound: 1 N/A N/A Clustered Quantity: 0x0x0 N/A N/A Measurements L x W x D (cm) 0 N/A N/A A (cm) : rea 0 N/A N/A Volume (cm) : 100.00% N/A N/A % Reduction in Area: 100.00% N/A N/A % Reduction in Volume: Full Thickness Without Exposed N/A N/A Classification: Support Structures Medium N/A N/A Exudate Amount: Serosanguineous N/A N/A Exudate Type: red, brown N/A N/A Exudate Color: Distinct, outline attached N/A N/A Wound Margin: Large (67-100%) N/A N/A Granulation Amount: Red, Pink N/A N/A Granulation Quality: Small (1-33%) N/A N/A Necrotic Amount: Fat Layer (Subcutaneous Tissue): Yes N/A N/A Exposed Structures: Fascia: No Tendon: No Muscle: No Joint: No Bone: No Large (67-100%) N/A N/A Epithelialization: Scarring: Yes N/A N/A Periwound Skin Texture: Excoriation: No Induration: No Callus: No Crepitus: No Rash: No Maceration: No N/A N/A Periwound Skin Moisture: Dry/Scaly: No Hemosiderin Staining: Yes N/A N/A Periwound Skin Color: Atrophie Blanche: No Cyanosis: No Ecchymosis: No Erythema: No Mottled: No Pallor: No Rubor: No Treatment Notes Electronic  Signature(s) Signed: 07/13/2022 3:34:03 PM By: Kalman Shan DO Entered By: Kalman Shan on 07/13/2022 10:59:00 [name] ([id number] Page 5 of 7 -------------------------------------------------------------------------------- Multi-Disciplinary Care Plan Details Patient Name: Date of Service: Gavin Dennis, Gavin Dennis 07/13/2022 10:15 A M Medical Record Number: [id number] Patient Account Number: [id number] Date of Birth/Sex: Treating RN: [birth year] (72 y.o. Burnadette Pop, Lauren Primary Care Michaiah Maiden: Early Osmond Other Clinician: Referring Husayn Reim: Treating Ferrell Flam/Extender: Young Berry, Rinka Weeks in Treatment: 4 Active Inactive Electronic Signature(s) Signed: 07/14/2022 10:47:35 AM By: Rhae Hammock RN Entered By: Rhae Hammock on 07/13/2022 10:53:06 -------------------------------------------------------------------------------- Pain Assessment Details Patient Name: Date of Service: Gavin Dennis, Gavin Dennis 07/13/2022 10:15 A M Medical Record Number: [id number] Patient Account Number: [id number] Date of Birth/Sex: Treating RN: 29-Oct-1950 (72 y.o. M) Primary Care Olanna Percifield: Early Osmond Other Clinician: Referring Corley Kohls: Treating Naiah Donahoe/Extender: Young Berry, Rinka Weeks in Treatment: 4 Active Problems Location of Pain Severity and Description of Pain Patient Has Paino No Site Locations Pain Management and Medication Current Pain Management: Electronic Signature(s) Signed: 07/13/2022 12:51:31 PM By: Worthy Rancher Entered By: Worthy Rancher on 07/13/2022 10:44:31 -------------------------------------------------------------------------------- Patient/Caregiver Education Details Patient Name: Date of Service: Gavin Dennis 3/14/2024andnbsp10:15 [place] Record Number: [id number] Patient Account Number: [id number] Date of Birth/Gender: Treating RN: 10-Jan-1951 (72 y.o. Erie Noe Primary Care  Physician: Early Osmond Other Clinician: Referring Physician: Treating Physician/Extender: Young Berry, Rinka Weeks in Treatment: 4 [place], Jeneen Rinks ([id number]) [id number] Page 6 of 7 Education Assessment Education Provided To: Patient Education Topics Provided Wound/Skin Impairment: Methods: Explain/Verbal Responses: Reinforcements needed, State content correctly Electronic Signature(s) Signed: 07/14/2022 10:47:35 AM By: Rhae Hammock RN Entered By: Rhae Hammock on 07/13/2022 10:43:41 -------------------------------------------------------------------------------- Wound Assessment Details Patient Name: Date of Service: Gavin Dennis, Gavin Dennis 07/13/2022 10:15 A M Medical Record Number: [id number] Patient Account Number: [id number] Date of Birth/Sex: Treating RN: 08/12/1950 (72 y.o. Burnadette Pop, Lauren Primary Care Quentavious Rittenhouse: Early Osmond Other Clinician: Referring Harbour Nordmeyer: Treating Denilson Salminen/Extender: Young Berry, Rinka Weeks in Treatment: 4 Wound Status Wound Number: 1 Primary Etiology: Venous Leg Ulcer Wound Location: Right, Anterior Lower Leg Secondary Etiology: Diabetic Wound/Ulcer of the Lower Extremity Wounding Event: Blister Wound Status: Healed - Epithelialized Date Acquired: 04/11/2022 Comorbid History: Hypertension, Type II Diabetes Weeks Of Treatment: 4 Clustered Wound: Yes Photos Wound Measurements Length: (cm) Width: (cm) Depth: (cm) Clustered Quantity: Area: (cm) Volume: (cm) 0 % Reduction in Area: 100% 0 % Reduction in  Volume: 100% 0 Epithelialization: Large (67-100%) 1 0 0 Wound Description Classification: Full Thickness Without Exposed Supp Wound Margin: Distinct, outline attached Exudate Amount: Medium Exudate Type: Serosanguineous Exudate Color: red, brown ort Structures Foul Odor After Cleansing: No Slough/Fibrino Yes Wound Bed Granulation Amount: Large (67-100%) Exposed  Structure Granulation Quality: Red, Pink Fascia Exposed: No Necrotic Amount: Small (1-33%) Fat Layer (Subcutaneous Tissue) Exposed: Yes Tendon Exposed: No Muscle Exposed: No Joint Exposed: No Gavin Dennis, Gavin Dennis ([id number]) [id number] Page 7 of 7 Bone Exposed: No Periwound Skin Texture Texture Color No Abnormalities Noted: No No Abnormalities Noted: No Callus: No Atrophie Blanche: No Crepitus: No Cyanosis: No Excoriation: No Ecchymosis: No Induration: No Erythema: No Rash: No Hemosiderin Staining: Yes Scarring: Yes Mottled: No Pallor: No Moisture Rubor: No No Abnormalities Noted: No Dry / Scaly: No Maceration: No Treatment Notes Wound #1 (Lower Leg) Wound Laterality: Right, Anterior Cleanser Peri-Wound Care Topical Primary Dressing Secondary Dressing Secured With Compression Wrap Compression Stockings Add-Ons Electronic Signature(s) Signed: 07/14/2022 10:47:35 AM By: Rhae Hammock RN Entered By: Rhae Hammock on 07/13/2022 10:58:01 -------------------------------------------------------------------------------- Vitals Details Patient Name: Date of Service: Gavin Dennis, Gavin Dennis 07/13/2022 10:15 A M Medical Record Number: [id number] Patient Account Number: [id number] Date of Birth/Sex: Treating RN: 1951/01/25 (72 y.o. M) Primary Care Riane Rung: Early Osmond Other Clinician: Referring Tanvi Gatling: Treating Benedict Kue/Extender: Young Berry, Rinka Weeks in Treatment: 4 Vital Signs Time Taken: 10:44 Temperature (F): 97.9 Height (in): 71 Pulse (bpm): 76 Weight (lbs): 219 Respiratory Rate (breaths/min): 20 Body Mass Index (BMI): 30.5 Blood Pressure (mmHg): 149/92 Reference Range: 80 - 120 mg / dl Electronic Signature(s) Signed: 07/13/2022 12:51:31 PM By: Worthy Rancher Entered By: Worthy Rancher on 07/13/2022 10:44:26

## 2022-07-17 ENCOUNTER — Inpatient Hospital Stay: Payer: Medicare HMO

## 2022-07-17 ENCOUNTER — Other Ambulatory Visit: Payer: Self-pay

## 2022-07-17 ENCOUNTER — Ambulatory Visit
Admission: RE | Admit: 2022-07-17 | Discharge: 2022-07-17 | Disposition: A | Discharge status: Home / Self Care | Payer: Medicare HMO | Source: Ambulatory Visit | Attending: Radiation Oncology | Admitting: Radiation Oncology

## 2022-07-17 VITALS — BP 125/99 | HR 74 | Temp 98.8°F | Resp 18 | Wt 205.0 lb

## 2022-07-17 DIAGNOSIS — C3490 Malignant neoplasm of unspecified part of unspecified bronchus or lung: Secondary | ICD-10-CM

## 2022-07-17 DIAGNOSIS — C771 Secondary and unspecified malignant neoplasm of intrathoracic lymph nodes: Secondary | ICD-10-CM | POA: Diagnosis not present

## 2022-07-17 DIAGNOSIS — Z510 Encounter for antineoplastic radiation therapy: Secondary | ICD-10-CM | POA: Diagnosis not present

## 2022-07-17 DIAGNOSIS — Z87891 Personal history of nicotine dependence: Secondary | ICD-10-CM | POA: Diagnosis not present

## 2022-07-17 DIAGNOSIS — C801 Malignant (primary) neoplasm, unspecified: Secondary | ICD-10-CM | POA: Diagnosis not present

## 2022-07-17 DIAGNOSIS — Z5111 Encounter for antineoplastic chemotherapy: Secondary | ICD-10-CM | POA: Diagnosis not present

## 2022-07-17 DIAGNOSIS — C781 Secondary malignant neoplasm of mediastinum: Secondary | ICD-10-CM | POA: Diagnosis not present

## 2022-07-17 LAB — CBC WITH DIFFERENTIAL (CANCER CENTER ONLY)
Abs Immature Granulocytes: 0.03 10*3/uL (ref 0.00–0.07)
Basophils Absolute: 0 10*3/uL (ref 0.0–0.1)
Basophils Relative: 0 %
Eosinophils Absolute: 0.1 10*3/uL (ref 0.0–0.5)
Eosinophils Relative: 1 %
HCT: 40.2 % (ref 39.0–52.0)
Hemoglobin: 13.6 g/dL (ref 13.0–17.0)
Immature Granulocytes: 1 %
Lymphocytes Relative: 33 %
Lymphs Abs: 1.7 10*3/uL (ref 0.7–4.0)
MCH: 30.8 pg (ref 26.0–34.0)
MCHC: 33.8 g/dL (ref 30.0–36.0)
MCV: 91 fL (ref 80.0–100.0)
Monocytes Absolute: 0.4 10*3/uL (ref 0.1–1.0)
Monocytes Relative: 7 %
Neutro Abs: 3 10*3/uL (ref 1.7–7.7)
Neutrophils Relative %: 58 %
Platelet Count: 217 10*3/uL (ref 150–400)
RBC: 4.42 MIL/uL (ref 4.22–5.81)
RDW: 14.6 % (ref 11.5–15.5)
WBC Count: 5.2 10*3/uL (ref 4.0–10.5)
nRBC: 0 % (ref 0.0–0.2)

## 2022-07-17 LAB — CMP (CANCER CENTER ONLY)
ALT: 19 U/L (ref 0–44)
AST: 14 U/L — ABNORMAL LOW (ref 15–41)
Albumin: 3.9 g/dL (ref 3.5–5.0)
Alkaline Phosphatase: 79 U/L (ref 38–126)
Anion gap: 6 (ref 5–15)
BUN: 28 mg/dL — ABNORMAL HIGH (ref 8–23)
CO2: 27 mmol/L (ref 22–32)
Calcium: 11.1 mg/dL — ABNORMAL HIGH (ref 8.9–10.3)
Chloride: 106 mmol/L (ref 98–111)
Creatinine: 1.18 mg/dL (ref 0.61–1.24)
GFR, Estimated: >60 mL/min (ref >60)
Glucose, Bld: 103 mg/dL — ABNORMAL HIGH (ref 70–99)
Potassium: 4.5 mmol/L (ref 3.5–5.1)
Sodium: 139 mmol/L (ref 135–145)
Total Bilirubin: 0.7 mg/dL (ref 0.3–1.2)
Total Protein: 7.6 g/dL (ref 6.5–8.1)

## 2022-07-17 LAB — RAD ONC ARIA SESSION SUMMARY
Course Elapsed Days: 0
Plan Fractions Treated to Date: 1
Plan Prescribed Dose Per Fraction: 1.8 Gy
Plan Total Fractions Prescribed: 28
Plan Total Prescribed Dose: 50.4 Gy
Reference Point Dosage Given to Date: 1.8 Gy
Reference Point Session Dosage Given: 1.8 Gy
Session Number: 1

## 2022-07-17 MED ORDER — SODIUM CHLORIDE 0.9 % IV SOLN
200.0000 mg | Freq: Once | INTRAVENOUS | Status: AC
  Administered 2022-07-17: 200 mg via INTRAVENOUS
  Filled 2022-07-17: qty 20

## 2022-07-17 MED ORDER — DIPHENHYDRAMINE HCL 50 MG/ML IJ SOLN
50.0000 mg | Freq: Once | INTRAMUSCULAR | Status: AC
  Administered 2022-07-17: 50 mg via INTRAVENOUS
  Filled 2022-07-17: qty 1

## 2022-07-17 MED ORDER — SODIUM CHLORIDE 0.9 % IV SOLN
45.0000 mg/m2 | Freq: Once | INTRAVENOUS | Status: AC
  Administered 2022-07-17: 96 mg via INTRAVENOUS
  Filled 2022-07-17: qty 16

## 2022-07-17 MED ORDER — PALONOSETRON HCL INJECTION 0.25 MG/5ML
0.2500 mg | Freq: Once | INTRAVENOUS | Status: AC
  Administered 2022-07-17: 0.25 mg via INTRAVENOUS
  Filled 2022-07-17: qty 5

## 2022-07-17 MED ORDER — SODIUM CHLORIDE 0.9 % IV SOLN: Freq: Once | INTRAVENOUS | Status: AC

## 2022-07-17 MED ORDER — FAMOTIDINE IN NACL 20-0.9 MG/50ML-% IV SOLN
20.0000 mg | Freq: Once | INTRAVENOUS | Status: AC
  Administered 2022-07-17: 20 mg via INTRAVENOUS
  Filled 2022-07-17: qty 50

## 2022-07-17 MED ORDER — SODIUM CHLORIDE 0.9 % IV SOLN
10.0000 mg | Freq: Once | INTRAVENOUS | Status: AC
  Administered 2022-07-17: 10 mg via INTRAVENOUS
  Filled 2022-07-17: qty 10

## 2022-07-17 NOTE — Progress Notes (Signed)
Spoke w/ Dr Julien Nordmann and he would like to give calculated dose of Carboplatin. Will change to 200 mg of Carbo  Larene Beach, PharmD

## 2022-07-17 NOTE — Patient Instructions (Signed)
[facility]  Discharge Instructions: Thank you for choosing [facility] to provide your oncology and hematology care.   If you have a lab appointment with the Cancer Center, please go directly to the Cancer Center and check in at the registration area.   Wear comfortable clothing and clothing appropriate for easy access to any Portacath or PICC line.   We strive to give you quality time with your provider. You may need to reschedule your appointment if you arrive late (15 or more minutes).  Arriving late affects you and other patients whose appointments are after yours.  Also, if you miss three or more appointments without notifying the office, you may be dismissed from the clinic at the provider's discretion.      For prescription refill requests, have your pharmacy contact our office and allow 72 hours for refills to be completed.    Today you received the following chemotherapy and/or immunotherapy agents: Paclitaxel, Carboplatin      To help prevent nausea and vomiting after your treatment, we encourage you to take your nausea medication as directed.  BELOW ARE SYMPTOMS THAT SHOULD BE REPORTED IMMEDIATELY: *FEVER GREATER THAN 100.4 F (38 C) OR HIGHER *CHILLS OR SWEATING *NAUSEA AND VOMITING THAT IS NOT CONTROLLED WITH YOUR NAUSEA MEDICATION *UNUSUAL SHORTNESS OF BREATH *UNUSUAL BRUISING OR BLEEDING *URINARY PROBLEMS (pain or burning when urinating, or frequent urination) *BOWEL PROBLEMS (unusual diarrhea, constipation, pain near the anus) TENDERNESS IN MOUTH AND THROAT WITH OR WITHOUT PRESENCE OF ULCERS (sore throat, sores in mouth, or a toothache) UNUSUAL RASH, SWELLING OR PAIN  UNUSUAL VAGINAL DISCHARGE OR ITCHING   Items with * indicate a potential emergency and should be followed up as soon as possible or go to the Emergency Department if any problems should occur.  Please show the CHEMOTHERAPY ALERT CARD or IMMUNOTHERAPY ALERT  CARD at check-in to the Emergency Department and triage nurse.  Should you have questions after your visit or need to cancel or reschedule your appointment, please contact [facility]  Dept: [phone]  and follow the prompts.  Office hours are 8:00 a.m. to 4:30 p.m. Monday - Friday. Please note that voicemails left after 4:00 p.m. may not be returned until the following business day.  We are closed weekends and major holidays. You have access to a nurse at all times for urgent questions. Please call the main number to the clinic Dept: [phone] and follow the prompts.   For any non-urgent questions, you may also contact your provider using MyChart. We now offer e-Visits for anyone 18 and older to request care online for non-urgent symptoms. For details visit [email or web address].   Also download the MyChart app! Go to the app store, search "MyChart", open the app, select [facility], and log in with your MyChart username and password.  Paclitaxel Injection What is this medication? PACLITAXEL (PAK li TAX el) treats some types of cancer. It works by slowing down the growth of cancer cells. This medicine may be used for other purposes; ask your health care provider or pharmacist if you have questions. COMMON BRAND NAME(S): Onxol, Taxol What should I tell my care team before I take this medication? They need to know if you have any of these conditions: Heart disease Liver disease Low white blood cell levels An unusual or allergic reaction to paclitaxel, other medications, foods, dyes, or preservatives If you or your partner are pregnant or trying to   get pregnant Breast-feeding How should I use this medication? This medication is injected into a vein. It is given by your care team in a hospital or clinic setting. Talk to your care team about the use of this medication in children. While it may be given to children for selected conditions, precautions  do apply. Overdosage: If you think you have taken too much of this medicine contact a poison control center or emergency room at once. NOTE: This medicine is only for you. Do not share this medicine with others. What if I miss a dose? Keep appointments for follow-up doses. It is important not to miss your dose. Call your care team if you are unable to keep an appointment. What may interact with this medication? Do not take this medication with any of the following: Live virus vaccines Other medications may affect the way this medication works. Talk with your care team about all of the medications you take. They may suggest changes to your treatment plan to lower the risk of side effects and to make sure your medications work as intended. This list may not describe all possible interactions. Give your health care provider a list of all the medicines, herbs, non-prescription drugs, or dietary supplements you use. Also tell them if you smoke, drink alcohol, or use illegal drugs. Some items may interact with your medicine. What should I watch for while using this medication? Your condition will be monitored carefully while you are receiving this medication. You may need blood work while taking this medication. This medication may make you feel generally unwell. This is not uncommon as chemotherapy can affect healthy cells as well as cancer cells. Report any side effects. Continue your course of treatment even though you feel ill unless your care team tells you to stop. This medication can cause serious allergic reactions. To reduce the risk, your care team may give you other medications to take before receiving this one. Be sure to follow the directions from your care team. This medication may increase your risk of getting an infection. Call your care team for advice if you get a fever, chills, sore throat, or other symptoms of a cold or flu. Do not treat yourself. Try to avoid being around people who are  sick. This medication may increase your risk to bruise or bleed. Call your care team if you notice any unusual bleeding. Be careful brushing or flossing your teeth or using a toothpick because you may get an infection or bleed more easily. If you have any dental work done, tell your dentist you are receiving this medication. Talk to your care team if you may be pregnant. Serious birth defects can occur if you take this medication during pregnancy. Talk to your care team before breastfeeding. Changes to your treatment plan may be needed. What side effects may I notice from receiving this medication? Side effects that you should report to your care team as soon as possible: Allergic reactions--skin rash, itching, hives, swelling of the face, lips, tongue, or throat Heart rhythm changes--fast or irregular heartbeat, dizziness, feeling faint or lightheaded, chest pain, trouble breathing Increase in blood pressure Infection--fever, chills, cough, sore throat, wounds that don't heal, pain or trouble when passing urine, general feeling of discomfort or being unwell Low blood pressure--dizziness, feeling faint or lightheaded, blurry vision Low red blood cell level--unusual weakness or fatigue, dizziness, headache, trouble breathing Painful swelling, warmth, or redness of the skin, blisters or sores at the infusion site Pain, tingling, or numbness   in the hands or feet Slow heartbeat--dizziness, feeling faint or lightheaded, confusion, trouble breathing, unusual weakness or fatigue Unusual bruising or bleeding Side effects that usually do not require medical attention (report to your care team if they continue or are bothersome): Diarrhea Hair loss Joint pain Loss of appetite Muscle pain Nausea Vomiting This list may not describe all possible side effects. Call your doctor for medical advice about side effects. You may report side effects to FDA at 1-800-FDA-1088. Where should I keep my  medication? This medication is given in a hospital or clinic. It will not be stored at home. NOTE: This sheet is a summary. It may not cover all possible information. If you have questions about this medicine, talk to your doctor, pharmacist, or health care provider.  2023 Elsevier/Gold Standard (2021-09-01 00:00:00)  Carboplatin Injection What is this medication? CARBOPLATIN (KAR boe pla tin) treats some types of cancer. It works by slowing down the growth of cancer cells. This medicine may be used for other purposes; ask your health care provider or pharmacist if you have questions. COMMON BRAND NAME(S): Paraplatin What should I tell my care team before I take this medication? They need to know if you have any of these conditions: Blood disorders Hearing problems Kidney disease Recent or ongoing radiation therapy An unusual or allergic reaction to carboplatin, cisplatin, other medications, foods, dyes, or preservatives Pregnant or trying to get pregnant Breast-feeding How should I use this medication? This medication is injected into a vein. It is given by your care team in a hospital or clinic setting. Talk to your care team about the use of this medication in children. Special care may be needed. Overdosage: If you think you have taken too much of this medicine contact a poison control center or emergency room at once. NOTE: This medicine is only for you. Do not share this medicine with others. What if I miss a dose? Keep appointments for follow-up doses. It is important not to miss your dose. Call your care team if you are unable to keep an appointment. What may interact with this medication? Medications for seizures Some antibiotics, such as amikacin, gentamicin, neomycin, streptomycin, tobramycin Vaccines This list may not describe all possible interactions. Give your health care provider a list of all the medicines, herbs, non-prescription drugs, or dietary supplements you use.  Also tell them if you smoke, drink alcohol, or use illegal drugs. Some items may interact with your medicine. What should I watch for while using this medication? Your condition will be monitored carefully while you are receiving this medication. You may need blood work while taking this medication. This medication may make you feel generally unwell. This is not uncommon, as chemotherapy can affect healthy cells as well as cancer cells. Report any side effects. Continue your course of treatment even though you feel ill unless your care team tells you to stop. In some cases, you may be given additional medications to help with side effects. Follow all directions for their use. This medication may increase your risk of getting an infection. Call your care team for advice if you get a fever, chills, sore throat, or other symptoms of a cold or flu. Do not treat yourself. Try to avoid being around people who are sick. Avoid taking medications that contain aspirin, acetaminophen, ibuprofen, naproxen, or ketoprofen unless instructed by your care team. These medications may hide a fever. Be careful brushing or flossing your teeth or using a toothpick because you may get an   infection or bleed more easily. If you have any dental work done, tell your dentist you are receiving this medication. Talk to your care team if you wish to become pregnant or think you might be pregnant. This medication can cause serious birth defects. Talk to your care team about effective forms of contraception. Do not breast-feed while taking this medication. What side effects may I notice from receiving this medication? Side effects that you should report to your care team as soon as possible: Allergic reactions--skin rash, itching, hives, swelling of the face, lips, tongue, or throat Infection--fever, chills, cough, sore throat, wounds that don't heal, pain or trouble when passing urine, general feeling of discomfort or being  unwell Low red blood cell level--unusual weakness or fatigue, dizziness, headache, trouble breathing Pain, tingling, or numbness in the hands or feet, muscle weakness, change in vision, confusion or trouble speaking, loss of balance or coordination, trouble walking, seizures Unusual bruising or bleeding Side effects that usually do not require medical attention (report to your care team if they continue or are bothersome): Hair loss Nausea Unusual weakness or fatigue Vomiting This list may not describe all possible side effects. Call your doctor for medical advice about side effects. You may report side effects to FDA at 1-800-FDA-1088. Where should I keep my medication? This medication is given in a hospital or clinic. It will not be stored at home. NOTE: This sheet is a summary. It may not cover all possible information. If you have questions about this medicine, talk to your doctor, pharmacist, or health care provider.  2023 Elsevier/Gold Standard (2007-06-08 00:00:00)  

## 2022-07-18 ENCOUNTER — Other Ambulatory Visit: Payer: Self-pay

## 2022-07-18 ENCOUNTER — Ambulatory Visit
Admission: RE | Admit: 2022-07-18 | Discharge: 2022-07-18 | Disposition: A | Discharge status: Home / Self Care | Payer: Medicare HMO | Source: Ambulatory Visit | Attending: Radiation Oncology | Admitting: Radiation Oncology

## 2022-07-18 DIAGNOSIS — Z5111 Encounter for antineoplastic chemotherapy: Secondary | ICD-10-CM | POA: Diagnosis not present

## 2022-07-18 DIAGNOSIS — C801 Malignant (primary) neoplasm, unspecified: Secondary | ICD-10-CM | POA: Diagnosis not present

## 2022-07-18 DIAGNOSIS — C3490 Malignant neoplasm of unspecified part of unspecified bronchus or lung: Secondary | ICD-10-CM

## 2022-07-18 DIAGNOSIS — Z510 Encounter for antineoplastic radiation therapy: Secondary | ICD-10-CM | POA: Diagnosis not present

## 2022-07-18 DIAGNOSIS — C771 Secondary and unspecified malignant neoplasm of intrathoracic lymph nodes: Secondary | ICD-10-CM | POA: Diagnosis not present

## 2022-07-18 DIAGNOSIS — C781 Secondary malignant neoplasm of mediastinum: Secondary | ICD-10-CM | POA: Diagnosis not present

## 2022-07-18 DIAGNOSIS — Z87891 Personal history of nicotine dependence: Secondary | ICD-10-CM | POA: Diagnosis not present

## 2022-07-18 LAB — RAD ONC ARIA SESSION SUMMARY
Course Elapsed Days: 1
Plan Fractions Treated to Date: 2
Plan Prescribed Dose Per Fraction: 1.8 Gy
Plan Total Fractions Prescribed: 28
Plan Total Prescribed Dose: 50.4 Gy
Reference Point Dosage Given to Date: 3.6 Gy
Reference Point Session Dosage Given: 1.8 Gy
Session Number: 2

## 2022-07-18 MED ORDER — SONAFINE EX EMUL
1.0000 | Freq: Once | CUTANEOUS | Status: AC
  Administered 2022-07-18: 1 via TOPICAL

## 2022-07-19 ENCOUNTER — Other Ambulatory Visit: Payer: Self-pay

## 2022-07-19 ENCOUNTER — Ambulatory Visit
Admission: RE | Admit: 2022-07-19 | Discharge: 2022-07-19 | Disposition: A | Discharge status: Home / Self Care | Payer: Medicare HMO | Source: Ambulatory Visit | Attending: Radiation Oncology | Admitting: Radiation Oncology

## 2022-07-19 DIAGNOSIS — C781 Secondary malignant neoplasm of mediastinum: Secondary | ICD-10-CM | POA: Diagnosis not present

## 2022-07-19 DIAGNOSIS — I69398 Other sequelae of cerebral infarction: Secondary | ICD-10-CM | POA: Diagnosis not present

## 2022-07-19 DIAGNOSIS — C771 Secondary and unspecified malignant neoplasm of intrathoracic lymph nodes: Secondary | ICD-10-CM | POA: Diagnosis not present

## 2022-07-19 DIAGNOSIS — L089 Local infection of the skin and subcutaneous tissue, unspecified: Secondary | ICD-10-CM | POA: Diagnosis not present

## 2022-07-19 DIAGNOSIS — C801 Malignant (primary) neoplasm, unspecified: Secondary | ICD-10-CM | POA: Diagnosis not present

## 2022-07-19 DIAGNOSIS — E7800 Pure hypercholesterolemia, unspecified: Secondary | ICD-10-CM | POA: Diagnosis not present

## 2022-07-19 DIAGNOSIS — E1169 Type 2 diabetes mellitus with other specified complication: Secondary | ICD-10-CM | POA: Diagnosis not present

## 2022-07-19 DIAGNOSIS — Z510 Encounter for antineoplastic radiation therapy: Secondary | ICD-10-CM | POA: Diagnosis not present

## 2022-07-19 DIAGNOSIS — Z87891 Personal history of nicotine dependence: Secondary | ICD-10-CM | POA: Diagnosis not present

## 2022-07-19 DIAGNOSIS — I700 Atherosclerosis of aorta: Secondary | ICD-10-CM | POA: Diagnosis not present

## 2022-07-19 DIAGNOSIS — Z5111 Encounter for antineoplastic chemotherapy: Secondary | ICD-10-CM | POA: Diagnosis not present

## 2022-07-19 DIAGNOSIS — H547 Unspecified visual loss: Secondary | ICD-10-CM | POA: Diagnosis not present

## 2022-07-19 DIAGNOSIS — J439 Emphysema, unspecified: Secondary | ICD-10-CM | POA: Diagnosis not present

## 2022-07-19 DIAGNOSIS — I119 Hypertensive heart disease without heart failure: Secondary | ICD-10-CM | POA: Diagnosis not present

## 2022-07-19 DIAGNOSIS — C3490 Malignant neoplasm of unspecified part of unspecified bronchus or lung: Secondary | ICD-10-CM | POA: Diagnosis not present

## 2022-07-19 DIAGNOSIS — M1712 Unilateral primary osteoarthritis, left knee: Secondary | ICD-10-CM | POA: Diagnosis not present

## 2022-07-19 LAB — RAD ONC ARIA SESSION SUMMARY
Course Elapsed Days: 2
Plan Fractions Treated to Date: 3
Plan Prescribed Dose Per Fraction: 1.8 Gy
Plan Total Fractions Prescribed: 28
Plan Total Prescribed Dose: 50.4 Gy
Reference Point Dosage Given to Date: 5.4 Gy
Reference Point Session Dosage Given: 1.8 Gy
Session Number: 3

## 2022-07-20 ENCOUNTER — Other Ambulatory Visit: Payer: Self-pay

## 2022-07-20 ENCOUNTER — Ambulatory Visit
Admission: RE | Admit: 2022-07-20 | Discharge: 2022-07-20 | Disposition: A | Discharge status: Home / Self Care | Payer: Medicare HMO | Source: Ambulatory Visit | Attending: Radiation Oncology | Admitting: Radiation Oncology

## 2022-07-20 DIAGNOSIS — C3490 Malignant neoplasm of unspecified part of unspecified bronchus or lung: Secondary | ICD-10-CM | POA: Diagnosis not present

## 2022-07-20 DIAGNOSIS — Z510 Encounter for antineoplastic radiation therapy: Secondary | ICD-10-CM | POA: Diagnosis not present

## 2022-07-20 DIAGNOSIS — C771 Secondary and unspecified malignant neoplasm of intrathoracic lymph nodes: Secondary | ICD-10-CM | POA: Diagnosis not present

## 2022-07-20 DIAGNOSIS — C781 Secondary malignant neoplasm of mediastinum: Secondary | ICD-10-CM | POA: Diagnosis not present

## 2022-07-20 DIAGNOSIS — C801 Malignant (primary) neoplasm, unspecified: Secondary | ICD-10-CM | POA: Diagnosis not present

## 2022-07-20 DIAGNOSIS — Z5111 Encounter for antineoplastic chemotherapy: Secondary | ICD-10-CM | POA: Diagnosis not present

## 2022-07-20 DIAGNOSIS — Z87891 Personal history of nicotine dependence: Secondary | ICD-10-CM | POA: Diagnosis not present

## 2022-07-20 LAB — RAD ONC ARIA SESSION SUMMARY
Course Elapsed Days: 3
Plan Fractions Treated to Date: 4
Plan Prescribed Dose Per Fraction: 1.8 Gy
Plan Total Fractions Prescribed: 28
Plan Total Prescribed Dose: 50.4 Gy
Reference Point Dosage Given to Date: 7.2 Gy
Reference Point Session Dosage Given: 1.8 Gy
Session Number: 4

## 2022-07-20 NOTE — Progress Notes (Signed)
[facility] OFFICE PROGRESS NOTE  Pahwani, Michell Heinrich, MD [address] [place] [address]  DIAGNOSIS: Stage IIIb (T0 N2, M0) non-small cell lung cancer, adenocarcinoma presented with right hilar and mediastinal lymphadenopathy with no clear primary diagnosed in February 2024.   Molecular studies by [id number] that showed positive KRAS G12C mutation and PD-L1 expression of 53%.  PRIOR THERAPY: None  CURRENT THERAPY: Concurrent chemoradiation with weekly carboplatin for AUC of 2 and paclitaxel 45 Mg/M2. First cycle July 10, 2022. Status post 2 cycles.   INTERVAL HISTORY: Gavin Dennis 72 y.o. male returns to the clinic today for a follow-up visit.  The patient is feeling fine today with no concerning complaints. He is currently undergoing chemoRT which he is tolerating well. His last day of radiation is scheduled for 08/23/22. He mentions he has a heart rate monitor and had an episode of tachycardia which he states he did not feel. He had received a call about this from his monitoring company. Today, he denies any fever or chills. He denied having any chest pain or hemoptysis. He denies any significant dyspnea unless he is bending over. He denies any major cough. He has no nausea, vomiting, or constipation. He had one large episode of diarrhea in the interval which improved without any intervention. He denies dysuria or skin infection. He mentions some firmness and soreness over a vein in his right forearm. He cannot remember if he had had IV chemotherapy at this site. He denies associated swelling or warmth. He often has baseline lower extremity swelling, however, he states it is improved compared to prior and he has "graduated" from the wound clinic. He has no recent weight loss or night sweats.  He is here for evaluation and repeat blood work before starting cycle #3.  Marland Kitchen   MEDICAL HISTORY: Past Medical History:  Diagnosis Date   Blurred vision, left eye    since  stroke 06/2020   CVA (cerebral vascular accident) ([place]) 06/2020   DDD (degenerative disc disease), lumbar    Diabetes mellitus without complication ([place])    Hyperlipidemia    Hypertension    Hypomagnesemia    Polio    childhood   Vitamin B 12 deficiency     ALLERGIES:  has No Known Allergies.  MEDICATIONS:  Current Outpatient Medications  Medication Sig Dispense Refill   amLODipine (NORVASC) 10 MG tablet Take 1 tablet (10 mg total) by mouth daily. 90 tablet 0   aspirin EC 81 MG tablet Take 81 mg by mouth in the morning. Swallow whole. (Patient not taking: Reported on 06/26/2022)     atorvastatin (LIPITOR) 20 MG tablet Take 20 mg by mouth in the morning.     BREZTRI AEROSPHERE 160-9-4.8 MCG/ACT AERO Inhale 2 puffs into the lungs daily.     carvedilol (COREG) 6.25 MG tablet Take 1 tablet (6.25 mg total) by mouth 2 (two) times daily. (Patient not taking: Reported on 07/10/2022) 60 tablet 5   clopidogrel (PLAVIX) 75 MG tablet Take 1 tablet (75 mg total) by mouth daily. 30 tablet 3   Cyanocobalamin (VITAMIN B-12 PO) Take 1 tablet by mouth daily.     metFORMIN (GLUCOPHAGE) 500 MG tablet Take 500 mg by mouth in the morning.     metoprolol succinate (TOPROL-XL) 25 MG 24 hr tablet Take 25 mg by mouth daily.     Omega-3 Fatty Acids (OMEGA-3 PO) Take 1 capsule by mouth in the morning. OmegaXL Joint Pain Relief & Inflammation Supplement  omeprazole (PRILOSEC) 40 MG capsule Take 40 mg by mouth daily as needed (indigestion.).     potassium chloride (KLOR-CON) 10 MEQ tablet Take 10 mEq by mouth daily.     prochlorperazine (COMPAZINE) 10 MG tablet Take 1 tablet (10 mg total) by mouth every 6 (six) hours as needed for nausea or vomiting. 30 tablet 0   Study - OCEANIC-STROKE - asundexian 50 mg or placebo tablet (PI-Sethi) Take 1 tablet (50 mg total) by mouth daily. For Investigational Use Only. Take at the same time each day (preferably in the morning). Tablet should be swallowed whole with water; it  CANNOT be crushed or broken. (Patient not taking: Reported on 07/07/2022) 98 tablet 0   No current facility-administered medications for this visit.    SURGICAL HISTORY:  Past Surgical History:  Procedure Laterality Date   BRONCHIAL NEEDLE ASPIRATION BIOPSY  06/12/2022   Procedure: BRONCHIAL NEEDLE ASPIRATION BIOPSIES;  Surgeon: Marshell Garfinkel, MD;  Location: [facility];  Service: Cardiopulmonary;;   COLONOSCOPY     ENDOBRONCHIAL ULTRASOUND Bilateral 06/12/2022   Procedure: ENDOBRONCHIAL ULTRASOUND;  Surgeon: Marshell Garfinkel, MD;  Location: [facility];  Service: Cardiopulmonary;  Laterality: Bilateral;   HEMOSTASIS CONTROL  06/12/2022   Procedure: HEMOSTASIS CONTROL;  Surgeon: Marshell Garfinkel, MD;  Location: [facility];  Service: Cardiopulmonary;;   LOOP RECORDER INSERTION N/A 03/15/2022   Procedure: LOOP RECORDER INSERTION;  Surgeon: Melida Quitter, MD;  Location: [facility];  Service: Cardiovascular;  Laterality: N/A;   VIDEO BRONCHOSCOPY N/A 06/12/2022   Procedure: VIDEO BRONCHOSCOPY WITHOUT FLUORO;  Surgeon: Marshell Garfinkel, MD;  Location: [facility];  Service: Cardiopulmonary;  Laterality: N/A;    REVIEW OF SYSTEMS:   Review of Systems  Constitutional: Negative for appetite change, chills, fatigue, fever and unexpected weight change.  HENT: Negative for mouth sores, nosebleeds, sore throat and trouble swallowing.   Eyes: Negative for eye problems and icterus.  Respiratory: Negative for cough, hemoptysis, shortness of breath and wheezing.   Cardiovascular: Negative for chest pain. Positive for reportedly improved bilateral lower extremity edema. Gastrointestinal: Negative for abdominal pain, constipation, diarrhea (none at this time), nausea and vomiting.  Genitourinary: Negative for bladder incontinence, difficulty urinating, dysuria, frequency and hematuria.   Musculoskeletal: Negative for back pain, gait problem, neck pain and neck stiffness.  Skin: Positive for  phlebitis of superficial vein in right forearm. Negative for itching and rash.  Neurological: Negative for dizziness, extremity weakness, gait problem, headaches, light-headedness and seizures.  Hematological: Negative for adenopathy. Does not bruise/bleed easily.  Psychiatric/Behavioral: Negative for confusion, depression and sleep disturbance. The patient is not nervous/anxious.     PHYSICAL EXAMINATION:  There were no vitals taken for this visit.  ECOG PERFORMANCE STATUS: 1  Physical Exam  Constitutional: Oriented to person, place, and time and well-developed, well-nourished, and in no distress.  HENT:  Head: Normocephalic and atraumatic.  Mouth/Throat: Oropharynx is clear and moist. No oropharyngeal exudate.  Eyes: Conjunctivae are normal. Right eye exhibits no discharge. Left eye exhibits no discharge. No scleral icterus.  Neck: Normal range of motion. Neck supple.  Cardiovascular: Normal rate, regular rhythm, normal heart sounds and intact distal pulses.   Pulmonary/Chest: Effort normal and breath sounds normal. No respiratory distress. No wheezes. No rales.  Abdominal: Soft. Bowel sounds are normal. Exhibits no distension and no mass. There is no tenderness.  Musculoskeletal: Normal range of motion. Positive for bilateral lower extremity pitting edema.  Lymphadenopathy:    No cervical adenopathy.  Neurological: Alert and oriented to  person, place, and time. Exhibits normal muscle tone. Gait normal. Coordination normal.  Skin: Skin is warm and dry. No rash noted. Not diaphoretic. No erythema. No pallor.  Psychiatric: Mood, memory and judgment normal.  Vitals reviewed.  LABORATORY DATA: Lab Results  Component Value Date   WBC 5.2 07/17/2022   HGB 13.6 07/17/2022   HCT 40.2 07/17/2022   MCV 91.0 07/17/2022   PLT 217 07/17/2022      Chemistry      Component Value Date/Time   NA 139 07/17/2022 1304   NA 140 03/06/2022 1715   K 4.5 07/17/2022 1304   CL 106 07/17/2022  1304   CO2 27 07/17/2022 1304   BUN 28 (H) 07/17/2022 1304   BUN 29 (H) 03/06/2022 1715   CREATININE 1.18 07/17/2022 1304      Component Value Date/Time   CALCIUM 11.1 (H) 07/17/2022 1304   ALKPHOS 79 07/17/2022 1304   AST 14 (L) 07/17/2022 1304   ALT 19 07/17/2022 1304   BILITOT 0.7 07/17/2022 1304       RADIOGRAPHIC STUDIES:  CT Chest W Contrast  Result Date: 07/02/2022 CLINICAL DATA:  Staging non-small-cell lung cancer. * Tracking Code: BO * EXAM: CT CHEST WITH CONTRAST TECHNIQUE: Multidetector CT imaging of the chest was performed during intravenous contrast administration. RADIATION DOSE REDUCTION: This exam was performed according to the departmental dose-optimization program which includes automated exposure control, adjustment of the mA and/or kV according to patient size and/or use of iterative reconstruction technique. CONTRAST:  62mL OMNIPAQUE IOHEXOL 300 MG/ML  SOLN COMPARISON:  Chest CT angiogram 03/13/2022.  PET-CT 04/21/2022 FINDINGS: Cardiovascular: Heart is nonenlarged. Trace pericardial fluid. Coronary artery calcifications are seen. There is atherosclerotic plaque as well diffusely scattered along the thoracic aorta. Diameter of the ascending aorta at the level of the right pulmonary artery measures 4.2 x 4.3 cm. Mediastinum/Nodes: Small cystic foci again noted along the thyroid gland. Normal caliber thoracic esophagus. No specific abnormal lymph node enlargement identified in the axillary region. There are several abnormal nodes once again seen in the mediastinum and right hilum. Subcarinal node which previously measured in November 2023 3.1 by 1.7 cm, today on series 2, image 79 3.0 x 1.9 cm. This has some small calcifications. On the PET-CT from December 2023 this would have measured 2.7 by 1.9 cm. Paratracheal node today on series 64 of series 2 which measures 2.1 x 1.7 cm, in November 2023 when it measured 17 x 1.1 cm. In December node would have measured 17 by 13 mm.  Slightly larger today. Node upper right paratracheal which on the prior measured 15 by 23 mm, today on image 50 of series 2 measures 20 x 13 mm. December 2023 this would have measured 21 x 13 mm. Similar. Abnormal right hilar lymph node also identified. Today on series 2, image 76 measures 4.4 by 2.5 cm and previously when measured in the same fashion would have measured 2.3 x 4.1 cm. Similar other hilar nodes are also noted and are relatively similar when adjusting for technique. The hilar nodes are more difficult to measure on the PET-CT scan. This is technical without IV contrast on the attenuation correction CT for that exam. Lungs/Pleura: Centrilobular emphysematous lung changes are identified. No pneumothorax or effusion. There is some left basilar atelectasis but improved from November 2023. Similar to the study of December 2023. there is developing confluence ill-defined opacity in the dependent right upper lobe with a nodular area on image 73 of series 5 measuring  up to 3.0 by 1.7 cm. This would have a differential. There also is a 5 mm nodule which is new as well in the middle lobe on image 82. Persistent atelectasis or scar along the inferior middle lobe. Upper Abdomen: Adrenal glands are preserved in the upper abdomen. Multiple renal cystic foci as seen on prior examination. Musculoskeletal: Slight curvature of the spine. Mild degenerative changes. IMPRESSION: Multiple abnormal lymph nodes identified in the mediastinum and right hilum. Overall relatively similar when adjusting for technique compared to the previous examination. Developing nodular consolidative opacity in the dependent right upper lobe there has also a new 5 mm nodule in the middle lobe. This would have a differential including infectious, inflammatory or neoplasm. Recommend correlation to specific symptoms and short follow-up Aortic Atherosclerosis (ICD10-I70.0) and Emphysema (ICD10-J43.9). Electronically Signed   By: Jill Side M.D.    On: 07/02/2022 17:52   CUP PACEART REMOTE DEVICE CHECK  Result Date: 06/27/2022 ILR summary report received. Battery status OK. Normal device function. No new symptom, tachy, brady, or pause episodes. No new AF episodes. Monthly summary reports and ROV/PRN LA    ASSESSMENT/PLAN:  This is a very pleasant 72 year old African-American male with  stage IIIb (T0 N2, M0) non-small cell lung cancer, adenocarcinoma presented with right hilar and mediastinal lymphadenopathy with no clear primary diagnosed in February 2024. His molecular studies by [id number] that showed positive KRAS G12C mutation and PD-L1 expression of 53%.  The patient is currently undergoing a course of concurrent chemoradiation with weekly carboplatin for AUC of 2 and paclitaxel 45 Mg/M2 first dose July 10, 2022. He is status post 2 weeks of treatment.   Labs were reviewed. Labs were reviewed. His WBC is 1.8 with low ANC of 0.9. Therefore, we will cancel treatment this week. He will resume with cycle #3 next week as long as his labs are within parameters.   For now, reviewed neutropenic precautions with the patient.  Should he develop any signs and symptoms of infection in the interval, advised for him to call so he may be evaluated.  Right now he denies any fever, chills, unusual cough, sore throat, unusual shortness of breath, dysuria, present diarrhea, skin infections, etc.  I also advised him to avoid large crowds and mask/frequent handwashing.  He had 1 self-limiting episode of diarrhea last week.   We will see I'm back for a follow up visit in 2 weeks for evaluation and repeat blood work before starting cycle #2.    The patient has some thrombophlebitis on his right antecubital fossa.  There is no overlying erythema or swelling.  This may be secondary to chemotherapy if he had an IV site in this area recently.  Advised him to use warm compresses and Tylenol if needed.  However if he develops any swelling, erythema, warmth,  wounds, he would need to be evaluated sooner.  The patient was advised to call immediately if he has any concerning symptoms in the interval. The patient voices understanding of current disease status and treatment options and is in agreement with the current care plan. All questions were answered. The patient knows to call the clinic with any problems, questions or concerns. We can certainly see the patient much sooner if necessary         No orders of the defined types were placed in this encounter.    The total time spent in the appointment was 20-29 minutes.   Aviannah Castoro L Tifany Hirsch, PA-C 07/20/22

## 2022-07-21 ENCOUNTER — Telehealth: Payer: Self-pay

## 2022-07-21 ENCOUNTER — Ambulatory Visit
Admission: RE | Admit: 2022-07-21 | Discharge: 2022-07-21 | Disposition: A | Discharge status: Home / Self Care | Payer: Medicare HMO | Source: Ambulatory Visit | Attending: Radiation Oncology | Admitting: Radiation Oncology

## 2022-07-21 ENCOUNTER — Other Ambulatory Visit: Payer: Self-pay

## 2022-07-21 DIAGNOSIS — C781 Secondary malignant neoplasm of mediastinum: Secondary | ICD-10-CM | POA: Diagnosis not present

## 2022-07-21 DIAGNOSIS — C771 Secondary and unspecified malignant neoplasm of intrathoracic lymph nodes: Secondary | ICD-10-CM | POA: Diagnosis not present

## 2022-07-21 DIAGNOSIS — Z5111 Encounter for antineoplastic chemotherapy: Secondary | ICD-10-CM | POA: Diagnosis not present

## 2022-07-21 DIAGNOSIS — C3490 Malignant neoplasm of unspecified part of unspecified bronchus or lung: Secondary | ICD-10-CM | POA: Diagnosis not present

## 2022-07-21 DIAGNOSIS — Z87891 Personal history of nicotine dependence: Secondary | ICD-10-CM | POA: Diagnosis not present

## 2022-07-21 DIAGNOSIS — C801 Malignant (primary) neoplasm, unspecified: Secondary | ICD-10-CM | POA: Diagnosis not present

## 2022-07-21 DIAGNOSIS — Z510 Encounter for antineoplastic radiation therapy: Secondary | ICD-10-CM | POA: Diagnosis not present

## 2022-07-21 LAB — RAD ONC ARIA SESSION SUMMARY
Course Elapsed Days: 4
Plan Fractions Treated to Date: 5
Plan Prescribed Dose Per Fraction: 1.8 Gy
Plan Total Fractions Prescribed: 28
Plan Total Prescribed Dose: 50.4 Gy
Reference Point Dosage Given to Date: 9 Gy
Reference Point Session Dosage Given: 1.8 Gy
Session Number: 5

## 2022-07-21 MED FILL — Dexamethasone Sodium Phosphate Inj 100 MG/10ML: INTRAMUSCULAR | Qty: 1 | Status: AC

## 2022-07-21 NOTE — Telephone Encounter (Signed)
Following alert received from [employer] received for ILR alert for tachy, duration 15sec, mean HR 167, some ectopy Route for first tachy.  Patient reports of not feeling well yesterday overall. States he had radiation treatment and had left the cancer center around time of tachy event. Patient is no longer taking Coreg 6.25mg  BID as advised by his PCP per patient. Patient unsure why he was advised not to take it.   Routing to Dr. Myles Gip to review.

## 2022-07-22 DIAGNOSIS — I119 Hypertensive heart disease without heart failure: Secondary | ICD-10-CM | POA: Diagnosis not present

## 2022-07-22 DIAGNOSIS — H547 Unspecified visual loss: Secondary | ICD-10-CM | POA: Diagnosis not present

## 2022-07-22 DIAGNOSIS — E7800 Pure hypercholesterolemia, unspecified: Secondary | ICD-10-CM | POA: Diagnosis not present

## 2022-07-22 DIAGNOSIS — L089 Local infection of the skin and subcutaneous tissue, unspecified: Secondary | ICD-10-CM | POA: Diagnosis not present

## 2022-07-22 DIAGNOSIS — M1712 Unilateral primary osteoarthritis, left knee: Secondary | ICD-10-CM | POA: Diagnosis not present

## 2022-07-22 DIAGNOSIS — I700 Atherosclerosis of aorta: Secondary | ICD-10-CM | POA: Diagnosis not present

## 2022-07-22 DIAGNOSIS — I69398 Other sequelae of cerebral infarction: Secondary | ICD-10-CM | POA: Diagnosis not present

## 2022-07-22 DIAGNOSIS — J439 Emphysema, unspecified: Secondary | ICD-10-CM | POA: Diagnosis not present

## 2022-07-22 DIAGNOSIS — E1169 Type 2 diabetes mellitus with other specified complication: Secondary | ICD-10-CM | POA: Diagnosis not present

## 2022-07-24 ENCOUNTER — Inpatient Hospital Stay: Payer: Medicare HMO | Admitting: Physician Assistant

## 2022-07-24 ENCOUNTER — Inpatient Hospital Stay: Payer: Medicare HMO

## 2022-07-24 ENCOUNTER — Ambulatory Visit
Admission: RE | Admit: 2022-07-24 | Discharge: 2022-07-24 | Disposition: A | Discharge status: Home / Self Care | Payer: Medicare HMO | Source: Ambulatory Visit | Attending: Radiation Oncology | Admitting: Radiation Oncology

## 2022-07-24 ENCOUNTER — Other Ambulatory Visit: Payer: Self-pay

## 2022-07-24 VITALS — BP 133/99 | HR 108 | Temp 97.5°F | Resp 18 | Ht 71.0 in | Wt 204.5 lb

## 2022-07-24 DIAGNOSIS — I119 Hypertensive heart disease without heart failure: Secondary | ICD-10-CM | POA: Diagnosis not present

## 2022-07-24 DIAGNOSIS — E1169 Type 2 diabetes mellitus with other specified complication: Secondary | ICD-10-CM | POA: Diagnosis not present

## 2022-07-24 DIAGNOSIS — Z87891 Personal history of nicotine dependence: Secondary | ICD-10-CM | POA: Diagnosis not present

## 2022-07-24 DIAGNOSIS — C771 Secondary and unspecified malignant neoplasm of intrathoracic lymph nodes: Secondary | ICD-10-CM | POA: Diagnosis not present

## 2022-07-24 DIAGNOSIS — J439 Emphysema, unspecified: Secondary | ICD-10-CM | POA: Diagnosis not present

## 2022-07-24 DIAGNOSIS — C781 Secondary malignant neoplasm of mediastinum: Secondary | ICD-10-CM | POA: Diagnosis not present

## 2022-07-24 DIAGNOSIS — I700 Atherosclerosis of aorta: Secondary | ICD-10-CM | POA: Diagnosis not present

## 2022-07-24 DIAGNOSIS — E7800 Pure hypercholesterolemia, unspecified: Secondary | ICD-10-CM | POA: Diagnosis not present

## 2022-07-24 DIAGNOSIS — I69398 Other sequelae of cerebral infarction: Secondary | ICD-10-CM | POA: Diagnosis not present

## 2022-07-24 DIAGNOSIS — L089 Local infection of the skin and subcutaneous tissue, unspecified: Secondary | ICD-10-CM | POA: Diagnosis not present

## 2022-07-24 DIAGNOSIS — C3490 Malignant neoplasm of unspecified part of unspecified bronchus or lung: Secondary | ICD-10-CM

## 2022-07-24 DIAGNOSIS — D709 Neutropenia, unspecified: Secondary | ICD-10-CM | POA: Diagnosis not present

## 2022-07-24 DIAGNOSIS — Z510 Encounter for antineoplastic radiation therapy: Secondary | ICD-10-CM | POA: Diagnosis not present

## 2022-07-24 DIAGNOSIS — Z5111 Encounter for antineoplastic chemotherapy: Secondary | ICD-10-CM | POA: Diagnosis not present

## 2022-07-24 DIAGNOSIS — C801 Malignant (primary) neoplasm, unspecified: Secondary | ICD-10-CM | POA: Diagnosis not present

## 2022-07-24 DIAGNOSIS — H547 Unspecified visual loss: Secondary | ICD-10-CM | POA: Diagnosis not present

## 2022-07-24 DIAGNOSIS — M1712 Unilateral primary osteoarthritis, left knee: Secondary | ICD-10-CM | POA: Diagnosis not present

## 2022-07-24 LAB — RAD ONC ARIA SESSION SUMMARY
Course Elapsed Days: 7
Plan Fractions Treated to Date: 6
Plan Prescribed Dose Per Fraction: 1.8 Gy
Plan Total Fractions Prescribed: 28
Plan Total Prescribed Dose: 50.4 Gy
Reference Point Dosage Given to Date: 10.8 Gy
Reference Point Session Dosage Given: 1.8 Gy
Session Number: 6

## 2022-07-24 LAB — CBC WITH DIFFERENTIAL (CANCER CENTER ONLY)
Abs Immature Granulocytes: 0.01 10*3/uL (ref 0.00–0.07)
Basophils Absolute: 0 10*3/uL (ref 0.0–0.1)
Basophils Relative: 1 %
Eosinophils Absolute: 0 10*3/uL (ref 0.0–0.5)
Eosinophils Relative: 2 %
HCT: 36.8 % — ABNORMAL LOW (ref 39.0–52.0)
Hemoglobin: 12.8 g/dL — ABNORMAL LOW (ref 13.0–17.0)
Immature Granulocytes: 1 %
Lymphocytes Relative: 37 %
Lymphs Abs: 0.7 10*3/uL (ref 0.7–4.0)
MCH: 31.2 pg (ref 26.0–34.0)
MCHC: 34.8 g/dL (ref 30.0–36.0)
MCV: 89.8 fL (ref 80.0–100.0)
Monocytes Absolute: 0.2 10*3/uL (ref 0.1–1.0)
Monocytes Relative: 11 %
Neutro Abs: 0.9 10*3/uL — ABNORMAL LOW (ref 1.7–7.7)
Neutrophils Relative %: 48 %
Platelet Count: 197 10*3/uL (ref 150–400)
RBC: 4.1 MIL/uL — ABNORMAL LOW (ref 4.22–5.81)
RDW: 14.6 % (ref 11.5–15.5)
WBC Count: 1.8 10*3/uL — ABNORMAL LOW (ref 4.0–10.5)
nRBC: 1.1 % — ABNORMAL HIGH (ref 0.0–0.2)

## 2022-07-24 LAB — CMP (CANCER CENTER ONLY)
ALT: 20 U/L (ref 0–44)
AST: 13 U/L — ABNORMAL LOW (ref 15–41)
Albumin: 3.8 g/dL (ref 3.5–5.0)
Alkaline Phosphatase: 89 U/L (ref 38–126)
Anion gap: 4 — ABNORMAL LOW (ref 5–15)
BUN: 14 mg/dL (ref 8–23)
CO2: 25 mmol/L (ref 22–32)
Calcium: 10.7 mg/dL — ABNORMAL HIGH (ref 8.9–10.3)
Chloride: 97 mmol/L — ABNORMAL LOW (ref 98–111)
Creatinine: 0.84 mg/dL (ref 0.61–1.24)
GFR, Estimated: >60 mL/min (ref >60)
Glucose, Bld: 106 mg/dL — ABNORMAL HIGH (ref 70–99)
Potassium: 3.4 mmol/L — ABNORMAL LOW (ref 3.5–5.1)
Sodium: 126 mmol/L — ABNORMAL LOW (ref 135–145)
Total Bilirubin: 1.3 mg/dL — ABNORMAL HIGH (ref 0.3–1.2)
Total Protein: 6.9 g/dL (ref 6.5–8.1)

## 2022-07-25 ENCOUNTER — Ambulatory Visit: Admission: RE | Admit: 2022-07-25 | Payer: Medicare HMO | Source: Ambulatory Visit

## 2022-07-25 ENCOUNTER — Ambulatory Visit: Payer: Medicare HMO

## 2022-07-25 ENCOUNTER — Telehealth: Payer: Self-pay | Admitting: Radiation Oncology

## 2022-07-25 ENCOUNTER — Ambulatory Visit (HOSPITAL_COMMUNITY): Payer: Medicare HMO

## 2022-07-25 NOTE — Telephone Encounter (Signed)
Received fax that pt Gavin Dennis stating he was having severe abdominal pain and constipation. He was needing to cancel today's appts. Nursing and L4 notified of this message via email.

## 2022-07-26 ENCOUNTER — Ambulatory Visit
Admission: RE | Admit: 2022-07-26 | Discharge: 2022-07-26 | Disposition: A | Discharge status: Home / Self Care | Payer: Medicare HMO | Source: Ambulatory Visit | Attending: Radiation Oncology | Admitting: Radiation Oncology

## 2022-07-26 ENCOUNTER — Other Ambulatory Visit: Payer: Self-pay

## 2022-07-26 DIAGNOSIS — Z5111 Encounter for antineoplastic chemotherapy: Secondary | ICD-10-CM | POA: Diagnosis not present

## 2022-07-26 DIAGNOSIS — C3490 Malignant neoplasm of unspecified part of unspecified bronchus or lung: Secondary | ICD-10-CM | POA: Diagnosis not present

## 2022-07-26 DIAGNOSIS — Z510 Encounter for antineoplastic radiation therapy: Secondary | ICD-10-CM | POA: Diagnosis not present

## 2022-07-26 DIAGNOSIS — C781 Secondary malignant neoplasm of mediastinum: Secondary | ICD-10-CM | POA: Diagnosis not present

## 2022-07-26 DIAGNOSIS — Z87891 Personal history of nicotine dependence: Secondary | ICD-10-CM | POA: Diagnosis not present

## 2022-07-26 DIAGNOSIS — C771 Secondary and unspecified malignant neoplasm of intrathoracic lymph nodes: Secondary | ICD-10-CM | POA: Diagnosis not present

## 2022-07-26 DIAGNOSIS — C801 Malignant (primary) neoplasm, unspecified: Secondary | ICD-10-CM | POA: Diagnosis not present

## 2022-07-26 LAB — RAD ONC ARIA SESSION SUMMARY
Course Elapsed Days: 9
Plan Fractions Treated to Date: 7
Plan Prescribed Dose Per Fraction: 1.8 Gy
Plan Total Fractions Prescribed: 28
Plan Total Prescribed Dose: 50.4 Gy
Reference Point Dosage Given to Date: 12.6 Gy
Reference Point Session Dosage Given: 1.8 Gy
Session Number: 7

## 2022-07-27 ENCOUNTER — Other Ambulatory Visit: Payer: Self-pay

## 2022-07-27 ENCOUNTER — Ambulatory Visit
Admission: RE | Admit: 2022-07-27 | Discharge: 2022-07-27 | Disposition: A | Discharge status: Home / Self Care | Payer: Medicare HMO | Source: Ambulatory Visit | Attending: Radiation Oncology | Admitting: Radiation Oncology

## 2022-07-27 DIAGNOSIS — C771 Secondary and unspecified malignant neoplasm of intrathoracic lymph nodes: Secondary | ICD-10-CM | POA: Diagnosis not present

## 2022-07-27 DIAGNOSIS — Z510 Encounter for antineoplastic radiation therapy: Secondary | ICD-10-CM | POA: Diagnosis not present

## 2022-07-27 DIAGNOSIS — C801 Malignant (primary) neoplasm, unspecified: Secondary | ICD-10-CM | POA: Diagnosis not present

## 2022-07-27 DIAGNOSIS — Z5111 Encounter for antineoplastic chemotherapy: Secondary | ICD-10-CM | POA: Diagnosis not present

## 2022-07-27 DIAGNOSIS — Z87891 Personal history of nicotine dependence: Secondary | ICD-10-CM | POA: Diagnosis not present

## 2022-07-27 DIAGNOSIS — C3490 Malignant neoplasm of unspecified part of unspecified bronchus or lung: Secondary | ICD-10-CM | POA: Diagnosis not present

## 2022-07-27 DIAGNOSIS — C781 Secondary malignant neoplasm of mediastinum: Secondary | ICD-10-CM | POA: Diagnosis not present

## 2022-07-27 LAB — RAD ONC ARIA SESSION SUMMARY
Course Elapsed Days: 10
Plan Fractions Treated to Date: 8
Plan Prescribed Dose Per Fraction: 1.8 Gy
Plan Total Fractions Prescribed: 28
Plan Total Prescribed Dose: 50.4 Gy
Reference Point Dosage Given to Date: 14.4 Gy
Reference Point Session Dosage Given: 1.8 Gy
Session Number: 8

## 2022-07-27 LAB — ACID FAST CULTURE WITH REFLEXED SENSITIVITIES (MYCOBACTERIA): Acid Fast Culture: NEGATIVE

## 2022-07-28 ENCOUNTER — Other Ambulatory Visit: Payer: Self-pay

## 2022-07-28 ENCOUNTER — Ambulatory Visit
Admission: RE | Admit: 2022-07-28 | Discharge: 2022-07-28 | Disposition: A | Discharge status: Home / Self Care | Payer: Medicare HMO | Source: Ambulatory Visit | Attending: Radiation Oncology | Admitting: Radiation Oncology

## 2022-07-28 DIAGNOSIS — Z510 Encounter for antineoplastic radiation therapy: Secondary | ICD-10-CM | POA: Diagnosis not present

## 2022-07-28 DIAGNOSIS — Z87891 Personal history of nicotine dependence: Secondary | ICD-10-CM | POA: Diagnosis not present

## 2022-07-28 DIAGNOSIS — Z5111 Encounter for antineoplastic chemotherapy: Secondary | ICD-10-CM | POA: Diagnosis not present

## 2022-07-28 DIAGNOSIS — C771 Secondary and unspecified malignant neoplasm of intrathoracic lymph nodes: Secondary | ICD-10-CM | POA: Diagnosis not present

## 2022-07-28 DIAGNOSIS — C781 Secondary malignant neoplasm of mediastinum: Secondary | ICD-10-CM | POA: Diagnosis not present

## 2022-07-28 DIAGNOSIS — C3490 Malignant neoplasm of unspecified part of unspecified bronchus or lung: Secondary | ICD-10-CM | POA: Diagnosis not present

## 2022-07-28 DIAGNOSIS — C801 Malignant (primary) neoplasm, unspecified: Secondary | ICD-10-CM | POA: Diagnosis not present

## 2022-07-28 LAB — RAD ONC ARIA SESSION SUMMARY
Course Elapsed Days: 11
Plan Fractions Treated to Date: 9
Plan Prescribed Dose Per Fraction: 1.8 Gy
Plan Total Fractions Prescribed: 28
Plan Total Prescribed Dose: 50.4 Gy
Reference Point Dosage Given to Date: 16.2 Gy
Reference Point Session Dosage Given: 1.8 Gy
Session Number: 9

## 2022-07-28 MED FILL — Dexamethasone Sodium Phosphate Inj 100 MG/10ML: INTRAMUSCULAR | Qty: 1 | Status: AC

## 2022-07-31 ENCOUNTER — Other Ambulatory Visit: Payer: Self-pay

## 2022-07-31 ENCOUNTER — Other Ambulatory Visit: Payer: Self-pay | Admitting: Physician Assistant

## 2022-07-31 ENCOUNTER — Inpatient Hospital Stay: Payer: Medicare HMO

## 2022-07-31 ENCOUNTER — Ambulatory Visit (INDEPENDENT_AMBULATORY_CARE_PROVIDER_SITE_OTHER): Payer: Medicare HMO

## 2022-07-31 ENCOUNTER — Ambulatory Visit
Admission: RE | Admit: 2022-07-31 | Discharge: 2022-07-31 | Disposition: A | Discharge status: Home / Self Care | Payer: Medicare HMO | Source: Ambulatory Visit | Attending: Radiation Oncology | Admitting: Radiation Oncology

## 2022-07-31 VITALS — BP 132/96 | HR 68 | Temp 97.8°F | Resp 16 | Wt 205.2 lb

## 2022-07-31 DIAGNOSIS — C781 Secondary malignant neoplasm of mediastinum: Secondary | ICD-10-CM | POA: Diagnosis not present

## 2022-07-31 DIAGNOSIS — C3490 Malignant neoplasm of unspecified part of unspecified bronchus or lung: Secondary | ICD-10-CM

## 2022-07-31 DIAGNOSIS — I700 Atherosclerosis of aorta: Secondary | ICD-10-CM | POA: Insufficient documentation

## 2022-07-31 DIAGNOSIS — C801 Malignant (primary) neoplasm, unspecified: Secondary | ICD-10-CM | POA: Diagnosis not present

## 2022-07-31 DIAGNOSIS — D701 Agranulocytosis secondary to cancer chemotherapy: Secondary | ICD-10-CM | POA: Insufficient documentation

## 2022-07-31 DIAGNOSIS — Z79899 Other long term (current) drug therapy: Secondary | ICD-10-CM | POA: Insufficient documentation

## 2022-07-31 DIAGNOSIS — I6300 Cerebral infarction due to thrombosis of unspecified precerebral artery: Secondary | ICD-10-CM | POA: Diagnosis not present

## 2022-07-31 DIAGNOSIS — Z510 Encounter for antineoplastic radiation therapy: Secondary | ICD-10-CM | POA: Diagnosis not present

## 2022-07-31 DIAGNOSIS — T451X5A Adverse effect of antineoplastic and immunosuppressive drugs, initial encounter: Secondary | ICD-10-CM | POA: Insufficient documentation

## 2022-07-31 DIAGNOSIS — Z5111 Encounter for antineoplastic chemotherapy: Secondary | ICD-10-CM | POA: Insufficient documentation

## 2022-07-31 DIAGNOSIS — E1169 Type 2 diabetes mellitus with other specified complication: Secondary | ICD-10-CM | POA: Insufficient documentation

## 2022-07-31 DIAGNOSIS — C3491 Malignant neoplasm of unspecified part of right bronchus or lung: Secondary | ICD-10-CM | POA: Diagnosis not present

## 2022-07-31 LAB — CMP (CANCER CENTER ONLY)
ALT: 17 U/L (ref 0–44)
AST: 12 U/L — ABNORMAL LOW (ref 15–41)
Albumin: 3.8 g/dL (ref 3.5–5.0)
Alkaline Phosphatase: 102 U/L (ref 38–126)
Anion gap: 8 (ref 5–15)
BUN: 14 mg/dL (ref 8–23)
CO2: 25 mmol/L (ref 22–32)
Calcium: 10.5 mg/dL — ABNORMAL HIGH (ref 8.9–10.3)
Chloride: 106 mmol/L (ref 98–111)
Creatinine: 0.96 mg/dL (ref 0.61–1.24)
GFR, Estimated: >60 mL/min (ref >60)
Glucose, Bld: 108 mg/dL — ABNORMAL HIGH (ref 70–99)
Potassium: 4 mmol/L (ref 3.5–5.1)
Sodium: 139 mmol/L (ref 135–145)
Total Bilirubin: 0.8 mg/dL (ref 0.3–1.2)
Total Protein: 7.6 g/dL (ref 6.5–8.1)

## 2022-07-31 LAB — CBC WITH DIFFERENTIAL (CANCER CENTER ONLY)
Abs Immature Granulocytes: 0.03 10*3/uL (ref 0.00–0.07)
Basophils Absolute: 0 10*3/uL (ref 0.0–0.1)
Basophils Relative: 1 %
Eosinophils Absolute: 0 10*3/uL (ref 0.0–0.5)
Eosinophils Relative: 1 %
HCT: 38.7 % — ABNORMAL LOW (ref 39.0–52.0)
Hemoglobin: 13.2 g/dL (ref 13.0–17.0)
Immature Granulocytes: 1 %
Lymphocytes Relative: 20 %
Lymphs Abs: 0.6 10*3/uL — ABNORMAL LOW (ref 0.7–4.0)
MCH: 31.1 pg (ref 26.0–34.0)
MCHC: 34.1 g/dL (ref 30.0–36.0)
MCV: 91.1 fL (ref 80.0–100.0)
Monocytes Absolute: 0.7 10*3/uL (ref 0.1–1.0)
Monocytes Relative: 23 %
Neutro Abs: 1.7 10*3/uL (ref 1.7–7.7)
Neutrophils Relative %: 54 %
Platelet Count: 182 10*3/uL (ref 150–400)
RBC: 4.25 MIL/uL (ref 4.22–5.81)
RDW: 14.6 % (ref 11.5–15.5)
WBC Count: 3.1 10*3/uL — ABNORMAL LOW (ref 4.0–10.5)
nRBC: 0 % (ref 0.0–0.2)

## 2022-07-31 LAB — RAD ONC ARIA SESSION SUMMARY
Course Elapsed Days: 14
Plan Fractions Treated to Date: 10
Plan Prescribed Dose Per Fraction: 1.8 Gy
Plan Total Fractions Prescribed: 28
Plan Total Prescribed Dose: 50.4 Gy
Reference Point Dosage Given to Date: 18 Gy
Reference Point Session Dosage Given: 1.8 Gy
Session Number: 10

## 2022-07-31 MED ORDER — SODIUM CHLORIDE 0.9 % IV SOLN
200.0000 mg | Freq: Once | INTRAVENOUS | Status: AC
  Administered 2022-07-31: 200 mg via INTRAVENOUS
  Filled 2022-07-31: qty 20

## 2022-07-31 MED ORDER — SODIUM CHLORIDE 0.9 % IV SOLN
45.0000 mg/m2 | Freq: Once | INTRAVENOUS | Status: AC
  Administered 2022-07-31: 96 mg via INTRAVENOUS
  Filled 2022-07-31: qty 16

## 2022-07-31 MED ORDER — DIPHENHYDRAMINE HCL 50 MG/ML IJ SOLN
50.0000 mg | Freq: Once | INTRAMUSCULAR | Status: AC
  Administered 2022-07-31: 50 mg via INTRAVENOUS
  Filled 2022-07-31: qty 1

## 2022-07-31 MED ORDER — PALONOSETRON HCL INJECTION 0.25 MG/5ML
0.2500 mg | Freq: Once | INTRAVENOUS | Status: AC
  Administered 2022-07-31: 0.25 mg via INTRAVENOUS
  Filled 2022-07-31: qty 5

## 2022-07-31 MED ORDER — FAMOTIDINE IN NACL 20-0.9 MG/50ML-% IV SOLN
20.0000 mg | Freq: Once | INTRAVENOUS | Status: AC
  Administered 2022-07-31: 20 mg via INTRAVENOUS
  Filled 2022-07-31: qty 50

## 2022-07-31 MED ORDER — SODIUM CHLORIDE 0.9 % IV SOLN: Freq: Once | INTRAVENOUS | Status: AC

## 2022-07-31 MED ORDER — SODIUM CHLORIDE 0.9 % IV SOLN
10.0000 mg | Freq: Once | INTRAVENOUS | Status: AC
  Administered 2022-07-31: 10 mg via INTRAVENOUS
  Filled 2022-07-31: qty 10

## 2022-07-31 NOTE — Progress Notes (Signed)
Paclitaxel diluted in NS250mL - Excel bag, Exp:  01/29/2024, [id number].  Raul Del [place], [place], BCPS, BCOP 07/31/2022 2:55 PM

## 2022-07-31 NOTE — Patient Instructions (Addendum)
[facility]  Discharge Instructions: Thank you for choosing [facility] to provide your oncology and hematology care.   If you have a lab appointment with the Cancer Center, please go directly to the Cancer Center and check in at the registration area.   Wear comfortable clothing and clothing appropriate for easy access to any Portacath or PICC line.   We strive to give you quality time with your provider. You may need to reschedule your appointment if you arrive late (15 or more minutes).  Arriving late affects you and other patients whose appointments are after yours.  Also, if you miss three or more appointments without notifying the office, you may be dismissed from the clinic at the provider's discretion.      For prescription refill requests, have your pharmacy contact our office and allow 72 hours for refills to be completed.    Today you received the following chemotherapy and/or immunotherapy agents - Taxol/Carboplatin      To help prevent nausea and vomiting after your treatment, we encourage you to take your nausea medication as directed.  BELOW ARE SYMPTOMS THAT SHOULD BE REPORTED IMMEDIATELY: *FEVER GREATER THAN 100.4 F (38 C) OR HIGHER *CHILLS OR SWEATING *NAUSEA AND VOMITING THAT IS NOT CONTROLLED WITH YOUR NAUSEA MEDICATION *UNUSUAL SHORTNESS OF BREATH *UNUSUAL BRUISING OR BLEEDING *URINARY PROBLEMS (pain or burning when urinating, or frequent urination) *BOWEL PROBLEMS (unusual diarrhea, constipation, pain near the anus) TENDERNESS IN MOUTH AND THROAT WITH OR WITHOUT PRESENCE OF ULCERS (sore throat, sores in mouth, or a toothache) UNUSUAL RASH, SWELLING OR PAIN  UNUSUAL VAGINAL DISCHARGE OR ITCHING   Items with * indicate a potential emergency and should be followed up as soon as possible or go to the Emergency Department if any problems should occur.  Please show the CHEMOTHERAPY ALERT CARD or IMMUNOTHERAPY ALERT CARD  at check-in to the Emergency Department and triage nurse.  Should you have questions after your visit or need to cancel or reschedule your appointment, please contact [facility]  Dept: [phone]  and follow the prompts.  Office hours are 8:00 a.m. to 4:30 p.m. Monday - Friday. Please note that voicemails left after 4:00 p.m. may not be returned until the following business day.  We are closed weekends and major holidays. You have access to a nurse at all times for urgent questions. Please call the main number to the clinic Dept: [phone] and follow the prompts.   For any non-urgent questions, you may also contact your provider using MyChart. We now offer e-Visits for anyone 18 and older to request care online for non-urgent symptoms. For details visit [email or web address].   Also download the MyChart app! Go to the app store, search "MyChart", open the app, select [facility], and log in with your MyChart username and password.  

## 2022-07-31 NOTE — Progress Notes (Signed)
For now maintain carboplatin dose at 200 mg despite improvement in creatinine.  T.O. Dr Mohamed/Padraic Marinos Ronnald Ramp, PharmD

## 2022-08-01 ENCOUNTER — Ambulatory Visit
Admission: RE | Admit: 2022-08-01 | Discharge: 2022-08-01 | Disposition: A | Discharge status: Home / Self Care | Payer: Medicare HMO | Source: Ambulatory Visit | Attending: Radiation Oncology | Admitting: Radiation Oncology

## 2022-08-01 ENCOUNTER — Other Ambulatory Visit: Payer: Self-pay

## 2022-08-01 DIAGNOSIS — Z510 Encounter for antineoplastic radiation therapy: Secondary | ICD-10-CM | POA: Diagnosis not present

## 2022-08-01 DIAGNOSIS — D701 Agranulocytosis secondary to cancer chemotherapy: Secondary | ICD-10-CM | POA: Diagnosis not present

## 2022-08-01 DIAGNOSIS — C3491 Malignant neoplasm of unspecified part of right bronchus or lung: Secondary | ICD-10-CM | POA: Diagnosis not present

## 2022-08-01 DIAGNOSIS — C781 Secondary malignant neoplasm of mediastinum: Secondary | ICD-10-CM | POA: Diagnosis not present

## 2022-08-01 DIAGNOSIS — Z5111 Encounter for antineoplastic chemotherapy: Secondary | ICD-10-CM | POA: Diagnosis not present

## 2022-08-01 DIAGNOSIS — C801 Malignant (primary) neoplasm, unspecified: Secondary | ICD-10-CM | POA: Diagnosis not present

## 2022-08-01 LAB — RAD ONC ARIA SESSION SUMMARY
Course Elapsed Days: 15
Plan Fractions Treated to Date: 11
Plan Prescribed Dose Per Fraction: 1.8 Gy
Plan Total Fractions Prescribed: 28
Plan Total Prescribed Dose: 50.4 Gy
Reference Point Dosage Given to Date: 19.8 Gy
Reference Point Session Dosage Given: 1.8 Gy
Session Number: 11

## 2022-08-01 LAB — CUP PACEART REMOTE DEVICE CHECK
Date Time Interrogation Session: 20240331231844
Implantable Pulse Generator Implant Date: 20231123

## 2022-08-02 ENCOUNTER — Ambulatory Visit
Admission: RE | Admit: 2022-08-02 | Discharge: 2022-08-02 | Disposition: A | Discharge status: Home / Self Care | Payer: Medicare HMO | Source: Ambulatory Visit | Attending: Radiation Oncology | Admitting: Radiation Oncology

## 2022-08-02 ENCOUNTER — Other Ambulatory Visit: Payer: Self-pay

## 2022-08-02 DIAGNOSIS — D701 Agranulocytosis secondary to cancer chemotherapy: Secondary | ICD-10-CM | POA: Diagnosis not present

## 2022-08-02 DIAGNOSIS — C781 Secondary malignant neoplasm of mediastinum: Secondary | ICD-10-CM | POA: Diagnosis not present

## 2022-08-02 DIAGNOSIS — Z510 Encounter for antineoplastic radiation therapy: Secondary | ICD-10-CM | POA: Diagnosis not present

## 2022-08-02 DIAGNOSIS — C3491 Malignant neoplasm of unspecified part of right bronchus or lung: Secondary | ICD-10-CM | POA: Diagnosis not present

## 2022-08-02 DIAGNOSIS — Z5111 Encounter for antineoplastic chemotherapy: Secondary | ICD-10-CM | POA: Diagnosis not present

## 2022-08-02 DIAGNOSIS — C801 Malignant (primary) neoplasm, unspecified: Secondary | ICD-10-CM | POA: Diagnosis not present

## 2022-08-02 LAB — RAD ONC ARIA SESSION SUMMARY
Course Elapsed Days: 16
Plan Fractions Treated to Date: 12
Plan Prescribed Dose Per Fraction: 1.8 Gy
Plan Total Fractions Prescribed: 28
Plan Total Prescribed Dose: 50.4 Gy
Reference Point Dosage Given to Date: 21.6 Gy
Reference Point Session Dosage Given: 1.8 Gy
Session Number: 12

## 2022-08-03 ENCOUNTER — Telehealth: Payer: Self-pay | Admitting: Radiation Oncology

## 2022-08-03 ENCOUNTER — Ambulatory Visit: Payer: Medicare HMO

## 2022-08-03 ENCOUNTER — Telehealth: Payer: Self-pay | Admitting: Internal Medicine

## 2022-08-03 NOTE — Telephone Encounter (Signed)
Pt LVM stating he will not be in today for tx due to stomach problems. Pt states he is seeking medical attention to address this. Message was forwarded to L3 and Dr. Clabe Seal nurse Edwyna Ready.

## 2022-08-03 NOTE — Telephone Encounter (Signed)
Patient called stating he is not feeling, and will not be able to come in for 4/4 [place]. L3 notified.

## 2022-08-03 NOTE — Telephone Encounter (Signed)
Called patient regarding April appointments, patient is notified. 

## 2022-08-04 ENCOUNTER — Telehealth: Payer: Self-pay | Admitting: Radiation Oncology

## 2022-08-04 ENCOUNTER — Other Ambulatory Visit: Payer: Self-pay

## 2022-08-04 ENCOUNTER — Ambulatory Visit: Payer: Medicare HMO

## 2022-08-04 DIAGNOSIS — C3490 Malignant neoplasm of unspecified part of unspecified bronchus or lung: Secondary | ICD-10-CM

## 2022-08-04 MED FILL — Dexamethasone Sodium Phosphate Inj 100 MG/10ML: INTRAMUSCULAR | Qty: 1 | Status: AC

## 2022-08-04 NOTE — Telephone Encounter (Signed)
Patient called stating he is not feeling well and will not be able to come in for TX 4/5. L3 notified.

## 2022-08-04 NOTE — Progress Notes (Signed)
[facility] Summary Report / Loop Recorder 

## 2022-08-07 ENCOUNTER — Inpatient Hospital Stay (HOSPITAL_BASED_OUTPATIENT_CLINIC_OR_DEPARTMENT_OTHER): Payer: Medicare HMO | Admitting: Internal Medicine

## 2022-08-07 ENCOUNTER — Encounter: Payer: Self-pay | Admitting: Medical Oncology

## 2022-08-07 ENCOUNTER — Ambulatory Visit
Admission: RE | Admit: 2022-08-07 | Discharge: 2022-08-07 | Disposition: A | Discharge status: Home / Self Care | Payer: Medicare HMO | Source: Ambulatory Visit | Attending: Radiation Oncology | Admitting: Radiation Oncology

## 2022-08-07 ENCOUNTER — Inpatient Hospital Stay: Payer: Medicare HMO

## 2022-08-07 ENCOUNTER — Other Ambulatory Visit: Payer: Self-pay

## 2022-08-07 VITALS — BP 116/90 | HR 80 | Resp 16

## 2022-08-07 DIAGNOSIS — C3490 Malignant neoplasm of unspecified part of unspecified bronchus or lung: Secondary | ICD-10-CM

## 2022-08-07 DIAGNOSIS — C781 Secondary malignant neoplasm of mediastinum: Secondary | ICD-10-CM | POA: Diagnosis not present

## 2022-08-07 DIAGNOSIS — C801 Malignant (primary) neoplasm, unspecified: Secondary | ICD-10-CM | POA: Diagnosis not present

## 2022-08-07 DIAGNOSIS — Z5111 Encounter for antineoplastic chemotherapy: Secondary | ICD-10-CM | POA: Diagnosis not present

## 2022-08-07 DIAGNOSIS — C3491 Malignant neoplasm of unspecified part of right bronchus or lung: Secondary | ICD-10-CM | POA: Diagnosis not present

## 2022-08-07 DIAGNOSIS — D701 Agranulocytosis secondary to cancer chemotherapy: Secondary | ICD-10-CM | POA: Diagnosis not present

## 2022-08-07 DIAGNOSIS — Z510 Encounter for antineoplastic radiation therapy: Secondary | ICD-10-CM | POA: Diagnosis not present

## 2022-08-07 LAB — CBC WITH DIFFERENTIAL/PLATELET
Abs Immature Granulocytes: 0.02 10*3/uL (ref 0.00–0.07)
Basophils Absolute: 0 10*3/uL (ref 0.0–0.1)
Basophils Relative: 1 %
Eosinophils Absolute: 0 10*3/uL (ref 0.0–0.5)
Eosinophils Relative: 1 %
HCT: 38.1 % — ABNORMAL LOW (ref 39.0–52.0)
Hemoglobin: 13.2 g/dL (ref 13.0–17.0)
Immature Granulocytes: 1 %
Lymphocytes Relative: 16 %
Lymphs Abs: 0.5 10*3/uL — ABNORMAL LOW (ref 0.7–4.0)
MCH: 30.8 pg (ref 26.0–34.0)
MCHC: 34.6 g/dL (ref 30.0–36.0)
MCV: 89 fL (ref 80.0–100.0)
Monocytes Absolute: 0.3 10*3/uL (ref 0.1–1.0)
Monocytes Relative: 10 %
Neutro Abs: 2.1 10*3/uL (ref 1.7–7.7)
Neutrophils Relative %: 71 %
Platelets: 94 10*3/uL — ABNORMAL LOW (ref 150–400)
RBC: 4.28 MIL/uL (ref 4.22–5.81)
RDW: 14.2 % (ref 11.5–15.5)
WBC: 2.9 10*3/uL — ABNORMAL LOW (ref 4.0–10.5)
nRBC: 0 % (ref 0.0–0.2)

## 2022-08-07 LAB — RAD ONC ARIA SESSION SUMMARY
Course Elapsed Days: 21
Plan Fractions Treated to Date: 13
Plan Prescribed Dose Per Fraction: 1.8 Gy
Plan Total Fractions Prescribed: 28
Plan Total Prescribed Dose: 50.4 Gy
Reference Point Dosage Given to Date: 23.4 Gy
Reference Point Session Dosage Given: 1.8 Gy
Session Number: 13

## 2022-08-07 LAB — COMPREHENSIVE METABOLIC PANEL
ALT: 22 U/L (ref 0–44)
AST: 17 U/L (ref 15–41)
Albumin: 3.9 g/dL (ref 3.5–5.0)
Alkaline Phosphatase: 94 U/L (ref 38–126)
Anion gap: 8 (ref 5–15)
BUN: 17 mg/dL (ref 8–23)
CO2: 23 mmol/L (ref 22–32)
Calcium: 10.7 mg/dL — ABNORMAL HIGH (ref 8.9–10.3)
Chloride: 105 mmol/L (ref 98–111)
Creatinine, Ser: 1.17 mg/dL (ref 0.61–1.24)
GFR, Estimated: >60 mL/min (ref >60)
Glucose, Bld: 126 mg/dL — ABNORMAL HIGH (ref 70–99)
Potassium: 4.2 mmol/L (ref 3.5–5.1)
Sodium: 136 mmol/L (ref 135–145)
Total Bilirubin: 1.3 mg/dL — ABNORMAL HIGH (ref 0.3–1.2)
Total Protein: 7.4 g/dL (ref 6.5–8.1)

## 2022-08-07 MED ORDER — SODIUM CHLORIDE 0.9 % IV SOLN: Freq: Once | INTRAVENOUS | Status: AC

## 2022-08-07 MED ORDER — SODIUM CHLORIDE 0.9 % IV SOLN
200.0000 mg | Freq: Once | INTRAVENOUS | Status: AC
  Administered 2022-08-07: 200 mg via INTRAVENOUS
  Filled 2022-08-07: qty 20

## 2022-08-07 MED ORDER — SODIUM CHLORIDE 0.9 % IV SOLN
45.0000 mg/m2 | Freq: Once | INTRAVENOUS | Status: AC
  Administered 2022-08-07: 96 mg via INTRAVENOUS
  Filled 2022-08-07: qty 16

## 2022-08-07 MED ORDER — DIPHENHYDRAMINE HCL 50 MG/ML IJ SOLN
50.0000 mg | Freq: Once | INTRAMUSCULAR | Status: AC
  Administered 2022-08-07: 50 mg via INTRAVENOUS
  Filled 2022-08-07: qty 1

## 2022-08-07 MED ORDER — SODIUM CHLORIDE 0.9 % IV SOLN
10.0000 mg | Freq: Once | INTRAVENOUS | Status: AC
  Administered 2022-08-07: 10 mg via INTRAVENOUS
  Filled 2022-08-07: qty 10

## 2022-08-07 MED ORDER — PALONOSETRON HCL INJECTION 0.25 MG/5ML
0.2500 mg | Freq: Once | INTRAVENOUS | Status: AC
  Administered 2022-08-07: 0.25 mg via INTRAVENOUS
  Filled 2022-08-07: qty 5

## 2022-08-07 MED ORDER — FAMOTIDINE IN NACL 20-0.9 MG/50ML-% IV SOLN
20.0000 mg | Freq: Once | INTRAVENOUS | Status: AC
  Administered 2022-08-07: 20 mg via INTRAVENOUS
  Filled 2022-08-07: qty 50

## 2022-08-07 NOTE — Progress Notes (Signed)
Patient was monitored during treatment. Heart rate was 82-84 during treatment. Patient did not show any s/s of distress. Patient denied any issues after treatment.

## 2022-08-07 NOTE — Progress Notes (Signed)
[name] [phone]   [phone]  OFFICE PROGRESS NOTE  Dennis, Gavin Knudsen, MD [address] [place] [address]  DIAGNOSIS:  Stage IIIb (T0 N2, M0) non-small cell lung cancer, adenocarcinoma presented with right hilar and mediastinal lymphadenopathy with no clear primary diagnosed in February 2024.  Molecular studies by [id number] that showed positive KRAS G12C mutation and PD-L1 expression of 53%.   PRIOR THERAPY: None  CURRENT THERAPY: Concurrent chemoradiation with weekly carboplatin for AUC of 2 and paclitaxel 45 Mg/M2.  First cycle July 10, 2022.  Status post 3 cycles.  INTERVAL HISTORY: Gavin Dennis 72 y.o. male returns to the clinic today for follow-up visit.  The patient is feeling fine today with no concerning complaints except for constipation.  He denied having any current chest pain, shortness of breath, cough or hemoptysis.  He has no nausea, vomiting, diarrhea or abdominal pain.  He has no fever or chills.  He started having some soreness in his throat with the radiation.  He is here today for evaluation before starting cycle #4 of his treatment.  MEDICAL HISTORY: Past Medical History:  Diagnosis Date   Blurred vision, left eye    since stroke 06/2020   CVA (cerebral vascular accident) (HCC) 06/2020   DDD (degenerative disc disease), lumbar    Diabetes mellitus without complication (HCC)    Hyperlipidemia    Hypertension    Hypomagnesemia    Polio    childhood   Vitamin B 12 deficiency     ALLERGIES:  has No Known Allergies.  MEDICATIONS:  Current Outpatient Medications  Medication Sig Dispense Refill   amLODipine (NORVASC) 10 MG tablet Take 1 tablet (10 mg total) by mouth daily. 90 tablet 0   aspirin EC 81 MG tablet Take 81 mg by mouth in the morning. Swallow whole. (Patient not taking: Reported on 06/26/2022)     atorvastatin (LIPITOR) 20 MG tablet Take 20 mg by mouth in the morning.     BREZTRI  AEROSPHERE 160-9-4.8 MCG/ACT AERO Inhale 2 puffs into the lungs daily.     carvedilol (COREG) 6.25 MG tablet Take 1 tablet (6.25 mg total) by mouth 2 (two) times daily. (Patient not taking: Reported on 07/10/2022) 60 tablet 5   clopidogrel (PLAVIX) 75 MG tablet Take 1 tablet (75 mg total) by mouth daily. 30 tablet 3   Cyanocobalamin (VITAMIN B-12 PO) Take 1 tablet by mouth daily.     metFORMIN (GLUCOPHAGE) 500 MG tablet Take 500 mg by mouth in the morning.     metoprolol succinate (TOPROL-XL) 25 MG 24 hr tablet Take 25 mg by mouth daily.     Omega-3 Fatty Acids (OMEGA-3 PO) Take 1 capsule by mouth in the morning. OmegaXL Joint Pain Relief & Inflammation Supplement     omeprazole (PRILOSEC) 40 MG capsule Take 40 mg by mouth daily as needed (indigestion.).     potassium chloride (KLOR-CON) 10 MEQ tablet Take 10 mEq by mouth daily.     prochlorperazine (COMPAZINE) 10 MG tablet Take 1 tablet (10 mg total) by mouth every 6 (six) hours as needed for nausea or vomiting. 30 tablet 0   Study - OCEANIC-STROKE - asundexian 50 mg or placebo tablet (PI-Sethi) Take 1 tablet (50 mg total) by mouth daily. For Investigational Use Only. Take at the same time each day (preferably in the morning). Tablet should be swallowed whole with water; it CANNOT be crushed or broken. (Patient not taking: Reported  on 07/07/2022) 98 tablet 0   No current facility-administered medications for this visit.    SURGICAL HISTORY:  Past Surgical History:  Procedure Laterality Date   BRONCHIAL NEEDLE ASPIRATION BIOPSY  06/12/2022   Procedure: BRONCHIAL NEEDLE ASPIRATION BIOPSIES;  Surgeon: Chilton GreathouseMannam, Praveen, MD;  Location: [facility];  Service: Cardiopulmonary;;   COLONOSCOPY     ENDOBRONCHIAL ULTRASOUND Bilateral 06/12/2022   Procedure: ENDOBRONCHIAL ULTRASOUND;  Surgeon: Chilton GreathouseMannam, Praveen, MD;  Location: [facility];  Service: Cardiopulmonary;  Laterality: Bilateral;   HEMOSTASIS CONTROL  06/12/2022   Procedure: HEMOSTASIS CONTROL;   Surgeon: Chilton GreathouseMannam, Praveen, MD;  Location: [facility];  Service: Cardiopulmonary;;   LOOP RECORDER INSERTION N/A 03/15/2022   Procedure: LOOP RECORDER INSERTION;  Surgeon: Maurice SmallMealor, Augustus E, MD;  Location: [facility];  Service: Cardiovascular;  Laterality: N/A;   VIDEO BRONCHOSCOPY N/A 06/12/2022   Procedure: VIDEO BRONCHOSCOPY WITHOUT FLUORO;  Surgeon: Chilton GreathouseMannam, Praveen, MD;  Location: [facility];  Service: Cardiopulmonary;  Laterality: N/A;    REVIEW OF SYSTEMS:  A comprehensive review of systems was negative except for: Constitutional: positive for fatigue Ears, nose, mouth, throat, and face: positive for sore throat Gastrointestinal: positive for constipation   PHYSICAL EXAMINATION: General appearance: alert, cooperative, fatigued, and no distress Head: Normocephalic, without obvious abnormality, atraumatic Neck: no adenopathy, no JVD, supple, symmetrical, trachea midline, and thyroid not enlarged, symmetric, no tenderness/mass/nodules Lymph nodes: Cervical, supraclavicular, and axillary nodes normal. Resp: clear to auscultation bilaterally Back: symmetric, no curvature. ROM normal. No CVA tenderness. Cardio: regular rate and rhythm, S1, S2 normal, no murmur, click, rub or gallop GI: soft, non-tender; bowel sounds normal; no masses,  no organomegaly Extremities: extremities normal, atraumatic, no cyanosis or edema  ECOG PERFORMANCE STATUS: 1 - Symptomatic but completely ambulatory  Blood pressure (!) 127/91, pulse (!) 111, temperature 98.1 F (36.7 C), temperature source Oral, resp. rate 16, weight 199 lb (90.3 kg), SpO2 99 %.  LABORATORY DATA: Lab Results  Component Value Date   WBC 2.9 (L) 08/07/2022   HGB 13.2 08/07/2022   HCT 38.1 (L) 08/07/2022   MCV 89.0 08/07/2022   PLT 94 (L) 08/07/2022      Chemistry      Component Value Date/Time   NA 139 07/31/2022 1309   NA 140 03/06/2022 1715   K 4.0 07/31/2022 1309   CL 106 07/31/2022 1309   CO2 25 07/31/2022 1309    BUN 14 07/31/2022 1309   BUN 29 (H) 03/06/2022 1715   CREATININE 0.96 07/31/2022 1309      Component Value Date/Time   CALCIUM 10.5 (H) 07/31/2022 1309   ALKPHOS 102 07/31/2022 1309   AST 12 (L) 07/31/2022 1309   ALT 17 07/31/2022 1309   BILITOT 0.8 07/31/2022 1309       RADIOGRAPHIC STUDIES: CUP PACEART REMOTE DEVICE CHECK  Result Date: 08/01/2022 ILR summary report received. Battery status OK. Normal device function. No new symptom,  brady, or pause episodes. No new AF episodes. Monthly summary reports and ROV/PRN 2 tachy events previously reviewed, 15-29sec in duration, HR's 167-188 LA, CVRS   ASSESSMENT AND PLAN: This is a very pleasant 72 years old African-American male with  stage IIIb (T0 N2, M0) non-small cell lung cancer, adenocarcinoma presented with right hilar and mediastinal lymphadenopathy with no clear primary diagnosed in February 2024. His molecular studies by [id number] that showed positive KRAS G12C mutation and PD-L1 expression of 53%. The patient is currently undergoing a course of concurrent chemoradiation with weekly carboplatin for AUC of 2  and paclitaxel 45 Mg/M2 first dose July 10, 2022.  Status post 3 cycles. The patient has been tolerating this treatment well with no concerning adverse effect except for sore throat. I recommended for him to proceed with cycle #4 today as planned. For the constipation he has tried an enema in the past with no improvement.  I recommended for him to use milk of magnesia. I will see him back for follow-up visit in 2 weeks for evaluation before starting cycle #6. The patient was advised to call immediately if he has any concerning symptoms in the interval. The patient voices understanding of current disease status and treatment options and is in agreement with the current care plan.  All questions were answered. The patient knows to call the clinic with any problems, questions or concerns. We can certainly see the patient much  sooner if necessary.  The total time spent in the appointment was 20 minutes.  Disclaimer: This note was dictated with voice recognition software. Similar sounding words can inadvertently be transcribed and may not be corrected upon review.

## 2022-08-07 NOTE — Progress Notes (Signed)
Patient seen by Dr. Gypsy Balsam are not all within treatment parameters. Per Dr. Arbutus Ped ,it is ok to treat pt today  with Carboplatin and taxol and pulse of 111.  Labs reviewed: and are not all within treatment parameters. Per Dr Arbutus Ped ,it is also okay to treat pt today above chemo and a   Platelet count  of 94K.  Per physician team, patient is ready for treatment and there are NO modifications to the treatment plan.

## 2022-08-07 NOTE — Patient Instructions (Signed)
[facility]  Discharge Instructions: Thank you for choosing [facility] to provide your oncology and hematology care.   If you have a lab appointment with the Cancer Center, please go directly to the Cancer Center and check in at the registration area.   Wear comfortable clothing and clothing appropriate for easy access to any Portacath or PICC line.   We strive to give you quality time with your provider. You may need to reschedule your appointment if you arrive late (15 or more minutes).  Arriving late affects you and other patients whose appointments are after yours.  Also, if you miss three or more appointments without notifying the office, you may be dismissed from the clinic at the provider's discretion.      For prescription refill requests, have your pharmacy contact our office and allow 72 hours for refills to be completed.    Today you received the following chemotherapy and/or immunotherapy agents - Taxol/Carboplatin      To help prevent nausea and vomiting after your treatment, we encourage you to take your nausea medication as directed.  BELOW ARE SYMPTOMS THAT SHOULD BE REPORTED IMMEDIATELY: *FEVER GREATER THAN 100.4 F (38 C) OR HIGHER *CHILLS OR SWEATING *NAUSEA AND VOMITING THAT IS NOT CONTROLLED WITH YOUR NAUSEA MEDICATION *UNUSUAL SHORTNESS OF BREATH *UNUSUAL BRUISING OR BLEEDING *URINARY PROBLEMS (pain or burning when urinating, or frequent urination) *BOWEL PROBLEMS (unusual diarrhea, constipation, pain near the anus) TENDERNESS IN MOUTH AND THROAT WITH OR WITHOUT PRESENCE OF ULCERS (sore throat, sores in mouth, or a toothache) UNUSUAL RASH, SWELLING OR PAIN  UNUSUAL VAGINAL DISCHARGE OR ITCHING   Items with * indicate a potential emergency and should be followed up as soon as possible or go to the Emergency Department if any problems should occur.  Please show the CHEMOTHERAPY ALERT CARD or IMMUNOTHERAPY ALERT CARD  at check-in to the Emergency Department and triage nurse.  Should you have questions after your visit or need to cancel or reschedule your appointment, please contact [facility]  Dept: [phone]  and follow the prompts.  Office hours are 8:00 a.m. to 4:30 p.m. Monday - Friday. Please note that voicemails left after 4:00 p.m. may not be returned until the following business day.  We are closed weekends and major holidays. You have access to a nurse at all times for urgent questions. Please call the main number to the clinic Dept: [phone] and follow the prompts.   For any non-urgent questions, you may also contact your provider using MyChart. We now offer e-Visits for anyone 18 and older to request care online for non-urgent symptoms. For details visit [email or web address].   Also download the MyChart app! Go to the app store, search "MyChart", open the app, select [facility], and log in with your MyChart username and password.  

## 2022-08-08 ENCOUNTER — Other Ambulatory Visit: Payer: Self-pay

## 2022-08-08 ENCOUNTER — Ambulatory Visit
Admission: RE | Admit: 2022-08-08 | Discharge: 2022-08-08 | Disposition: A | Discharge status: Home / Self Care | Payer: Medicare HMO | Source: Ambulatory Visit | Attending: Radiation Oncology | Admitting: Radiation Oncology

## 2022-08-08 ENCOUNTER — Other Ambulatory Visit: Payer: Self-pay | Admitting: Radiation Oncology

## 2022-08-08 DIAGNOSIS — C3491 Malignant neoplasm of unspecified part of right bronchus or lung: Secondary | ICD-10-CM | POA: Diagnosis not present

## 2022-08-08 DIAGNOSIS — Z510 Encounter for antineoplastic radiation therapy: Secondary | ICD-10-CM | POA: Diagnosis not present

## 2022-08-08 DIAGNOSIS — D701 Agranulocytosis secondary to cancer chemotherapy: Secondary | ICD-10-CM | POA: Diagnosis not present

## 2022-08-08 DIAGNOSIS — C801 Malignant (primary) neoplasm, unspecified: Secondary | ICD-10-CM | POA: Diagnosis not present

## 2022-08-08 DIAGNOSIS — Z5111 Encounter for antineoplastic chemotherapy: Secondary | ICD-10-CM | POA: Diagnosis not present

## 2022-08-08 DIAGNOSIS — C781 Secondary malignant neoplasm of mediastinum: Secondary | ICD-10-CM | POA: Diagnosis not present

## 2022-08-08 LAB — RAD ONC ARIA SESSION SUMMARY
Course Elapsed Days: 22
Plan Fractions Treated to Date: 14
Plan Prescribed Dose Per Fraction: 1.8 Gy
Plan Total Fractions Prescribed: 28
Plan Total Prescribed Dose: 50.4 Gy
Reference Point Dosage Given to Date: 25.2 Gy
Reference Point Session Dosage Given: 1.8 Gy
Session Number: 14

## 2022-08-08 MED ORDER — SUCRALFATE 1 G PO TABS: 1.0000 g | ORAL_TABLET | Freq: Three times a day (TID) | ORAL | 1 refills | Status: DC

## 2022-08-08 MED ORDER — LIDOCAINE VISCOUS HCL 2 % MT SOLN: 15.0000 mL | OROMUCOSAL | 1 refills | Status: DC | PRN

## 2022-08-09 ENCOUNTER — Ambulatory Visit
Admission: RE | Admit: 2022-08-09 | Discharge: 2022-08-09 | Disposition: A | Discharge status: Home / Self Care | Payer: Medicare HMO | Source: Ambulatory Visit | Attending: Radiation Oncology | Admitting: Radiation Oncology

## 2022-08-09 ENCOUNTER — Other Ambulatory Visit: Payer: Self-pay

## 2022-08-09 DIAGNOSIS — C3491 Malignant neoplasm of unspecified part of right bronchus or lung: Secondary | ICD-10-CM | POA: Diagnosis not present

## 2022-08-09 DIAGNOSIS — C801 Malignant (primary) neoplasm, unspecified: Secondary | ICD-10-CM | POA: Diagnosis not present

## 2022-08-09 DIAGNOSIS — D701 Agranulocytosis secondary to cancer chemotherapy: Secondary | ICD-10-CM | POA: Diagnosis not present

## 2022-08-09 DIAGNOSIS — C781 Secondary malignant neoplasm of mediastinum: Secondary | ICD-10-CM | POA: Diagnosis not present

## 2022-08-09 DIAGNOSIS — Z510 Encounter for antineoplastic radiation therapy: Secondary | ICD-10-CM | POA: Diagnosis not present

## 2022-08-09 DIAGNOSIS — Z5111 Encounter for antineoplastic chemotherapy: Secondary | ICD-10-CM | POA: Diagnosis not present

## 2022-08-09 LAB — RAD ONC ARIA SESSION SUMMARY
Course Elapsed Days: 23
Plan Fractions Treated to Date: 15
Plan Prescribed Dose Per Fraction: 1.8 Gy
Plan Total Fractions Prescribed: 28
Plan Total Prescribed Dose: 50.4 Gy
Reference Point Dosage Given to Date: 27 Gy
Reference Point Session Dosage Given: 1.8 Gy
Session Number: 15

## 2022-08-10 ENCOUNTER — Other Ambulatory Visit: Payer: Self-pay

## 2022-08-10 ENCOUNTER — Ambulatory Visit
Admission: RE | Admit: 2022-08-10 | Discharge: 2022-08-10 | Disposition: A | Discharge status: Home / Self Care | Payer: Medicare HMO | Source: Ambulatory Visit | Attending: Radiation Oncology | Admitting: Radiation Oncology

## 2022-08-10 DIAGNOSIS — Z5111 Encounter for antineoplastic chemotherapy: Secondary | ICD-10-CM | POA: Diagnosis not present

## 2022-08-10 DIAGNOSIS — C3491 Malignant neoplasm of unspecified part of right bronchus or lung: Secondary | ICD-10-CM | POA: Diagnosis not present

## 2022-08-10 DIAGNOSIS — Z510 Encounter for antineoplastic radiation therapy: Secondary | ICD-10-CM | POA: Diagnosis not present

## 2022-08-10 DIAGNOSIS — C781 Secondary malignant neoplasm of mediastinum: Secondary | ICD-10-CM | POA: Diagnosis not present

## 2022-08-10 DIAGNOSIS — D701 Agranulocytosis secondary to cancer chemotherapy: Secondary | ICD-10-CM | POA: Diagnosis not present

## 2022-08-10 DIAGNOSIS — C801 Malignant (primary) neoplasm, unspecified: Secondary | ICD-10-CM | POA: Diagnosis not present

## 2022-08-10 LAB — RAD ONC ARIA SESSION SUMMARY
Course Elapsed Days: 24
Plan Fractions Treated to Date: 16
Plan Prescribed Dose Per Fraction: 1.8 Gy
Plan Total Fractions Prescribed: 28
Plan Total Prescribed Dose: 50.4 Gy
Reference Point Dosage Given to Date: 28.8 Gy
Reference Point Session Dosage Given: 1.8 Gy
Session Number: 16

## 2022-08-11 ENCOUNTER — Other Ambulatory Visit: Payer: Self-pay

## 2022-08-11 ENCOUNTER — Ambulatory Visit
Admission: RE | Admit: 2022-08-11 | Discharge: 2022-08-11 | Disposition: A | Discharge status: Home / Self Care | Payer: Medicare HMO | Source: Ambulatory Visit | Attending: Radiation Oncology | Admitting: Radiation Oncology

## 2022-08-11 DIAGNOSIS — Z510 Encounter for antineoplastic radiation therapy: Secondary | ICD-10-CM | POA: Diagnosis not present

## 2022-08-11 DIAGNOSIS — C801 Malignant (primary) neoplasm, unspecified: Secondary | ICD-10-CM | POA: Diagnosis not present

## 2022-08-11 DIAGNOSIS — Z5111 Encounter for antineoplastic chemotherapy: Secondary | ICD-10-CM | POA: Diagnosis not present

## 2022-08-11 DIAGNOSIS — D701 Agranulocytosis secondary to cancer chemotherapy: Secondary | ICD-10-CM | POA: Diagnosis not present

## 2022-08-11 DIAGNOSIS — C3491 Malignant neoplasm of unspecified part of right bronchus or lung: Secondary | ICD-10-CM | POA: Diagnosis not present

## 2022-08-11 DIAGNOSIS — C781 Secondary malignant neoplasm of mediastinum: Secondary | ICD-10-CM | POA: Diagnosis not present

## 2022-08-11 LAB — RAD ONC ARIA SESSION SUMMARY
Course Elapsed Days: 25
Plan Fractions Treated to Date: 17
Plan Prescribed Dose Per Fraction: 1.8 Gy
Plan Total Fractions Prescribed: 28
Plan Total Prescribed Dose: 50.4 Gy
Reference Point Dosage Given to Date: 30.6 Gy
Reference Point Session Dosage Given: 1.8 Gy
Session Number: 17

## 2022-08-11 MED FILL — Dexamethasone Sodium Phosphate Inj 100 MG/10ML: INTRAMUSCULAR | Qty: 1 | Status: AC

## 2022-08-14 ENCOUNTER — Other Ambulatory Visit: Payer: Self-pay

## 2022-08-14 ENCOUNTER — Telehealth: Payer: Self-pay

## 2022-08-14 ENCOUNTER — Encounter: Payer: Self-pay | Admitting: Medical Oncology

## 2022-08-14 ENCOUNTER — Inpatient Hospital Stay: Payer: Medicare HMO

## 2022-08-14 ENCOUNTER — Other Ambulatory Visit: Payer: Self-pay | Admitting: Internal Medicine

## 2022-08-14 ENCOUNTER — Ambulatory Visit
Admission: RE | Admit: 2022-08-14 | Discharge: 2022-08-14 | Disposition: A | Discharge status: Home / Self Care | Payer: Medicare HMO | Source: Ambulatory Visit | Attending: Radiation Oncology | Admitting: Radiation Oncology

## 2022-08-14 VITALS — BP 125/93 | HR 104 | Temp 97.7°F | Resp 17 | Wt 192.2 lb

## 2022-08-14 DIAGNOSIS — C3490 Malignant neoplasm of unspecified part of unspecified bronchus or lung: Secondary | ICD-10-CM

## 2022-08-14 DIAGNOSIS — D701 Agranulocytosis secondary to cancer chemotherapy: Secondary | ICD-10-CM | POA: Diagnosis not present

## 2022-08-14 DIAGNOSIS — C3491 Malignant neoplasm of unspecified part of right bronchus or lung: Secondary | ICD-10-CM | POA: Diagnosis not present

## 2022-08-14 DIAGNOSIS — Z5111 Encounter for antineoplastic chemotherapy: Secondary | ICD-10-CM | POA: Diagnosis not present

## 2022-08-14 DIAGNOSIS — C801 Malignant (primary) neoplasm, unspecified: Secondary | ICD-10-CM | POA: Diagnosis not present

## 2022-08-14 DIAGNOSIS — C781 Secondary malignant neoplasm of mediastinum: Secondary | ICD-10-CM | POA: Diagnosis not present

## 2022-08-14 DIAGNOSIS — Z510 Encounter for antineoplastic radiation therapy: Secondary | ICD-10-CM | POA: Diagnosis not present

## 2022-08-14 LAB — CBC WITH DIFFERENTIAL (CANCER CENTER ONLY)
Abs Immature Granulocytes: 0.01 10*3/uL (ref 0.00–0.07)
Basophils Absolute: 0 10*3/uL (ref 0.0–0.1)
Basophils Relative: 1 %
Eosinophils Absolute: 0 10*3/uL (ref 0.0–0.5)
Eosinophils Relative: 3 %
HCT: 35.5 % — ABNORMAL LOW (ref 39.0–52.0)
Hemoglobin: 12.2 g/dL — ABNORMAL LOW (ref 13.0–17.0)
Immature Granulocytes: 1 %
Lymphocytes Relative: 30 %
Lymphs Abs: 0.2 10*3/uL — ABNORMAL LOW (ref 0.7–4.0)
MCH: 30.7 pg (ref 26.0–34.0)
MCHC: 34.4 g/dL (ref 30.0–36.0)
MCV: 89.4 fL (ref 80.0–100.0)
Monocytes Absolute: 0.1 10*3/uL (ref 0.1–1.0)
Monocytes Relative: 18 %
Neutro Abs: 0.4 10*3/uL — CL (ref 1.7–7.7)
Neutrophils Relative %: 47 %
Platelet Count: 142 10*3/uL — ABNORMAL LOW (ref 150–400)
RBC: 3.97 MIL/uL — ABNORMAL LOW (ref 4.22–5.81)
RDW: 13.9 % (ref 11.5–15.5)
WBC Count: 0.8 10*3/uL — CL (ref 4.0–10.5)
nRBC: 0 % (ref 0.0–0.2)

## 2022-08-14 LAB — RAD ONC ARIA SESSION SUMMARY
Course Elapsed Days: 28
Plan Fractions Treated to Date: 18
Plan Prescribed Dose Per Fraction: 1.8 Gy
Plan Total Fractions Prescribed: 28
Plan Total Prescribed Dose: 50.4 Gy
Reference Point Dosage Given to Date: 32.4 Gy
Reference Point Session Dosage Given: 1.8 Gy
Session Number: 18

## 2022-08-14 LAB — CMP (CANCER CENTER ONLY)
ALT: 15 U/L (ref 0–44)
AST: 12 U/L — ABNORMAL LOW (ref 15–41)
Albumin: 3.8 g/dL (ref 3.5–5.0)
Alkaline Phosphatase: 95 U/L (ref 38–126)
Anion gap: 7 (ref 5–15)
BUN: 21 mg/dL (ref 8–23)
CO2: 23 mmol/L (ref 22–32)
Calcium: 10.4 mg/dL — ABNORMAL HIGH (ref 8.9–10.3)
Chloride: 106 mmol/L (ref 98–111)
Creatinine: 1.22 mg/dL (ref 0.61–1.24)
GFR, Estimated: >60 mL/min (ref >60)
Glucose, Bld: 116 mg/dL — ABNORMAL HIGH (ref 70–99)
Potassium: 4.4 mmol/L (ref 3.5–5.1)
Sodium: 136 mmol/L (ref 135–145)
Total Bilirubin: 1 mg/dL (ref 0.3–1.2)
Total Protein: 7.3 g/dL (ref 6.5–8.1)

## 2022-08-14 MED ORDER — FILGRASTIM-SNDZ 300 MCG/0.5ML IJ SOSY
300.0000 ug | PREFILLED_SYRINGE | Freq: Once | INTRAMUSCULAR | Status: AC
  Administered 2022-08-14: 300 ug via SUBCUTANEOUS
  Filled 2022-08-14: qty 0.5

## 2022-08-14 NOTE — Progress Notes (Signed)
CRITICAL VALUE STICKER  CRITICAL VALUE: WBC- 0.8 and ANC=0.4  RECEIVER (on-site recipient of call):Gavin Dennis  DATE & TIME NOTIFIED: 08/14/22 @ 1233  MESSENGER (representative from lab):Mindi Junker  MD NOTIFIED: Arbutus Ped  TIME OF NOTIFICATION:1240  RESPONSE:  No chemotherapy today . [facility] requested of insurance.

## 2022-08-14 NOTE — Telephone Encounter (Signed)
RN Victorino Dike reached out to [place] (CMA) at device clinic to seek clarification of pt need for cardiac monitoring during treatment (pt has a loop recorder). Devonne Doughty stated that does not need monitoring due to the fact that is is a loop recorder. RN Victorino Dike sent loop recorder form to device clinic for them to sign and return. Rn will also inform [facility] as well.

## 2022-08-14 NOTE — Patient Instructions (Signed)
Filgrastim Injection What is this medication? FILGRASTIM (fil GRA stim) lowers the risk of infection in people who are receiving chemotherapy. It works by helping your body make more white blood cells, which protects your body from infection. It may also be used to help people who have been exposed to high doses of radiation. It can be used to help prepare your body before a stem cell transplant. It works by helping your bone marrow make and release stem cells into the blood. This medicine may be used for other purposes; ask your health care provider or pharmacist if you have questions. COMMON BRAND NAME(S): Neupogen, Nivestym, Releuko, Zarxio What should I tell my care team before I take this medication? They need to know if you have any of these conditions: History of blood diseases, such as sickle cell anemia Kidney disease Recent or ongoing radiation An unusual or allergic reaction to filgrastim, pegfilgrastim, latex, rubber, other medications, foods, dyes, or preservatives Pregnant or trying to get pregnant Breast-feeding How should I use this medication? This medication is injected under the skin or into a vein. It is usually given by your care team in a hospital or clinic setting. It may be given at home. If you get this medication at home, you will be taught how to prepare and give it. Use exactly as directed. Take it as directed on the prescription label at the same time every day. Keep taking it unless your care team tells you to stop. It is important that you put your used needles and syringes in a special sharps container. Do not put them in a trash can. If you do not have a sharps container, call your pharmacist or care team to get one. This medication comes with INSTRUCTIONS FOR USE. Ask your pharmacist for directions on how to use this medication. Read the information carefully. Talk to your pharmacist or care team if you have questions. Talk to your care team about the use of this  medication in children. While it may be prescribed for children for selected conditions, precautions do apply. Overdosage: If you think you have taken too much of this medicine contact a poison control center or emergency room at once. NOTE: This medicine is only for you. Do not share this medicine with others. What if I miss a dose? It is important not to miss any doses. Talk to your care team about what to do if you miss a dose. What may interact with this medication? Medications that may cause a release of neutrophils, such as lithium This list may not describe all possible interactions. Give your health care provider a list of all the medicines, herbs, non-prescription drugs, or dietary supplements you use. Also tell them if you smoke, drink alcohol, or use illegal drugs. Some items may interact with your medicine. What should I watch for while using this medication? Your condition will be monitored carefully while you are receiving this medication. You may need bloodwork while taking this medication. Talk to your care team about your risk of cancer. You may be more at risk for certain types of cancer if you take this medication. What side effects may I notice from receiving this medication? Side effects that you should report to your care team as soon as possible: Allergic reactions--skin rash, itching, hives, swelling of the face, lips, tongue, or throat Capillary leak syndrome--stomach or muscle pain, unusual weakness or fatigue, feeling faint or lightheaded, decrease in the amount of urine, swelling of the ankles, hands, or   feet, trouble breathing High white blood cell level--fever, fatigue, trouble breathing, night sweats, change in vision, weight loss Inflammation of the aorta--fever, fatigue, back, chest, or stomach pain, severe headache Kidney injury (glomerulonephritis)--decrease in the amount of urine, red or dark brown urine, foamy or bubbly urine, swelling of the ankles, hands, or  feet Shortness of breath or trouble breathing Spleen injury--pain in upper left stomach or shoulder Unusual bruising or bleeding Side effects that usually do not require medical attention (report to your care team if they continue or are bothersome): Back pain Bone pain Fatigue Fever Headache Nausea This list may not describe all possible side effects. Call your doctor for medical advice about side effects. You may report side effects to FDA at 1-800-FDA-1088. Where should I keep my medication? Keep out of the reach of children and pets. Keep this medication in the original packaging until you are ready to take it. Protect from light. See product for storage information. Each product may have different instructions. Get rid of any unused medication after the expiration date. To get rid of medications that are no longer needed or have expired: Take the medication to a medications take-back program. Check with your pharmacy or law enforcement to find a location. If you cannot return the medication, ask your pharmacist or care team how to get rid of this medication safely. NOTE: This sheet is a summary. It may not cover all possible information. If you have questions about this medicine, talk to your doctor, pharmacist, or health care provider.  2023 Elsevier/Gold Standard (2021-07-26 00:00:00)  

## 2022-08-15 ENCOUNTER — Inpatient Hospital Stay: Payer: Medicare HMO

## 2022-08-15 ENCOUNTER — Ambulatory Visit
Admission: RE | Admit: 2022-08-15 | Discharge: 2022-08-15 | Disposition: A | Discharge status: Home / Self Care | Payer: Medicare HMO | Source: Ambulatory Visit | Attending: Radiation Oncology | Admitting: Radiation Oncology

## 2022-08-15 ENCOUNTER — Other Ambulatory Visit: Payer: Self-pay

## 2022-08-15 VITALS — BP 105/90 | HR 102 | Temp 98.3°F | Resp 18

## 2022-08-15 DIAGNOSIS — C3490 Malignant neoplasm of unspecified part of unspecified bronchus or lung: Secondary | ICD-10-CM

## 2022-08-15 DIAGNOSIS — D701 Agranulocytosis secondary to cancer chemotherapy: Secondary | ICD-10-CM | POA: Diagnosis not present

## 2022-08-15 DIAGNOSIS — C3491 Malignant neoplasm of unspecified part of right bronchus or lung: Secondary | ICD-10-CM | POA: Diagnosis not present

## 2022-08-15 DIAGNOSIS — Z510 Encounter for antineoplastic radiation therapy: Secondary | ICD-10-CM | POA: Diagnosis not present

## 2022-08-15 DIAGNOSIS — C781 Secondary malignant neoplasm of mediastinum: Secondary | ICD-10-CM | POA: Diagnosis not present

## 2022-08-15 DIAGNOSIS — C801 Malignant (primary) neoplasm, unspecified: Secondary | ICD-10-CM | POA: Diagnosis not present

## 2022-08-15 DIAGNOSIS — Z5111 Encounter for antineoplastic chemotherapy: Secondary | ICD-10-CM | POA: Diagnosis not present

## 2022-08-15 LAB — RAD ONC ARIA SESSION SUMMARY
Course Elapsed Days: 29
Plan Fractions Treated to Date: 19
Plan Prescribed Dose Per Fraction: 1.8 Gy
Plan Total Fractions Prescribed: 28
Plan Total Prescribed Dose: 50.4 Gy
Reference Point Dosage Given to Date: 34.2 Gy
Reference Point Session Dosage Given: 1.8 Gy
Session Number: 19

## 2022-08-15 MED ORDER — FILGRASTIM-SNDZ 300 MCG/0.5ML IJ SOSY
300.0000 ug | PREFILLED_SYRINGE | Freq: Once | INTRAMUSCULAR | Status: AC
  Administered 2022-08-15: 300 ug via SUBCUTANEOUS
  Filled 2022-08-15: qty 0.5

## 2022-08-15 NOTE — Telephone Encounter (Signed)
Received fax from [facility] stating claim was reprocessed and appeal was approved 06/18/22. Fax states services provided by [facility] is $0 patient responsibility.  Reference # [id number]

## 2022-08-16 ENCOUNTER — Ambulatory Visit
Admission: RE | Admit: 2022-08-16 | Discharge: 2022-08-16 | Disposition: A | Discharge status: Home / Self Care | Payer: Medicare HMO | Source: Ambulatory Visit | Attending: Radiation Oncology | Admitting: Radiation Oncology

## 2022-08-16 ENCOUNTER — Encounter: Payer: Self-pay | Admitting: Cardiovascular Disease

## 2022-08-16 ENCOUNTER — Other Ambulatory Visit: Payer: Self-pay

## 2022-08-16 DIAGNOSIS — C781 Secondary malignant neoplasm of mediastinum: Secondary | ICD-10-CM | POA: Diagnosis not present

## 2022-08-16 DIAGNOSIS — D701 Agranulocytosis secondary to cancer chemotherapy: Secondary | ICD-10-CM | POA: Diagnosis not present

## 2022-08-16 DIAGNOSIS — C3491 Malignant neoplasm of unspecified part of right bronchus or lung: Secondary | ICD-10-CM | POA: Diagnosis not present

## 2022-08-16 DIAGNOSIS — C801 Malignant (primary) neoplasm, unspecified: Secondary | ICD-10-CM | POA: Diagnosis not present

## 2022-08-16 DIAGNOSIS — Z5111 Encounter for antineoplastic chemotherapy: Secondary | ICD-10-CM | POA: Diagnosis not present

## 2022-08-16 DIAGNOSIS — Z510 Encounter for antineoplastic radiation therapy: Secondary | ICD-10-CM | POA: Diagnosis not present

## 2022-08-16 DIAGNOSIS — C3490 Malignant neoplasm of unspecified part of unspecified bronchus or lung: Secondary | ICD-10-CM

## 2022-08-16 LAB — CBC WITH DIFFERENTIAL (CANCER CENTER ONLY)
Abs Immature Granulocytes: 0.09 10*3/uL — ABNORMAL HIGH (ref 0.00–0.07)
Basophils Absolute: 0 10*3/uL (ref 0.0–0.1)
Basophils Relative: 1 %
Eosinophils Absolute: 0 10*3/uL (ref 0.0–0.5)
Eosinophils Relative: 0 %
HCT: 35.8 % — ABNORMAL LOW (ref 39.0–52.0)
Hemoglobin: 12.4 g/dL — ABNORMAL LOW (ref 13.0–17.0)
Immature Granulocytes: 3 %
Lymphocytes Relative: 11 %
Lymphs Abs: 0.3 10*3/uL — ABNORMAL LOW (ref 0.7–4.0)
MCH: 31 pg (ref 26.0–34.0)
MCHC: 34.6 g/dL (ref 30.0–36.0)
MCV: 89.5 fL (ref 80.0–100.0)
Monocytes Absolute: 0.7 10*3/uL (ref 0.1–1.0)
Monocytes Relative: 24 %
Neutro Abs: 1.6 10*3/uL — ABNORMAL LOW (ref 1.7–7.7)
Neutrophils Relative %: 61 %
Platelet Count: 183 10*3/uL (ref 150–400)
RBC: 4 MIL/uL — ABNORMAL LOW (ref 4.22–5.81)
RDW: 14.3 % (ref 11.5–15.5)
Smear Review: NORMAL
WBC Count: 2.7 10*3/uL — ABNORMAL LOW (ref 4.0–10.5)
nRBC: 1.5 % — ABNORMAL HIGH (ref 0.0–0.2)

## 2022-08-16 LAB — RAD ONC ARIA SESSION SUMMARY
Course Elapsed Days: 30
Plan Fractions Treated to Date: 20
Plan Prescribed Dose Per Fraction: 1.8 Gy
Plan Total Fractions Prescribed: 28
Plan Total Prescribed Dose: 50.4 Gy
Reference Point Dosage Given to Date: 36 Gy
Reference Point Session Dosage Given: 1.8 Gy
Session Number: 20

## 2022-08-16 NOTE — Progress Notes (Signed)
Rn has been in contact with device clinic regarding pt loop recorder and pt radiation. Rn received message on 08-15-22 as below:   ----- Message -----  From: Maurice Small, MD  Sent: 08/15/2022   6:01 AM EDT  To: Lenor Coffin, RN  Subject: RE: loop recorder form for pt.                 Yes. Ok to proceed with radiation. The loop recorder can be replaced after radiation therapy is complete, if necessary.   Device clinic to send back form to rad onc to place on pt chart.

## 2022-08-16 NOTE — Progress Notes (Signed)
TO BE COMPLETED BY RADIATION ONCOLOGIST OFFICE:   Patient Name: Gavin Dennis   Date of Birth: 03-Dec-1950   Radiation Oncologist:   Site to be Treated:   Will x-rays >10 MV be used? N/A  Will the radiation be >10 cm from the device? Unknown  Planned Treatment Start Date: 07/17/2022  TO BE COMPLETED BY CARDIOLOGIST OFFICE:   Device Information: Loop Recorder  Brand: Medtronic: [id number] Model #: Medtronic [id number] Reveal LINQ  Serial Number: [id number]     Date of Placement: 03/23/2022  Site of Placement: Left Chest  Remote Device Check--Frequency: Every 31 days   Last Check: 07/30/2022  Is the Patient Pacer Dependent?:N/A  Does cardiologist request Radiation Oncology to schedule device testing by vendor for the following:  Prior to the Initiation of Treatments?  Yes []  No [x]  During Treatments?  Yes []  No [x]  Post Radiation Treatments?  Yes []  No [x]   Is device monitoring necessary by vendor/cardiologist team during treatments?  Yes []   No [x]   Is cardiac monitoring by Radiation Oncology nursing necessary during treatments? Yes []   No [x]   Do you recommend device be relocated prior to Radiation Treatment? Yes []   No [x]   **PLEASE LIST ANY NOTES OR SPECIAL REQUESTS:       CARDIOLOGIST SIGNATURE:  Dr. York Pellant Per Device Clinic Standing Orders, Lenor Coffin  08/16/2022 8:40 AM  **Please route completed form back to Radiation Oncology Nursing and "P [facility] RAD ONC ADMIN", OR send an update if there will be a delay in having form completed by expected start date.  **Call [phone] if you have any questions or do not get an in-basket response from a Radiation Oncology staff member

## 2022-08-17 ENCOUNTER — Ambulatory Visit
Admission: RE | Admit: 2022-08-17 | Discharge: 2022-08-17 | Disposition: A | Discharge status: Home / Self Care | Payer: Medicare HMO | Source: Ambulatory Visit | Attending: Radiation Oncology | Admitting: Radiation Oncology

## 2022-08-17 ENCOUNTER — Other Ambulatory Visit: Payer: Self-pay

## 2022-08-17 DIAGNOSIS — C781 Secondary malignant neoplasm of mediastinum: Secondary | ICD-10-CM | POA: Diagnosis not present

## 2022-08-17 DIAGNOSIS — Z510 Encounter for antineoplastic radiation therapy: Secondary | ICD-10-CM | POA: Diagnosis not present

## 2022-08-17 DIAGNOSIS — C3491 Malignant neoplasm of unspecified part of right bronchus or lung: Secondary | ICD-10-CM | POA: Diagnosis not present

## 2022-08-17 DIAGNOSIS — C801 Malignant (primary) neoplasm, unspecified: Secondary | ICD-10-CM | POA: Diagnosis not present

## 2022-08-17 DIAGNOSIS — D701 Agranulocytosis secondary to cancer chemotherapy: Secondary | ICD-10-CM | POA: Diagnosis not present

## 2022-08-17 DIAGNOSIS — Z5111 Encounter for antineoplastic chemotherapy: Secondary | ICD-10-CM | POA: Diagnosis not present

## 2022-08-17 LAB — RAD ONC ARIA SESSION SUMMARY
Course Elapsed Days: 31
Plan Fractions Treated to Date: 21
Plan Prescribed Dose Per Fraction: 1.8 Gy
Plan Total Fractions Prescribed: 28
Plan Total Prescribed Dose: 50.4 Gy
Reference Point Dosage Given to Date: 37.8 Gy
Reference Point Session Dosage Given: 1.8 Gy
Session Number: 21

## 2022-08-18 ENCOUNTER — Other Ambulatory Visit: Payer: Self-pay

## 2022-08-18 ENCOUNTER — Ambulatory Visit
Admission: RE | Admit: 2022-08-18 | Discharge: 2022-08-18 | Disposition: A | Discharge status: Home / Self Care | Payer: Medicare HMO | Source: Ambulatory Visit | Attending: Radiation Oncology | Admitting: Radiation Oncology

## 2022-08-18 ENCOUNTER — Inpatient Hospital Stay: Payer: Medicare HMO | Admitting: Dietician

## 2022-08-18 DIAGNOSIS — D701 Agranulocytosis secondary to cancer chemotherapy: Secondary | ICD-10-CM | POA: Diagnosis not present

## 2022-08-18 DIAGNOSIS — C781 Secondary malignant neoplasm of mediastinum: Secondary | ICD-10-CM | POA: Diagnosis not present

## 2022-08-18 DIAGNOSIS — C3491 Malignant neoplasm of unspecified part of right bronchus or lung: Secondary | ICD-10-CM | POA: Diagnosis not present

## 2022-08-18 DIAGNOSIS — Z510 Encounter for antineoplastic radiation therapy: Secondary | ICD-10-CM | POA: Diagnosis not present

## 2022-08-18 DIAGNOSIS — Z5111 Encounter for antineoplastic chemotherapy: Secondary | ICD-10-CM | POA: Diagnosis not present

## 2022-08-18 DIAGNOSIS — C801 Malignant (primary) neoplasm, unspecified: Secondary | ICD-10-CM | POA: Diagnosis not present

## 2022-08-18 LAB — RAD ONC ARIA SESSION SUMMARY
Course Elapsed Days: 32
Plan Fractions Treated to Date: 22
Plan Prescribed Dose Per Fraction: 1.8 Gy
Plan Total Fractions Prescribed: 28
Plan Total Prescribed Dose: 50.4 Gy
Reference Point Dosage Given to Date: 39.6 Gy
Reference Point Session Dosage Given: 1.8 Gy
Session Number: 22

## 2022-08-18 MED FILL — Dexamethasone Sodium Phosphate Inj 100 MG/10ML: INTRAMUSCULAR | Qty: 1 | Status: AC

## 2022-08-18 NOTE — Progress Notes (Signed)
Nutrition Follow-up:       Medications: ***  Labs: ***  Anthropometrics:   Height: 5'11" Weight: *** UBW: *** BMI: ***   Estimated Energy Needs  Kcals: *** Protein: *** Fluid: ***  NUTRITION DIAGNOSIS: ***   MALNUTRITION DIAGNOSIS: ***   INTERVENTION: ***    MONITORING, EVALUATION, GOAL: ***   NEXT VISIT: ***  ***

## 2022-08-21 ENCOUNTER — Other Ambulatory Visit: Payer: Self-pay

## 2022-08-21 ENCOUNTER — Encounter: Payer: Self-pay | Admitting: Medical Oncology

## 2022-08-21 ENCOUNTER — Ambulatory Visit: Payer: Medicare HMO

## 2022-08-21 ENCOUNTER — Other Ambulatory Visit: Payer: Self-pay | Admitting: Pharmacist

## 2022-08-21 ENCOUNTER — Inpatient Hospital Stay (HOSPITAL_BASED_OUTPATIENT_CLINIC_OR_DEPARTMENT_OTHER): Payer: Medicare HMO | Admitting: Internal Medicine

## 2022-08-21 ENCOUNTER — Ambulatory Visit
Admission: RE | Admit: 2022-08-21 | Discharge: 2022-08-21 | Disposition: A | Discharge status: Home / Self Care | Payer: Medicare HMO | Source: Ambulatory Visit | Attending: Radiation Oncology | Admitting: Radiation Oncology

## 2022-08-21 ENCOUNTER — Inpatient Hospital Stay: Payer: Medicare HMO

## 2022-08-21 VITALS — BP 117/93 | HR 74

## 2022-08-21 VITALS — BP 135/94 | HR 104 | Resp 16 | Wt 191.8 lb

## 2022-08-21 DIAGNOSIS — C781 Secondary malignant neoplasm of mediastinum: Secondary | ICD-10-CM | POA: Diagnosis not present

## 2022-08-21 DIAGNOSIS — C3491 Malignant neoplasm of unspecified part of right bronchus or lung: Secondary | ICD-10-CM | POA: Diagnosis not present

## 2022-08-21 DIAGNOSIS — C3490 Malignant neoplasm of unspecified part of unspecified bronchus or lung: Secondary | ICD-10-CM

## 2022-08-21 DIAGNOSIS — Z5111 Encounter for antineoplastic chemotherapy: Secondary | ICD-10-CM | POA: Diagnosis not present

## 2022-08-21 DIAGNOSIS — C801 Malignant (primary) neoplasm, unspecified: Secondary | ICD-10-CM | POA: Diagnosis not present

## 2022-08-21 DIAGNOSIS — Z510 Encounter for antineoplastic radiation therapy: Secondary | ICD-10-CM | POA: Diagnosis not present

## 2022-08-21 DIAGNOSIS — D701 Agranulocytosis secondary to cancer chemotherapy: Secondary | ICD-10-CM | POA: Diagnosis not present

## 2022-08-21 LAB — CBC WITH DIFFERENTIAL (CANCER CENTER ONLY)
Abs Immature Granulocytes: 0.22 10*3/uL — ABNORMAL HIGH (ref 0.00–0.07)
Basophils Absolute: 0 10*3/uL (ref 0.0–0.1)
Basophils Relative: 1 %
Eosinophils Absolute: 0 10*3/uL (ref 0.0–0.5)
Eosinophils Relative: 0 %
HCT: 34.7 % — ABNORMAL LOW (ref 39.0–52.0)
Hemoglobin: 12 g/dL — ABNORMAL LOW (ref 13.0–17.0)
Immature Granulocytes: 8 %
Lymphocytes Relative: 9 %
Lymphs Abs: 0.3 10*3/uL — ABNORMAL LOW (ref 0.7–4.0)
MCH: 30.8 pg (ref 26.0–34.0)
MCHC: 34.6 g/dL (ref 30.0–36.0)
MCV: 89 fL (ref 80.0–100.0)
Monocytes Absolute: 0.8 10*3/uL (ref 0.1–1.0)
Monocytes Relative: 28 %
Neutro Abs: 1.6 10*3/uL — ABNORMAL LOW (ref 1.7–7.7)
Neutrophils Relative %: 54 %
Platelet Count: 192 10*3/uL (ref 150–400)
RBC: 3.9 MIL/uL — ABNORMAL LOW (ref 4.22–5.81)
RDW: 14.9 % (ref 11.5–15.5)
Smear Review: NORMAL
WBC Count: 2.9 10*3/uL — ABNORMAL LOW (ref 4.0–10.5)
nRBC: 1 % — ABNORMAL HIGH (ref 0.0–0.2)

## 2022-08-21 LAB — CMP (CANCER CENTER ONLY)
ALT: 16 U/L (ref 0–44)
AST: 16 U/L (ref 15–41)
Albumin: 3.6 g/dL (ref 3.5–5.0)
Alkaline Phosphatase: 104 U/L (ref 38–126)
Anion gap: 8 (ref 5–15)
BUN: 19 mg/dL (ref 8–23)
CO2: 23 mmol/L (ref 22–32)
Calcium: 10.3 mg/dL (ref 8.9–10.3)
Chloride: 108 mmol/L (ref 98–111)
Creatinine: 1.19 mg/dL (ref 0.61–1.24)
GFR, Estimated: >60 mL/min (ref >60)
Glucose, Bld: 118 mg/dL — ABNORMAL HIGH (ref 70–99)
Potassium: 4.4 mmol/L (ref 3.5–5.1)
Sodium: 139 mmol/L (ref 135–145)
Total Bilirubin: 0.7 mg/dL (ref 0.3–1.2)
Total Protein: 7.1 g/dL (ref 6.5–8.1)

## 2022-08-21 LAB — RAD ONC ARIA SESSION SUMMARY
Course Elapsed Days: 35
Plan Fractions Treated to Date: 23
Plan Prescribed Dose Per Fraction: 1.8 Gy
Plan Total Fractions Prescribed: 28
Plan Total Prescribed Dose: 50.4 Gy
Reference Point Dosage Given to Date: 41.4 Gy
Reference Point Session Dosage Given: 1.8 Gy
Session Number: 23

## 2022-08-21 MED ORDER — SODIUM CHLORIDE 0.9 % IV SOLN
45.0000 mg/m2 | Freq: Once | INTRAVENOUS | Status: AC
  Administered 2022-08-21: 96 mg via INTRAVENOUS
  Filled 2022-08-21: qty 16

## 2022-08-21 MED ORDER — DIPHENHYDRAMINE HCL 50 MG/ML IJ SOLN
50.0000 mg | Freq: Once | INTRAMUSCULAR | Status: AC
  Administered 2022-08-21: 50 mg via INTRAVENOUS
  Filled 2022-08-21: qty 1

## 2022-08-21 MED ORDER — PALONOSETRON HCL INJECTION 0.25 MG/5ML
0.2500 mg | Freq: Once | INTRAVENOUS | Status: AC
  Administered 2022-08-21: 0.25 mg via INTRAVENOUS
  Filled 2022-08-21: qty 5

## 2022-08-21 MED ORDER — SODIUM CHLORIDE 0.9 % IV SOLN
10.0000 mg | Freq: Once | INTRAVENOUS | Status: AC
  Administered 2022-08-21: 10 mg via INTRAVENOUS
  Filled 2022-08-21: qty 10

## 2022-08-21 MED ORDER — FAMOTIDINE IN NACL 20-0.9 MG/50ML-% IV SOLN
20.0000 mg | Freq: Once | INTRAVENOUS | Status: AC
  Administered 2022-08-21: 20 mg via INTRAVENOUS
  Filled 2022-08-21: qty 50

## 2022-08-21 MED ORDER — SODIUM CHLORIDE 0.9 % IV SOLN: Freq: Once | INTRAVENOUS | Status: AC

## 2022-08-21 MED ORDER — SODIUM CHLORIDE 0.9 % IV SOLN
205.0000 mg | Freq: Once | INTRAVENOUS | Status: AC
  Administered 2022-08-21: 210 mg via INTRAVENOUS
  Filled 2022-08-21: qty 21

## 2022-08-21 NOTE — Progress Notes (Signed)
[facility] [phone]   Fax:([phone]) [phone]  OFFICE PROGRESS NOTE  Pahwani, [name], MD [address] [place] [address]  DIAGNOSIS:  Stage IIIb (T0 N2, M0) non-small cell lung cancer, adenocarcinoma presented with right hilar and mediastinal lymphadenopathy with no clear primary diagnosed in February 2024.  Molecular studies by [id number] that showed positive KRAS G12C mutation and PD-L1 expression of 53%.   PRIOR THERAPY: None  CURRENT THERAPY: Concurrent chemoradiation with weekly carboplatin for AUC of 2 and paclitaxel 45 Mg/M2.  First cycle July 10, 2022.  Status post 4 cycles.  INTERVAL HISTORY: Gavin Dennis 72 y.o. male returns to the clinic today for follow-up visit.  The patient is feeling fine today with no concerning complaints.  His constipation has improved.  He denied having any current chest pain, shortness of breath, cough or hemoptysis but has mild fatigue.  He denied having any fever or chills.  He has no nausea, vomiting, diarrhea or abdominal pain.  He is here today for evaluation before starting cycle #5 of his treatment.  MEDICAL HISTORY: Past Medical History:  Diagnosis Date   Blurred vision, left eye    since stroke 06/2020   CVA (cerebral vascular accident) (HCC) 06/2020   DDD (degenerative disc disease), lumbar    Diabetes mellitus without complication (HCC)    Hyperlipidemia    Hypertension    Hypomagnesemia    Polio    childhood   Vitamin B 12 deficiency     ALLERGIES:  has No Known Allergies.  MEDICATIONS:  Current Outpatient Medications  Medication Sig Dispense Refill   amLODipine (NORVASC) 10 MG tablet Take 1 tablet (10 mg total) by mouth daily. 90 tablet 0   aspirin EC 81 MG tablet Take 81 mg by mouth in the morning. Swallow whole. (Patient not taking: Reported on 06/26/2022)     atorvastatin (LIPITOR) 20 MG tablet Take 20 mg by mouth in the morning.     BREZTRI AEROSPHERE 160-9-4.8 MCG/ACT  AERO Inhale 2 puffs into the lungs daily.     carvedilol (COREG) 6.25 MG tablet Take 1 tablet (6.25 mg total) by mouth 2 (two) times daily. (Patient not taking: Reported on 07/10/2022) 60 tablet 5   clopidogrel (PLAVIX) 75 MG tablet Take 1 tablet (75 mg total) by mouth daily. 30 tablet 3   Cyanocobalamin (VITAMIN B-12 PO) Take 1 tablet by mouth daily.     lidocaine (XYLOCAINE) 2 % solution Use as directed 15 mLs in the mouth or throat as needed for mouth pain. Swallow 20 mi prior to meals 100 mL 1   metFORMIN (GLUCOPHAGE) 500 MG tablet Take 500 mg by mouth in the morning.     metoprolol succinate (TOPROL-XL) 25 MG 24 hr tablet Take 25 mg by mouth daily.     Omega-3 Fatty Acids (OMEGA-3 PO) Take 1 capsule by mouth in the morning. OmegaXL Joint Pain Relief & Inflammation Supplement     omeprazole (PRILOSEC) 40 MG capsule Take 40 mg by mouth daily as needed (indigestion.).     potassium chloride (KLOR-CON) 10 MEQ tablet Take 10 mEq by mouth daily.     prochlorperazine (COMPAZINE) 10 MG tablet Take 1 tablet (10 mg total) by mouth every 6 (six) hours as needed for nausea or vomiting. 30 tablet 0   Study - OCEANIC-STROKE - asundexian 50 mg or placebo tablet (PI-Sethi) Take 1 tablet (50 mg total) by mouth daily. For Investigational Use Only. Take at  the same time each day (preferably in the morning). Tablet should be swallowed whole with water; it CANNOT be crushed or broken. (Patient not taking: Reported on 07/07/2022) 98 tablet 0   sucralfate (CARAFATE) 1 g tablet Take 1 tablet (1 g total) by mouth 4 (four) times daily -  with meals and at bedtime. Crush and dissolve in 10 mL's of warm water, swallow 30 min prior to meals and bedtime 60 tablet 1   No current facility-administered medications for this visit.    SURGICAL HISTORY:  Past Surgical History:  Procedure Laterality Date   BRONCHIAL NEEDLE ASPIRATION BIOPSY  06/12/2022   Procedure: BRONCHIAL NEEDLE ASPIRATION BIOPSIES;  Surgeon: Chilton Greathouse,  MD;  Location: [facility];  Service: Cardiopulmonary;;   COLONOSCOPY     ENDOBRONCHIAL ULTRASOUND Bilateral 06/12/2022   Procedure: ENDOBRONCHIAL ULTRASOUND;  Surgeon: Chilton Greathouse, MD;  Location: [facility];  Service: Cardiopulmonary;  Laterality: Bilateral;   HEMOSTASIS CONTROL  06/12/2022   Procedure: HEMOSTASIS CONTROL;  Surgeon: Chilton Greathouse, MD;  Location: [facility];  Service: Cardiopulmonary;;   LOOP RECORDER INSERTION N/A 03/15/2022   Procedure: LOOP RECORDER INSERTION;  Surgeon: Maurice Small, MD;  Location: [facility];  Service: Cardiovascular;  Laterality: N/A;   VIDEO BRONCHOSCOPY N/A 06/12/2022   Procedure: VIDEO BRONCHOSCOPY WITHOUT FLUORO;  Surgeon: Chilton Greathouse, MD;  Location: [facility];  Service: Cardiopulmonary;  Laterality: N/A;    REVIEW OF SYSTEMS:  A comprehensive review of systems was negative except for: Constitutional: positive for fatigue Gastrointestinal: positive for constipation   PHYSICAL EXAMINATION: General appearance: alert, cooperative, fatigued, and no distress Head: Normocephalic, without obvious abnormality, atraumatic Neck: no adenopathy, no JVD, supple, symmetrical, trachea midline, and thyroid not enlarged, symmetric, no tenderness/mass/nodules Lymph nodes: Cervical, supraclavicular, and axillary nodes normal. Resp: clear to auscultation bilaterally Back: symmetric, no curvature. ROM normal. No CVA tenderness. Cardio: regular rate and rhythm, S1, S2 normal, no murmur, click, rub or gallop GI: soft, non-tender; bowel sounds normal; no masses,  no organomegaly Extremities: extremities normal, atraumatic, no cyanosis or edema  ECOG PERFORMANCE STATUS: 1 - Symptomatic but completely ambulatory  Blood pressure (!) 135/94, pulse (!) 104, resp. rate 16, weight 191 lb 12.8 oz (87 kg), SpO2 100 %.  LABORATORY DATA: Lab Results  Component Value Date   WBC 2.9 (L) 08/21/2022   HGB 12.0 (L) 08/21/2022   HCT 34.7 (L) 08/21/2022    MCV 89.0 08/21/2022   PLT 192 08/21/2022      Chemistry      Component Value Date/Time   NA 139 08/21/2022 0850   NA 140 03/06/2022 1715   K 4.4 08/21/2022 0850   CL 108 08/21/2022 0850   CO2 23 08/21/2022 0850   BUN 19 08/21/2022 0850   BUN 29 (H) 03/06/2022 1715   CREATININE 1.19 08/21/2022 0850      Component Value Date/Time   CALCIUM 10.3 08/21/2022 0850   ALKPHOS 104 08/21/2022 0850   AST 16 08/21/2022 0850   ALT 16 08/21/2022 0850   BILITOT 0.7 08/21/2022 0850       RADIOGRAPHIC STUDIES: CUP PACEART REMOTE DEVICE CHECK  Result Date: 08/01/2022 ILR summary report received. Battery status OK. Normal device function. No new symptom,  brady, or pause episodes. No new AF episodes. Monthly summary reports and ROV/PRN 2 tachy events previously reviewed, 15-29sec in duration, HR's 167-188 LA, CVRS   ASSESSMENT AND PLAN: This is a very pleasant 72 years old African-American male with  stage IIIb (T0 N2,  M0) non-small cell lung cancer, adenocarcinoma presented with right hilar and mediastinal lymphadenopathy with no clear primary diagnosed in February 2024. His molecular studies by [id number] that showed positive KRAS G12C mutation and PD-L1 expression of 53%. The patient is currently undergoing a course of concurrent chemoradiation with weekly carboplatin for AUC of 2 and paclitaxel 45 Mg/M2 first dose July 10, 2022.  Status post 4 cycles. The patient has been tolerating his treatment well with no concerning adverse effect except for chemotherapy-induced neutropenia and his treatment was held for 1 cycle. I recommended for him to proceed with cycle #5 today as planned.  He is expected to complete the concurrent radiotherapy next week. I will arrange for him to have repeat CT scan of the chest in around 5 weeks with follow-up visit at that time. He was advised to call immediately if he has any other concerning symptoms in the interval. The patient voices understanding of current  disease status and treatment options and is in agreement with the current care plan.  All questions were answered. The patient knows to call the clinic with any problems, questions or concerns. We can certainly see the patient much sooner if necessary.  The total time spent in the appointment was 20 minutes.  Disclaimer: This note was dictated with voice recognition software. Similar sounding words can inadvertently be transcribed and may not be corrected upon review.

## 2022-08-21 NOTE — Progress Notes (Signed)
Patient seen by Dr. Gypsy Balsam are NOT within treatment parameters. Per Dr Arbutus Ped ,it is ok to treat pt today with Taxol and Carboplatin and heart rate of 104.  Labs reviewed: and are within treatment parameters.  Per physician team, patient is ready for treatment and there are NO modifications to the treatment plan.

## 2022-08-21 NOTE — Patient Instructions (Signed)
[facility]  Discharge Instructions: Thank you for choosing [facility] to provide your oncology and hematology care.   If you have a lab appointment with the Cancer Center, please go directly to the Cancer Center and check in at the registration area.   Wear comfortable clothing and clothing appropriate for easy access to any Portacath or PICC line.   We strive to give you quality time with your provider. You may need to reschedule your appointment if you arrive late (15 or more minutes).  Arriving late affects you and other patients whose appointments are after yours.  Also, if you miss three or more appointments without notifying the office, you may be dismissed from the clinic at the provider's discretion.      For prescription refill requests, have your pharmacy contact our office and allow 72 hours for refills to be completed.    Today you received the following chemotherapy and/or immunotherapy agents: Paclitaxel, Carboplatin.       To help prevent nausea and vomiting after your treatment, we encourage you to take your nausea medication as directed.  BELOW ARE SYMPTOMS THAT SHOULD BE REPORTED IMMEDIATELY: *FEVER GREATER THAN 100.4 F (38 C) OR HIGHER *CHILLS OR SWEATING *NAUSEA AND VOMITING THAT IS NOT CONTROLLED WITH YOUR NAUSEA MEDICATION *UNUSUAL SHORTNESS OF BREATH *UNUSUAL BRUISING OR BLEEDING *URINARY PROBLEMS (pain or burning when urinating, or frequent urination) *BOWEL PROBLEMS (unusual diarrhea, constipation, pain near the anus) TENDERNESS IN MOUTH AND THROAT WITH OR WITHOUT PRESENCE OF ULCERS (sore throat, sores in mouth, or a toothache) UNUSUAL RASH, SWELLING OR PAIN  UNUSUAL VAGINAL DISCHARGE OR ITCHING   Items with * indicate a potential emergency and should be followed up as soon as possible or go to the Emergency Department if any problems should occur.  Please show the CHEMOTHERAPY ALERT CARD or IMMUNOTHERAPY  ALERT CARD at check-in to the Emergency Department and triage nurse.  Should you have questions after your visit or need to cancel or reschedule your appointment, please contact [facility]  Dept: [phone]  and follow the prompts.  Office hours are 8:00 a.m. to 4:30 p.m. Monday - Friday. Please note that voicemails left after 4:00 p.m. may not be returned until the following business day.  We are closed weekends and major holidays. You have access to a nurse at all times for urgent questions. Please call the main number to the clinic Dept: [phone] and follow the prompts.   For any non-urgent questions, you may also contact your provider using MyChart. We now offer e-Visits for anyone 18 and older to request care online for non-urgent symptoms. For details visit [email or web address].   Also download the MyChart app! Go to the app store, search "MyChart", open the app, select [facility], and log in with your MyChart username and password.   

## 2022-08-22 ENCOUNTER — Other Ambulatory Visit: Payer: Self-pay

## 2022-08-22 ENCOUNTER — Ambulatory Visit
Admission: RE | Admit: 2022-08-22 | Discharge: 2022-08-22 | Disposition: A | Discharge status: Home / Self Care | Payer: Medicare HMO | Source: Ambulatory Visit | Attending: Radiation Oncology | Admitting: Radiation Oncology

## 2022-08-22 ENCOUNTER — Other Ambulatory Visit: Payer: Self-pay | Admitting: Radiation Oncology

## 2022-08-22 ENCOUNTER — Ambulatory Visit: Payer: Medicare HMO

## 2022-08-22 DIAGNOSIS — D701 Agranulocytosis secondary to cancer chemotherapy: Secondary | ICD-10-CM | POA: Diagnosis not present

## 2022-08-22 DIAGNOSIS — C781 Secondary malignant neoplasm of mediastinum: Secondary | ICD-10-CM | POA: Diagnosis not present

## 2022-08-22 DIAGNOSIS — C801 Malignant (primary) neoplasm, unspecified: Secondary | ICD-10-CM | POA: Diagnosis not present

## 2022-08-22 DIAGNOSIS — Z510 Encounter for antineoplastic radiation therapy: Secondary | ICD-10-CM | POA: Diagnosis not present

## 2022-08-22 DIAGNOSIS — C3491 Malignant neoplasm of unspecified part of right bronchus or lung: Secondary | ICD-10-CM | POA: Diagnosis not present

## 2022-08-22 DIAGNOSIS — Z5111 Encounter for antineoplastic chemotherapy: Secondary | ICD-10-CM | POA: Diagnosis not present

## 2022-08-22 LAB — RAD ONC ARIA SESSION SUMMARY
Course Elapsed Days: 36
Plan Fractions Treated to Date: 24
Plan Prescribed Dose Per Fraction: 1.8 Gy
Plan Total Fractions Prescribed: 28
Plan Total Prescribed Dose: 50.4 Gy
Reference Point Dosage Given to Date: 43.2 Gy
Reference Point Session Dosage Given: 1.8 Gy
Session Number: 24

## 2022-08-22 MED ORDER — LIDOCAINE VISCOUS HCL 2 % MT SOLN: 15.0000 mL | OROMUCOSAL | 1 refills | Status: DC | PRN

## 2022-08-23 ENCOUNTER — Ambulatory Visit: Payer: Medicare HMO

## 2022-08-23 ENCOUNTER — Ambulatory Visit
Admission: RE | Admit: 2022-08-23 | Discharge: 2022-08-23 | Disposition: A | Discharge status: Home / Self Care | Payer: Medicare HMO | Source: Ambulatory Visit | Attending: Radiation Oncology | Admitting: Radiation Oncology

## 2022-08-23 ENCOUNTER — Other Ambulatory Visit: Payer: Self-pay

## 2022-08-23 DIAGNOSIS — Z5111 Encounter for antineoplastic chemotherapy: Secondary | ICD-10-CM | POA: Diagnosis not present

## 2022-08-23 DIAGNOSIS — D701 Agranulocytosis secondary to cancer chemotherapy: Secondary | ICD-10-CM | POA: Diagnosis not present

## 2022-08-23 DIAGNOSIS — C801 Malignant (primary) neoplasm, unspecified: Secondary | ICD-10-CM | POA: Diagnosis not present

## 2022-08-23 DIAGNOSIS — C781 Secondary malignant neoplasm of mediastinum: Secondary | ICD-10-CM | POA: Diagnosis not present

## 2022-08-23 DIAGNOSIS — Z510 Encounter for antineoplastic radiation therapy: Secondary | ICD-10-CM | POA: Diagnosis not present

## 2022-08-23 DIAGNOSIS — C3491 Malignant neoplasm of unspecified part of right bronchus or lung: Secondary | ICD-10-CM | POA: Diagnosis not present

## 2022-08-23 LAB — RAD ONC ARIA SESSION SUMMARY
Course Elapsed Days: 37
Plan Fractions Treated to Date: 25
Plan Prescribed Dose Per Fraction: 1.8 Gy
Plan Total Fractions Prescribed: 28
Plan Total Prescribed Dose: 50.4 Gy
Reference Point Dosage Given to Date: 45 Gy
Reference Point Session Dosage Given: 1.8 Gy
Session Number: 25

## 2022-08-24 ENCOUNTER — Other Ambulatory Visit: Payer: Self-pay

## 2022-08-24 ENCOUNTER — Ambulatory Visit: Payer: Medicare HMO

## 2022-08-24 ENCOUNTER — Ambulatory Visit (HOSPITAL_BASED_OUTPATIENT_CLINIC_OR_DEPARTMENT_OTHER)
Admission: RE | Admit: 2022-08-24 | Discharge: 2022-08-24 | Disposition: A | Discharge status: Home / Self Care | Payer: Medicare HMO | Source: Ambulatory Visit | Attending: Internal Medicine | Admitting: Internal Medicine

## 2022-08-24 ENCOUNTER — Ambulatory Visit
Admission: RE | Admit: 2022-08-24 | Discharge: 2022-08-24 | Disposition: A | Discharge status: Home / Self Care | Payer: Medicare HMO | Source: Ambulatory Visit | Attending: Radiation Oncology | Admitting: Radiation Oncology

## 2022-08-24 ENCOUNTER — Encounter: Payer: Self-pay | Admitting: Internal Medicine

## 2022-08-24 DIAGNOSIS — R0602 Shortness of breath: Secondary | ICD-10-CM | POA: Diagnosis not present

## 2022-08-24 DIAGNOSIS — C781 Secondary malignant neoplasm of mediastinum: Secondary | ICD-10-CM | POA: Diagnosis not present

## 2022-08-24 DIAGNOSIS — E1169 Type 2 diabetes mellitus with other specified complication: Secondary | ICD-10-CM | POA: Insufficient documentation

## 2022-08-24 DIAGNOSIS — J439 Emphysema, unspecified: Secondary | ICD-10-CM | POA: Diagnosis not present

## 2022-08-24 DIAGNOSIS — C3491 Malignant neoplasm of unspecified part of right bronchus or lung: Secondary | ICD-10-CM | POA: Diagnosis not present

## 2022-08-24 DIAGNOSIS — C801 Malignant (primary) neoplasm, unspecified: Secondary | ICD-10-CM | POA: Diagnosis not present

## 2022-08-24 DIAGNOSIS — Z5111 Encounter for antineoplastic chemotherapy: Secondary | ICD-10-CM | POA: Diagnosis not present

## 2022-08-24 DIAGNOSIS — I7121 Aneurysm of the ascending aorta, without rupture: Secondary | ICD-10-CM | POA: Diagnosis not present

## 2022-08-24 DIAGNOSIS — I700 Atherosclerosis of aorta: Secondary | ICD-10-CM | POA: Insufficient documentation

## 2022-08-24 DIAGNOSIS — Z510 Encounter for antineoplastic radiation therapy: Secondary | ICD-10-CM | POA: Diagnosis not present

## 2022-08-24 DIAGNOSIS — D701 Agranulocytosis secondary to cancer chemotherapy: Secondary | ICD-10-CM | POA: Diagnosis not present

## 2022-08-24 LAB — RAD ONC ARIA SESSION SUMMARY
Course Elapsed Days: 38
Plan Fractions Treated to Date: 26
Plan Prescribed Dose Per Fraction: 1.8 Gy
Plan Total Fractions Prescribed: 28
Plan Total Prescribed Dose: 50.4 Gy
Reference Point Dosage Given to Date: 46.8 Gy
Reference Point Session Dosage Given: 1.8 Gy
Session Number: 26

## 2022-08-25 ENCOUNTER — Ambulatory Visit
Admission: RE | Admit: 2022-08-25 | Discharge: 2022-08-25 | Disposition: A | Discharge status: Home / Self Care | Payer: Medicare HMO | Source: Ambulatory Visit | Attending: Radiation Oncology | Admitting: Radiation Oncology

## 2022-08-25 ENCOUNTER — Other Ambulatory Visit: Payer: Self-pay

## 2022-08-25 ENCOUNTER — Ambulatory Visit (INDEPENDENT_AMBULATORY_CARE_PROVIDER_SITE_OTHER): Payer: Medicare HMO | Admitting: Thoracic Surgery (Cardiothoracic Vascular Surgery)

## 2022-08-25 ENCOUNTER — Ambulatory Visit
Admission: RE | Admit: 2022-08-25 | Discharge: 2022-08-25 | Disposition: A | Discharge status: Home / Self Care | Payer: Medicare HMO | Source: Ambulatory Visit | Attending: Thoracic Surgery (Cardiothoracic Vascular Surgery) | Admitting: Thoracic Surgery (Cardiothoracic Vascular Surgery)

## 2022-08-25 ENCOUNTER — Ambulatory Visit: Payer: Medicare HMO

## 2022-08-25 DIAGNOSIS — C801 Malignant (primary) neoplasm, unspecified: Secondary | ICD-10-CM | POA: Diagnosis not present

## 2022-08-25 DIAGNOSIS — I7121 Aneurysm of the ascending aorta, without rupture: Secondary | ICD-10-CM

## 2022-08-25 DIAGNOSIS — C781 Secondary malignant neoplasm of mediastinum: Secondary | ICD-10-CM | POA: Diagnosis not present

## 2022-08-25 DIAGNOSIS — C3491 Malignant neoplasm of unspecified part of right bronchus or lung: Secondary | ICD-10-CM | POA: Diagnosis not present

## 2022-08-25 DIAGNOSIS — R0602 Shortness of breath: Secondary | ICD-10-CM | POA: Diagnosis not present

## 2022-08-25 DIAGNOSIS — Z5111 Encounter for antineoplastic chemotherapy: Secondary | ICD-10-CM | POA: Diagnosis not present

## 2022-08-25 DIAGNOSIS — J439 Emphysema, unspecified: Secondary | ICD-10-CM | POA: Diagnosis not present

## 2022-08-25 DIAGNOSIS — D701 Agranulocytosis secondary to cancer chemotherapy: Secondary | ICD-10-CM | POA: Diagnosis not present

## 2022-08-25 DIAGNOSIS — Z510 Encounter for antineoplastic radiation therapy: Secondary | ICD-10-CM | POA: Diagnosis not present

## 2022-08-25 DIAGNOSIS — I700 Atherosclerosis of aorta: Secondary | ICD-10-CM | POA: Diagnosis not present

## 2022-08-25 LAB — RAD ONC ARIA SESSION SUMMARY
Course Elapsed Days: 39
Plan Fractions Treated to Date: 27
Plan Prescribed Dose Per Fraction: 1.8 Gy
Plan Total Fractions Prescribed: 28
Plan Total Prescribed Dose: 50.4 Gy
Reference Point Dosage Given to Date: 48.6 Gy
Reference Point Session Dosage Given: 1.8 Gy
Session Number: 27

## 2022-08-25 LAB — VAS US ABI WITH/WO TBI
Left ABI: 1.18
Right ABI: 1.13

## 2022-08-25 NOTE — Progress Notes (Signed)
[address]       [place] [address]             [phone]       Patient: Home Provider: Office Consent for Telemedicine visit obtained.  Today's visit was completed via a real-time telehealth (see specific modality noted below). The patient/authorized person provided oral consent at the time of the visit to engage in a telemedicine encounter with the present provider at [facility]. The patient/authorized person was informed of the potential benefits, limitations, and risks of telemedicine. The patient/authorized person expressed understanding that the laws that protect confidentiality also apply to telemedicine. The patient/authorized person acknowledged understanding that telemedicine does not provide emergency services and that he or she would need to call 911 or proceed to the nearest hospital for help if such a need arose.   Total time spent in the clinical discussion 10 minutes.  Telehealth Modality: Phone visit (audio only)  Recent Radiology Findings:   CT Chest Wo Contrast  Result Date: 08/25/2022 CLINICAL DATA:  Ascending aortic aneurysm.  Shortness of breath. EXAM: CT CHEST WITHOUT CONTRAST TECHNIQUE: Multidetector CT imaging of the chest was performed following the standard protocol without IV contrast. RADIATION DOSE REDUCTION: This exam was performed according to the departmental dose-optimization program which includes automated exposure control, adjustment of the mA and/or kV according to patient size and/or use of iterative reconstruction technique. COMPARISON:  CT chest 06/30/2022, PET 04/21/2022 and CT chest 03/13/2022, 07/01/2021. FINDINGS: Cardiovascular: Atherosclerotic calcification of the aorta and coronary arteries. Ascending aorta measures 4.4 cm (5/59), stable dating back to 07/01/2021. Heart size normal. No pericardial effusion. Mediastinum/Nodes: Mediastinal lymph nodes measure up to 1.4 cm in the low right paratracheal station, similar to  minimally decreased in size. Many contain calcification. Known right hilar adenopathy, better evaluated on 06/30/2022. No axillary adenopathy. Air in the esophagus can be seen with dysmotility. Lungs/Pleura: Centrilobular and paraseptal emphysema. Scattered pulmonary parenchymal scarring. Pulmonary nodules measure 4 mm or less in size, as on 07/01/2021 lung cancer screening CT. Interval clearing of previously seen right upper lobe consolidation and ground-glass. Focal bed of emphysema in the left upper lobe (3/53), as on 07/01/2021 but with less of a soft tissue component than on that study. No pleural fluid. Debris is seen in the airway. Upper Abdomen: Visualized portions of the liver, gallbladder and adrenal glands are unremarkable. Stones in the kidneys bilaterally. Multiple low-density lesions in the kidneys, some of which are too small to characterize and others are incompletely imaged. Visualized portions of the spleen, pancreas and stomach are grossly unremarkable. Duodenal diverticula. No upper abdominal adenopathy. Musculoskeletal: Degenerative changes in the spine. No worrisome lytic or sclerotic lesions. IMPRESSION: 1. 4.4 cm ascending aortic aneurysm, stable from baseline 07/01/2021. Recommend annual imaging followup by CTA or MRA. This recommendation follows 2010 ACCF/AHA/AATS/ACR/ASA/SCA/SCAI/SIR/STS/SVM Guidelines for the Diagnosis and Management of Patients with Thoracic Aortic Disease. Circulation. 2010; 121: B147-W295. Aortic aneurysm NOS (ICD10-I71.9). 2. Mediastinal adenopathy is similar to minimally decreased in size and compatible with recently biopsied adenocarcinoma. Right hilar adenopathy is better seen on 06/30/2022. 3. Bilateral renal stones. 4. Aortic atherosclerosis (ICD10-I70.0). Coronary artery calcification. 5.  Emphysema (ICD10-J43.9). Electronically Signed   By: Leanna Battles M.D.   On: 08/25/2022 13:59   VAS [place] ABI WITH/WO TBI  Result Date: 08/24/2022  LOWER EXTREMITY DOPPLER  STUDY Patient Name:  Gavin Dennis  Date of Exam:   08/24/2022 Medical Rec #: [id number]     Accession #:  [id number] Date of Birth: [birth year]     Patient Gender: M Patient Age:   72 years Exam Location:  [facility] Procedure:      VAS [place] ABI WITH/WO TBI Referring Phys: Ardean Larsen --------------------------------------------------------------------------------  Indications: Patient describes leg tightness when walking. He is able to walk              for long distances but due to left knee pain he will stop and rest.              He denies rest pain. Patient had polio as a child which affected              both legs. High Risk Factors: Hypertension, hyperlipidemia, Diabetes, past history of                    smoking, prior CVA.  Comparison Study: None Performing Technologist: Carlos American RVT  Examination Guidelines: A complete evaluation includes at minimum, Doppler waveform signals and systolic blood pressure reading at the level of bilateral brachial, anterior tibial, and posterior tibial arteries, when vessel segments are accessible. Bilateral testing is considered an integral part of a complete examination. Photoelectric Plethysmograph (PPG) waveforms and toe systolic pressure readings are included as required and additional duplex testing as needed. Limited examinations for reoccurring indications may be performed as noted.  ABI Findings: +---------+------------------+-----+---------+--------+ Right    Rt Pressure (mmHg)IndexWaveform Comment  +---------+------------------+-----+---------+--------+ Brachial 121                                      +---------+------------------+-----+---------+--------+ PTA      137               1.13 triphasic         +---------+------------------+-----+---------+--------+ DP       131               1.08 triphasic         +---------+------------------+-----+---------+--------+ Great Toe97                0.80 Normal             +---------+------------------+-----+---------+--------+ +---------+------------------+-----+---------+-------+ Left     Lt Pressure (mmHg)IndexWaveform Comment +---------+------------------+-----+---------+-------+ Brachial 118                                     +---------+------------------+-----+---------+-------+ PTA      143               1.18 triphasic        +---------+------------------+-----+---------+-------+ DP       126               1.04 triphasic        +---------+------------------+-----+---------+-------+ Great Toe110               0.91 Normal           +---------+------------------+-----+---------+-------+ +-------+-----------+-----------+------------+------------+ ABI/TBIToday's ABIToday's TBIPrevious ABIPrevious TBI +-------+-----------+-----------+------------+------------+ Right  1.13       .80                                 +-------+-----------+-----------+------------+------------+ Left   1.18       .91                                 +-------+-----------+-----------+------------+------------+  Summary: Right: Resting right ankle-brachial index is within normal range. The right toe-brachial index is normal. Left: Resting left ankle-brachial index is within normal range. The left toe-brachial index is normal. *See table(s) above for measurements and observations.     Preliminary    CUP PACEART REMOTE DEVICE CHECK  Result Date: 08/01/2022 ILR summary report received. Battery status OK. Normal device function. No new symptom,  brady, or pause episodes. No new AF episodes. Monthly summary reports and ROV/PRN 2 tachy events previously reviewed, 15-29sec in duration, HR's 167-188 LA, CVRS   I had a telephone visit with Gavin Dennis  Assessment:  72yo male with a 4.4 cm ascending aortic aneurysm.  Echocardiogram shows a tricuspid valve without evidence of regurgitation.  We discussed the natural history and and risk factors for growth of  ascending aortic aneurysms.  We covered the importance of smoking cessation, tight blood pressure control, refraining from lifting heavy objects, and avoiding fluoroquinolones.  The patient is aware of signs and symptoms of aortic dissection and when to present to the emergency department.  We will continue surveillance and a repeat CT was ordered for 12 months.  He also has recently been diagnosed with stage IIIb NSCLC for which he is being treated.  We will coordinated scans with medical oncology.  Conall Vangorder Keane Scrape

## 2022-08-28 ENCOUNTER — Ambulatory Visit
Admission: RE | Admit: 2022-08-28 | Discharge: 2022-08-28 | Disposition: A | Discharge status: Home / Self Care | Payer: Medicare HMO | Source: Ambulatory Visit | Attending: Radiation Oncology | Admitting: Radiation Oncology

## 2022-08-28 ENCOUNTER — Other Ambulatory Visit: Payer: Self-pay

## 2022-08-28 DIAGNOSIS — Z510 Encounter for antineoplastic radiation therapy: Secondary | ICD-10-CM | POA: Diagnosis not present

## 2022-08-28 DIAGNOSIS — D701 Agranulocytosis secondary to cancer chemotherapy: Secondary | ICD-10-CM | POA: Diagnosis not present

## 2022-08-28 DIAGNOSIS — C801 Malignant (primary) neoplasm, unspecified: Secondary | ICD-10-CM | POA: Diagnosis not present

## 2022-08-28 DIAGNOSIS — Z5111 Encounter for antineoplastic chemotherapy: Secondary | ICD-10-CM | POA: Diagnosis not present

## 2022-08-28 DIAGNOSIS — C3491 Malignant neoplasm of unspecified part of right bronchus or lung: Secondary | ICD-10-CM | POA: Diagnosis not present

## 2022-08-28 DIAGNOSIS — C781 Secondary malignant neoplasm of mediastinum: Secondary | ICD-10-CM | POA: Diagnosis not present

## 2022-08-28 LAB — RAD ONC ARIA SESSION SUMMARY
Course Elapsed Days: 42
Plan Fractions Treated to Date: 28
Plan Prescribed Dose Per Fraction: 1.8 Gy
Plan Total Fractions Prescribed: 28
Plan Total Prescribed Dose: 50.4 Gy
Reference Point Dosage Given to Date: 50.4 Gy
Reference Point Session Dosage Given: 1.8 Gy
Session Number: 28

## 2022-08-31 NOTE — Radiation Completion Notes (Signed)
Patient Name: Gavin, Dennis MRN: [id number] Date of Birth: Apr 01, 1951 Referring Physician: Si Gaul, M.D. Date of Service: 2022-08-31 Radiation Oncologist: Arnette Schaumann, M.D. [facility]                             RADIATION ONCOLOGY END OF TREATMENT NOTE     Diagnosis: C34.90 Malignant neoplasm of unspecified part of unspecified bronchus or lung Staging on 2022-06-26: Malignant neoplasm of unspecified part of unspecified bronchus or lung (HCC) T=cT0, N=cN2, M=cM0 Intent: Curative     ==========DELIVERED PLANS==========  First Treatment Date: 2022-07-17 - Last Treatment Date: 2022-08-28   Plan Name: Lung_R Site: Mediastinum Technique: IMRT Mode: Photon Dose Per Fraction: 1.8 Gy Prescribed Dose (Delivered / Prescribed): 50.4 Gy / 50.4 Gy Prescribed Fxs (Delivered / Prescribed): 28 / 28     ==========ON TREATMENT VISIT DATES========== 2022-07-18, 2022-07-26, 2022-08-01, 2022-08-08, 2022-08-15, 2022-08-22, 2022-08-28     ==========UPCOMING VISITS==========       ==========APPENDIX - ON TREATMENT VISIT NOTES==========   See weekly On Treatment Notes is Epic for details.

## 2022-09-04 ENCOUNTER — Ambulatory Visit (INDEPENDENT_AMBULATORY_CARE_PROVIDER_SITE_OTHER): Payer: Medicare HMO

## 2022-09-04 DIAGNOSIS — I6300 Cerebral infarction due to thrombosis of unspecified precerebral artery: Secondary | ICD-10-CM | POA: Diagnosis not present

## 2022-09-04 LAB — CUP PACEART REMOTE DEVICE CHECK
Date Time Interrogation Session: 20240505231211
Implantable Pulse Generator Implant Date: 20231123

## 2022-09-05 NOTE — Progress Notes (Signed)
[facility] Summary Report / Loop Recorder 

## 2022-09-08 ENCOUNTER — Ambulatory Visit: Payer: Medicare HMO | Attending: Internal Medicine | Admitting: Internal Medicine

## 2022-09-08 NOTE — Progress Notes (Deleted)
Cardiology Office Note:    Date:  09/08/2022   ID:  Gavin Dennis, DOB 07-Sep-1950, MRN [id number]  PCP:  Ollen Bowl, MD   [facility] Providers Cardiologist:  Maisie Fus, MD     Referring MD: Ollen Bowl, MD   No chief complaint on file. Thoracic Aneurysm  History of Present Illness:    Gavin Dennis is a 72 y.o. male with a hx of CVA L eye blindness, HTN, 4.5 cm ascending aneurysm, former 30 pack year smoker, blood pressures poorly controlled, referral for aneurysm and HTN  Patient was found to have 4.5 cm ascending thoracic aortic aneurysm and was seen by Dr. Cliffton Asters. He has hx of stroke. His blood pressures have been poorly controlled. Noted his blood pressures have been in the past.  He's stopped smoking.  Hx of CVA last year. He takes aspirin.  No claudication  He occasionally gets brief chest pain ("a twinge") and some SOB after having gained some weight. No consistent significant chest pressure or dyspnea on exertion.  No orthopnea , PND or LE edema.   Interim Hx 11/6 No recent hospitalizations. Not very active.He notes at times he is getting CP with activity. It was sharp pain, in the center. It lasted a few seconds. He says his home BP machine is not great. Notes rechecked Bps with PCP are in a better range.   Cardiology studies TTE- normal EF. RVSP 31. No valve dx. Ascending aorta 40 mm  CT Chest 3/32023: Atherosclerotic thoracic aorta with 4.5 cm ascending thoracic aortic aneurysm. + CAC  Interim hx 09/08/2022   Past Medical History:  Diagnosis Date   Blurred vision, left eye    since stroke 06/2020   CVA (cerebral vascular accident) (HCC) 06/2020   DDD (degenerative disc disease), lumbar    Diabetes mellitus without complication (HCC)    Hyperlipidemia    Hypertension    Hypomagnesemia    Polio    childhood   Vitamin B 12 deficiency     Past Surgical History:  Procedure Laterality Date   BRONCHIAL NEEDLE ASPIRATION BIOPSY  06/12/2022    Procedure: BRONCHIAL NEEDLE ASPIRATION BIOPSIES;  Surgeon: Chilton Greathouse, MD;  Location: [facility];  Service: Cardiopulmonary;;   COLONOSCOPY     ENDOBRONCHIAL ULTRASOUND Bilateral 06/12/2022   Procedure: ENDOBRONCHIAL ULTRASOUND;  Surgeon: Chilton Greathouse, MD;  Location: [facility];  Service: Cardiopulmonary;  Laterality: Bilateral;   HEMOSTASIS CONTROL  06/12/2022   Procedure: HEMOSTASIS CONTROL;  Surgeon: Chilton Greathouse, MD;  Location: [facility];  Service: Cardiopulmonary;;   LOOP RECORDER INSERTION N/A 03/15/2022   Procedure: LOOP RECORDER INSERTION;  Surgeon: Maurice Small, MD;  [facility];  Service: Cardiovascular;  Laterality: N/A;   VIDEO BRONCHOSCOPY N/A 06/12/2022   Procedure: VIDEO BRONCHOSCOPY WITHOUT FLUORO;  Surgeon: Chilton Greathouse, MD;  Location: [facility];  Service: Cardiopulmonary;  Laterality: N/A;    Current Medications: No outpatient medications have been marked as taking for the 09/08/22 encounter (Appointment) with Maisie Fus, MD.     Allergies:   Patient has no known allergies.   Social History   Socioeconomic History   Marital status: Single    Spouse name: Not on file   Number of children: 4   Years of education: 6   Highest education level: 6th grade  Occupational History    Comment: retired  Tobacco Use   Smoking status: Former    Packs/day: 1.00    Years: 30.00    Additional  pack years: 0.00    Total pack years: 30.00    Types: Cigarettes    Quit date: 05/02/2019    Years since quitting: 3.3    Passive exposure: Past   Smokeless tobacco: Never  Substance and Sexual Activity   Alcohol use: Not Currently    Comment: quit a mo ago   Drug use: Not Currently    Types: Cocaine    Comment: hx of abuse   Sexual activity: Not on file  Other Topics Concern   Not on file  Social History Narrative   02/02/21 lives alone   Very little caffeine   Social Determinants of Health   Financial Resource Strain: High Risk  (07/29/2021)   Overall Financial Resource Strain (CARDIA)    Difficulty of Paying Living Expenses: Hard  Food Insecurity: No Food Insecurity (07/05/2022)   Hunger Vital Sign    Worried About Running Out of Food in the Last Year: Never true    Ran Out of Food in the Last Year: Never true  Transportation Needs: Unmet Transportation Needs (07/05/2022)   PRAPARE - Transportation    Lack of Transportation (Medical): Yes    Lack of Transportation (Non-Medical): Yes  Physical Activity: Insufficiently Active (07/29/2021)   Exercise Vital Sign    Days of Exercise per Week: 7 days    Minutes of Exercise per Session: 10 min  Stress: No Stress Concern Present (07/29/2021)   [employer] of Occupational Health - Occupational Stress Questionnaire    Feeling of Stress : Not at all  Social Connections: Moderately Integrated (07/29/2021)   Social Connection and Isolation Panel [NHANES]    Frequency of Communication with Friends and Family: More than three times a week    Frequency of Social Gatherings with Friends and Family: More than three times a week    Attends Religious Services: More than 4 times per year    Active Member of [employer] or Organizations: Yes    Attends Engineer, structural: More than 4 times per year    Marital Status: Never married     Family History: The patient's family history includes Heart Problems in his mother; Lung cancer in his brother; Prostate cancer in his father.  ROS:   Please see the history of present illness.     All other systems reviewed and are negative.  EKGs/Labs/Other Studies Reviewed:    The following studies were reviewed today:   EKG:  EKG is  ordered today.  The ekg ordered today demonstrates  08/30/2021-NSR, LVH  Recent Labs: 03/15/2022: Magnesium 1.9 08/21/2022: ALT 16; BUN 19; Creatinine 1.19; Hemoglobin 12.0; Platelet Count 192; Potassium 4.4; Sodium 139  Recent Lipid Panel    Component Value Date/Time   CHOL 66 03/14/2022 0417    TRIG 48 03/14/2022 0417   HDL 23 (L) 03/14/2022 0417   CHOLHDL 2.9 03/14/2022 0417   VLDL 10 03/14/2022 0417   LDLCALC 33 03/14/2022 0417   LDLDIRECT 29 01/19/2022 1220     Risk Assessment/Calculations:           Physical Exam:    VS:    There were no vitals filed for this visit.    Wt Readings from Last 3 Encounters:  08/21/22 191 lb 12.8 oz (87 kg)  08/14/22 192 lb 4 oz (87.2 kg)  08/07/22 199 lb (90.3 kg)     GEN:  Well nourished, well developed in no acute distress HEENT: Normal NECK: No JVD; No carotid bruits LYMPHATICS: No lymphadenopathy CARDIAC: RRR,  no murmurs, rubs, gallops RESPIRATORY:  Clear to auscultation without rales, wheezing or rhonchi  ABDOMEN: Soft, non-tender, non-distended MUSCULOSKELETAL:  No edema; No deformity  SKIN: Warm and dry NEUROLOGIC:  Alert and oriented x 3 PSYCHIATRIC:  Normal affect   ASSESSMENT:    Aortic Aneurysm: 4.5 cm a sending thoracic aortic aneurysm seen on CT.  3/23; Chest CT 08/25/2022 showed 4.4 ascending aneurysm. Can repeat 07/2023  HTN. Blood pressure goal is < 130/80 mmhg  ideally closer to 120s. Added carvedilol prior. Continue norvasc 10 mg daily and HCTz 25 mg daily.  Gave recs for a new BP machine  HLD: continue atorva 40 mg daily. LDL goal < 70 mg/dL. LDL  at goal  PLAN:    In order of problems listed above:   Follow up 1 year       Medication Adjustments/Labs and Tests Ordered: Current medicines are reviewed at length with the patient today.  Concerns regarding medicines are outlined above.  No orders of the defined types were placed in this encounter.  No orders of the defined types were placed in this encounter.   There are no Patient Instructions on file for this visit.   Signed, Maisie Fus, MD  09/08/2022 3:48 PM    [facility]

## 2022-09-11 ENCOUNTER — Encounter: Payer: Self-pay | Admitting: Internal Medicine

## 2022-09-12 ENCOUNTER — Encounter (HOSPITAL_COMMUNITY): Payer: Self-pay | Admitting: Internal Medicine

## 2022-09-12 ENCOUNTER — Other Ambulatory Visit (HOSPITAL_COMMUNITY): Payer: Self-pay | Admitting: Internal Medicine

## 2022-09-12 DIAGNOSIS — I10 Essential (primary) hypertension: Secondary | ICD-10-CM | POA: Diagnosis not present

## 2022-09-12 DIAGNOSIS — S81801S Unspecified open wound, right lower leg, sequela: Secondary | ICD-10-CM | POA: Diagnosis not present

## 2022-09-12 DIAGNOSIS — R1319 Other dysphagia: Secondary | ICD-10-CM | POA: Diagnosis not present

## 2022-09-12 DIAGNOSIS — E119 Type 2 diabetes mellitus without complications: Secondary | ICD-10-CM | POA: Diagnosis not present

## 2022-09-12 DIAGNOSIS — I7121 Aneurysm of the ascending aorta, without rupture: Secondary | ICD-10-CM | POA: Diagnosis not present

## 2022-09-12 DIAGNOSIS — E1169 Type 2 diabetes mellitus with other specified complication: Secondary | ICD-10-CM | POA: Diagnosis not present

## 2022-09-12 DIAGNOSIS — C3490 Malignant neoplasm of unspecified part of unspecified bronchus or lung: Secondary | ICD-10-CM | POA: Diagnosis not present

## 2022-09-12 DIAGNOSIS — E7849 Other hyperlipidemia: Secondary | ICD-10-CM | POA: Diagnosis not present

## 2022-09-12 DIAGNOSIS — J439 Emphysema, unspecified: Secondary | ICD-10-CM | POA: Diagnosis not present

## 2022-09-12 DIAGNOSIS — D61818 Other pancytopenia: Secondary | ICD-10-CM | POA: Diagnosis not present

## 2022-09-14 ENCOUNTER — Ambulatory Visit (HOSPITAL_COMMUNITY)
Admission: RE | Admit: 2022-09-14 | Discharge: 2022-09-14 | Disposition: A | Discharge status: Home / Self Care | Payer: Medicare HMO | Source: Ambulatory Visit | Attending: Internal Medicine | Admitting: Internal Medicine

## 2022-09-14 ENCOUNTER — Other Ambulatory Visit (HOSPITAL_COMMUNITY): Payer: Self-pay | Admitting: Internal Medicine

## 2022-09-14 DIAGNOSIS — R1319 Other dysphagia: Secondary | ICD-10-CM

## 2022-09-14 DIAGNOSIS — K224 Dyskinesia of esophagus: Secondary | ICD-10-CM | POA: Diagnosis not present

## 2022-09-18 ENCOUNTER — Other Ambulatory Visit (HOSPITAL_COMMUNITY): Payer: Medicare HMO

## 2022-09-21 ENCOUNTER — Ambulatory Visit (HOSPITAL_COMMUNITY): Payer: Medicare HMO | Attending: Internal Medicine

## 2022-09-21 ENCOUNTER — Inpatient Hospital Stay: Payer: Medicare HMO | Attending: Radiation Oncology

## 2022-09-21 NOTE — Progress Notes (Signed)
NN contacted pt to see why he had not yet checked in for his labs and CT scan. NN tried pt's number first. It rang once then went to VM. The pts VMB is not set up and NN could not leave VM.  NN tried all the contacts in the pts demographics and every person who answered told this NN that she had the wrong number.

## 2022-09-22 ENCOUNTER — Other Ambulatory Visit: Payer: Medicare HMO

## 2022-09-22 ENCOUNTER — Telehealth: Payer: Self-pay | Admitting: Medical Oncology

## 2022-09-22 NOTE — Telephone Encounter (Signed)
Unable to contact pt. 

## 2022-09-22 NOTE — Telephone Encounter (Signed)
"  This is not son, Dink's number." Phone number deleted.

## 2022-09-22 NOTE — Telephone Encounter (Signed)
Contacted friend Casimiro Needle." I don't have anything to do with Fayrene Fearing anymore." Contact deleted.

## 2022-09-27 ENCOUNTER — Encounter: Payer: Self-pay | Admitting: Radiation Oncology

## 2022-09-27 ENCOUNTER — Inpatient Hospital Stay: Payer: Medicare HMO | Admitting: Internal Medicine

## 2022-09-28 ENCOUNTER — Ambulatory Visit
Admission: RE | Admit: 2022-09-28 | Discharge: 2022-09-28 | Disposition: A | Discharge status: Home / Self Care | Payer: Medicare HMO | Source: Ambulatory Visit | Attending: Radiation Oncology | Admitting: Radiation Oncology

## 2022-09-28 HISTORY — DX: Personal history of irradiation: Z92.3

## 2022-10-03 NOTE — Progress Notes (Signed)
[facility] Summary Report / Loop Recorder 

## 2022-10-09 ENCOUNTER — Ambulatory Visit (INDEPENDENT_AMBULATORY_CARE_PROVIDER_SITE_OTHER): Payer: Medicare HMO

## 2022-10-09 DIAGNOSIS — I6300 Cerebral infarction due to thrombosis of unspecified precerebral artery: Secondary | ICD-10-CM

## 2022-10-09 DIAGNOSIS — R634 Abnormal weight loss: Secondary | ICD-10-CM | POA: Diagnosis not present

## 2022-10-09 DIAGNOSIS — I10 Essential (primary) hypertension: Secondary | ICD-10-CM | POA: Diagnosis not present

## 2022-10-09 DIAGNOSIS — F5101 Primary insomnia: Secondary | ICD-10-CM | POA: Diagnosis not present

## 2022-10-09 DIAGNOSIS — R1319 Other dysphagia: Secondary | ICD-10-CM | POA: Diagnosis not present

## 2022-10-09 DIAGNOSIS — M5136 Other intervertebral disc degeneration, lumbar region: Secondary | ICD-10-CM | POA: Diagnosis not present

## 2022-10-09 DIAGNOSIS — N179 Acute kidney failure, unspecified: Secondary | ICD-10-CM | POA: Diagnosis not present

## 2022-10-09 DIAGNOSIS — E7849 Other hyperlipidemia: Secondary | ICD-10-CM | POA: Diagnosis not present

## 2022-10-09 DIAGNOSIS — J439 Emphysema, unspecified: Secondary | ICD-10-CM | POA: Diagnosis not present

## 2022-10-09 DIAGNOSIS — E119 Type 2 diabetes mellitus without complications: Secondary | ICD-10-CM | POA: Diagnosis not present

## 2022-10-09 DIAGNOSIS — K219 Gastro-esophageal reflux disease without esophagitis: Secondary | ICD-10-CM | POA: Diagnosis not present

## 2022-10-09 LAB — CUP PACEART REMOTE DEVICE CHECK
Date Time Interrogation Session: 20240609231457
Implantable Pulse Generator Implant Date: 20231123

## 2022-10-11 ENCOUNTER — Ambulatory Visit: Payer: Medicare HMO | Attending: Internal Medicine | Admitting: Internal Medicine

## 2022-10-11 ENCOUNTER — Encounter: Payer: Self-pay | Admitting: Internal Medicine

## 2022-10-11 VITALS — BP 98/63 | HR 72 | Ht 71.0 in | Wt 170.0 lb

## 2022-10-11 DIAGNOSIS — I10 Essential (primary) hypertension: Secondary | ICD-10-CM

## 2022-10-11 NOTE — Progress Notes (Signed)
Cardiology Office Note:    Date:  10/11/2022   ID:  Gavin Dennis, DOB 1950/07/20, MRN [id number]  PCP:  Ollen Bowl, MD   [facility] Providers Cardiologist:  Maisie Fus, MD     Referring MD: Ollen Bowl, MD   No chief complaint on file. Thoracic Aneurysm, HTN  History of Present Illness:    Gavin Dennis is a 72 y.o. male with a hx of CVA L eye blindness, HTN, 4.5 cm ascending aneurysm, former 30 pack year smoker, blood pressures poorly controlled, referral for aneurysm and HTN  Patient was found to have 4.5 cm ascending thoracic aortic aneurysm and was seen by Dr. Cliffton Asters. He has hx of stroke. His blood pressures have been poorly controlled. Noted his blood pressures have been in the past.  He's stopped smoking.  Hx of CVA last year. He takes aspirin.  No claudication  He occasionally gets brief chest pain ("a twinge") and some SOB after having gained some weight. No consistent significant chest pressure or dyspnea on exertion.  No orthopnea , PND or LE edema.   Interim Hx 11/6 No recent hospitalizations. Not very active.He notes at times he is getting CP with activity. It was sharp pain, in the center. It lasted a few seconds. He says his home BP machine is not great. Notes rechecked Bps with PCP are in a better range.   Interim hx 10/11/2022 Having some back pain. He denies CP or SOB. His BP 98/63 mmHg. No LH or syncope.   Past Medical History:  Diagnosis Date   Blurred vision, left eye    since stroke 06/2020   CVA (cerebral vascular accident) (HCC) 06/2020   DDD (degenerative disc disease), lumbar    Diabetes mellitus without complication (HCC)    History of radiation therapy    Right lung- 07/17/22-08/28/22- Dr. Antony Blackbird   Hyperlipidemia    Hypertension    Hypomagnesemia    Polio    childhood   Vitamin B 12 deficiency     Past Surgical History:  Procedure Laterality Date   BRONCHIAL NEEDLE ASPIRATION BIOPSY  06/12/2022   Procedure: BRONCHIAL  NEEDLE ASPIRATION BIOPSIES;  Surgeon: Chilton Greathouse, MD;  Location: [facility];  Service: Cardiopulmonary;;   COLONOSCOPY     ENDOBRONCHIAL ULTRASOUND Bilateral 06/12/2022   Procedure: ENDOBRONCHIAL ULTRASOUND;  Surgeon: Chilton Greathouse, MD;  Location: [facility];  Service: Cardiopulmonary;  Laterality: Bilateral;   HEMOSTASIS CONTROL  06/12/2022   Procedure: HEMOSTASIS CONTROL;  Surgeon: Chilton Greathouse, MD;  Location: [facility];  Service: Cardiopulmonary;;   LOOP RECORDER INSERTION N/A 03/15/2022   Procedure: LOOP RECORDER INSERTION;  Surgeon: Maurice Small, MD;  [facility]: [facility];  Service: Cardiovascular;  Laterality: N/A;   VIDEO BRONCHOSCOPY N/A 06/12/2022   Procedure: VIDEO BRONCHOSCOPY WITHOUT FLUORO;  Surgeon: Chilton Greathouse, MD;  Location: [facility];  Service: Cardiopulmonary;  Laterality: N/A;    Current Medications: Current Outpatient Medications on File Prior to Visit  Medication Sig Dispense Refill   amLODipine (NORVASC) 10 MG tablet Take 1 tablet (10 mg total) by mouth daily. 90 tablet 0   atorvastatin (LIPITOR) 20 MG tablet Take 20 mg by mouth in the morning.     BREZTRI AEROSPHERE 160-9-4.8 MCG/ACT AERO Inhale 2 puffs into the lungs daily.     clopidogrel (PLAVIX) 75 MG tablet Take 1 tablet (75 mg total) by mouth daily. 30 tablet 3   Cyanocobalamin (VITAMIN B-12 PO) Take 1 tablet by mouth daily.  lidocaine (XYLOCAINE) 2 % solution Use as directed 15 mLs in the mouth or throat as needed for mouth pain. Swallow 20 mi prior to meals 100 mL 1   metFORMIN (GLUCOPHAGE) 500 MG tablet Take 500 mg by mouth in the morning.     metoprolol succinate (TOPROL-XL) 25 MG 24 hr tablet Take 25 mg by mouth daily.     Omega-3 Fatty Acids (OMEGA-3 PO) Take 1 capsule by mouth in the morning. OmegaXL Joint Pain Relief & Inflammation Supplement     omeprazole (PRILOSEC) 40 MG capsule Take 40 mg by mouth daily as needed (indigestion.).     potassium chloride (KLOR-CON)  10 MEQ tablet Take 10 mEq by mouth daily.     prochlorperazine (COMPAZINE) 10 MG tablet Take 1 tablet (10 mg total) by mouth every 6 (six) hours as needed for nausea or vomiting. 30 tablet 0   Study - OCEANIC-STROKE - asundexian 50 mg or placebo tablet (PI-Sethi) Take 1 tablet (50 mg total) by mouth daily. For Investigational Use Only. Take at the same time each day (preferably in the morning). Tablet should be swallowed whole with water; it CANNOT be crushed or broken. (Patient not taking: Reported on 07/07/2022) 98 tablet 0   sucralfate (CARAFATE) 1 g tablet Take 1 tablet (1 g total) by mouth 4 (four) times daily -  with meals and at bedtime. Crush and dissolve in 10 mL's of warm water, swallow 30 min prior to meals and bedtime 60 tablet 1   No current facility-administered medications on file prior to visit.     Allergies:   Patient has no known allergies.   Social History   Socioeconomic History   Marital status: Single    Spouse name: Not on file   Number of children: 4   Years of education: 6   Highest education level: 6th grade  Occupational History    Comment: retired  Tobacco Use   Smoking status: Former    Packs/day: 1.00    Years: 30.00    Additional pack years: 0.00    Total pack years: 30.00    Types: Cigarettes    Quit date: 05/02/2019    Years since quitting: 3.4    Passive exposure: Past   Smokeless tobacco: Never  Substance and Sexual Activity   Alcohol use: Not Currently    Comment: quit a mo ago   Drug use: Not Currently    Types: Cocaine    Comment: hx of abuse   Sexual activity: Not on file  Other Topics Concern   Not on file  Social History Narrative   02/02/21 lives alone   Very little caffeine   Social Determinants of Health   Financial Resource Strain: High Risk (07/29/2021)   Overall Financial Resource Strain (CARDIA)    Difficulty of Paying Living Expenses: Hard  Food Insecurity: No Food Insecurity (07/05/2022)   Hunger Vital Sign    Worried  About Running Out of Food in the Last Year: Never true    Ran Out of Food in the Last Year: Never true  Transportation Needs: Unmet Transportation Needs (07/05/2022)   PRAPARE - Transportation    Lack of Transportation (Medical): Yes    Lack of Transportation (Non-Medical): Yes  Physical Activity: Insufficiently Active (07/29/2021)   Exercise Vital Sign    Days of Exercise per Week: 7 days    Minutes of Exercise per Session: 10 min  Stress: No Stress Concern Present (07/29/2021)   [employer] of Occupational Health -  Occupational Stress Questionnaire    Feeling of Stress : Not at all  Social Connections: Moderately Integrated (07/29/2021)   Social Connection and Isolation Panel [NHANES]    Frequency of Communication with Friends and Family: More than three times a week    Frequency of Social Gatherings with Friends and Family: More than three times a week    Attends Religious Services: More than 4 times per year    Active Member of [employer] or Organizations: Yes    Attends Engineer, structural: More than 4 times per year    Marital Status: Never married     Family History: The patient's family history includes Heart Problems in his mother; Lung cancer in his brother; Prostate cancer in his father.  ROS:   Please see the history of present illness.     All other systems reviewed and are negative.  EKGs/Labs/Other Studies Reviewed:    The following studies were reviewed today:  TTE- normal EF. RVSP 31. No valve dx. Ascending aorta 40 mm  CT Chest 3/32023: Atherosclerotic thoracic aorta with 4.5 cm ascending thoracic aortic aneurysm. + CAC  CT chest w/o contrast 08/25/2022: 4.4 cm ascending aortic aneurysm, stable from baseline 07/01/2021. Recommend annual imaging followup by CTA or MRA.  EKG:  EKG is  ordered today.  The ekg ordered today demonstrates  08/30/2021-NSR, LVH  10/11/2022- NSR  Recent Labs: 03/15/2022: Magnesium 1.9 08/21/2022: ALT 16; BUN 19; Creatinine  1.19; Hemoglobin 12.0; Platelet Count 192; Potassium 4.4; Sodium 139  Recent Lipid Panel    Component Value Date/Time   CHOL 66 03/14/2022 0417   TRIG 48 03/14/2022 0417   HDL 23 (L) 03/14/2022 0417   CHOLHDL 2.9 03/14/2022 0417   VLDL 10 03/14/2022 0417   LDLCALC 33 03/14/2022 0417   LDLDIRECT 29 01/19/2022 1220     Risk Assessment/Calculations:           Physical Exam:    VS:    Vitals:   10/11/22 1527  BP: 98/63  Pulse: 72  SpO2: 94%      Wt Readings from Last 3 Encounters:  10/11/22 170 lb (77.1 kg)  08/21/22 191 lb 12.8 oz (87 kg)  08/14/22 192 lb 4 oz (87.2 kg)     GEN:  Well nourished, well developed in no acute distress HEENT: Normal NECK: No JVD; No carotid bruits LYMPHATICS: No lymphadenopathy CARDIAC: RRR, no murmurs, rubs, gallops RESPIRATORY:  Clear to auscultation without rales, wheezing or rhonchi  ABDOMEN: Soft, non-tender, non-distended MUSCULOSKELETAL:  No edema; No deformity  SKIN: Warm and dry NEUROLOGIC:  Alert and oriented x 3 PSYCHIATRIC:  Normal affect   ASSESSMENT:    HTN: well controlled. Blood pressure goal is < 130/80 mmhg  ideally closer to 120s. It is at goal. Continue metoprolol succinate 25 mg XL daily. Continue norvasc 10 mg daily  Prior CVA: on plavix 75 mg daily. continue atorva 20 mg daily. LDL goal < 70 mg/dL. LDL at goal.   Aortic Aneurysm: 4.4 cm stable. Next 07/2023. Followed by Dr. Cliffton Asters  PLAN:    In order of problems listed above:   Follow up 1 year      Medication Adjustments/Labs and Tests Ordered: Current medicines are reviewed at length with the patient today.  Concerns regarding medicines are outlined above.  Orders Placed This Encounter  Procedures   EKG 12-Lead   No orders of the defined types were placed in this encounter.   Patient Instructions  Medication Instructions:  No changes   *If you need a refill on your cardiac medications before your next appointment, please call your  pharmacy*   Lab Work:  Not neededd     Testing/Procedures:  Not needed   Follow-Up: At [facility], you and your health needs are our priority.  As part of our continuing mission to provide you with exceptional heart care, we have created designated Provider Care Teams.  These Care Teams include your primary Cardiologist (physician) and Advanced Practice Providers (APPs -  Physician Assistants and Nurse Practitioners) who all work together to provide you with the care you need, when you need it.  We recommend signing up for the patient portal called "MyChart".  Sign up information is provided on this After Visit Summary.  MyChart is used to connect with patients for Virtual Visits (Telemedicine).  Patients are able to view lab/test results, encounter notes, upcoming appointments, etc.  Non-urgent messages can be sent to your provider as well.   To learn more about what you can do with MyChart, go to [email or web address].    Your next appointment:   12 month(s)  The format for your next appointment:   In Person  Provider:   Maisie Fus, MD      Signed, Maisie Fus, MD  10/11/2022 3:51 PM    [facility]

## 2022-10-11 NOTE — Patient Instructions (Addendum)
Medication Instructions:   No changes   *If you need a refill on your cardiac medications before your next appointment, please call your pharmacy*   Lab Work:  Not neededd     Testing/Procedures:  Not needed   Follow-Up: At [facility], you and your health needs are our priority.  As part of our continuing mission to provide you with exceptional heart care, we have created designated Provider Care Teams.  These Care Teams include your primary Cardiologist (physician) and Advanced Practice Providers (APPs -  Physician Assistants and Nurse Practitioners) who all work together to provide you with the care you need, when you need it.  We recommend signing up for the patient portal called "MyChart".  Sign up information is provided on this After Visit Summary.  MyChart is used to connect with patients for Virtual Visits (Telemedicine).  Patients are able to view lab/test results, encounter notes, upcoming appointments, etc.  Non-urgent messages can be sent to your provider as well.   To learn more about what you can do with MyChart, go to [email or web address].    Your next appointment:   12 month(s)  The format for your next appointment:   In Person  Provider:   Maisie Fus, MD

## 2022-10-20 ENCOUNTER — Other Ambulatory Visit: Payer: Self-pay | Admitting: Internal Medicine

## 2022-10-20 DIAGNOSIS — G8929 Other chronic pain: Secondary | ICD-10-CM

## 2022-10-20 DIAGNOSIS — M545 Low back pain, unspecified: Secondary | ICD-10-CM

## 2022-10-20 DIAGNOSIS — M5136 Other intervertebral disc degeneration, lumbar region: Secondary | ICD-10-CM

## 2022-10-22 ENCOUNTER — Ambulatory Visit
Admission: RE | Admit: 2022-10-22 | Discharge: 2022-10-22 | Disposition: A | Discharge status: Home / Self Care | Payer: Medicare HMO | Source: Ambulatory Visit | Attending: Internal Medicine | Admitting: Internal Medicine

## 2022-10-22 DIAGNOSIS — M47816 Spondylosis without myelopathy or radiculopathy, lumbar region: Secondary | ICD-10-CM | POA: Diagnosis not present

## 2022-10-22 DIAGNOSIS — M4316 Spondylolisthesis, lumbar region: Secondary | ICD-10-CM | POA: Diagnosis not present

## 2022-10-22 DIAGNOSIS — G8929 Other chronic pain: Secondary | ICD-10-CM

## 2022-10-22 DIAGNOSIS — M47817 Spondylosis without myelopathy or radiculopathy, lumbosacral region: Secondary | ICD-10-CM | POA: Diagnosis not present

## 2022-10-22 DIAGNOSIS — M5136 Other intervertebral disc degeneration, lumbar region: Secondary | ICD-10-CM

## 2022-10-22 DIAGNOSIS — M5126 Other intervertebral disc displacement, lumbar region: Secondary | ICD-10-CM | POA: Diagnosis not present

## 2022-10-22 DIAGNOSIS — M545 Low back pain, unspecified: Secondary | ICD-10-CM

## 2022-10-31 NOTE — Progress Notes (Signed)
[facility] Summary Report / Loop Recorder 

## 2022-11-06 DIAGNOSIS — S22080A Wedge compression fracture of T11-T12 vertebra, initial encounter for closed fracture: Secondary | ICD-10-CM | POA: Diagnosis not present

## 2022-11-07 ENCOUNTER — Other Ambulatory Visit: Payer: Self-pay | Admitting: Neurosurgery

## 2022-11-08 NOTE — Pre-Procedure Instructions (Addendum)
Surgical Instructions  Your procedure is scheduled on November 10, 2022. Report to [facility] Main Entrance "A" at 1:50 PM, then check in with the Admitting office. Any questions or running late day of surgery: call [phone]  Questions prior to your surgery date: call [phone], Monday-Friday, 8am-4pm. If you experience any cold or flu symptoms such as cough, fever, chills, shortness of breath, etc. between now and your scheduled surgery, please notify us at the above number.     Remember:  Do not eat after midnight the night before your surgery  You may drink clear liquids until 12:50pm the afternoon of your surgery.   Clear liquids allowed are: Water, Non-Citrus Juices (without pulp), Carbonated Beverages, Clear Tea, Black Coffee Only (NO MILK, CREAM OR POWDERED CREAMER of any kind), and Gatorade.    Take these medicines the morning of surgery with A SIP OF WATER : Amlodipine (Norvasc) Atorvastatin (Lipitor) Breztri Aerosphere inhaler Metoprolol Succinate (Toprol-XL)   May take these medicines IF NEEDED: Omeprazole (Prilosec) Prochlorperazine (Compazine)  Follow your Doctor's Instructions when to STOP your clopidogrel (PLAVIX). If no instructions were received, please call your prescribing doctor.  Starting Today, STOP taking any Aspirin (unless otherwise instructed by your surgeon) Aleve, Naproxen, Ibuprofen, Motrin, Advil, Goody's, BC's, all herbal medications, fish oil, and non-prescription vitamins.  WHAT DO I DO ABOUT MY DIABETES MEDICATION?   Do not take oral diabetes medicines (pills) the morning of surgery. DO NOT take Metformin (Glucophage) the morning of surgery.   HOW TO MANAGE YOUR DIABETES BEFORE AND AFTER SURGERY  Why is it important to control my blood sugar before and after surgery? Improving blood sugar levels before and after surgery helps healing and can limit problems. A way of improving blood sugar control is eating a healthy diet by:  Eating less  sugar and carbohydrates  Increasing activity/exercise  Talking with your doctor about reaching your blood sugar goals High blood sugars (greater than 180 mg/dL) can raise your risk of infections and slow your recovery, so you will need to focus on controlling your diabetes during the weeks before surgery. Make sure that the doctor who takes care of your diabetes knows about your planned surgery including the date and location.  How do I manage my blood sugar before surgery? Check your blood sugar at least 4 times a day, starting 2 days before surgery, to make sure that the level is not too high or low.  Check your blood sugar the morning of your surgery when you wake up and every 2 hours until you get to the Short Stay unit.  If your blood sugar is less than 70 mg/dL, you will need to treat for low blood sugar: Do not take insulin. Treat a low blood sugar (less than 70 mg/dL) with  cup of clear juice (cranberry or apple), 4 glucose tablets, OR glucose gel. Recheck blood sugar in 15 minutes after treatment (to make sure it is greater than 70 mg/dL). If your blood sugar is not greater than 70 mg/dL on recheck, call [phone] for further instructions. Report your blood sugar to the short stay nurse when you get to Short Stay.  If you are admitted to the hospital after surgery: Your blood sugar will be checked by the staff and you will probably be given insulin after surgery (instead of oral diabetes medicines) to make sure you have good blood sugar levels. The goal for blood sugar control after surgery is 80-180 mg/dL.  Do NOT Smoke (Tobacco/Vaping) for 24 hours prior to your procedure.  If you use a CPAP at night, you may bring your mask/headgear for your overnight stay.   You will be asked to remove any contacts, glasses, piercing's, hearing aid's, dentures/partials prior to surgery. Please bring cases for these items if needed.    Patients discharged the day of  surgery will not be allowed to drive home, and someone needs to stay with them for 24 hours.  SURGICAL WAITING ROOM VISITATION Patients may have no more than 2 support people in the waiting area - these visitors may rotate.   Pre-op nurse will coordinate an appropriate time for 1 ADULT support person, who may not rotate, to accompany patient in pre-op.  Children under the age of 110 must have an adult with them who is not the patient and must remain in the main waiting area with an adult.  If the patient needs to stay at the hospital during part of their recovery, the visitor guidelines for inpatient rooms apply.  Please refer to the [facility] website for the visitor guidelines for any additional information.   If you received a COVID test during your pre-op visit  it is requested that you wear a mask when out in public, stay away from anyone that may not be feeling well and notify your surgeon if you develop symptoms. If you have been in contact with anyone that has tested positive in the last 10 days please notify you surgeon.      Pre-operative 5 CHG Bath Instructions   You can play a key role in reducing the risk of infection after surgery. Your skin needs to be as free of germs as possible. You can reduce the number of germs on your skin by washing with CHG (chlorhexidine gluconate) soap before surgery. CHG is an antiseptic soap that kills germs and continues to kill germs even after washing.   DO NOT use if you have an allergy to chlorhexidine/CHG or antibacterial soaps. If your skin becomes reddened or irritated, stop using the CHG and notify one of our RNs at [phone].   Please shower with the CHG soap starting 4 days before surgery using the following schedule:     Please keep in mind the following:  DO NOT shave, including legs and underarms, starting the day of your first shower.   You may shave your face at any point before/day of surgery.  Place clean sheets on your  bed the day you start using CHG soap. Use a clean washcloth (not used since being washed) for each shower. DO NOT sleep with pets once you start using the CHG.   CHG Shower Instructions:  If you choose to wash your hair and private area, wash first with your normal shampoo/soap.  After you use shampoo/soap, rinse your hair and body thoroughly to remove shampoo/soap residue.  Turn the water OFF and apply about 3 tablespoons (45 ml) of CHG soap to a CLEAN washcloth.  Apply CHG soap ONLY FROM YOUR NECK DOWN TO YOUR TOES (washing for 3-5 minutes)  DO NOT use CHG soap on face, private areas, open wounds, or sores.  Pay special attention to the area where your surgery is being performed.  If you are having back surgery, having someone wash your back for you may be helpful. Wait 2 minutes after CHG soap is applied, then you may rinse off the CHG soap.  Pat dry with a clean towel  Put on clean  clothes/pajamas   If you choose to wear lotion, please use ONLY the CHG-compatible lotions on the back of this paper.   Additional instructions for the day of surgery: DO NOT APPLY any lotions, deodorants, cologne, or perfumes.   Do not bring valuables to the hospital. [facility] is not responsible for any belongings/valuables. Do not wear nail polish, gel polish, artificial nails, or any other type of covering on natural nails (fingers and toes) Do not wear jewelry or makeup Put on clean/comfortable clothes.  Please brush your teeth.  Ask your nurse before applying any prescription medications to the skin.     CHG Compatible Lotions   Aveeno Moisturizing lotion  Cetaphil Moisturizing Cream  Cetaphil Moisturizing Lotion  Clairol Herbal Essence Moisturizing Lotion, Dry Skin  Clairol Herbal Essence Moisturizing Lotion, Extra Dry Skin  Clairol Herbal Essence Moisturizing Lotion, Normal Skin  Curel Age Defying Therapeutic Moisturizing Lotion with Alpha Hydroxy  Curel Extreme Care Body Lotion  Curel  Soothing Hands Moisturizing Hand Lotion  Curel Therapeutic Moisturizing Cream, Fragrance-Free  Curel Therapeutic Moisturizing Lotion, Fragrance-Free  Curel Therapeutic Moisturizing Lotion, Original Formula  Eucerin Daily Replenishing Lotion  Eucerin Dry Skin Therapy Plus Alpha Hydroxy Crme  Eucerin Dry Skin Therapy Plus Alpha Hydroxy Lotion  Eucerin Original Crme  Eucerin Original Lotion  Eucerin Plus Crme Eucerin Plus Lotion  Eucerin TriLipid Replenishing Lotion  Keri Anti-Bacterial Hand Lotion  Keri Deep Conditioning Original Lotion Dry Skin Formula Softly Scented  Keri Deep Conditioning Original Lotion, Fragrance Free Sensitive Skin Formula  Keri Lotion Fast Absorbing Fragrance Free Sensitive Skin Formula  Keri Lotion Fast Absorbing Softly Scented Dry Skin Formula  Keri Original Lotion  Keri Skin Renewal Lotion Keri Silky Smooth Lotion  Keri Silky Smooth Sensitive Skin Lotion  Nivea Body Creamy Conditioning Oil  Nivea Body Extra Enriched Lotion  Nivea Body Original Lotion  Nivea Body Sheer Moisturizing Lotion Nivea Crme  Nivea Skin Firming Lotion  NutraDerm 30 Skin Lotion  NutraDerm Skin Lotion  NutraDerm Therapeutic Skin Cream  NutraDerm Therapeutic Skin Lotion  ProShield Protective Hand Cream  Provon moisturizing lotion  Please read over the following fact sheets that you were given.

## 2022-11-09 ENCOUNTER — Inpatient Hospital Stay (HOSPITAL_COMMUNITY)
Admission: RE | Admit: 2022-11-09 | Discharge: 2022-11-09 | Disposition: A | Discharge status: Home / Self Care | Payer: Medicare HMO | Source: Ambulatory Visit

## 2022-11-09 DIAGNOSIS — Z01818 Encounter for other preprocedural examination: Secondary | ICD-10-CM

## 2022-11-10 ENCOUNTER — Encounter (HOSPITAL_COMMUNITY): Admission: RE | Payer: Self-pay | Source: Home / Self Care

## 2022-11-10 ENCOUNTER — Ambulatory Visit (HOSPITAL_COMMUNITY): Admission: RE | Admit: 2022-11-10 | Payer: Medicare HMO | Source: Home / Self Care | Admitting: Neurosurgery

## 2022-11-10 SURGERY — KYPHOPLASTY
Anesthesia: General

## 2022-11-13 ENCOUNTER — Ambulatory Visit: Payer: Medicare HMO

## 2022-11-13 DIAGNOSIS — I6300 Cerebral infarction due to thrombosis of unspecified precerebral artery: Secondary | ICD-10-CM

## 2022-11-14 LAB — CUP PACEART REMOTE DEVICE CHECK
Date Time Interrogation Session: 20240714231255
Implantable Pulse Generator Implant Date: 20231123

## 2022-11-20 ENCOUNTER — Emergency Department (HOSPITAL_COMMUNITY)
Admission: EM | Admit: 2022-11-20 | Discharge: 2022-11-21 | Disposition: A | Discharge status: Home / Self Care | Payer: Medicare HMO | Attending: Emergency Medicine | Admitting: Emergency Medicine

## 2022-11-20 ENCOUNTER — Encounter (HOSPITAL_COMMUNITY): Payer: Self-pay | Admitting: Emergency Medicine

## 2022-11-20 DIAGNOSIS — I10 Essential (primary) hypertension: Secondary | ICD-10-CM | POA: Insufficient documentation

## 2022-11-20 DIAGNOSIS — E872 Acidosis, unspecified: Secondary | ICD-10-CM | POA: Diagnosis not present

## 2022-11-20 DIAGNOSIS — Z7984 Long term (current) use of oral hypoglycemic drugs: Secondary | ICD-10-CM | POA: Diagnosis not present

## 2022-11-20 DIAGNOSIS — Z79899 Other long term (current) drug therapy: Secondary | ICD-10-CM | POA: Insufficient documentation

## 2022-11-20 DIAGNOSIS — M25562 Pain in left knee: Secondary | ICD-10-CM | POA: Diagnosis not present

## 2022-11-20 DIAGNOSIS — Z85118 Personal history of other malignant neoplasm of bronchus and lung: Secondary | ICD-10-CM | POA: Insufficient documentation

## 2022-11-20 DIAGNOSIS — K5900 Constipation, unspecified: Secondary | ICD-10-CM | POA: Insufficient documentation

## 2022-11-20 DIAGNOSIS — Z7902 Long term (current) use of antithrombotics/antiplatelets: Secondary | ICD-10-CM | POA: Insufficient documentation

## 2022-11-20 DIAGNOSIS — E1110 Type 2 diabetes mellitus with ketoacidosis without coma: Secondary | ICD-10-CM | POA: Insufficient documentation

## 2022-11-20 DIAGNOSIS — R000 Tachycardia, unspecified: Secondary | ICD-10-CM | POA: Diagnosis not present

## 2022-11-20 LAB — CBC WITH DIFFERENTIAL/PLATELET
Abs Immature Granulocytes: 0.03 10*3/uL (ref 0.00–0.07)
Basophils Absolute: 0 10*3/uL (ref 0.0–0.1)
Basophils Relative: 0 %
Eosinophils Absolute: 0.1 10*3/uL (ref 0.0–0.5)
Eosinophils Relative: 1 %
HCT: 41.4 % (ref 39.0–52.0)
Hemoglobin: 13.6 g/dL (ref 13.0–17.0)
Immature Granulocytes: 0 %
Lymphocytes Relative: 4 %
Lymphs Abs: 0.4 10*3/uL — ABNORMAL LOW (ref 0.7–4.0)
MCH: 33.2 pg (ref 26.0–34.0)
MCHC: 32.9 g/dL (ref 30.0–36.0)
MCV: 101 fL — ABNORMAL HIGH (ref 80.0–100.0)
Monocytes Absolute: 0.5 10*3/uL (ref 0.1–1.0)
Monocytes Relative: 5 %
Neutro Abs: 8.7 10*3/uL — ABNORMAL HIGH (ref 1.7–7.7)
Neutrophils Relative %: 90 %
Platelets: 290 10*3/uL (ref 150–400)
RBC: 4.1 MIL/uL — ABNORMAL LOW (ref 4.22–5.81)
RDW: 20.3 % — ABNORMAL HIGH (ref 11.5–15.5)
WBC: 9.8 10*3/uL (ref 4.0–10.5)
nRBC: 0 % (ref 0.0–0.2)

## 2022-11-20 LAB — COMPREHENSIVE METABOLIC PANEL
ALT: 12 U/L (ref 0–44)
AST: 14 U/L — ABNORMAL LOW (ref 15–41)
Albumin: 3.7 g/dL (ref 3.5–5.0)
Alkaline Phosphatase: 123 U/L (ref 38–126)
Anion gap: 10 (ref 5–15)
BUN: 26 mg/dL — ABNORMAL HIGH (ref 8–23)
CO2: 15 mmol/L — ABNORMAL LOW (ref 22–32)
Calcium: 10.7 mg/dL — ABNORMAL HIGH (ref 8.9–10.3)
Chloride: 111 mmol/L (ref 98–111)
Creatinine, Ser: 0.92 mg/dL (ref 0.61–1.24)
GFR, Estimated: >60 mL/min (ref >60)
Glucose, Bld: 96 mg/dL (ref 70–99)
Potassium: 4.3 mmol/L (ref 3.5–5.1)
Sodium: 136 mmol/L (ref 135–145)
Total Bilirubin: 1 mg/dL (ref 0.3–1.2)
Total Protein: 8.3 g/dL — ABNORMAL HIGH (ref 6.5–8.1)

## 2022-11-20 MED ORDER — LACTATED RINGERS IV BOLUS
1000.0000 mL | Freq: Once | INTRAVENOUS | Status: AC
  Administered 2022-11-20: 1000 mL via INTRAVENOUS

## 2022-11-20 MED ORDER — TRAMADOL HCL 50 MG PO TABS
50.0000 mg | ORAL_TABLET | Freq: Once | ORAL | Status: AC
  Administered 2022-11-21: 50 mg via ORAL
  Filled 2022-11-20: qty 1

## 2022-11-20 MED ORDER — TRAMADOL HCL 50 MG PO TABS: 50.0000 mg | ORAL_TABLET | Freq: Once | ORAL | Status: DC

## 2022-11-20 NOTE — ED Provider Notes (Signed)
[place] EMERGENCY DEPARTMENT AT [facility] Provider Note   CSN: [id number] Arrival date & time: 11/20/22  1906     History {Add pertinent medical, surgical, social history, OB history to HPI:1} Chief Complaint  Patient presents with   Constipation    Gavin Dennis is a 72 y.o. male.  The history is provided by the patient.  Constipation He has history of hypertension, diabetes, hyperlipidemia, stroke, ascending aortic aneurysm, spinal compression fracture, lung cancer treated with chemotherapy and radiation and comes in complaining of ongoing pain in his mid back from his compression fracture, pain in his left knee which is related to known osteoarthritis, and ongoing problems with constipation.  He cannot have a bowel movement except when he takes milk of magnesia and an unknown laxative.  He has noted that his stools have been black.  He does admit to using Pepto-Bismol but thinks his stools been black in addition to times when he used Pepto-Bismol.  He denies any abdominal pain, nausea, vomiting.  Of note, he had been scheduled for kyphoplasty but that was canceled and he does not know why it was canceled.  He had been taking tramadol which was giving him relief but he has run out.   Home Medications Prior to Admission medications   Medication Sig Start Date End Date Taking? Authorizing Provider  amLODipine (NORVASC) 10 MG tablet Take 1 tablet (10 mg total) by mouth daily. 11/15/20 05/16/26  Grayce Sessions, NP  atorvastatin (LIPITOR) 20 MG tablet Take 20 mg by mouth in the morning. 04/06/22   [provider]  BREZTRI AEROSPHERE 160-9-4.8 MCG/ACT AERO Inhale 2 puffs into the lungs daily. 02/27/22   [provider]  clopidogrel (PLAVIX) 75 MG tablet Take 1 tablet (75 mg total) by mouth daily. 03/16/22   Osvaldo Shipper, MD  Cyanocobalamin (VITAMIN B-12 PO) Take 1 tablet by mouth daily.    [provider]  lidocaine (XYLOCAINE) 2 % solution Use  as directed 15 mLs in the mouth or throat as needed for mouth pain. Swallow 20 mi prior to meals 08/22/22   Antony Blackbird, MD  metFORMIN (GLUCOPHAGE) 500 MG tablet Take 500 mg by mouth in the morning.    [provider]  metoprolol succinate (TOPROL-XL) 25 MG 24 hr tablet Take 25 mg by mouth daily. 06/20/22   [provider]  Omega-3 Fatty Acids (OMEGA-3 PO) Take 1 capsule by mouth in the morning. OmegaXL Joint Pain Relief & Inflammation Supplement    [provider]  omeprazole (PRILOSEC) 40 MG capsule Take 40 mg by mouth daily as needed (indigestion.). 05/25/22   [provider]  potassium chloride (KLOR-CON) 10 MEQ tablet Take 10 mEq by mouth daily. 06/13/22   [provider]  prochlorperazine (COMPAZINE) 10 MG tablet Take 1 tablet (10 mg total) by mouth every 6 (six) hours as needed for nausea or vomiting. 06/26/22   Si Gaul, MD  Study - OCEANIC-STROKE - asundexian 50 mg or placebo tablet (PI-Sethi) Take 1 tablet (50 mg total) by mouth daily. For Investigational Use Only. Take at the same time each day (preferably in the morning). Tablet should be swallowed whole with water; it CANNOT be crushed or broken. Patient not taking: Reported on 07/07/2022 03/16/22   Osvaldo Shipper, MD  sucralfate (CARAFATE) 1 g tablet Take 1 tablet (1 g total) by mouth 4 (four) times daily -  with meals and at bedtime. Crush and dissolve in 10 mL's of warm water, swallow 30 min  prior to meals and bedtime 08/08/22   Antony Blackbird, MD      Allergies    Patient has no known allergies.    Review of Systems   Review of Systems  Gastrointestinal:  Positive for constipation.  All other systems reviewed and are negative.   Physical Exam Updated Vital Signs BP 106/84 (BP Location: Right Arm)   Pulse (!) 125   Temp 97.9 F (36.6 C) (Oral)   Resp 18   SpO2 98%  Physical Exam Vitals and nursing note reviewed.   72 year old male, resting comfortably and in no acute  distress. Vital signs are significant for elevated heart rate. Oxygen saturation is 98%, which is normal. Head is normocephalic and atraumatic. PERRLA, EOMI. Oropharynx is clear. Neck is nontender and supple without adenopathy or JVD. Back is nontender to direct palpation but he has pain in his upper lumbar area when he moves. Lungs are clear without rales, wheezes, or rhonchi. Chest is nontender. Heart has regular rate and rhythm without murmur. Abdomen is soft, flat, nontender. Rectal: Decreased sphincter tone, small amount of black stool, but no impaction. Extremities have no cyanosis or edema, full range of motion is present. Skin is warm and dry without rash. Neurologic: Mental status is normal, cranial nerves are intact, moves all extremities equally.  ED Results / Procedures / Treatments   Labs (all labs ordered are listed, but only abnormal results are displayed) Labs Reviewed  COMPREHENSIVE METABOLIC PANEL - Abnormal; Notable for the following components:      Result Value   CO2 15 (*)    BUN 26 (*)    Calcium 10.7 (*)    Total Protein 8.3 (*)    AST 14 (*)    All other components within normal limits  CBC WITH DIFFERENTIAL/PLATELET - Abnormal; Notable for the following components:   RBC 4.10 (*)    MCV 101.0 (*)    RDW 20.3 (*)    Neutro Abs 8.7 (*)    Lymphs Abs 0.4 (*)    All other components within normal limits    EKG None  Radiology No results found.  Procedures Procedures  {Document cardiac monitor, telemetry assessment procedure when appropriate:1}  Medications Ordered in ED Medications - No data to display  ED Course/ Medical Decision Making/ A&P   {   Click here for ABCD2, HEART and other calculatorsREFRESH Note before signing :1}                          Medical Decision Making Amount and/or Complexity of Data Reviewed Labs: ordered.   Back pain secondary to known compression fracture, no evidence of more serious pathology.  Chronic  constipation likely worsened by laxative abuse.  Chronic left knee pain secondary to known degenerative joint disease.  I have reviewed and interpreted his laboratory tests, and my interpretation is borderline hypercalcemia which will need to be observed with known history of lung cancer, normal anion gap metabolic acidosis which is new compared with 08/21/2022, macrocytosis without anemia.  I have ordered stool Hemoccult but have low index of suspicion for bleeding.  Hemoglobin today is actually increased compared with baseline.  However, I am unsure why he is tachycardic.  I have ordered IV fluids and I have ordered a dose of tramadol for pain.  I reviewed his past records and it appears that he has completed his treatment for lung cancer and is pending follow-up CT scan. {Document critical  care time when appropriate:1} {Document review of labs and clinical decision tools ie heart score, Chads2Vasc2 etc:1}  {Document your independent review of radiology images, and any outside records:1} {Document your discussion with family members, caretakers, and with consultants:1} {Document social determinants of health affecting pt's care:1} {Document your decision making why or why not admission, treatments were needed:1} Final Clinical Impression(s) / ED Diagnoses Final diagnoses:  None    Rx / DC Orders ED Discharge Orders     None

## 2022-11-20 NOTE — ED Triage Notes (Signed)
Pt reports he has been constipated for months., LBM today. States it is black. Been taking pepto bismal. Is a cancer pt- lung and finished tx in May. Unsure if he is still needing to see onc. Also reports he has chronic back pain been taking tylenol for 6 a day. Denies taking NSAIDS.

## 2022-11-21 ENCOUNTER — Other Ambulatory Visit (HOSPITAL_COMMUNITY): Payer: Self-pay | Admitting: Neurosurgery

## 2022-11-21 DIAGNOSIS — S22080A Wedge compression fracture of T11-T12 vertebra, initial encounter for closed fracture: Secondary | ICD-10-CM

## 2022-11-21 DIAGNOSIS — K5900 Constipation, unspecified: Secondary | ICD-10-CM | POA: Diagnosis not present

## 2022-11-21 LAB — POC OCCULT BLOOD, ED: Fecal Occult Bld: NEGATIVE

## 2022-11-21 MED ORDER — TRAMADOL HCL 50 MG PO TABS: 50.0000 mg | ORAL_TABLET | Freq: Two times a day (BID) | ORAL | 0 refills | Status: DC | PRN

## 2022-11-21 MED ORDER — MORPHINE SULFATE (PF) 4 MG/ML IV SOLN
4.0000 mg | Freq: Once | INTRAVENOUS | Status: AC
  Administered 2022-11-21: 4 mg via INTRAVENOUS
  Filled 2022-11-21: qty 1

## 2022-11-21 NOTE — Discharge Instructions (Signed)
Drink plenty of fluids.  Take polyethylene glycol (MiraLAX) for your constipation.  You can adjust the dose up or down until you get the desired effect.  Try to avoid using milk of magnesia or other stimulant laxatives except on a very occasional basis.  Return to the emergency department if you are having any problems.

## 2022-11-27 NOTE — Progress Notes (Signed)
[facility] Summary Report / Loop Recorder 

## 2022-11-29 ENCOUNTER — Ambulatory Visit (HOSPITAL_COMMUNITY): Payer: Medicare HMO

## 2022-11-29 ENCOUNTER — Encounter (HOSPITAL_COMMUNITY): Payer: Self-pay

## 2022-11-30 DIAGNOSIS — I272 Pulmonary hypertension, unspecified: Secondary | ICD-10-CM | POA: Diagnosis not present

## 2022-11-30 DIAGNOSIS — K5909 Other constipation: Secondary | ICD-10-CM | POA: Diagnosis not present

## 2022-11-30 DIAGNOSIS — I10 Essential (primary) hypertension: Secondary | ICD-10-CM | POA: Diagnosis not present

## 2022-11-30 DIAGNOSIS — S22080D Wedge compression fracture of T11-T12 vertebra, subsequent encounter for fracture with routine healing: Secondary | ICD-10-CM | POA: Diagnosis not present

## 2022-11-30 DIAGNOSIS — E119 Type 2 diabetes mellitus without complications: Secondary | ICD-10-CM | POA: Diagnosis not present

## 2022-11-30 DIAGNOSIS — M4602 Spinal enthesopathy, cervical region: Secondary | ICD-10-CM | POA: Diagnosis not present

## 2022-11-30 DIAGNOSIS — E7849 Other hyperlipidemia: Secondary | ICD-10-CM | POA: Diagnosis not present

## 2022-11-30 DIAGNOSIS — Z8673 Personal history of transient ischemic attack (TIA), and cerebral infarction without residual deficits: Secondary | ICD-10-CM | POA: Diagnosis not present

## 2022-11-30 DIAGNOSIS — M1712 Unilateral primary osteoarthritis, left knee: Secondary | ICD-10-CM | POA: Diagnosis not present

## 2022-12-08 ENCOUNTER — Emergency Department (HOSPITAL_COMMUNITY)
Admission: EM | Admit: 2022-12-08 | Discharge: 2022-12-09 | Disposition: A | Discharge status: Home / Self Care | Payer: Medicare HMO | Attending: Emergency Medicine | Admitting: Emergency Medicine

## 2022-12-08 ENCOUNTER — Encounter (HOSPITAL_COMMUNITY): Payer: Self-pay

## 2022-12-08 ENCOUNTER — Other Ambulatory Visit: Payer: Self-pay

## 2022-12-08 DIAGNOSIS — E119 Type 2 diabetes mellitus without complications: Secondary | ICD-10-CM | POA: Diagnosis not present

## 2022-12-08 DIAGNOSIS — K828 Other specified diseases of gallbladder: Secondary | ICD-10-CM | POA: Diagnosis not present

## 2022-12-08 DIAGNOSIS — S22070A Wedge compression fracture of T9-T10 vertebra, initial encounter for closed fracture: Secondary | ICD-10-CM | POA: Diagnosis not present

## 2022-12-08 DIAGNOSIS — S29002A Unspecified injury of muscle and tendon of back wall of thorax, initial encounter: Secondary | ICD-10-CM | POA: Diagnosis present

## 2022-12-08 DIAGNOSIS — R109 Unspecified abdominal pain: Secondary | ICD-10-CM | POA: Insufficient documentation

## 2022-12-08 DIAGNOSIS — C7951 Secondary malignant neoplasm of bone: Secondary | ICD-10-CM | POA: Diagnosis not present

## 2022-12-08 DIAGNOSIS — Z79899 Other long term (current) drug therapy: Secondary | ICD-10-CM | POA: Insufficient documentation

## 2022-12-08 DIAGNOSIS — M47814 Spondylosis without myelopathy or radiculopathy, thoracic region: Secondary | ICD-10-CM | POA: Diagnosis not present

## 2022-12-08 DIAGNOSIS — Z7984 Long term (current) use of oral hypoglycemic drugs: Secondary | ICD-10-CM | POA: Insufficient documentation

## 2022-12-08 DIAGNOSIS — S22000A Wedge compression fracture of unspecified thoracic vertebra, initial encounter for closed fracture: Secondary | ICD-10-CM

## 2022-12-08 DIAGNOSIS — J432 Centrilobular emphysema: Secondary | ICD-10-CM | POA: Diagnosis not present

## 2022-12-08 DIAGNOSIS — R0682 Tachypnea, not elsewhere classified: Secondary | ICD-10-CM | POA: Diagnosis not present

## 2022-12-08 DIAGNOSIS — Z859 Personal history of malignant neoplasm, unspecified: Secondary | ICD-10-CM | POA: Diagnosis not present

## 2022-12-08 DIAGNOSIS — C7800 Secondary malignant neoplasm of unspecified lung: Secondary | ICD-10-CM | POA: Diagnosis not present

## 2022-12-08 DIAGNOSIS — C3490 Malignant neoplasm of unspecified part of unspecified bronchus or lung: Secondary | ICD-10-CM | POA: Diagnosis not present

## 2022-12-08 DIAGNOSIS — X501XXA Overexertion from prolonged static or awkward postures, initial encounter: Secondary | ICD-10-CM | POA: Insufficient documentation

## 2022-12-08 DIAGNOSIS — R000 Tachycardia, unspecified: Secondary | ICD-10-CM | POA: Diagnosis not present

## 2022-12-08 DIAGNOSIS — S22079A Unspecified fracture of T9-T10 vertebra, initial encounter for closed fracture: Secondary | ICD-10-CM | POA: Diagnosis not present

## 2022-12-08 DIAGNOSIS — I10 Essential (primary) hypertension: Secondary | ICD-10-CM | POA: Diagnosis not present

## 2022-12-08 DIAGNOSIS — M4854XA Collapsed vertebra, not elsewhere classified, thoracic region, initial encounter for fracture: Secondary | ICD-10-CM | POA: Diagnosis not present

## 2022-12-08 DIAGNOSIS — J189 Pneumonia, unspecified organism: Secondary | ICD-10-CM | POA: Diagnosis not present

## 2022-12-08 DIAGNOSIS — M8448XA Pathological fracture, other site, initial encounter for fracture: Secondary | ICD-10-CM | POA: Diagnosis not present

## 2022-12-08 DIAGNOSIS — C787 Secondary malignant neoplasm of liver and intrahepatic bile duct: Secondary | ICD-10-CM | POA: Diagnosis not present

## 2022-12-08 DIAGNOSIS — M47816 Spondylosis without myelopathy or radiculopathy, lumbar region: Secondary | ICD-10-CM | POA: Diagnosis not present

## 2022-12-08 DIAGNOSIS — S22089A Unspecified fracture of T11-T12 vertebra, initial encounter for closed fracture: Secondary | ICD-10-CM | POA: Diagnosis not present

## 2022-12-08 DIAGNOSIS — M545 Low back pain, unspecified: Secondary | ICD-10-CM | POA: Diagnosis not present

## 2022-12-08 NOTE — ED Triage Notes (Signed)
Arrives [facility] from home with c/o lower back pain. Says he was picking up a box on Ensure protein shakes and felt a pop.   Says he has a previous lower back fracture found on a MRI back in June.

## 2022-12-09 ENCOUNTER — Emergency Department (HOSPITAL_COMMUNITY): Payer: Medicare HMO

## 2022-12-09 DIAGNOSIS — M47814 Spondylosis without myelopathy or radiculopathy, thoracic region: Secondary | ICD-10-CM | POA: Diagnosis not present

## 2022-12-09 DIAGNOSIS — M4854XA Collapsed vertebra, not elsewhere classified, thoracic region, initial encounter for fracture: Secondary | ICD-10-CM | POA: Diagnosis not present

## 2022-12-09 DIAGNOSIS — K828 Other specified diseases of gallbladder: Secondary | ICD-10-CM | POA: Diagnosis not present

## 2022-12-09 DIAGNOSIS — C787 Secondary malignant neoplasm of liver and intrahepatic bile duct: Secondary | ICD-10-CM | POA: Diagnosis not present

## 2022-12-09 DIAGNOSIS — C7951 Secondary malignant neoplasm of bone: Secondary | ICD-10-CM | POA: Diagnosis not present

## 2022-12-09 DIAGNOSIS — J189 Pneumonia, unspecified organism: Secondary | ICD-10-CM | POA: Diagnosis not present

## 2022-12-09 DIAGNOSIS — C7800 Secondary malignant neoplasm of unspecified lung: Secondary | ICD-10-CM | POA: Diagnosis not present

## 2022-12-09 DIAGNOSIS — M8448XA Pathological fracture, other site, initial encounter for fracture: Secondary | ICD-10-CM | POA: Diagnosis not present

## 2022-12-09 DIAGNOSIS — J432 Centrilobular emphysema: Secondary | ICD-10-CM | POA: Diagnosis not present

## 2022-12-09 DIAGNOSIS — M47816 Spondylosis without myelopathy or radiculopathy, lumbar region: Secondary | ICD-10-CM | POA: Diagnosis not present

## 2022-12-09 DIAGNOSIS — M545 Low back pain, unspecified: Secondary | ICD-10-CM | POA: Diagnosis not present

## 2022-12-09 DIAGNOSIS — C3490 Malignant neoplasm of unspecified part of unspecified bronchus or lung: Secondary | ICD-10-CM | POA: Diagnosis not present

## 2022-12-09 LAB — CBC WITH DIFFERENTIAL/PLATELET
Abs Immature Granulocytes: 0.04 10*3/uL (ref 0.00–0.07)
Basophils Absolute: 0 10*3/uL (ref 0.0–0.1)
Basophils Relative: 0 %
Eosinophils Absolute: 0.3 10*3/uL (ref 0.0–0.5)
Eosinophils Relative: 3 %
HCT: 46.9 % (ref 39.0–52.0)
Hemoglobin: 15.4 g/dL (ref 13.0–17.0)
Immature Granulocytes: 0 %
Lymphocytes Relative: 9 %
Lymphs Abs: 0.8 10*3/uL (ref 0.7–4.0)
MCH: 33.7 pg (ref 26.0–34.0)
MCHC: 32.8 g/dL (ref 30.0–36.0)
MCV: 102.6 fL — ABNORMAL HIGH (ref 80.0–100.0)
Monocytes Absolute: 0.8 10*3/uL (ref 0.1–1.0)
Monocytes Relative: 9 %
Neutro Abs: 7 10*3/uL (ref 1.7–7.7)
Neutrophils Relative %: 79 %
Platelets: 239 10*3/uL (ref 150–400)
RBC: 4.57 MIL/uL (ref 4.22–5.81)
RDW: 16.5 % — ABNORMAL HIGH (ref 11.5–15.5)
WBC: 8.9 10*3/uL (ref 4.0–10.5)
nRBC: 0 % (ref 0.0–0.2)

## 2022-12-09 LAB — COMPREHENSIVE METABOLIC PANEL
ALT: 14 U/L (ref 0–44)
AST: 13 U/L — ABNORMAL LOW (ref 15–41)
Albumin: 3.4 g/dL — ABNORMAL LOW (ref 3.5–5.0)
Alkaline Phosphatase: 141 U/L — ABNORMAL HIGH (ref 38–126)
Anion gap: 11 (ref 5–15)
BUN: 22 mg/dL (ref 8–23)
CO2: 15 mmol/L — ABNORMAL LOW (ref 22–32)
Calcium: 11.2 mg/dL — ABNORMAL HIGH (ref 8.9–10.3)
Chloride: 113 mmol/L — ABNORMAL HIGH (ref 98–111)
Creatinine, Ser: 1.1 mg/dL (ref 0.61–1.24)
GFR, Estimated: >60 mL/min (ref >60)
Glucose, Bld: 106 mg/dL — ABNORMAL HIGH (ref 70–99)
Potassium: 3.8 mmol/L (ref 3.5–5.1)
Sodium: 139 mmol/L (ref 135–145)
Total Bilirubin: 0.7 mg/dL (ref 0.3–1.2)
Total Protein: 7.8 g/dL (ref 6.5–8.1)

## 2022-12-09 LAB — BLOOD GAS, VENOUS
Acid-base deficit: 9.4 mmol/L — ABNORMAL HIGH (ref 0.0–2.0)
Bicarbonate: 16.4 mmol/L — ABNORMAL LOW (ref 20.0–28.0)
O2 Saturation: 48.7 %
Patient temperature: 37
pCO2, Ven: 35 mmHg — ABNORMAL LOW (ref 44–60)
pH, Ven: 7.28 (ref 7.25–7.43)
pO2, Ven: 31 mmHg — CL (ref 32–45)

## 2022-12-09 LAB — TROPONIN I (HIGH SENSITIVITY)
Troponin I (High Sensitivity): 7 ng/L (ref <18)
Troponin I (High Sensitivity): 7 ng/L (ref <18)

## 2022-12-09 LAB — BRAIN NATRIURETIC PEPTIDE: B Natriuretic Peptide: 21.5 pg/mL (ref 0.0–100.0)

## 2022-12-09 LAB — I-STAT CG4 LACTIC ACID, ED: Lactic Acid, Venous: 1.1 mmol/L (ref 0.5–1.9)

## 2022-12-09 MED ORDER — OXYCODONE-ACETAMINOPHEN 5-325 MG PO TABS
1.0000 | ORAL_TABLET | Freq: Once | ORAL | Status: AC
  Administered 2022-12-09: 1 via ORAL
  Filled 2022-12-09: qty 1

## 2022-12-09 MED ORDER — HYDROMORPHONE HCL 2 MG PO TABS: 2.0000 mg | ORAL_TABLET | ORAL | 0 refills | Status: AC | PRN

## 2022-12-09 MED ORDER — SODIUM CHLORIDE 0.9 % IV BOLUS: 500.0000 mL | Freq: Once | INTRAVENOUS | Status: DC

## 2022-12-09 MED ORDER — HYDROMORPHONE HCL 2 MG PO TABS
2.0000 mg | ORAL_TABLET | Freq: Once | ORAL | Status: AC
  Administered 2022-12-09: 2 mg via ORAL
  Filled 2022-12-09: qty 1

## 2022-12-09 MED ORDER — ONDANSETRON HCL 4 MG PO TABS: 4.0000 mg | ORAL_TABLET | Freq: Every day | ORAL | 0 refills | Status: DC | PRN

## 2022-12-09 MED ORDER — OXYCODONE-ACETAMINOPHEN 5-325 MG PO TABS: 1.0000 | ORAL_TABLET | Freq: Once | ORAL | Status: DC

## 2022-12-09 MED ORDER — METOPROLOL TARTRATE 5 MG/5ML IV SOLN
5.0000 mg | Freq: Once | INTRAVENOUS | Status: AC
  Administered 2022-12-09: 5 mg via INTRAVENOUS
  Filled 2022-12-09: qty 5

## 2022-12-09 MED ORDER — ACETAMINOPHEN 325 MG PO TABS
650.0000 mg | ORAL_TABLET | Freq: Once | ORAL | Status: AC
  Administered 2022-12-09: 650 mg via ORAL
  Filled 2022-12-09: qty 2

## 2022-12-09 MED ORDER — HYDROMORPHONE HCL 1 MG/ML IJ SOLN
1.0000 mg | Freq: Once | INTRAMUSCULAR | Status: AC
  Administered 2022-12-09: 1 mg via INTRAVENOUS
  Filled 2022-12-09: qty 1

## 2022-12-09 MED ORDER — IOHEXOL 350 MG/ML SOLN
100.0000 mL | Freq: Once | INTRAVENOUS | Status: AC | PRN
  Administered 2022-12-09: 100 mL via INTRAVENOUS

## 2022-12-09 MED ORDER — SODIUM CHLORIDE 0.9 % IV BOLUS
1000.0000 mL | Freq: Once | INTRAVENOUS | Status: AC
  Administered 2022-12-09: 1000 mL via INTRAVENOUS

## 2022-12-09 MED ORDER — METOPROLOL SUCCINATE ER 25 MG PO TB24
25.0000 mg | ORAL_TABLET | Freq: Every day | ORAL | Status: DC
  Administered 2022-12-09: 25 mg via ORAL
  Filled 2022-12-09: qty 1

## 2022-12-09 MED ORDER — IBUPROFEN 200 MG PO TABS: 400.0000 mg | ORAL_TABLET | Freq: Once | ORAL | Status: DC

## 2022-12-09 NOTE — Consult Note (Addendum)
Hospitalist Consultation History and Physical    Rigley Falgout VWU:[id number] DOB: 30-Jan-1951 DOA: 12/08/2022  DOS: the patient was seen and examined on 12/08/2022  PCP: Ollen Bowl, MD   Patient coming from: Home  I have personally briefly reviewed patient's old medical records in [facility]  CC: back pain HPI:  72 year old African-American male history of non-small lung cancer status post chemo and radiation presents to the ER today with worsening back pain.  Patient states that he has had back pain for about 3 months.  He states that he felt a pop in his back about 3 months ago when he was lifting some boxes.  He was scheduled to have surgery with Dr. Franky Macho in July 2024 but it was canceled.  Patient saw his PCP and received a prescription for hydrocodone.  He states this medication is not helping.  He presented by EMS yesterday evening.  CT scan chest abdomen pelvis  Showed negative PE.  He had a T10 and a T12 compression fracture along with lytic lesions consistent with metastasis.  CT also showed multiple liver lesions.  There is also a left adrenal mass.  Patient was only given 1 dose of occasion of Percocet at 8 AM.  [facility] consulted at 3:11 PM.  Patient was tachycardic but has not had any of his home hypertension medications last night.  He was given 1 dose of Toprol XL 25 mg at 11:50 AM.  Patient was still in a lot of pain.  A TLSO brace was already applied to him but he was still complaining of lots of pain.  After 1 dose of IV Dilaudid and 5 mg of IV Lopressor, blood pressure, heart rate have all improved.  His pain score is now 0.  Patient would like a prescription for p.o. Dilaudid.  Patient is medically stable for discharge.  I have sent a copy of his CT report to his oncologist.  Patient states he has a follow-up appointment with Dr. Franky Macho with neurosurgery this coming Tuesday.   ED Course: CT abd shows T10, T12 pathologic fractures  and metastatic liver lesions and adrenal lesion.  Review of Systems:  Review of Systems  Constitutional: Negative.   HENT: Negative.    Eyes: Negative.   Respiratory: Negative.    Cardiovascular: Negative.   Gastrointestinal: Negative.   Genitourinary: Negative.   Musculoskeletal:  Positive for back pain.  Skin: Negative.   Neurological: Negative.   Endo/Heme/Allergies: Negative.   Psychiatric/Behavioral: Negative.    All other systems reviewed and are negative.   Past Medical History:  Diagnosis Date   Blurred vision, left eye    since stroke 06/2020   CVA (cerebral vascular accident) (HCC) 06/2020   DDD (degenerative disc disease), lumbar    Diabetes mellitus without complication (HCC)    History of radiation therapy    Right lung- 07/17/22-08/28/22- Dr. Antony Blackbird   Hyperlipidemia    Hypertension    Hypomagnesemia    Polio    childhood   Vitamin B 12 deficiency     Past Surgical History:  Procedure Laterality Date   BRONCHIAL NEEDLE ASPIRATION BIOPSY  06/12/2022   Procedure: BRONCHIAL NEEDLE ASPIRATION BIOPSIES;  Surgeon: Chilton Greathouse, MD;  Location: [facility];  Service: Cardiopulmonary;;   COLONOSCOPY     ENDOBRONCHIAL ULTRASOUND Bilateral 06/12/2022   Procedure: ENDOBRONCHIAL ULTRASOUND;  Surgeon: Chilton Greathouse, MD;  Location: [facility];  Service: Cardiopulmonary;  Laterality: Bilateral;   HEMOSTASIS CONTROL  06/12/2022   Procedure: HEMOSTASIS  CONTROL;  Surgeon: Chilton Greathouse, MD;  Location: [facility];  Service: Cardiopulmonary;;   LOOP RECORDER INSERTION N/A 03/15/2022   Procedure: LOOP RECORDER INSERTION;  Surgeon: Maurice Small, MD;  Location: [facility];  Service: Cardiovascular;  Laterality: N/A;   VIDEO BRONCHOSCOPY N/A 06/12/2022   Procedure: VIDEO BRONCHOSCOPY WITHOUT FLUORO;  Surgeon: Chilton Greathouse, MD;  Location: [facility];  Service: Cardiopulmonary;  Laterality: N/A;     reports that he quit smoking about 3 years ago.  His smoking use included cigarettes. He started smoking about 33 years ago. He has a 30 pack-year smoking history. He has been exposed to tobacco smoke. He has never used smokeless tobacco. He reports that he does not currently use alcohol. He reports that he does not currently use drugs after having used the following drugs: Cocaine.  No Known Allergies  Family History  Problem Relation Age of Onset   Heart Problems Mother    Prostate cancer Father    Lung cancer Brother     Prior to Admission medications   Medication Sig Start Date End Date Taking? Authorizing Provider  amLODipine (NORVASC) 10 MG tablet Take 1 tablet (10 mg total) by mouth daily. 11/15/20 05/16/26  Grayce Sessions, NP  atorvastatin (LIPITOR) 20 MG tablet Take 20 mg by mouth in the morning. 04/06/22   [provider]  BREZTRI AEROSPHERE 160-9-4.8 MCG/ACT AERO Inhale 2 puffs into the lungs daily. 02/27/22   [provider]  clopidogrel (PLAVIX) 75 MG tablet Take 1 tablet (75 mg total) by mouth daily. 03/16/22   Osvaldo Shipper, MD  Cyanocobalamin (VITAMIN B-12 PO) Take 1 tablet by mouth daily.    [provider]  furosemide (LASIX) 40 MG tablet Take 40 mg by mouth daily. 08/22/22   [provider]  HYDROcodone-acetaminophen (NORCO/VICODIN) 5-325 MG tablet Take 1 tablet by mouth every 6 (six) hours as needed for moderate pain or severe pain. 11/17/22   [provider]  lidocaine (XYLOCAINE) 2 % solution Use as directed 15 mLs in the mouth or throat as needed for mouth pain. Swallow 20 mi prior to meals 08/22/22   Antony Blackbird, MD  metFORMIN (GLUCOPHAGE) 500 MG tablet Take 500 mg by mouth in the morning.    [provider]  metoprolol succinate (TOPROL-XL) 25 MG 24 hr tablet Take 25 mg by mouth daily. 06/20/22   [provider]  Omega-3 Fatty Acids (OMEGA-3 PO) Take 1 capsule by mouth in the morning. OmegaXL Joint Pain Relief & Inflammation Supplement    [provider]  omeprazole (PRILOSEC) 40 MG capsule Take 40 mg by mouth daily as needed (indigestion.). 05/25/22   [provider]  potassium chloride (KLOR-CON) 10 MEQ tablet Take 10 mEq by mouth daily. 06/13/22   [provider]  prochlorperazine (COMPAZINE) 10 MG tablet Take 1 tablet (10 mg total) by mouth every 6 (six) hours as needed for nausea or vomiting. 06/26/22   Si Gaul, MD  Study - OCEANIC-STROKE - asundexian 50 mg or placebo tablet (PI-Sethi) Take 1 tablet (50 mg total) by mouth daily. For Investigational Use Only. Take at the same time each day (preferably in the morning). Tablet should be swallowed whole with water; it CANNOT be crushed or broken. Patient not taking: Reported on 07/07/2022 03/16/22   Osvaldo Shipper, MD  sucralfate (CARAFATE) 1 g tablet Take 1 tablet (1 g total) by mouth 4 (four) times daily -  with meals and at bedtime. Crush and dissolve in  10 mL's of warm water, swallow 30 min prior to meals and bedtime 08/08/22   Antony Blackbird, MD  traMADol (ULTRAM) 50 MG tablet Take 1 tablet (50 mg total) by mouth 2 (two) times daily as needed. 11/21/22   Dione Booze, MD  traZODone (DESYREL) 50 MG tablet Take 50 mg by mouth at bedtime as needed. 10/18/22   [provider]    Physical Exam: Vitals:   12/09/22 1026 12/09/22 1026 12/09/22 1245 12/09/22 1556  BP: (!) 137/100 (!) 137/100 (!) 116/100 (!) 140/107  Pulse: (!) 113  (!) 113 (!) 113  Resp: (!) 32 19 (!) 22 (!) 22  Temp:  98.3 F (36.8 C)    TempSrc:      SpO2: 97% 97% 97% 95%  Weight:      Height:        Physical Exam Vitals and nursing note reviewed.  Constitutional:      General: He is not in acute distress.    Appearance: He is not toxic-appearing or diaphoretic.     Comments: Appears chronically ill  HENT:     Head: Normocephalic and atraumatic.  Cardiovascular:     Rate and Rhythm: Normal rate and regular rhythm.  Pulmonary:     Effort: Pulmonary effort is normal.      Breath sounds: Normal breath sounds.  Abdominal:     General: Abdomen is flat. There is no distension.  Musculoskeletal:     Comments: Wearing TLSO brace  Neurological:     General: No focal deficit present.     Mental Status: He is alert and oriented to person, place, and time.      Labs on Admission: I have personally reviewed following labs and imaging studies  CBC: Recent Labs  Lab 12/09/22 0018  WBC 8.9  NEUTROABS 7.0  HGB 15.4  HCT 46.9  MCV 102.6*  PLT 239   Basic Metabolic Panel: Recent Labs  Lab 12/09/22 0018  NA 139  K 3.8  CL 113*  CO2 15*  GLUCOSE 106*  BUN 22  CREATININE 1.10  CALCIUM 11.2*   GFR: Estimated Creatinine Clearance: 66.6 mL/min (by C-G formula based on SCr of 1.1 mg/dL). Liver Function Tests: Recent Labs  Lab 12/09/22 0018  AST 13*  ALT 14  ALKPHOS 141*  BILITOT 0.7  PROT 7.8  ALBUMIN 3.4*   Cardiac Enzymes: Recent Labs  Lab 12/09/22 0018 12/09/22 0530  TROPONINIHS 7 7   BNP (last 3 results) Recent Labs    12/09/22 0018  BNP 21.5   Radiological Exams on Admission: I have personally reviewed images CT ABDOMEN PELVIS W CONTRAST  Result Date: 12/09/2022 CLINICAL DATA:  72 year old male with back pain after lifting. Non-small cell lung cancer. Evidence of skeletal metastases, acute and nonacute thoracic compression fractures on CT thoracic and lumbar spine earlier today. EXAM: CT ABDOMEN AND PELVIS WITH CONTRAST TECHNIQUE: Multidetector CT imaging of the abdomen and pelvis was performed using the standard protocol following bolus administration of intravenous contrast. RADIATION DOSE REDUCTION: This exam was performed according to the departmental dose-optimization program which includes automated exposure control, adjustment of the mA and/or kV according to patient size and/or use of iterative reconstruction technique. CONTRAST:  OMNIPAQUE IOHEXOL 350 MG/ML SOLN COMPARISON:  PET-CT 04/21/2022. Lumbar spine CT 0025 hours  today. CTA chest reported separately. FINDINGS: Lower chest: CTA chest reported separately. Hepatobiliary: Multiple hypodense and ill-defined liver metastases are new from the December PET-CT. The largest is in the medial left hepatic  lobe, abutting the hepatic veins and up to 4.2 cm long axis (coronal image 94). At least 4 individual metastases are identified. Gallbladder is distended but otherwise within normal limits. Pancreas: Partial pancreatic atrophy and some pancreatic ductal dilatation but no mass or inflammation. Spleen: Negative. Adrenals/Urinary Tract: 1 cm hypoenhancing area in the left adrenal gland which appears slightly enlarged since last year is suspicious for early adrenal metastasis on series 5, image 17. Right adrenal gland is stable. Multiple simple renal cysts (no follow-up imaging recommended). Nonobstructed kidneys with symmetric renal enhancement. No hydroureter. Negative bladder. Stomach/Bowel: Large bowel redundancy and diverticulosis. No active large bowel inflammation. No dilated large or small bowel. Decompressed terminal ileum. Appendix not identified, may be diminutive or absent. Decompressed stomach. Complex duodenal diverticula in the midline and at the junction with the ligament of Treitz, individually these are nearly 7 cm with some retained food/secretions. But there is no regional inflammation. Underlying duodenum and proximal small bowel remain decompressed. And there are occasional additional small bowel diverticula. No free fluid or free air. Vascular/Lymphatic: Infrarenal abdominal aortic aneurysm, up to 3.6 cm with mural plaque or thrombus on series 5, image 38. This is stable since last year. Underlying extensive Aortoiliac calcified atherosclerosis. Major arterial structures in the abdomen and pelvis remain patent from last year. Early femoral venous contrast timing, mixing artifact. No lymphadenopathy identified. Reproductive: Negative. Other: No free fluid in the  pelvis. Musculoskeletal: Thoracic spine and lumbar spine stable from dedicated CTs earlier today. Lytic new left medial iliac bone metastasis on series 5, image 43, approximately 18 mm. Similar contralateral medial right iliac bone metastasis on the same image. These are new by CT from the PET-CT last year. No other obvious pelvic bone metastasis. IMPRESSION: 1. Evidence of progressed Metastatic Lung Cancer from December PET-CT: - new liver metastases (up to 4.2 cm). - suspicion of early new left adrenal metastasis. - new iliac bone metastases. 2. No superimposed inflammatory process identified in the abdomen or pelvis. Large and small bowel diverticulosis. 3. Stable Infrarenal Abdominal Aortic Aneurysm, 3.6 cm. Recommend follow-up every 2 years. Reference: J Am Coll Radiol 2013;10:789-794. Aortic Atherosclerosis (ICD10-I70.0). aortic Atherosclerosis (ICD10-I70.0). Electronically Signed   By: Odessa Fleming M.D.   On: 12/09/2022 06:41   CT Angio Chest PE W and/or Wo Contrast  Result Date: 12/09/2022 CLINICAL DATA:  72 year old male with back pain after lifting. Non-small cell lung cancer. Evidence of skeletal metastases, acute and nonacute thoracic compression fractures on CT thoracic and lumbar spine earlier today. EXAM: CT ANGIOGRAPHY CHEST WITH CONTRAST TECHNIQUE: Multidetector CT imaging of the chest was performed using the standard protocol during bolus administration of intravenous contrast. Multiplanar CT image reconstructions and MIPs were obtained to evaluate the vascular anatomy. RADIATION DOSE REDUCTION: This exam was performed according to the departmental dose-optimization program which includes automated exposure control, adjustment of the mA and/or kV according to patient size and/or use of iterative reconstruction technique. CONTRAST:  OMNIPAQUE IOHEXOL 350 MG/ML SOLN COMPARISON:  Thoracic spine CT 0024 hours today. Chest CT 08/25/2022 without contrast. FINDINGS: Cardiovascular: Excellent contrast  bolus timing in the pulmonary arterial tree. Respiratory motion in the middle and lower lobes. Elsewhere no pulmonary artery filling defect identified. Little to no aortic contrast. Stable tortuosity of the aorta. Calcified aortic atherosclerosis. No cardiomegaly or pericardial effusion. Mediastinum/Nodes: Asymmetric right hilar soft tissue on series 2, image 60. But overall mediastinal lymphadenopathy appears regressed compared to PET-CT 04/21/2022. Lungs/Pleura: Major airways remain patent with new confluent and somewhat  linear arrangement of right medial lung peribronchial and parenchymal opacity which is probably sequelae of radiation pneumonitis. Underlying centrilobular emphysema. No pneumothorax or pleural effusion. No acute finding in the left lung. Upper Abdomen: CT Abdomen and Pelvis is reported separately. Musculoskeletal: Thoracic spine is stable from the CT at 0024 hours today. Evidence of lytic vertebral metastatic disease at T9 and T10, probably also T12. Review of the MIP images confirms the above findings. IMPRESSION: 1. Negative for acute pulmonary embolus. 2. 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 And malignant mediastinal lymphadenopathy appears regressed from last year. 3. Skeletal metastatic disease, new/progressed from last year including left 5th rib, T9 and T10 lytic metastases. Thoracic spine stable from dedicated thoracic CT earlier today. 4. CT Abdomen and Pelvis is reported separately. 5. Aortic Atherosclerosis (ICD10-I70.0) and Emphysema (ICD10-J43.9). Electronically Signed   By: Odessa Fleming M.D.   On: 12/09/2022 06:32   CT Thoracic Spine Wo Contrast  Result Date: 12/09/2022 CLINICAL DATA:  Low back pain, lifting injury, fracture on prior MRI. Known primary lung cancer when correlating with prior studies. EXAM: CT THORACIC SPINE WITHOUT CONTRAST TECHNIQUE: Multidetector CT images of the thoracic were obtained using the standard protocol without intravenous  contrast. RADIATION DOSE REDUCTION: This exam was performed according to the departmental dose-optimization program which includes automated exposure control, adjustment of the mA and/or kV according to patient size and/or use of iterative reconstruction technique. COMPARISON:  MRI lumbar spine dated 10/22/2022. CT chest dated 08/25/2022 FINDINGS: Alignment: Normal thoracic kyphosis. Vertebrae: 9 mm lytic lesion/metastasis along the left inferior aspect of the T9 vertebral body (series 8/image 91). This is new from prior CT. Mild superior endplate compression fracture deformity at T10, with 20% loss of height. Associated lytic lesions/metastasis involving the left transverse process and adjacent medial left 10th rib (series 8/image 96), measuring approximately 3.8 x 2.5 cm in aggregate. This is new from prior CT. Mild superior endplate compression fracture deformity at T12, similar to prior MRI, new from prior CT. Associated 3.1 cm lytic metastasis in the left superior vertebral body (series 8/image 124), new from prior CT although likely present (although less conspicuous) on prior MRI. Paraspinal and other soft tissues: Patchy right lung opacities (for example, series 2/image 68), new. Mild centrilobular and paraseptal emphysematous changes. Trace right pleural effusion. Atherosclerotic calcifications of the aortic arch. Disc levels: T10 and T12 compression fracture deformities, described above. No retropulsion. Mild degenerative changes of the mid/lower thoracic spine. IMPRESSION: Mild superior endplate pathologic compression fracture deformities at T10, new from prior CT. Mild superior endplate pathologic compression fracture deformity at T12, unchanged from a recent MR, new from prior CT. Associated lytic metastases, as above. Associated lytic metastases at T9, T10, and T12, as above, new from prior CT. Patchy right lung opacities, new, poorly evaluated. Trace right pleural effusion. Consider dedicated CT chest  with contrast for further evaluation. Aortic Atherosclerosis (ICD10-I70.0) and Emphysema (ICD10-J43.9). Electronically Signed   By: Charline Bills M.D.   On: 12/09/2022 01:02   CT Lumbar Spine Wo Contrast  Result Date: 12/09/2022 CLINICAL DATA:  Low back pain, lifting injury EXAM: CT LUMBAR SPINE WITHOUT CONTRAST TECHNIQUE: Multidetector CT imaging of the lumbar spine was performed without intravenous contrast administration. Multiplanar CT image reconstructions were also generated. RADIATION DOSE REDUCTION: This exam was performed according to the departmental dose-optimization program which includes automated exposure control, adjustment of the mA and/or kV according to patient size and/or use of iterative reconstruction technique. COMPARISON:  MRI lumbar spine  dated 10/22/2022 FINDINGS: Segmentation: 5 lumbar type vertebral bodies. Alignment: Normal lumbar lordosis. Vertebrae: No acute fracture or focal pathologic process. Paraspinal and other soft tissues: Left renal cysts incompletely visualized and poorly evaluated. 3 mm nonobstructing left upper pole renal calculus. Atherosclerotic calcifications of the abdominal aorta and branch vessels. Disc levels: Mild to moderate degenerative changes, most prominent at L1-2. Spinal canal is patent. IMPRESSION: No traumatic injury to the lumbar spine. Mild to moderate degenerative changes, as above. Electronically Signed   By: Charline Bills M.D.   On: 12/09/2022 00:46    EKG: My personal interpretation of EKG shows: sinus tachycardia    Assessment/Plan Active Problems:   Pathologic thoracic fracture   Metastatic cancer to liver [facility])    Assessment and Plan: Metastatic cancer to liver [facility]) Has multiple mets to liver, bone, axial skeleton. Pt needs to f/u with oncologist  Pathologic thoracic fracture Pt has TLSO brace. Will treat with po dilaudid. BP and HR much improved after pain control and dose of IV lopressor. Pt need to see his oncologist  in clinic regarding his metastatic liver lesions. Have forwarded a copy of his CT abd report to his oncologist   Family Communication: no family at bedside  Disposition Plan: return home  Admission status:  pt does not require hospital admission. Pt's tachycardia and elevated BP due to poorly treated pain from his thoracic fracture.  After only 1 dose of IV dilaudid and 1 dose of IV lopressor, pt's tachycardia has resolved and pain is improved. ,  Pt stable for discharge to home with Rx for Dilaudid 2 mg #30 take 1 tab q4h prn pain. Zofran prn for nausea.  Carollee Herter, DO [facility] 12/09/2022, 4:30 PM

## 2022-12-09 NOTE — ED Provider Notes (Addendum)
[place] EMERGENCY DEPARTMENT AT [facility] Provider Note   CSN: [id number] Arrival date & time: 12/08/22  2208     History  Chief Complaint  Patient presents with   Back Pain    Gavin Dennis is a 72 y.o. male with PMHx CVA, DM, HLD, HTN, metastatic cancer who presents to ED concerned for lower back pain since picking up a box 3 months ago and feeling a "pop". At that time patient was diagnosed with lumbar fractures but provider would not perform surgery on patient. Patient also diagnosed with cancer around this time and received chemo and radiation treatments which finished a couple of months ago. No new trauma. Patient here because he is still in the same amount of severe pain as he was 3 months ago.  Of note, patient following up with Neurosurgery provider in 3 days for his back pain.  Patient denies urinary retention, fecal incontinence, saddle anesthesia, lower extremity weakness, IVDU, recent spinal procedure. Denies fever, chest pain, dyspnea, abdominal pain, nausea, vomiting, diarrhea.   Back Pain      Home Medications Prior to Admission medications   Medication Sig Start Date End Date Taking? Authorizing Provider  amLODipine (NORVASC) 10 MG tablet Take 1 tablet (10 mg total) by mouth daily. 11/15/20 05/16/26  Grayce Sessions, NP  atorvastatin (LIPITOR) 20 MG tablet Take 20 mg by mouth in the morning. 04/06/22   [provider]  BREZTRI AEROSPHERE 160-9-4.8 MCG/ACT AERO Inhale 2 puffs into the lungs daily. 02/27/22   [provider]  clopidogrel (PLAVIX) 75 MG tablet Take 1 tablet (75 mg total) by mouth daily. 03/16/22   Osvaldo Shipper, MD  Cyanocobalamin (VITAMIN B-12 PO) Take 1 tablet by mouth daily.    [provider]  furosemide (LASIX) 40 MG tablet Take 40 mg by mouth daily. 08/22/22   [provider]  HYDROcodone-acetaminophen (NORCO/VICODIN) 5-325 MG tablet Take 1 tablet by mouth every 6 (six) hours as needed for  moderate pain or severe pain. 11/17/22   [provider]  lidocaine (XYLOCAINE) 2 % solution Use as directed 15 mLs in the mouth or throat as needed for mouth pain. Swallow 20 mi prior to meals 08/22/22   Antony Blackbird, MD  metFORMIN (GLUCOPHAGE) 500 MG tablet Take 500 mg by mouth in the morning.    [provider]  metoprolol succinate (TOPROL-XL) 25 MG 24 hr tablet Take 25 mg by mouth daily. 06/20/22   [provider]  Omega-3 Fatty Acids (OMEGA-3 PO) Take 1 capsule by mouth in the morning. OmegaXL Joint Pain Relief & Inflammation Supplement    [provider]  omeprazole (PRILOSEC) 40 MG capsule Take 40 mg by mouth daily as needed (indigestion.). 05/25/22   [provider]  potassium chloride (KLOR-CON) 10 MEQ tablet Take 10 mEq by mouth daily. 06/13/22   [provider]  prochlorperazine (COMPAZINE) 10 MG tablet Take 1 tablet (10 mg total) by mouth every 6 (six) hours as needed for nausea or vomiting. 06/26/22   Si Gaul, MD  Study - OCEANIC-STROKE - asundexian 50 mg or placebo tablet (PI-Sethi) Take 1 tablet (50 mg total) by mouth daily. For Investigational Use Only. Take at the same time each day (preferably in the morning). Tablet should be swallowed whole with water; it CANNOT be crushed or broken. Patient not taking: Reported on 07/07/2022 03/16/22   Osvaldo Shipper, MD  sucralfate (CARAFATE) 1 g tablet Take 1 tablet (1 g total) by mouth 4 (four) times  daily -  with meals and at bedtime. Crush and dissolve in 10 mL's of warm water, swallow 30 min prior to meals and bedtime 08/08/22   Antony Blackbird, MD  traMADol (ULTRAM) 50 MG tablet Take 1 tablet (50 mg total) by mouth 2 (two) times daily as needed. 11/21/22   Dione Booze, MD  traZODone (DESYREL) 50 MG tablet Take 50 mg by mouth at bedtime as needed. 10/18/22   [provider]      Allergies    Patient has no known allergies.    Review of Systems   Review of Systems   Musculoskeletal:  Positive for back pain.    Physical Exam Updated Vital Signs BP (!) 137/100 (BP Location: Left Arm)   Pulse (!) 113   Temp 98.3 F (36.8 C)   Resp 19   Ht 5\' 11"  (1.803 m)   Wt 77.1 kg   SpO2 97%   BMI 23.71 kg/m  Physical Exam Vitals and nursing note reviewed.  Constitutional:      General: He is not in acute distress.    Appearance: He is not ill-appearing or toxic-appearing.  HENT:     Head: Normocephalic and atraumatic.     Mouth/Throat:     Mouth: Mucous membranes are moist.  Eyes:     General: No scleral icterus.       Right eye: No discharge.        Left eye: No discharge.     Conjunctiva/sclera: Conjunctivae normal.  Cardiovascular:     Rate and Rhythm: Normal rate and regular rhythm.     Pulses: Normal pulses.     Heart sounds: Normal heart sounds. No murmur heard. Pulmonary:     Effort: Pulmonary effort is normal.  Abdominal:     General: Abdomen is flat. Bowel sounds are normal.     Palpations: Abdomen is soft.     Tenderness: There is no abdominal tenderness.  Musculoskeletal:     Right lower leg: No edema.     Left lower leg: No edema.  Skin:    General: Skin is warm and dry.     Findings: No rash.  Neurological:     General: No focal deficit present.     Mental Status: He is alert. Mental status is at baseline.     Comments: GCS 15. Speech is goal oriented. 5/5 strength against resistance in LE major muscle groups bilaterally. Sensation to light touch intact. Patient moves extremities without ataxia.    Psychiatric:        Mood and Affect: Mood normal.        Behavior: Behavior normal.     ED Results / Procedures / Treatments   Labs (all labs ordered are listed, but only abnormal results are displayed) Labs Reviewed  CBC WITH DIFFERENTIAL/PLATELET - Abnormal; Notable for the following components:      Result Value   MCV 102.6 (*)    RDW 16.5 (*)    All other components within normal limits  COMPREHENSIVE METABOLIC  PANEL - Abnormal; Notable for the following components:   Chloride 113 (*)    CO2 15 (*)    Glucose, Bld 106 (*)    Calcium 11.2 (*)    Albumin 3.4 (*)    AST 13 (*)    Alkaline Phosphatase 141 (*)    All other components within normal limits  BRAIN NATRIURETIC PEPTIDE  TROPONIN I (HIGH SENSITIVITY)  TROPONIN I (HIGH SENSITIVITY)    EKG None  Radiology CT ABDOMEN PELVIS W CONTRAST  Result Date: 12/09/2022 CLINICAL DATA:  72 year old male with back pain after lifting. Non-small cell lung cancer. Evidence of skeletal metastases, acute and nonacute thoracic compression fractures on CT thoracic and lumbar spine earlier today. EXAM: CT ABDOMEN AND PELVIS WITH CONTRAST TECHNIQUE: Multidetector CT imaging of the abdomen and pelvis was performed using the standard protocol following bolus administration of intravenous contrast. RADIATION DOSE REDUCTION: This exam was performed according to the departmental dose-optimization program which includes automated exposure control, adjustment of the mA and/or kV according to patient size and/or use of iterative reconstruction technique. CONTRAST:  OMNIPAQUE IOHEXOL 350 MG/ML SOLN COMPARISON:  PET-CT 04/21/2022. Lumbar spine CT 0025 hours today. CTA chest reported separately. FINDINGS: Lower chest: CTA chest reported separately. Hepatobiliary: Multiple hypodense and ill-defined liver metastases are new from the December PET-CT. The largest is in the medial left hepatic lobe, abutting the hepatic veins and up to 4.2 cm long axis (coronal image 94). At least 4 individual metastases are identified. Gallbladder is distended but otherwise within normal limits. Pancreas: Partial pancreatic atrophy and some pancreatic ductal dilatation but no mass or inflammation. Spleen: Negative. Adrenals/Urinary Tract: 1 cm hypoenhancing area in the left adrenal gland which appears slightly enlarged since last year is suspicious for early adrenal metastasis on series 5, image  17. Right adrenal gland is stable. Multiple simple renal cysts (no follow-up imaging recommended). Nonobstructed kidneys with symmetric renal enhancement. No hydroureter. Negative bladder. Stomach/Bowel: Large bowel redundancy and diverticulosis. No active large bowel inflammation. No dilated large or small bowel. Decompressed terminal ileum. Appendix not identified, may be diminutive or absent. Decompressed stomach. Complex duodenal diverticula in the midline and at the junction with the ligament of Treitz, individually these are nearly 7 cm with some retained food/secretions. But there is no regional inflammation. Underlying duodenum and proximal small bowel remain decompressed. And there are occasional additional small bowel diverticula. No free fluid or free air. Vascular/Lymphatic: Infrarenal abdominal aortic aneurysm, up to 3.6 cm with mural plaque or thrombus on series 5, image 38. This is stable since last year. Underlying extensive Aortoiliac calcified atherosclerosis. Major arterial structures in the abdomen and pelvis remain patent from last year. Early femoral venous contrast timing, mixing artifact. No lymphadenopathy identified. Reproductive: Negative. Other: No free fluid in the pelvis. Musculoskeletal: Thoracic spine and lumbar spine stable from dedicated CTs earlier today. Lytic new left medial iliac bone metastasis on series 5, image 43, approximately 18 mm. Similar contralateral medial right iliac bone metastasis on the same image. These are new by CT from the PET-CT last year. No other obvious pelvic bone metastasis. IMPRESSION: 1. Evidence of progressed Metastatic Lung Cancer from December PET-CT: - new liver metastases (up to 4.2 cm). - suspicion of early new left adrenal metastasis. - new iliac bone metastases. 2. No superimposed inflammatory process identified in the abdomen or pelvis. Large and small bowel diverticulosis. 3. Stable Infrarenal Abdominal Aortic Aneurysm, 3.6 cm. Recommend  follow-up every 2 years. Reference: J Am Coll Radiol 2013;10:789-794. Aortic Atherosclerosis (ICD10-I70.0). aortic Atherosclerosis (ICD10-I70.0). Electronically Signed   By: Odessa Fleming M.D.   On: 12/09/2022 06:41   CT Angio Chest PE W and/or Wo Contrast  Result Date: 12/09/2022 CLINICAL DATA:  72 year old male with back pain after lifting. Non-small cell lung cancer. Evidence of skeletal metastases, acute and nonacute thoracic compression fractures on CT thoracic and lumbar spine earlier today. EXAM: CT ANGIOGRAPHY CHEST WITH CONTRAST TECHNIQUE: Multidetector CT imaging of the chest was  performed using the standard protocol during bolus administration of intravenous contrast. Multiplanar CT image reconstructions and MIPs were obtained to evaluate the vascular anatomy. RADIATION DOSE REDUCTION: This exam was performed according to the departmental dose-optimization program which includes automated exposure control, adjustment of the mA and/or kV according to patient size and/or use of iterative reconstruction technique. CONTRAST:  OMNIPAQUE IOHEXOL 350 MG/ML SOLN COMPARISON:  Thoracic spine CT 0024 hours today. Chest CT 08/25/2022 without contrast. FINDINGS: Cardiovascular: Excellent contrast bolus timing in the pulmonary arterial tree. Respiratory motion in the middle and lower lobes. Elsewhere no pulmonary artery filling defect identified. Little to no aortic contrast. Stable tortuosity of the aorta. Calcified aortic atherosclerosis. No cardiomegaly or pericardial effusion. Mediastinum/Nodes: Asymmetric right hilar soft tissue on series 2, image 60. But overall mediastinal lymphadenopathy appears regressed compared to PET-CT 04/21/2022. Lungs/Pleura: Major airways remain patent with new confluent and somewhat linear arrangement of right medial lung peribronchial and parenchymal opacity which is probably sequelae of radiation pneumonitis. Underlying centrilobular emphysema. No pneumothorax or pleural  effusion. No acute finding in the left lung. Upper Abdomen: CT Abdomen and Pelvis is reported separately. Musculoskeletal: Thoracic spine is stable from the CT at 0024 hours today. Evidence of lytic vertebral metastatic disease at T9 and T10, probably also T12. Review of the MIP images confirms the above findings. IMPRESSION: 1. Negative for acute pulmonary embolus. 2. 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 And malignant mediastinal lymphadenopathy appears regressed from last year. 3. Skeletal metastatic disease, new/progressed from last year including left 5th rib, T9 and T10 lytic metastases. Thoracic spine stable from dedicated thoracic CT earlier today. 4. CT Abdomen and Pelvis is reported separately. 5. Aortic Atherosclerosis (ICD10-I70.0) and Emphysema (ICD10-J43.9). Electronically Signed   By: Odessa Fleming M.D.   On: 12/09/2022 06:32   CT Thoracic Spine Wo Contrast  Result Date: 12/09/2022 CLINICAL DATA:  Low back pain, lifting injury, fracture on prior MRI. Known primary lung cancer when correlating with prior studies. EXAM: CT THORACIC SPINE WITHOUT CONTRAST TECHNIQUE: Multidetector CT images of the thoracic were obtained using the standard protocol without intravenous contrast. RADIATION DOSE REDUCTION: This exam was performed according to the departmental dose-optimization program which includes automated exposure control, adjustment of the mA and/or kV according to patient size and/or use of iterative reconstruction technique. COMPARISON:  MRI lumbar spine dated 10/22/2022. CT chest dated 08/25/2022 FINDINGS: Alignment: Normal thoracic kyphosis. Vertebrae: 9 mm lytic lesion/metastasis along the left inferior aspect of the T9 vertebral body (series 8/image 91). This is new from prior CT. Mild superior endplate compression fracture deformity at T10, with 20% loss of height. Associated lytic lesions/metastasis involving the left transverse process and adjacent medial left 10th rib  (series 8/image 96), measuring approximately 3.8 x 2.5 cm in aggregate. This is new from prior CT. Mild superior endplate compression fracture deformity at T12, similar to prior MRI, new from prior CT. Associated 3.1 cm lytic metastasis in the left superior vertebral body (series 8/image 124), new from prior CT although likely present (although less conspicuous) on prior MRI. Paraspinal and other soft tissues: Patchy right lung opacities (for example, series 2/image 68), new. Mild centrilobular and paraseptal emphysematous changes. Trace right pleural effusion. Atherosclerotic calcifications of the aortic arch. Disc levels: T10 and T12 compression fracture deformities, described above. No retropulsion. Mild degenerative changes of the mid/lower thoracic spine. IMPRESSION: Mild superior endplate pathologic compression fracture deformities at T10, new from prior CT. Mild superior endplate pathologic compression fracture deformity at  T12, unchanged from a recent MR, new from prior CT. Associated lytic metastases, as above. Associated lytic metastases at T9, T10, and T12, as above, new from prior CT. Patchy right lung opacities, new, poorly evaluated. Trace right pleural effusion. Consider dedicated CT chest with contrast for further evaluation. Aortic Atherosclerosis (ICD10-I70.0) and Emphysema (ICD10-J43.9). Electronically Signed   By: Charline Bills M.D.   On: 12/09/2022 01:02   CT Lumbar Spine Wo Contrast  Result Date: 12/09/2022 CLINICAL DATA:  Low back pain, lifting injury EXAM: CT LUMBAR SPINE WITHOUT CONTRAST TECHNIQUE: Multidetector CT imaging of the lumbar spine was performed without intravenous contrast administration. Multiplanar CT image reconstructions were also generated. RADIATION DOSE REDUCTION: This exam was performed according to the departmental dose-optimization program which includes automated exposure control, adjustment of the mA and/or kV according to patient size and/or use of iterative  reconstruction technique. COMPARISON:  MRI lumbar spine dated 10/22/2022 FINDINGS: Segmentation: 5 lumbar type vertebral bodies. Alignment: Normal lumbar lordosis. Vertebrae: No acute fracture or focal pathologic process. Paraspinal and other soft tissues: Left renal cysts incompletely visualized and poorly evaluated. 3 mm nonobstructing left upper pole renal calculus. Atherosclerotic calcifications of the abdominal aorta and branch vessels. Disc levels: Mild to moderate degenerative changes, most prominent at L1-2. Spinal canal is patent. IMPRESSION: No traumatic injury to the lumbar spine. Mild to moderate degenerative changes, as above. Electronically Signed   By: Charline Bills M.D.   On: 12/09/2022 00:46    Procedures Procedures    Medications Ordered in ED Medications  metoprolol succinate (TOPROL-XL) 24 hr tablet 25 mg (25 mg Oral Given 12/09/22 1149)  acetaminophen (TYLENOL) tablet 650 mg (has no administration in time range)  iohexol (OMNIPAQUE) 350 MG/ML injection 100 mL (100 mLs Intravenous Contrast Given 12/09/22 0523)  oxyCODONE-acetaminophen (PERCOCET/ROXICET) 5-325 MG per tablet 1 tablet (1 tablet Oral Given 12/09/22 0759)  sodium chloride 0.9 % bolus 1,000 mL (1,000 mLs Intravenous New Bag/Given 12/09/22 0800)    ED Course/ Medical Decision Making/ A&P                                 Medical Decision Making Amount and/or Complexity of Data Reviewed Labs: ordered. Radiology: ordered. ECG/medicine tests: ordered.  Risk OTC drugs. Prescription drug management.   This patient presents to the ED for concern of back pain, this involves an extensive number of treatment options, and is a complaint that carries with it a high risk of complications and morbidity.  The differential diagnosis includes pyelonephritis, nephrolithiasis, spinal abscess, osteomyelitis, herniated disc, muscle strain, spinal fracture, meningitis, cancer, cauda equina syndrome.   Co morbidities that  complicate the patient evaluation  CVA, DM, HLD, HTN, metastatic cancer   Lab Tests:  I Ordered, and personally interpreted labs.  The pertinent results include:   -Troponin: Within normal limits - BNP: Within normal limits - CMP: chloride mildly elevated, ALT elevated - CBC: No concern for anemia or leukocytosis   Imaging Studies ordered:  I ordered imaging studies including  - CT abd/pelv, CT angio chest, CT thoracic/lumbar spine: to assess for process contributing to patient's symptoms I independently visualized and interpreted imaging I agree with the radiologist interpretation   Cardiac Monitoring: / EKG:  The patient was maintained on a cardiac monitor.  I personally viewed and interpreted the cardiac monitored which showed an underlying rhythm of: tachycardia without ST changes or arrythmias   Problem List / ED Course /  Critical interventions / Medication management  Patient presents ED concern for back pain. Physical exam without saddle paresthesia or weakness of LE bilaterally. Patient with tachycardia and tachypnea. Provided patient with pain management and IV fluids which mildly helped tachycardia. Patient also has not taken his daily dose of metoprolol and tachycardia could also be due to this - provided metoprolol in ED which did not resolve tachycardia. Got patient up to walk and tachycardia immediately elevated to 130's. CBC without leukocytosis or anemia. BNP within normal limits CMP with low bicarb.  VBG with normal pH. LA within normal limits.  CT thoracic showing mild superior endplate pathologic compression fracture deformities at T10, new from prior CT. Mild superior endplate pathologic compression fracture deformity at T12, unchanged from a recent MR, new from prior CT. CT lumbar without acute fractures or dislocations. CT chest showing radiation pneumonitis - no concern for PE. CT abd/pelv showing patient's metastatic disease including new liver metastases.  Shared findings with patient who states that he has a follow up appointment with provider 12/12/2022. Provided patient with TLSO back brace.  Given patient's metastatic disease and tachycardia of unknown origin, Dr. Posey Rea and I believe it is in patient's best interest to be admitted. Consulted Dr. [facility]) who agrees to see patient in ED.   Ddx: these are considered less likely due to history of present illness and physical exam -Pyelonephritis/nephrolithiasis: no abdominal or flank pain; no urinary symptoms -spinal abscess: CT without concern -osteomyelitis: CT without concern -meningitis: no fever; lack of meningism symptoms -cauda equina syndrome: denies saddle paresthesia, urinary retention, fecal incontinence   Social Determinants of Health:  none        Final Clinical Impression(s) / ED Diagnoses Final diagnoses:  Compression fracture of body of thoracic vertebra [facility])    Rx / DC Orders ED Discharge Orders     None         Margarita Rana 12/09/22 1530    Glendora Score, MD 12/10/22 1815

## 2022-12-09 NOTE — Subjective & Objective (Signed)
CC: back pain HPI:  72 year old African-American male history of non-small lung cancer status post chemo and radiation presents to the ER today with worsening back pain.  Patient states that he has had back pain for about 3 months.  He states that he felt a pop in his back about 3 months ago when he was lifting some boxes.  He was scheduled to have surgery with Dr. Franky Macho in July 2024 but it was canceled.  Patient saw his PCP and received a prescription for hydrocodone.  He states this medication is not helping.  He presented by EMS yesterday evening.  CT scan chest abdomen pelvis  Showed negative PE.  He had a T10 and a T12 compression fracture along with lytic lesions consistent with metastasis.  CT also showed multiple liver lesions.  There is also a left adrenal mass.  Patient was only given 1 dose of occasion of Percocet at 8 AM.  [facility] hospitalist consulted at 3:11 PM.  Patient was tachycardic but has not had any of his home hypertension medications last night.  He was given 1 dose of Toprol XL 25 mg at 11:50 AM.  Patient was still in a lot of pain.  A TLSO brace was already applied to him but he was still complaining of lots of pain.  After 1 dose of IV Dilaudid and 5 mg of IV Lopressor, blood pressure, heart rate have all improved.  His pain score is now 0.  Patient would like a prescription for p.o. Dilaudid.  Patient is medically stable for discharge.  I have sent a copy of his CT report to his oncologist.  Patient states he has a follow-up appointment with Dr. Franky Macho with neurosurgery this coming Tuesday.

## 2022-12-09 NOTE — Discharge Instructions (Addendum)
It was a pleasure caring for you today.  You need follow-up with your spine surgery provider during your appointment this upcoming Tuesday. Your back brace is for symptom management. You may remove it for showers. Seek emergency care if experiencing any new or worsening symptoms.  Alternating between 650 mg Tylenol and 400 mg Advil: The best way to alternate taking Acetaminophen (example Tylenol) and Ibuprofen (example Advil/Motrin) is to take them 3 hours apart. For example, if you take ibuprofen at 6 am you can then take Tylenol at 9 am. You can continue this regimen throughout the day, making sure you do not exceed the recommended maximum dose for each drug.  You may also take prescribed occasion Dilaudid as well as Zofran for nausea.

## 2022-12-09 NOTE — Assessment & Plan Note (Signed)
Has multiple mets to liver, bone, axial skeleton. Pt needs to f/u with oncologist

## 2022-12-09 NOTE — ED Notes (Signed)
Pt can't stand or walk pt said he is still in pain and need some pill meds

## 2022-12-09 NOTE — ED Notes (Signed)
Ortho tech called to assist with brace.

## 2022-12-09 NOTE — ED Notes (Signed)
Cash and wallet given to security

## 2022-12-09 NOTE — Progress Notes (Signed)
Orthopedic Tech Progress Note Patient Details:  Meet Gavin Dennis 03/29/1951 [id number] Called [employer] for a TLSO brace. Patient ID: Gavin Dennis, male   DOB: Apr 09, 1951, 71 y.o.   MRN: [id number]  Gavin Dennis 12/09/2022, 3:51 PM

## 2022-12-09 NOTE — Assessment & Plan Note (Signed)
Pt has TLSO brace. Will treat with po dilaudid. BP and HR much improved after pain control and dose of IV lopressor. Pt need to see his oncologist in clinic regarding his metastatic liver lesions. Have forwarded a copy of his CT abd report to his oncologist

## 2022-12-09 NOTE — Progress Notes (Signed)
Orthopedic Tech Progress Note Patient Details:  Gavin Dennis October 24, 1950 [id number]  Ortho Devices Type of Ortho Device: Thoracolumbar corset (TLSO) Ortho Device/Splint Interventions: Application   Post Interventions Patient Tolerated: Well   E  12/09/2022, 8:30 AM

## 2022-12-09 NOTE — ED Provider Notes (Signed)
  Physical Exam  BP (!) 140/107   Pulse (!) 113   Temp 98.3 F (36.8 C)   Resp (!) 22   Ht 5\' 11"  (1.803 m)   Wt 77.1 kg   SpO2 95%   BMI 23.71 kg/m   Physical Exam Vitals and nursing note reviewed.  Constitutional:      General: He is not in acute distress.    Appearance: He is well-developed. He is not toxic-appearing.  HENT:     Head: Normocephalic and atraumatic.  Eyes:     Conjunctiva/sclera: Conjunctivae normal.  Cardiovascular:     Rate and Rhythm: Normal rate and regular rhythm.  Pulmonary:     Effort: No respiratory distress.  Abdominal:     Palpations: Abdomen is soft.     Tenderness: There is no abdominal tenderness.  Musculoskeletal:     Cervical back: Neck supple.  Skin:    General: Skin is warm and dry.     Capillary Refill: Capillary refill takes less than 2 seconds.     Coloration: Skin is not jaundiced or pale.  Neurological:     Mental Status: He is alert and oriented to person, place, and time.  Psychiatric:        Mood and Affect: Mood normal.        Behavior: Behavior normal.     Procedures  Procedures  ED Course / MDM      Medical Decision Making Amount and/or Complexity of Data Reviewed Labs: ordered. Radiology: ordered. ECG/medicine tests: ordered.  Risk OTC drugs. Prescription drug management. Decision regarding hospitalization.   72-year-old male signed out to me at shift change pending evaluation by hospitalist.  Please see previous provider note for further details.  In short, this is a 72 year old male who comes in with metastatic cancer.  Patient complaining of worsening back pain.  Imaging shows progression of disease.  Previous provider plan was to admit patient however hospitalist is unsure if patient meets admission criteria so signout stated that hospitalist would see the patient.  On chart review, it appears that the patient has been seen by the hospitalist, Dr. Imogene Burn, who does not feel that the patient needs inpatient  admission.  Dr. Imogene Burn has prescribed the patient Dilaudid and Zofran.  Patient tachycardia has resolved with proper pain management.  The patient will be discharged and advised to follow-up with his neurosurgeon Dr. Franky Macho on Tuesday.  Opportunity for questions was given to the patient all of his questions answered to his satisfaction.  The patient has no further questions and he will be stable to discharge him at this time.     Al Decant, PA-C 12/09/22 1652    Glyn Ade, MD 12/10/22 1504

## 2022-12-14 ENCOUNTER — Telehealth: Payer: Self-pay | Admitting: Internal Medicine

## 2022-12-14 NOTE — Telephone Encounter (Signed)
Scheduled per scheduling message, called and spoke with patient's daughter. Patient will be notified.

## 2022-12-17 NOTE — Progress Notes (Deleted)
[facility] OFFICE PROGRESS NOTE  Pahwani, [name], MD [address] [place] [address]  DIAGNOSIS: Metastatic lung cancer, initially diagnosed as stage IIIb (T0 N2, M0) non-small cell lung cancer, adenocarcinoma presented with right hilar and mediastinal lymphadenopathy with no clear primary diagnosed in February 2024.   Molecular studies by [id number] that showed positive KRAS G12C mutation and PD-L1 expression of 53%.  PRIOR THERAPY: Concurrent chemoradiation with weekly carboplatin for AUC of 2 and paclitaxel 45 Mg/M2.  Last dose on 08/21/2022 status post 5 cycles.   CURRENT THERAPY: ***   INTERVAL HISTORY: Gavin Dennis 72 y.o. male returns to clinic today for follow-up visit.  The patient was last seen in clinic by Dr. Arbutus Ped on 08/21/2022.  He appears that the patient has been lost to follow-up since that time.  He completed a course of concurrent chemoradiation.  He underwent a total of 5 cycles total.  Dr. Arbutus Ped recommended a restaging CT scan and a follow-up visit after that time.  It looks like staff members have been trying to reach the patient but his voicemail was not set up.  Additionally, the emergency contacts listed in epic were either incorrect or no longer had affiliations with the patient.  The patient did present to the emergency room on 11/20/2022 in the interval for constipation.  He also had back pain and compression fracture for which she was supposed to undergo kyphoplasty and was being treated with tramadol by ***.  He presented to the emergency room again for back pain on 12/09/2022.  He also was scheduled for follow-up within 3 days of his ER visit with neurosurgery, Dr. Franky Macho which she had an appointment on 8/13 sounds like the patient was not able to make this appointment.  As I do not see any notes.  He had imaging performed in the emergency room that showed mild superior endplate pathologic compression fractures and deformities at  T10 which is new from prior imaging.  There is also unchanged T12 compression fracture.  CT chest showed radiation pneumonitis without any evidence of PE.  The CT of the abdomen pelvis showed new metastatic disease including liver metastasis.  Otherwise the patient denies any fever, chills, or night sweats.  Weight and appetite? He denied having any chest pain or hemoptysis. He denies any significant dyspnea unless he is bending over. He denies any major cough. He has no nausea, vomiting, or constipation.  Diarrhea?  Denies any headache or visual changes.  Patient is here today for evaluation and for more detailed discussion about his current condition and next steps in his care. MEDICAL HISTORY: Past Medical History:  Diagnosis Date   Blurred vision, left eye    since stroke 06/2020   CVA (cerebral vascular accident) (HCC) 06/2020   DDD (degenerative disc disease), lumbar    Diabetes mellitus without complication (HCC)    History of radiation therapy    Right lung- 07/17/22-08/28/22- Dr. Antony Blackbird   Hyperlipidemia    Hypertension    Hypomagnesemia    Polio    childhood   Vitamin B 12 deficiency     ALLERGIES:  has No Known Allergies.  MEDICATIONS:  Current Outpatient Medications  Medication Sig Dispense Refill   amLODipine (NORVASC) 10 MG tablet Take 1 tablet (10 mg total) by mouth daily. 90 tablet 0   atorvastatin (LIPITOR) 20 MG tablet Take 20 mg by mouth in the morning.     BREZTRI AEROSPHERE 160-9-4.8 MCG/ACT AERO Inhale 2  puffs into the lungs daily.     clopidogrel (PLAVIX) 75 MG tablet Take 1 tablet (75 mg total) by mouth daily. 30 tablet 3   Cyanocobalamin (VITAMIN B-12 PO) Take 1 tablet by mouth daily.     furosemide (LASIX) 40 MG tablet Take 40 mg by mouth daily.     lidocaine (XYLOCAINE) 2 % solution Use as directed 15 mLs in the mouth or throat as needed for mouth pain. Swallow 20 mi prior to meals 100 mL 1   metFORMIN (GLUCOPHAGE) 500 MG tablet Take 500 mg by mouth in  the morning.     metoprolol succinate (TOPROL-XL) 25 MG 24 hr tablet Take 25 mg by mouth daily.     Omega-3 Fatty Acids (OMEGA-3 PO) Take 1 capsule by mouth in the morning. OmegaXL Joint Pain Relief & Inflammation Supplement     omeprazole (PRILOSEC) 40 MG capsule Take 40 mg by mouth daily as needed (indigestion.).     ondansetron (ZOFRAN) 4 MG tablet Take 1 tablet (4 mg total) by mouth daily as needed for nausea or vomiting. 30 tablet 0   potassium chloride (KLOR-CON) 10 MEQ tablet Take 10 mEq by mouth daily.     prochlorperazine (COMPAZINE) 10 MG tablet Take 1 tablet (10 mg total) by mouth every 6 (six) hours as needed for nausea or vomiting. 30 tablet 0   Study - OCEANIC-STROKE - asundexian 50 mg or placebo tablet (PI-Sethi) Take 1 tablet (50 mg total) by mouth daily. For Investigational Use Only. Take at the same time each day (preferably in the morning). Tablet should be swallowed whole with water; it CANNOT be crushed or broken. (Patient not taking: Reported on 07/07/2022) 98 tablet 0   sucralfate (CARAFATE) 1 g tablet Take 1 tablet (1 g total) by mouth 4 (four) times daily -  with meals and at bedtime. Crush and dissolve in 10 mL's of warm water, swallow 30 min prior to meals and bedtime 60 tablet 1   traZODone (DESYREL) 50 MG tablet Take 50 mg by mouth at bedtime as needed.     No current facility-administered medications for this visit.    SURGICAL HISTORY:  Past Surgical History:  Procedure Laterality Date   BRONCHIAL NEEDLE ASPIRATION BIOPSY  06/12/2022   Procedure: BRONCHIAL NEEDLE ASPIRATION BIOPSIES;  Surgeon: Chilton Greathouse, MD;  Location: [facility];  Service: Cardiopulmonary;;   COLONOSCOPY     ENDOBRONCHIAL ULTRASOUND Bilateral 06/12/2022   Procedure: ENDOBRONCHIAL ULTRASOUND;  Surgeon: Chilton Greathouse, MD;  Location: [facility];  Service: Cardiopulmonary;  Laterality: Bilateral;   HEMOSTASIS CONTROL  06/12/2022   Procedure: HEMOSTASIS CONTROL;  Surgeon: Chilton Greathouse, MD;   Location: [facility];  Service: Cardiopulmonary;;   LOOP RECORDER INSERTION N/A 03/15/2022   Procedure: LOOP RECORDER INSERTION;  Surgeon: Maurice Small, MD;  Location: [facility];  Service: Cardiovascular;  Laterality: N/A;   VIDEO BRONCHOSCOPY N/A 06/12/2022   Procedure: VIDEO BRONCHOSCOPY WITHOUT FLUORO;  Surgeon: Chilton Greathouse, MD;  Location: [facility];  Service: Cardiopulmonary;  Laterality: N/A;    REVIEW OF SYSTEMS:   Review of Systems  Constitutional: Negative for appetite change, chills, fatigue, fever and unexpected weight change.  HENT:   Negative for mouth sores, nosebleeds, sore throat and trouble swallowing.   Eyes: Negative for eye problems and icterus.  Respiratory: Negative for cough, hemoptysis, shortness of breath and wheezing.   Cardiovascular: Negative for chest pain and leg swelling.  Gastrointestinal: Negative for abdominal pain, constipation, diarrhea, nausea and vomiting.  Genitourinary:  Negative for bladder incontinence, difficulty urinating, dysuria, frequency and hematuria.   Musculoskeletal: Negative for back pain, gait problem, neck pain and neck stiffness.  Skin: Negative for itching and rash.  Neurological: Negative for dizziness, extremity weakness, gait problem, headaches, light-headedness and seizures.  Hematological: Negative for adenopathy. Does not bruise/bleed easily.  Psychiatric/Behavioral: Negative for confusion, depression and sleep disturbance. The patient is not nervous/anxious.     PHYSICAL EXAMINATION:  There were no vitals taken for this visit.  ECOG PERFORMANCE STATUS: {CHL ONC ECOG [id number]  Physical Exam  Constitutional: Oriented to person, place, and time and well-developed, well-nourished, and in no distress. No distress.  HENT:  Head: Normocephalic and atraumatic.  Mouth/Throat: Oropharynx is clear and moist. No oropharyngeal exudate.  Eyes: Conjunctivae are normal. Right eye exhibits no discharge. Left  eye exhibits no discharge. No scleral icterus.  Neck: Normal range of motion. Neck supple.  Cardiovascular: Normal rate, regular rhythm, normal heart sounds and intact distal pulses.   Pulmonary/Chest: Effort normal and breath sounds normal. No respiratory distress. No wheezes. No rales.  Abdominal: Soft. Bowel sounds are normal. Exhibits no distension and no mass. There is no tenderness.  Musculoskeletal: Normal range of motion. Exhibits no edema.  Lymphadenopathy:    No cervical adenopathy.  Neurological: Alert and oriented to person, place, and time. Exhibits normal muscle tone. Gait normal. Coordination normal.  Skin: Skin is warm and dry. No rash noted. Not diaphoretic. No erythema. No pallor.  Psychiatric: Mood, memory and judgment normal.  Vitals reviewed.  LABORATORY DATA: Lab Results  Component Value Date   WBC 8.9 12/09/2022   HGB 15.4 12/09/2022   HCT 46.9 12/09/2022   MCV 102.6 (H) 12/09/2022   PLT 239 12/09/2022      Chemistry      Component Value Date/Time   NA 139 12/09/2022 0018   NA 140 03/06/2022 1715   K 3.8 12/09/2022 0018   CL 113 (H) 12/09/2022 0018   CO2 15 (L) 12/09/2022 0018   BUN 22 12/09/2022 0018   BUN 29 (H) 03/06/2022 1715   CREATININE 1.10 12/09/2022 0018   CREATININE 1.19 08/21/2022 0850      Component Value Date/Time   CALCIUM 11.2 (H) 12/09/2022 0018   ALKPHOS 141 (H) 12/09/2022 0018   AST 13 (L) 12/09/2022 0018   AST 16 08/21/2022 0850   ALT 14 12/09/2022 0018   ALT 16 08/21/2022 0850   BILITOT 0.7 12/09/2022 0018   BILITOT 0.7 08/21/2022 0850       RADIOGRAPHIC STUDIES:  CT ABDOMEN PELVIS W CONTRAST  Result Date: 12/09/2022 CLINICAL DATA:  72 year old male with back pain after lifting. Non-small cell lung cancer. Evidence of skeletal metastases, acute and nonacute thoracic compression fractures on CT thoracic and lumbar spine earlier today. EXAM: CT ABDOMEN AND PELVIS WITH CONTRAST TECHNIQUE: Multidetector CT imaging of the  abdomen and pelvis was performed using the standard protocol following bolus administration of intravenous contrast. RADIATION DOSE REDUCTION: This exam was performed according to the departmental dose-optimization program which includes automated exposure control, adjustment of the mA and/or kV according to patient size and/or use of iterative reconstruction technique. CONTRAST:  OMNIPAQUE IOHEXOL 350 MG/ML SOLN COMPARISON:  PET-CT 04/21/2022. Lumbar spine CT 0025 hours today. CTA chest reported separately. FINDINGS: Lower chest: CTA chest reported separately. Hepatobiliary: Multiple hypodense and ill-defined liver metastases are new from the December PET-CT. The largest is in the medial left hepatic lobe, abutting the hepatic veins and up to 4.2 cm long  axis (coronal image 94). At least 4 individual metastases are identified. Gallbladder is distended but otherwise within normal limits. Pancreas: Partial pancreatic atrophy and some pancreatic ductal dilatation but no mass or inflammation. Spleen: Negative. Adrenals/Urinary Tract: 1 cm hypoenhancing area in the left adrenal gland which appears slightly enlarged since last year is suspicious for early adrenal metastasis on series 5, image 17. Right adrenal gland is stable. Multiple simple renal cysts (no follow-up imaging recommended). Nonobstructed kidneys with symmetric renal enhancement. No hydroureter. Negative bladder. Stomach/Bowel: Large bowel redundancy and diverticulosis. No active large bowel inflammation. No dilated large or small bowel. Decompressed terminal ileum. Appendix not identified, may be diminutive or absent. Decompressed stomach. Complex duodenal diverticula in the midline and at the junction with the ligament of Treitz, individually these are nearly 7 cm with some retained food/secretions. But there is no regional inflammation. Underlying duodenum and proximal small bowel remain decompressed. And there are occasional additional small  bowel diverticula. No free fluid or free air. Vascular/Lymphatic: Infrarenal abdominal aortic aneurysm, up to 3.6 cm with mural plaque or thrombus on series 5, image 38. This is stable since last year. Underlying extensive Aortoiliac calcified atherosclerosis. Major arterial structures in the abdomen and pelvis remain patent from last year. Early femoral venous contrast timing, mixing artifact. No lymphadenopathy identified. Reproductive: Negative. Other: No free fluid in the pelvis. Musculoskeletal: Thoracic spine and lumbar spine stable from dedicated CTs earlier today. Lytic new left medial iliac bone metastasis on series 5, image 43, approximately 18 mm. Similar contralateral medial right iliac bone metastasis on the same image. These are new by CT from the PET-CT last year. No other obvious pelvic bone metastasis. IMPRESSION: 1. Evidence of progressed Metastatic Lung Cancer from December PET-CT: - new liver metastases (up to 4.2 cm). - suspicion of early new left adrenal metastasis. - new iliac bone metastases. 2. No superimposed inflammatory process identified in the abdomen or pelvis. Large and small bowel diverticulosis. 3. Stable Infrarenal Abdominal Aortic Aneurysm, 3.6 cm. Recommend follow-up every 2 years. Reference: J Am Coll Radiol 2013;10:789-794. Aortic Atherosclerosis (ICD10-I70.0). aortic Atherosclerosis (ICD10-I70.0). Electronically Signed   By: Odessa Fleming M.D.   On: 12/09/2022 06:41   CT Angio Chest PE W and/or Wo Contrast  Result Date: 12/09/2022 CLINICAL DATA:  72 year old male with back pain after lifting. Non-small cell lung cancer. Evidence of skeletal metastases, acute and nonacute thoracic compression fractures on CT thoracic and lumbar spine earlier today. EXAM: CT ANGIOGRAPHY CHEST WITH CONTRAST TECHNIQUE: Multidetector CT imaging of the chest was performed using the standard protocol during bolus administration of intravenous contrast. Multiplanar CT image reconstructions and MIPs were  obtained to evaluate the vascular anatomy. RADIATION DOSE REDUCTION: This exam was performed according to the departmental dose-optimization program which includes automated exposure control, adjustment of the mA and/or kV according to patient size and/or use of iterative reconstruction technique. CONTRAST:  OMNIPAQUE IOHEXOL 350 MG/ML SOLN COMPARISON:  Thoracic spine CT 0024 hours today. Chest CT 08/25/2022 without contrast. FINDINGS: Cardiovascular: Excellent contrast bolus timing in the pulmonary arterial tree. Respiratory motion in the middle and lower lobes. Elsewhere no pulmonary artery filling defect identified. Little to no aortic contrast. Stable tortuosity of the aorta. Calcified aortic atherosclerosis. No cardiomegaly or pericardial effusion. Mediastinum/Nodes: Asymmetric right hilar soft tissue on series 2, image 60. But overall mediastinal lymphadenopathy appears regressed compared to PET-CT 04/21/2022. Lungs/Pleura: Major airways remain patent with new confluent and somewhat linear arrangement of right medial lung peribronchial and parenchymal opacity which  is probably sequelae of radiation pneumonitis. Underlying centrilobular emphysema. No pneumothorax or pleural effusion. No acute finding in the left lung. Upper Abdomen: CT Abdomen and Pelvis is reported separately. Musculoskeletal: Thoracic spine is stable from the CT at 0024 hours today. Evidence of lytic vertebral metastatic disease at T9 and T10, probably also T12. Review of the MIP images confirms the above findings. IMPRESSION: 1. Negative for acute pulmonary embolus. 2. 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 And malignant mediastinal lymphadenopathy appears regressed from last year. 3. Skeletal metastatic disease, new/progressed from last year including left 5th rib, T9 and T10 lytic metastases. Thoracic spine stable from dedicated thoracic CT earlier today. 4. CT Abdomen and Pelvis is reported separately. 5.  Aortic Atherosclerosis (ICD10-I70.0) and Emphysema (ICD10-J43.9). Electronically Signed   By: Odessa Fleming M.D.   On: 12/09/2022 06:32   CT Thoracic Spine Wo Contrast  Result Date: 12/09/2022 CLINICAL DATA:  Low back pain, lifting injury, fracture on prior MRI. Known primary lung cancer when correlating with prior studies. EXAM: CT THORACIC SPINE WITHOUT CONTRAST TECHNIQUE: Multidetector CT images of the thoracic were obtained using the standard protocol without intravenous contrast. RADIATION DOSE REDUCTION: This exam was performed according to the departmental dose-optimization program which includes automated exposure control, adjustment of the mA and/or kV according to patient size and/or use of iterative reconstruction technique. COMPARISON:  MRI lumbar spine dated 10/22/2022. CT chest dated 08/25/2022 FINDINGS: Alignment: Normal thoracic kyphosis. Vertebrae: 9 mm lytic lesion/metastasis along the left inferior aspect of the T9 vertebral body (series 8/image 91). This is new from prior CT. Mild superior endplate compression fracture deformity at T10, with 20% loss of height. Associated lytic lesions/metastasis involving the left transverse process and adjacent medial left 10th rib (series 8/image 96), measuring approximately 3.8 x 2.5 cm in aggregate. This is new from prior CT. Mild superior endplate compression fracture deformity at T12, similar to prior MRI, new from prior CT. Associated 3.1 cm lytic metastasis in the left superior vertebral body (series 8/image 124), new from prior CT although likely present (although less conspicuous) on prior MRI. Paraspinal and other soft tissues: Patchy right lung opacities (for example, series 2/image 68), new. Mild centrilobular and paraseptal emphysematous changes. Trace right pleural effusion. Atherosclerotic calcifications of the aortic arch. Disc levels: T10 and T12 compression fracture deformities, described above. No retropulsion. Mild degenerative changes of the  mid/lower thoracic spine. IMPRESSION: Mild superior endplate pathologic compression fracture deformities at T10, new from prior CT. Mild superior endplate pathologic compression fracture deformity at T12, unchanged from a recent MR, new from prior CT. Associated lytic metastases, as above. Associated lytic metastases at T9, T10, and T12, as above, new from prior CT. Patchy right lung opacities, new, poorly evaluated. Trace right pleural effusion. Consider dedicated CT chest with contrast for further evaluation. Aortic Atherosclerosis (ICD10-I70.0) and Emphysema (ICD10-J43.9). Electronically Signed   By: Charline Bills M.D.   On: 12/09/2022 01:02   CT Lumbar Spine Wo Contrast  Result Date: 12/09/2022 CLINICAL DATA:  Low back pain, lifting injury EXAM: CT LUMBAR SPINE WITHOUT CONTRAST TECHNIQUE: Multidetector CT imaging of the lumbar spine was performed without intravenous contrast administration. Multiplanar CT image reconstructions were also generated. RADIATION DOSE REDUCTION: This exam was performed according to the departmental dose-optimization program which includes automated exposure control, adjustment of the mA and/or kV according to patient size and/or use of iterative reconstruction technique. COMPARISON:  MRI lumbar spine dated 10/22/2022 FINDINGS: Segmentation: 5 lumbar type vertebral bodies. Alignment: Normal  lumbar lordosis. Vertebrae: No acute fracture or focal pathologic process. Paraspinal and other soft tissues: Left renal cysts incompletely visualized and poorly evaluated. 3 mm nonobstructing left upper pole renal calculus. Atherosclerotic calcifications of the abdominal aorta and branch vessels. Disc levels: Mild to moderate degenerative changes, most prominent at L1-2. Spinal canal is patent. IMPRESSION: No traumatic injury to the lumbar spine. Mild to moderate degenerative changes, as above. Electronically Signed   By: Charline Bills M.D.   On: 12/09/2022 00:46     ASSESSMENT/PLAN:   This is a very pleasant 72 year old African-American male with metastatic lung cancer, initially diagnosed as stage IIIb (T0 N2, M0), small cell lung cancer, adenocarcinoma.  The patient presented with right hilar mediastinal lymphadenopathy with no clear primary.  He was diagnosed in February 2024.  He was found to have metastatic disease to the liver and August 2024.  His molecular studies show he is positive for K-ras G12 C mutation and has a PD-L1 expression of 53%.  He underwent a course of concurrent chemoradiation with weekly carboplatin for an AUC of 2 and paclitaxel 45 mg/m.  He is status post 5 cycles of treatment.  His last treatment was on 08/21/2022.  He was lost to follow-up since this time.  More recently he presented to the emergency room with back pain and pathologic fractures and imaging study showed concerns for metastatic disease to the liver.  The patient is here today to reestablish care.  The patient was seen with Dr. Arbutus Ped today.  Dr. Arbutus Ped had lengthy discussion with the patient today about his current condition and necessary workup.  Dr. Arbutus Ped recommends a biopsy of the liver to confirm that this is metastatic adenocarcinoma from his lung cancer.  I have placed an order.  If so then Dr. Arbutus Ped would recommend systemic treatment with either combination chemotherapy and immunotherapy with carboplatin for an AUC 5, Alimta 500 mg/m, and Keytruda 200 mg IV every 3 weeks versus single agent immunotherapy IV every 3 weeks.  The patient is interested in* *he is expected to undergo his first cycle of treatment on ***  Sent prescriptions for Compazine 10 mg every 6 hours as needed for nausea and vomiting to the patient's pharmacy.  I also have sent a prescription for folic acid.  We will arrange for him to receive a B12 injection in the clinic today.  We will see him back for [facility] follow-up visit in 2 weeks for evaluation and a 1 week follow-up visit to manage any adverse  side effects of treatment.  Back pain?  Who prescribes back medicine?  Constipation?  The patient was advised to call immediately if he has any concerning symptoms in the interval. The patient voices understanding of current disease status and treatment options and is in agreement with the current care plan. All questions were answered. The patient knows to call the clinic with any problems, questions or concerns. We can certainly see the patient much sooner if necessary      No orders of the defined types were placed in this encounter.    I spent {CHL ONC TIME VISIT - ZOXWR:[id number]} counseling the patient face to face. The total time spent in the appointment was {CHL ONC TIME VISIT - BJYNW:[id number]}.  Brenlynn Fake L Shunsuke Granzow, PA-C 12/17/22

## 2022-12-18 ENCOUNTER — Ambulatory Visit (INDEPENDENT_AMBULATORY_CARE_PROVIDER_SITE_OTHER): Payer: Medicare HMO

## 2022-12-18 DIAGNOSIS — I6300 Cerebral infarction due to thrombosis of unspecified precerebral artery: Secondary | ICD-10-CM | POA: Diagnosis not present

## 2022-12-18 LAB — CUP PACEART REMOTE DEVICE CHECK
Date Time Interrogation Session: 20240816230452
Implantable Pulse Generator Implant Date: 20231123

## 2022-12-20 ENCOUNTER — Inpatient Hospital Stay: Payer: Medicare HMO | Admitting: Physician Assistant

## 2022-12-20 ENCOUNTER — Telehealth: Payer: Self-pay

## 2022-12-20 ENCOUNTER — Inpatient Hospital Stay: Payer: Medicare HMO

## 2022-12-20 ENCOUNTER — Telehealth: Payer: Self-pay | Admitting: Internal Medicine

## 2022-12-20 NOTE — Telephone Encounter (Signed)
TC to pt d/t not showing up for his 1100 appt w/ C. Heilingoettter, PA today. No answer, VM message stating that VM has not been set up. TC to his daughter; went to VM--left message to call office.

## 2022-12-20 NOTE — Telephone Encounter (Signed)
See previous TC note !

## 2022-12-27 ENCOUNTER — Other Ambulatory Visit: Payer: Self-pay

## 2022-12-27 ENCOUNTER — Encounter (HOSPITAL_COMMUNITY): Payer: Self-pay | Admitting: Internal Medicine

## 2022-12-27 ENCOUNTER — Inpatient Hospital Stay (HOSPITAL_COMMUNITY)
Admission: EM | Admit: 2022-12-27 | Discharge: 2023-01-08 | DRG: 175 | Disposition: A | Discharge status: Home Health | Payer: Medicare HMO | Attending: Internal Medicine | Admitting: Internal Medicine

## 2022-12-27 ENCOUNTER — Inpatient Hospital Stay: Payer: Medicare HMO

## 2022-12-27 ENCOUNTER — Emergency Department (HOSPITAL_COMMUNITY): Payer: Medicare HMO

## 2022-12-27 ENCOUNTER — Inpatient Hospital Stay (HOSPITAL_BASED_OUTPATIENT_CLINIC_OR_DEPARTMENT_OTHER): Payer: Medicare HMO | Admitting: Physician Assistant

## 2022-12-27 VITALS — BP 84/68 | HR 165 | Temp 98.4°F | Resp 20

## 2022-12-27 DIAGNOSIS — C787 Secondary malignant neoplasm of liver and intrahepatic bile duct: Secondary | ICD-10-CM | POA: Diagnosis present

## 2022-12-27 DIAGNOSIS — E872 Acidosis, unspecified: Secondary | ICD-10-CM | POA: Diagnosis present

## 2022-12-27 DIAGNOSIS — C7951 Secondary malignant neoplasm of bone: Secondary | ICD-10-CM

## 2022-12-27 DIAGNOSIS — M5136 Other intervertebral disc degeneration, lumbar region: Secondary | ICD-10-CM | POA: Diagnosis present

## 2022-12-27 DIAGNOSIS — Z9221 Personal history of antineoplastic chemotherapy: Secondary | ICD-10-CM | POA: Insufficient documentation

## 2022-12-27 DIAGNOSIS — Z923 Personal history of irradiation: Secondary | ICD-10-CM | POA: Diagnosis not present

## 2022-12-27 DIAGNOSIS — Z66 Do not resuscitate: Secondary | ICD-10-CM | POA: Diagnosis not present

## 2022-12-27 DIAGNOSIS — G893 Neoplasm related pain (acute) (chronic): Secondary | ICD-10-CM

## 2022-12-27 DIAGNOSIS — I2693 Single subsegmental pulmonary embolism without acute cor pulmonale: Secondary | ICD-10-CM

## 2022-12-27 DIAGNOSIS — Z87891 Personal history of nicotine dependence: Secondary | ICD-10-CM

## 2022-12-27 DIAGNOSIS — R0989 Other specified symptoms and signs involving the circulatory and respiratory systems: Secondary | ICD-10-CM | POA: Diagnosis not present

## 2022-12-27 DIAGNOSIS — Z7984 Long term (current) use of oral hypoglycemic drugs: Secondary | ICD-10-CM | POA: Diagnosis not present

## 2022-12-27 DIAGNOSIS — E1169 Type 2 diabetes mellitus with other specified complication: Secondary | ICD-10-CM | POA: Diagnosis present

## 2022-12-27 DIAGNOSIS — C3491 Malignant neoplasm of unspecified part of right bronchus or lung: Secondary | ICD-10-CM | POA: Insufficient documentation

## 2022-12-27 DIAGNOSIS — M549 Dorsalgia, unspecified: Secondary | ICD-10-CM | POA: Insufficient documentation

## 2022-12-27 DIAGNOSIS — I69398 Other sequelae of cerebral infarction: Secondary | ICD-10-CM

## 2022-12-27 DIAGNOSIS — I959 Hypotension, unspecified: Secondary | ICD-10-CM | POA: Diagnosis present

## 2022-12-27 DIAGNOSIS — R630 Anorexia: Secondary | ICD-10-CM | POA: Diagnosis present

## 2022-12-27 DIAGNOSIS — I10 Essential (primary) hypertension: Secondary | ICD-10-CM | POA: Diagnosis present

## 2022-12-27 DIAGNOSIS — R0902 Hypoxemia: Secondary | ICD-10-CM | POA: Diagnosis present

## 2022-12-27 DIAGNOSIS — J700 Acute pulmonary manifestations due to radiation: Secondary | ICD-10-CM | POA: Diagnosis not present

## 2022-12-27 DIAGNOSIS — R000 Tachycardia, unspecified: Secondary | ICD-10-CM

## 2022-12-27 DIAGNOSIS — I471 Supraventricular tachycardia, unspecified: Secondary | ICD-10-CM | POA: Insufficient documentation

## 2022-12-27 DIAGNOSIS — G8929 Other chronic pain: Secondary | ICD-10-CM | POA: Diagnosis present

## 2022-12-27 DIAGNOSIS — Z8673 Personal history of transient ischemic attack (TIA), and cerebral infarction without residual deficits: Secondary | ICD-10-CM

## 2022-12-27 DIAGNOSIS — R197 Diarrhea, unspecified: Secondary | ICD-10-CM | POA: Diagnosis present

## 2022-12-27 DIAGNOSIS — E785 Hyperlipidemia, unspecified: Secondary | ICD-10-CM | POA: Diagnosis present

## 2022-12-27 DIAGNOSIS — Y842 Radiological procedure and radiotherapy as the cause of abnormal reaction of the patient, or of later complication, without mention of misadventure at the time of the procedure: Secondary | ICD-10-CM | POA: Diagnosis present

## 2022-12-27 DIAGNOSIS — M4854XA Collapsed vertebra, not elsewhere classified, thoracic region, initial encounter for fracture: Secondary | ICD-10-CM | POA: Diagnosis present

## 2022-12-27 DIAGNOSIS — E538 Deficiency of other specified B group vitamins: Secondary | ICD-10-CM | POA: Diagnosis present

## 2022-12-27 DIAGNOSIS — R0682 Tachypnea, not elsewhere classified: Secondary | ICD-10-CM | POA: Diagnosis not present

## 2022-12-27 DIAGNOSIS — R64 Cachexia: Secondary | ICD-10-CM | POA: Diagnosis present

## 2022-12-27 DIAGNOSIS — Z79899 Other long term (current) drug therapy: Secondary | ICD-10-CM

## 2022-12-27 DIAGNOSIS — Z7902 Long term (current) use of antithrombotics/antiplatelets: Secondary | ICD-10-CM

## 2022-12-27 DIAGNOSIS — I7121 Aneurysm of the ascending aorta, without rupture: Secondary | ICD-10-CM | POA: Diagnosis present

## 2022-12-27 DIAGNOSIS — I2699 Other pulmonary embolism without acute cor pulmonale: Secondary | ICD-10-CM | POA: Diagnosis not present

## 2022-12-27 DIAGNOSIS — C3490 Malignant neoplasm of unspecified part of unspecified bronchus or lung: Secondary | ICD-10-CM

## 2022-12-27 DIAGNOSIS — C7972 Secondary malignant neoplasm of left adrenal gland: Secondary | ICD-10-CM | POA: Diagnosis present

## 2022-12-27 DIAGNOSIS — Z515 Encounter for palliative care: Secondary | ICD-10-CM | POA: Diagnosis not present

## 2022-12-27 DIAGNOSIS — E43 Unspecified severe protein-calorie malnutrition: Secondary | ICD-10-CM | POA: Diagnosis not present

## 2022-12-27 DIAGNOSIS — M545 Low back pain, unspecified: Secondary | ICD-10-CM | POA: Diagnosis present

## 2022-12-27 DIAGNOSIS — N179 Acute kidney failure, unspecified: Secondary | ICD-10-CM

## 2022-12-27 DIAGNOSIS — J984 Other disorders of lung: Secondary | ICD-10-CM | POA: Diagnosis not present

## 2022-12-27 DIAGNOSIS — R54 Age-related physical debility: Secondary | ICD-10-CM | POA: Diagnosis present

## 2022-12-27 DIAGNOSIS — Z6823 Body mass index (BMI) 23.0-23.9, adult: Secondary | ICD-10-CM

## 2022-12-27 DIAGNOSIS — H538 Other visual disturbances: Secondary | ICD-10-CM | POA: Diagnosis present

## 2022-12-27 DIAGNOSIS — C801 Malignant (primary) neoplasm, unspecified: Secondary | ICD-10-CM | POA: Diagnosis not present

## 2022-12-27 DIAGNOSIS — Z7189 Other specified counseling: Secondary | ICD-10-CM | POA: Diagnosis not present

## 2022-12-27 DIAGNOSIS — C781 Secondary malignant neoplasm of mediastinum: Secondary | ICD-10-CM | POA: Diagnosis not present

## 2022-12-27 DIAGNOSIS — R918 Other nonspecific abnormal finding of lung field: Secondary | ICD-10-CM | POA: Diagnosis not present

## 2022-12-27 DIAGNOSIS — Z801 Family history of malignant neoplasm of trachea, bronchus and lung: Secondary | ICD-10-CM

## 2022-12-27 LAB — CBC WITH DIFFERENTIAL/PLATELET
Abs Immature Granulocytes: 0.1 10*3/uL — ABNORMAL HIGH (ref 0.00–0.07)
Basophils Absolute: 0.1 10*3/uL (ref 0.0–0.1)
Basophils Relative: 1 %
Eosinophils Absolute: 0.2 10*3/uL (ref 0.0–0.5)
Eosinophils Relative: 2 %
HCT: 48.7 % (ref 39.0–52.0)
Hemoglobin: 15.8 g/dL (ref 13.0–17.0)
Immature Granulocytes: 1 %
Lymphocytes Relative: 8 %
Lymphs Abs: 1 10*3/uL (ref 0.7–4.0)
MCH: 33.1 pg (ref 26.0–34.0)
MCHC: 32.4 g/dL (ref 30.0–36.0)
MCV: 102.1 fL — ABNORMAL HIGH (ref 80.0–100.0)
Monocytes Absolute: 1.1 10*3/uL — ABNORMAL HIGH (ref 0.1–1.0)
Monocytes Relative: 9 %
Neutro Abs: 9.7 10*3/uL — ABNORMAL HIGH (ref 1.7–7.7)
Neutrophils Relative %: 79 %
Platelets: 171 10*3/uL (ref 150–400)
RBC: 4.77 MIL/uL (ref 4.22–5.81)
RDW: 14.8 % (ref 11.5–15.5)
WBC: 12.1 10*3/uL — ABNORMAL HIGH (ref 4.0–10.5)
nRBC: 0 % (ref 0.0–0.2)

## 2022-12-27 LAB — CBC WITH DIFFERENTIAL (CANCER CENTER ONLY)
Abs Immature Granulocytes: 0.12 10*3/uL — ABNORMAL HIGH (ref 0.00–0.07)
Basophils Absolute: 0.1 10*3/uL (ref 0.0–0.1)
Basophils Relative: 1 %
Eosinophils Absolute: 0.2 10*3/uL (ref 0.0–0.5)
Eosinophils Relative: 2 %
HCT: 45.6 % (ref 39.0–52.0)
Hemoglobin: 15.8 g/dL (ref 13.0–17.0)
Immature Granulocytes: 1 %
Lymphocytes Relative: 9 %
Lymphs Abs: 1.1 10*3/uL (ref 0.7–4.0)
MCH: 34 pg (ref 26.0–34.0)
MCHC: 34.6 g/dL (ref 30.0–36.0)
MCV: 98.1 fL (ref 80.0–100.0)
Monocytes Absolute: 1 10*3/uL (ref 0.1–1.0)
Monocytes Relative: 9 %
Neutro Abs: 9.1 10*3/uL — ABNORMAL HIGH (ref 1.7–7.7)
Neutrophils Relative %: 78 %
Platelet Count: 148 10*3/uL — ABNORMAL LOW (ref 150–400)
RBC: 4.65 MIL/uL (ref 4.22–5.81)
RDW: 14.7 % (ref 11.5–15.5)
WBC Count: 11.5 10*3/uL — ABNORMAL HIGH (ref 4.0–10.5)
nRBC: 0 % (ref 0.0–0.2)

## 2022-12-27 LAB — COMPREHENSIVE METABOLIC PANEL
ALT: 30 U/L (ref 0–44)
AST: 30 U/L (ref 15–41)
Albumin: 3.3 g/dL — ABNORMAL LOW (ref 3.5–5.0)
Alkaline Phosphatase: 149 U/L — ABNORMAL HIGH (ref 38–126)
Anion gap: 19 — ABNORMAL HIGH (ref 5–15)
BUN: 30 mg/dL — ABNORMAL HIGH (ref 8–23)
CO2: 16 mmol/L — ABNORMAL LOW (ref 22–32)
Calcium: 12.5 mg/dL — ABNORMAL HIGH (ref 8.9–10.3)
Chloride: 105 mmol/L (ref 98–111)
Creatinine, Ser: 1.69 mg/dL — ABNORMAL HIGH (ref 0.61–1.24)
GFR, Estimated: 43 mL/min — ABNORMAL LOW (ref >60)
Glucose, Bld: 185 mg/dL — ABNORMAL HIGH (ref 70–99)
Potassium: 3.9 mmol/L (ref 3.5–5.1)
Sodium: 140 mmol/L (ref 135–145)
Total Bilirubin: 1.1 mg/dL (ref 0.3–1.2)
Total Protein: 7.6 g/dL (ref 6.5–8.1)

## 2022-12-27 LAB — MAGNESIUM: Magnesium: 1.4 mg/dL — ABNORMAL LOW (ref 1.7–2.4)

## 2022-12-27 LAB — PROTIME-INR
INR: 1.2 (ref 0.8–1.2)
Prothrombin Time: 15.2 seconds (ref 11.4–15.2)

## 2022-12-27 LAB — TROPONIN I (HIGH SENSITIVITY)
Troponin I (High Sensitivity): 13 ng/L (ref <18)
Troponin I (High Sensitivity): 17 ng/L (ref <18)

## 2022-12-27 LAB — APTT: aPTT: 29 seconds (ref 24–36)

## 2022-12-27 LAB — TSH: TSH: 3.516 u[IU]/mL (ref 0.350–4.500)

## 2022-12-27 MED ORDER — ACETAMINOPHEN 325 MG PO TABS
650.0000 mg | ORAL_TABLET | Freq: Once | ORAL | Status: AC
  Administered 2022-12-27: 650 mg via ORAL
  Filled 2022-12-27: qty 2

## 2022-12-27 MED ORDER — LACTATED RINGERS IV BOLUS
500.0000 mL | Freq: Once | INTRAVENOUS | Status: AC
  Administered 2022-12-27: 500 mL via INTRAVENOUS

## 2022-12-27 MED ORDER — LACTATED RINGERS IV BOLUS
1000.0000 mL | Freq: Once | INTRAVENOUS | Status: AC
  Administered 2022-12-27: 1000 mL via INTRAVENOUS

## 2022-12-27 MED ORDER — MAGNESIUM SULFATE IN D5W 1-5 GM/100ML-% IV SOLN
1.0000 g | Freq: Once | INTRAVENOUS | Status: AC
  Administered 2022-12-27: 1 g via INTRAVENOUS
  Filled 2022-12-27: qty 100

## 2022-12-27 MED ORDER — POLYETHYLENE GLYCOL 3350 17 G PO PACK
17.0000 g | PACK | Freq: Every day | ORAL | Status: DC | PRN
  Administered 2022-12-31 – 2023-01-01 (×4): 17 g via ORAL
  Filled 2022-12-27 (×5): qty 1

## 2022-12-27 MED ORDER — SODIUM CHLORIDE 0.9% FLUSH
3.0000 mL | Freq: Two times a day (BID) | INTRAVENOUS | Status: DC
  Administered 2022-12-29 – 2023-01-08 (×12): 3 mL via INTRAVENOUS

## 2022-12-27 MED ORDER — HEPARIN (PORCINE) 25000 UT/250ML-% IV SOLN
1400.0000 [IU]/h | INTRAVENOUS | Status: DC
  Administered 2022-12-27: 1350 [IU]/h via INTRAVENOUS
  Administered 2022-12-29 – 2022-12-30 (×2): 1150 [IU]/h via INTRAVENOUS
  Filled 2022-12-27 (×4): qty 250

## 2022-12-27 MED ORDER — HYDROMORPHONE HCL 1 MG/ML IJ SOLN
0.5000 mg | INTRAMUSCULAR | Status: DC | PRN
  Administered 2022-12-29 – 2023-01-03 (×6): 0.5 mg via INTRAVENOUS
  Filled 2022-12-27 (×6): qty 0.5

## 2022-12-27 MED ORDER — IOHEXOL 350 MG/ML SOLN
75.0000 mL | Freq: Once | INTRAVENOUS | Status: AC | PRN
  Administered 2022-12-27: 75 mL via INTRAVENOUS

## 2022-12-27 MED ORDER — ACETAMINOPHEN 325 MG PO TABS
650.0000 mg | ORAL_TABLET | Freq: Four times a day (QID) | ORAL | Status: DC | PRN
  Administered 2022-12-30: 650 mg via ORAL
  Filled 2022-12-27: qty 2

## 2022-12-27 MED ORDER — ACETAMINOPHEN 650 MG RE SUPP: 650.0000 mg | Freq: Four times a day (QID) | RECTAL | Status: DC | PRN

## 2022-12-27 MED ORDER — SODIUM CHLORIDE (PF) 0.9 % IJ SOLN
INTRAMUSCULAR | Status: AC
  Filled 2022-12-27: qty 50

## 2022-12-27 MED ORDER — HEPARIN BOLUS VIA INFUSION
4000.0000 [IU] | Freq: Once | INTRAVENOUS | Status: AC
  Administered 2022-12-27: 4000 [IU] via INTRAVENOUS
  Filled 2022-12-27: qty 4000

## 2022-12-27 MED ORDER — OXYCODONE HCL 5 MG PO TABS
5.0000 mg | ORAL_TABLET | ORAL | Status: DC | PRN
  Administered 2022-12-27 – 2023-01-03 (×19): 5 mg via ORAL
  Filled 2022-12-27 (×20): qty 1

## 2022-12-27 MED ORDER — SODIUM CHLORIDE 0.9 % IV BOLUS
1000.0000 mL | Freq: Once | INTRAVENOUS | Status: AC
  Administered 2022-12-27: 1000 mL via INTRAVENOUS

## 2022-12-27 MED ORDER — RINGERS IV SOLN: INTRAVENOUS | Status: DC

## 2022-12-27 MED ORDER — LIDOCAINE 5 % EX PTCH
1.0000 | MEDICATED_PATCH | Freq: Once | CUTANEOUS | Status: AC
  Administered 2022-12-27: 1 via TRANSDERMAL
  Filled 2022-12-27: qty 1

## 2022-12-27 NOTE — Assessment & Plan Note (Deleted)
X 3 weeks per patient C diff ordered Patient reports this is related to Ensure Hold Ensure and follow No longer on precautions

## 2022-12-27 NOTE — Progress Notes (Signed)
[facility] ED rm 8 [facility] Liaison note: This patient is currently enrolled in [facility] outpatient-based palliative care. Hospital Liaison will continue to follow for discharge disposition. Please call for any outpatient based palliative care related questions or concerns. Thank you, Thea Gist, BSN [facility] liaison [phone]

## 2022-12-27 NOTE — ED Provider Notes (Signed)
[place] EMERGENCY DEPARTMENT AT [facility] Provider Note   CSN: [id number] Arrival date & time: 12/27/22  1119     History  Chief Complaint  Patient presents with   Tachycardia    Gavin Dennis is a 72 y.o. male.  43-year-old male presenting emergency department from cancer center with concern for elevated heart rate and low blood pressure.  Patient reports that he has been feeling in his normal state of health.  His only complaint currently is states chronic low back pain.  Denies bowel or bladder discomfort, saddle anesthesia, weakness in lower extremities.  Reportedly heart rate was in the 160s at the cancer center.  He has no history of heart problems.  Denying any chest pain, shortness of breath, no lightheadedness.        Home Medications Prior to Admission medications   Medication Sig Start Date End Date Taking? Authorizing Provider  amLODipine (NORVASC) 10 MG tablet Take 1 tablet (10 mg total) by mouth daily. 11/15/20 05/16/26  Grayce Sessions, NP  atorvastatin (LIPITOR) 20 MG tablet Take 20 mg by mouth in the morning. 04/06/22   [provider]  BREZTRI AEROSPHERE 160-9-4.8 MCG/ACT AERO Inhale 2 puffs into the lungs daily. 02/27/22   [provider]  clopidogrel (PLAVIX) 75 MG tablet Take 1 tablet (75 mg total) by mouth daily. 03/16/22   Osvaldo Shipper, MD  Cyanocobalamin (VITAMIN B-12 PO) Take 1 tablet by mouth daily.    [provider]  furosemide (LASIX) 40 MG tablet Take 40 mg by mouth daily. 08/22/22   [provider]  lidocaine (XYLOCAINE) 2 % solution Use as directed 15 mLs in the mouth or throat as needed for mouth pain. Swallow 20 mi prior to meals 08/22/22   Antony Blackbird, MD  metFORMIN (GLUCOPHAGE) 500 MG tablet Take 500 mg by mouth in the morning.    [provider]  metoprolol succinate (TOPROL-XL) 25 MG 24 hr tablet Take 25 mg by mouth daily. 06/20/22   [provider]  Omega-3 Fatty Acids  (OMEGA-3 PO) Take 1 capsule by mouth in the morning. OmegaXL Joint Pain Relief & Inflammation Supplement    [provider]  omeprazole (PRILOSEC) 40 MG capsule Take 40 mg by mouth daily as needed (indigestion.). 05/25/22   [provider]  ondansetron (ZOFRAN) 4 MG tablet Take 1 tablet (4 mg total) by mouth daily as needed for nausea or vomiting. 12/09/22 12/09/23  Carollee Herter, DO  potassium chloride (KLOR-CON) 10 MEQ tablet Take 10 mEq by mouth daily. 06/13/22   [provider]  prochlorperazine (COMPAZINE) 10 MG tablet Take 1 tablet (10 mg total) by mouth every 6 (six) hours as needed for nausea or vomiting. 06/26/22   Si Gaul, MD  Study - OCEANIC-STROKE - asundexian 50 mg or placebo tablet (PI-Sethi) Take 1 tablet (50 mg total) by mouth daily. For Investigational Use Only. Take at the same time each day (preferably in the morning). Tablet should be swallowed whole with water; it CANNOT be crushed or broken. 03/16/22   Osvaldo Shipper, MD  sucralfate (CARAFATE) 1 g tablet Take 1 tablet (1 g total) by mouth 4 (four) times daily -  with meals and at bedtime. Crush and dissolve in 10 mL's of warm water, swallow 30 min prior to meals and bedtime 08/08/22   Antony Blackbird, MD  traZODone (DESYREL) 50 MG tablet Take 50 mg by mouth at bedtime as needed. 10/18/22   [provider]  Allergies    Patient has no known allergies.    Review of Systems   Review of Systems  Physical Exam Updated Vital Signs BP (!) 139/118   Pulse (!) 115   Temp (!) 97.5 F (36.4 C) (Oral)   Resp (!) 22   Ht 5\' 11"  (1.803 m)   Wt 77 kg   SpO2 92%   BMI 23.68 kg/m  Physical Exam Vitals and nursing note reviewed.  Constitutional:      General: He is not in acute distress.    Appearance: He is not toxic-appearing.  HENT:     Head: Normocephalic.     Nose: Nose normal.     Mouth/Throat:     Mouth: Mucous membranes are moist.  Eyes:     Conjunctiva/sclera: Conjunctivae  normal.  Cardiovascular:     Rate and Rhythm: Regular rhythm. Tachycardia present.     Pulses: Normal pulses.  Pulmonary:     Comments: Some accessory muscle use, but not in overt respiratory distress.  Maintaining oxygen saturation on room air. Abdominal:     General: Abdomen is flat. There is no distension.     Tenderness: There is no abdominal tenderness. There is no guarding or rebound.  Musculoskeletal:     Comments: Normal sensation lower extremities.  5 out of 5 plantarflexion 5 out of 5 dorsi flexion.  Neurological:     Mental Status: He is alert and oriented to person, place, and time.  Psychiatric:        Mood and Affect: Mood normal.        Behavior: Behavior normal.     ED Results / Procedures / Treatments   Labs (all labs ordered are listed, but only abnormal results are displayed) Labs Reviewed  CBC WITH DIFFERENTIAL/PLATELET - Abnormal; Notable for the following components:      Result Value   WBC 12.1 (*)    MCV 102.1 (*)    Neutro Abs 9.7 (*)    Monocytes Absolute 1.1 (*)    Abs Immature Granulocytes 0.10 (*)    All other components within normal limits  COMPREHENSIVE METABOLIC PANEL - Abnormal; Notable for the following components:   CO2 16 (*)    Glucose, Bld 185 (*)    BUN 30 (*)    Creatinine, Ser 1.69 (*)    Calcium 12.5 (*)    Albumin 3.3 (*)    Alkaline Phosphatase 149 (*)    GFR, Estimated 43 (*)    Anion gap 19 (*)    All other components within normal limits  MAGNESIUM - Abnormal; Notable for the following components:   Magnesium 1.4 (*)    All other components within normal limits  TSH  T4, FREE  APTT  PROTIME-INR  HEPARIN LEVEL (UNFRACTIONATED)  TROPONIN I (HIGH SENSITIVITY)  TROPONIN I (HIGH SENSITIVITY)    EKG EKG Interpretation Date/Time:  Wednesday December 27 2022 14:42:12 EDT Ventricular Rate:  111 PR Interval:  181 QRS Duration:  99 QT Interval:  341 QTC Calculation: 464 R Axis:   21  Text Interpretation: Sinus  tachycardia Probable left atrial enlargement Low voltage, precordial leads Confirmed by Estanislado Pandy [phone]) on 12/27/2022 4:20:20 PM  Radiology CT Angio Chest PE W and/or Wo Contrast  Result Date: 12/27/2022 CLINICAL DATA:  Tachypnea, tachycardia, generalized weakness, history of non-small cell lung cancer EXAM: CT ANGIOGRAPHY CHEST WITH CONTRAST TECHNIQUE: Multidetector CT imaging of the chest was performed using the standard protocol during bolus administration of intravenous contrast. Multiplanar  CT image reconstructions and MIPs were obtained to evaluate the vascular anatomy. RADIATION DOSE REDUCTION: This exam was performed according to the departmental dose-optimization program which includes automated exposure control, adjustment of the mA and/or kV according to patient size and/or use of iterative reconstruction technique. CONTRAST:  75mL OMNIPAQUE IOHEXOL 350 MG/ML SOLN COMPARISON:  12/09/2022, 12/27/2022 FINDINGS: Cardiovascular: This is a technically adequate evaluation of the pulmonary vasculature. There is a small nonocclusive left upper lobe segmental pulmonary embolus, reference image 57/4, new since prior study. Clot burden is minimal. No other filling defects. There is extrinsic narrowing of the right main pulmonary artery and right middle lobe pulmonary artery due to increasing soft tissue density at the right hilum, worrisome for progression of disease. The heart is unremarkable without pericardial effusion. 4.5 cm ascending thoracic aortic aneurysm is identified. Evaluation of the aortic lumen is limited due to timing of contrast bolus. Stable atherosclerosis of the aorta and coronary vasculature. Mediastinum/Nodes: Thyroid, trachea, and esophagus are unremarkable. Subcarinal adenopathy measures up to 15 mm in short axis, unchanged since prior exam. Increased soft tissue density at the right hilum measures 1.8 x 3.6 cm reference image 72/4, previously having measured 2.0 x 1.7 cm. This  results and increasing extrinsic compression upon the right main pulmonary artery and right middle lobe pulmonary artery. Lungs/Pleura: Upper lobe predominant emphysema again noted. Right perihilar lung consolidation is again noted, slightly more pronounced in the right middle and right lower lobe since prior study, likely sequela of radiation pneumonitis. Trace right pleural effusion is noted. There is some minimal tree in bud nodular airspace disease within the left upper lobe, unchanged since prior exam, likely sequela of inflammation or infection. No pneumothorax. Central airways are patent. Upper Abdomen: Metastatic lesions within the liver are again identified, measuring up to 4.2 cm in the left lobe image 121/4 and 2.5 cm in the right lobe image 168/4. These lesions are more difficult to appreciate due to timing of contrast bolus. Stable left adrenal mass consistent with metastatic disease. 3.6 cm infrarenal abdominal aortic aneurysm again noted. Musculoskeletal: Large lytic lesions are seen within the left scapula, left third rib, right lateral fifth rib, inferior margin of the right scapula, T10, and T12, consistent with metastatic disease. There are other smaller lucency seen throughout the thoracic cage consistent with additional metastatic foci, also stable. Review of the MIP images confirms the above findings. IMPRESSION: 1. Small left upper lobe segmental pulmonary embolus, new since prior study. Minimal clot burden without right heart strain. 2. Progressive right hilar mass, with extrinsic compression of the right pulmonary artery, consistent with worsening malignancy. 3. Progressive consolidation within the right perihilar region, greatest in the right lower lobe, compatible with radiation pneumonitis. 4. Continued findings of metastatic disease, with diffuse bony metastases, mediastinal adenopathy, multiple liver lesions, and left adrenal mass as above. These are not appreciably changed since prior  staging CT performed 12/09/2022. 5. 4.6 cm ascending thoracic aortic aneurysm. Ascending thoracic aortic aneurysm. Recommend semi-annual imaging followup by CTA or MRA and referral to cardiothoracic surgery if not already obtained. This recommendation follows 2010 ACCF/AHA/AATS/ACR/ASA/SCA/SCAI/SIR/STS/SVM Guidelines for the Diagnosis and Management of Patients With Thoracic Aortic Disease. Circulation. 2010; 121: Z610-R604. Aortic aneurysm NOS (ICD10-I71.9) 6. Abdominal aortic aneurysm measuring 3.6 cm infrarenal. Recommend follow-up every 2 years. Reference: J Am Coll Radiol 2013;10:789-794. Critical Value/emergent results were called by telephone at the time of interpretation on 12/27/2022 at 4:10 pm to provider [facility] , who verbally acknowledged these results. Electronically Signed  By: Sharlet Salina M.D.   On: 12/27/2022 16:10   DG Chest Portable 1 View  Result Date: 12/27/2022 CLINICAL DATA:  Tachypnea, tachycardia, and generalized weakness EXAM: PORTABLE CHEST 1 VIEW COMPARISON:  CTA chest dated 12/09/2022, chest radiograph dated 06/12/2022 FINDINGS: Asymmetric low right lung volumes with irregular right hilar opacity, likely reflecting radiation changes. No pleural effusion or pneumothorax. The heart size and mediastinal contours are within normal limits. No acute osseous abnormality. IMPRESSION: Asymmetric low right lung volumes with irregular right hilar opacity, likely reflecting radiation changes. Electronically Signed   By: Agustin Cree M.D.   On: 12/27/2022 11:50    Procedures .Critical Care  Performed by: Coral Spikes, DO Authorized by: Coral Spikes, DO   Critical care provider statement:    Critical care time (minutes):  30   Critical care time was exclusive of:  Separately billable procedures and treating other patients   Critical care was necessary to treat or prevent imminent or life-threatening deterioration of the following conditions:  Cardiac failure and circulatory  failure   Critical care was time spent personally by me on the following activities:  Discussions with consultants, evaluation of patient's response to treatment, examination of patient, interpretation of cardiac output measurements, obtaining history from patient or surrogate, ordering and performing treatments and interventions, ordering and review of laboratory studies, ordering and review of radiographic studies, pulse oximetry, re-evaluation of patient's condition and review of old charts   I assumed direction of critical care for this patient from another provider in my specialty: yes     Care discussed with: admitting provider       Medications Ordered in ED Medications  lidocaine (LIDODERM) 5 % 1 patch (1 patch Transdermal Patch Applied 12/27/22 1455)  heparin bolus via infusion 4,000 Units (has no administration in time range)    Followed by  heparin ADULT infusion 100 units/mL (25000 units/248mL) (has no administration in time range)  sodium chloride 0.9 % bolus 1,000 mL (0 mLs Intravenous Stopped 12/27/22 1250)  magnesium sulfate IVPB 1 g 100 mL (0 g Intravenous Stopped 12/27/22 1400)  lactated ringers bolus 1,000 mL (0 mLs Intravenous Stopped 12/27/22 1400)  acetaminophen (TYLENOL) tablet 650 mg (650 mg Oral Given 12/27/22 1455)  iohexol (OMNIPAQUE) 350 MG/ML injection 75 mL (75 mLs Intravenous Contrast Given 12/27/22 1526)  lactated ringers bolus 500 mL (500 mLs Intravenous New Bag/Given 12/27/22 1623)    ED Course/ Medical Decision Making/ A&P Clinical Course as of 12/27/22 1642  Wed Dec 27, 2022  1134 Brought to room by nurse to evaluate patient.  From cancer center, presenting with tachycardia.  Patient reportedly at heart rate in the 160s at the cancer center and sent to the ED.  Patient essentially asymptomatic denying lightheadedness, chest pain, shortness of breath.  Heart rate in the 140s.  Appears to be possible flutter on ECG.  Blood pressure soft, will give fluid bolus.  [TY]   1221 DG Chest Portable 1 View Asymmetric low right lung volumes with irregular right hilar opacity, likely reflecting radiation changes.   [TY]  1221 CBC with Differential(!) Mild luekocytosis [TY]  1227 Magnesium(!): 1.4 Will give 1 g, IV mag [TY]  1228 Comprehensive metabolic panel(!) AKI; giving IVFs [TY]  1236 BP 125/89 HR in 120s [TY]  1608 LUL small segmental PE. No strain. Hilar mass larger and radiation pnuemonitis worsen.  [TY]  1619 CT Angio Chest PE W and/or Wo Contrast IMPRESSION: 1. Small left upper lobe segmental pulmonary embolus,  new since prior study. Minimal clot burden without right heart strain. 2. Progressive right hilar mass, with extrinsic compression of the right pulmonary artery, consistent with worsening malignancy. 3. Progressive consolidation within the right perihilar region, greatest in the right lower lobe, compatible with radiation pneumonitis. 4. Continued findings of metastatic disease, with diffuse bony metastases, mediastinal adenopathy, multiple liver lesions, and left adrenal mass as above. These are not appreciably changed since prior staging CT performed 12/09/2022. 5. 4.6 cm ascending thoracic aortic aneurysm. Ascending thoracic aortic aneurysm. Recommend semi-annual imaging followup by CTA or MRA and referral to cardiothoracic surgery if not already obtained. This recommendation follows 2010 ACCF/AHA/AATS/ACR/ASA/SCA/SCAI/SIR/STS/SVM Guidelines for the Diagnosis and Management of Patients With Thoracic Aortic Disease. Circulation. 2010; 121: X914-N829. Aortic aneurysm NOS (ICD10-I71.9) 6. Abdominal aortic aneurysm measuring 3.6 cm infrarenal. Recommend follow-up every 2 years. Reference: J Am Coll Radiol 2013;10:789-794.  Critical Value/emergent results were called by telephone at the time of interpretation on 12/27/2022 at 4:10 pm to provider [facility] , who verbally acknowledged these results.   [TY]    Clinical Course User  Index [TY] Coral Spikes, DO                                 Medical Decision Making 72 year old male presenting to the emergency department with concern for elevated heart rate, soft blood pressure.  He appeared to be SVT versus a flutter.  Heart rate improved with IV fluids, as did blood pressure.  Workup as noted in ED course.  Appears to have AKI.  Heart rate continues to be elevated despite 2 and half liters of fluids.  CT scan also showing small PE.  Heparin ordered.  Magnesium low, repleted.  Amount and/or Complexity of Data Reviewed External Data Reviewed:     Details: Reported hypoxia from cancer center notes today as well as a heart rate in the 160s. Labs: ordered. Decision-making details documented in ED Course. Radiology: ordered. Decision-making details documented in ED Course. ECG/medicine tests: ordered.  Risk OTC drugs. Prescription drug management. Decision regarding hospitalization.         Final Clinical Impression(s) / ED Diagnoses Final diagnoses:  None    Rx / DC Orders ED Discharge Orders     None         Coral Spikes, DO 12/27/22 1642

## 2022-12-27 NOTE — Progress Notes (Signed)
ANTICOAGULATION CONSULT NOTE - Initial Consult  Pharmacy Consult for Heparin Indication: pulmonary embolus  No Known Allergies  Patient Measurements: Height: 5\' 11"  (180.3 cm) Weight: 77 kg (169 lb 12.1 oz) IBW/kg (Calculated) : 75.3 Heparin Dosing Weight: actual body weight  Vital Signs: Temp: 98.4 F (36.9 C) (08/28 1136) Temp Source: Oral (08/28 1136) BP: 139/118 (08/28 1500) Pulse Rate: 115 (08/28 1500)  Labs: Recent Labs    12/27/22 1022 12/27/22 1122 12/27/22 1500  HGB 15.8 15.8  --   HCT 45.6 48.7  --   PLT 148* 171  --   CREATININE  --  1.69*  --   TROPONINIHS  --  13 17    Estimated Creatinine Clearance: 43.3 mL/min (A) (by C-G formula based on SCr of 1.69 mg/dL (H)).   Medical History: Past Medical History:  Diagnosis Date   Blurred vision, left eye    since stroke 06/2020   CVA (cerebral vascular accident) (HCC) 06/2020   DDD (degenerative disc disease), lumbar    Diabetes mellitus without complication (HCC)    History of radiation therapy    Right lung- 07/17/22-08/28/22- Dr. Antony Blackbird   Hyperlipidemia    Hypertension    Hypomagnesemia    Polio    childhood   Vitamin B 12 deficiency     Medications:  No oral anticoagulation PTA  Assessment: 72 yr male disagnosed Feb 2024 with metastatic lung cancer.  [facility] ED from [facility] due to elevated HR, hypotension, EKG with SVT.   CTAngio + PE (new since prior study). No right heart strain  Goal of Therapy:  Heparin level 0.3-0.7 units/ml Monitor platelets by anticoagulation protocol: Yes   Plan:  Baseline aPTT and PT/INR Heparin 4000 unit IV bolus x 1 Heparin gtt @ 1350 units/hr Check heparin level 8 hr after heparin started Daily heparin level & CBC  Brenn Deziel, Joselyn Glassman, PharmD 12/27/2022,4:30 PM

## 2022-12-27 NOTE — Progress Notes (Addendum)
[facility] OFFICE PROGRESS NOTE  Pahwani, Gavin Knudsen, MD [address] [place] [address]  DIAGNOSIS: Metastatic lung cancer, initially diagnosed as stage IIIb (T0 N2, M0) non-small cell lung cancer, adenocarcinoma presented with right hilar and mediastinal lymphadenopathy with no clear primary diagnosed in February 2024.   Molecular studies by [id number] that showed positive KRAS G12C mutation and PD-L1 expression of 53%.  PRIOR THERAPY: Concurrent chemoradiation with weekly carboplatin for AUC of 2 and paclitaxel 45 Mg/M2.  Last dose on 08/21/2022 status post 5 cycles.   CURRENT THERAPY: Will follow up soon to discuss treatment   INTERVAL HISTORY: Gavin Dennis 72 y.o. male returns to clinic today for a follow-up visit accompanied by his daughter.   We were not able to move forward with the visit today because he had an EKG for unstable vitals. His HR is 167 bpm and he is hypotensive. EKG showed SVT with ventricular rate of 162 bpm. He is subsequently being sent to the ER. He appeared uncomfortable and in significant pain today due to his back. He did not take any pain medication this morning due to running out. He was panting in the room. Initially, his oxygen read at 81%, but later improved to 97%.  In summary, the patient was last seen in clinic by Dr. Arbutus Ped on 08/21/2022.  He appears that the patient has been lost to follow-up since that time.  He completed a course of concurrent chemoradiation.  He underwent a total of 5 cycles total.  Dr. Arbutus Ped recommended a restaging CT scan and a follow-up visit after that time.  It looks like staff members have been trying to reach the patient but his voicemail was not set up.  Additionally, the emergency contacts listed in epic were either incorrect or no longer had associations with the patient.  The patient did present to the emergency room on 11/20/2022 in the interval for constipation.  He also had back pain and  compression fracture for which he was supposed to undergo kyphoplasty and was being treated with tramadol by one of his providers.  He presented to the emergency room again for back pain on 12/09/2022.  He also was scheduled for follow-up within 3 days of his ER visit with neurosurgery, Dr. Franky Macho, which he had an appointment on 8/13. It looks like the patient was not able to make this appointment as I do not see any notes.  He had imaging performed in the emergency room that showed mild superior endplate pathologic compression fractures and deformities at T10 which is new from prior imaging.  There is also unchanged T12 compression fracture.  CT chest showed radiation pneumonitis without any evidence of PE. The CT of the abdomen pelvis showed new metastatic disease including liver metastasis. The CT of the thoracic spine shows associated lytic lesions at T9, T10, and T12.   He missed his appointment last week to follow up in the clinic. He was scheduled here today to discuss next steps in his care.   ASSESSMENT/PLAN:  This is a very pleasant 72 year old African-American male with metastatic lung cancer, initially diagnosed as stage IIIb (T0 N2, M0), small cell lung cancer, adenocarcinoma.  The patient presented with right hilar mediastinal lymphadenopathy with no clear primary.  He was diagnosed in February 2024.  He was found to have metastatic disease to the liver and August 2024.  His molecular studies show he is positive for K-ras G12 C mutation and has a PD-L1 expression  of 53%.  He underwent a course of concurrent chemoradiation with weekly carboplatin for an AUC of 2 and paclitaxel 45 mg/m.  He is status post 5 cycles of treatment.  His last treatment was on 08/21/2022.  He was lost to follow-up since this time.  More recently he presented to the emergency room with back pain and pathologic fractures and imaging study showed concerns for metastatic disease to the liver and bones  The patient  is here today to reestablish care. However, he has unstable vitals today with tachycardia and hypotension. He is in significant pain due to back pain. His EKG shows SVT with ventricular rate of 167k.   He was subsequently transferred to the ER due to the SVT. I did tell his daughter that his scans show metastatic disease and the importance of following up with our clinic as we will need to discuss treatment options and further workup, once he is stable. I will arrange follow up once I see what the plan is if admitted and discharged.   The patient was advised to call immediately if he has any concerning symptoms in the interval. The patient voices understanding of current disease status and treatment options and is in agreement with the current care plan. All questions were answered. The patient knows to call the clinic with any problems, questions or concerns. We can certainly see the patient much sooner if necessary    Orders Placed This Encounter  Procedures   CMP (Cancer Center only)    Standing Status:   Future    Standing Expiration Date:   12/27/2023   EKG 12-Lead   EKG 12-Lead    Ordered by an unspecified provider       Raha Tennison L Seaborn Nakama, PA-C 12/27/22

## 2022-12-27 NOTE — ED Triage Notes (Signed)
Pt brought over from CX center. Pt HR was in 140s, and pt was feeling weak.  AOx4

## 2022-12-27 NOTE — H&P (Addendum)
History and Physical    Patient: Gavin Dennis JXB:[id number] DOB: 12/05/1950 DOA: 12/27/2022 DOS: the patient was seen and examined on 12/27/2022 PCP: Ollen Bowl, MD  [facility]  Chief Complaint:  Chief Complaint  Patient presents with   Tachycardia   HPI: Gavin Dennis is a 72 y.o. male with medical history significant of known lung cancer that is under treatment.  Patient also has chronic low back pain.  With impaired ability to walk as the back pain gets exacerbated by exertion.  Patient reports being in his usual state of health till approximately 3 weeks ago when he started having diarrhea.  Which is watery, no abdominal pain and no fever.  No vomiting.  2-4 loose watery bowel movements per day.  Patient was seen at cancer center today and noted to have tachycardia heart rate up to 160/min.  Blood pressure in the 90 systolic.  Patient denied any presyncope chest pain shortness of breath.  However patient was sent to the ER.  Patient is afebrile in the ER, received fluid boluses with blood pressure improving and heart rate coming down to 110/min.  Patient is asymptomatic besides his chronic complaints of low back pain.  CT chest PE study as below.  Patient has been started on heparin medical evaluation is sought.   Review of Systems: As mentioned in the history of present illness. All other systems reviewed and are negative. Past Medical History:  Diagnosis Date   Blurred vision, left eye    since stroke 06/2020   CVA (cerebral vascular accident) (HCC) 06/2020   DDD (degenerative disc disease), lumbar    Diabetes mellitus without complication (HCC)    History of radiation therapy    Right lung- 07/17/22-08/28/22- Dr. Antony Blackbird   Hyperlipidemia    Hypertension    Hypomagnesemia    Polio    childhood   Vitamin B 12 deficiency    Past Surgical History:  Procedure Laterality Date   BRONCHIAL NEEDLE ASPIRATION BIOPSY  06/12/2022    Procedure: BRONCHIAL NEEDLE ASPIRATION BIOPSIES;  Surgeon: Chilton Greathouse, MD;  Location: [facility];  Service: Cardiopulmonary;;   COLONOSCOPY     ENDOBRONCHIAL ULTRASOUND Bilateral 06/12/2022   Procedure: ENDOBRONCHIAL ULTRASOUND;  Surgeon: Chilton Greathouse, MD;  Location: [facility];  Service: Cardiopulmonary;  Laterality: Bilateral;   HEMOSTASIS CONTROL  06/12/2022   Procedure: HEMOSTASIS CONTROL;  Surgeon: Chilton Greathouse, MD;  Location: [facility];  Service: Cardiopulmonary;;   LOOP RECORDER INSERTION N/A 03/15/2022   Procedure: LOOP RECORDER INSERTION;  Surgeon: Maurice Small, MD;  Location: [facility];  Service: Cardiovascular;  Laterality: N/A;   VIDEO BRONCHOSCOPY N/A 06/12/2022   Procedure: VIDEO BRONCHOSCOPY WITHOUT FLUORO;  Surgeon: Chilton Greathouse, MD;  Location: [facility];  Service: Cardiopulmonary;  Laterality: N/A;   Social History:  reports that he quit smoking about 3 years ago. His smoking use included cigarettes. He started smoking about 33 years ago. He has a 30 pack-year smoking history. He has been exposed to tobacco smoke. He has never used smokeless tobacco. He reports that he does not currently use alcohol. He reports that he does not currently use drugs after having used the following drugs: Cocaine.  No Known Allergies  Family History  Problem Relation Age of Onset   Heart Problems Mother    Prostate cancer Father    Lung cancer Brother     Prior to Admission medications   Medication Sig Start Date End Date  Taking? Authorizing Provider  amLODipine (NORVASC) 10 MG tablet Take 1 tablet (10 mg total) by mouth daily. 11/15/20 05/16/26  Grayce Sessions, NP  atorvastatin (LIPITOR) 20 MG tablet Take 20 mg by mouth in the morning. 04/06/22   [provider]  BREZTRI AEROSPHERE 160-9-4.8 MCG/ACT AERO Inhale 2 puffs into the lungs daily. 02/27/22   [provider]  clopidogrel (PLAVIX) 75 MG tablet Take 1 tablet (75 mg total) by  mouth daily. 03/16/22   Osvaldo Shipper, MD  Cyanocobalamin (VITAMIN B-12 PO) Take 1 tablet by mouth daily.    [provider]  furosemide (LASIX) 40 MG tablet Take 40 mg by mouth daily. 08/22/22   [provider]  lidocaine (XYLOCAINE) 2 % solution Use as directed 15 mLs in the mouth or throat as needed for mouth pain. Swallow 20 mi prior to meals 08/22/22   Antony Blackbird, MD  metFORMIN (GLUCOPHAGE) 500 MG tablet Take 500 mg by mouth in the morning.    [provider]  metoprolol succinate (TOPROL-XL) 25 MG 24 hr tablet Take 25 mg by mouth daily. 06/20/22   [provider]  Omega-3 Fatty Acids (OMEGA-3 PO) Take 1 capsule by mouth in the morning. OmegaXL Joint Pain Relief & Inflammation Supplement    [provider]  omeprazole (PRILOSEC) 40 MG capsule Take 40 mg by mouth daily as needed (indigestion.). 05/25/22   [provider]  ondansetron (ZOFRAN) 4 MG tablet Take 1 tablet (4 mg total) by mouth daily as needed for nausea or vomiting. 12/09/22 12/09/23  Carollee Herter, DO  potassium chloride (KLOR-CON) 10 MEQ tablet Take 10 mEq by mouth daily. 06/13/22   [provider]  prochlorperazine (COMPAZINE) 10 MG tablet Take 1 tablet (10 mg total) by mouth every 6 (six) hours as needed for nausea or vomiting. 06/26/22   Si Gaul, MD  Study - OCEANIC-STROKE - asundexian 50 mg or placebo tablet (PI-Sethi) Take 1 tablet (50 mg total) by mouth daily. For Investigational Use Only. Take at the same time each day (preferably in the morning). Tablet should be swallowed whole with water; it CANNOT be crushed or broken. 03/16/22   Osvaldo Shipper, MD  sucralfate (CARAFATE) 1 g tablet Take 1 tablet (1 g total) by mouth 4 (four) times daily -  with meals and at bedtime. Crush and dissolve in 10 mL's of warm water, swallow 30 min prior to meals and bedtime 08/08/22   Antony Blackbird, MD  traZODone (DESYREL) 50 MG tablet Take 50 mg by mouth at bedtime as needed.  10/18/22   [provider]    Physical Exam: Vitals:   12/27/22 1215 12/27/22 1315 12/27/22 1415 12/27/22 1500  BP: (!) 104/91 (!) 126/102 (!) 125/107 (!) 139/118  Pulse: (!) 123 (!) 104 (!) 101 (!) 115  Resp: 20 (!) 22 15 (!) 22  Temp:      TempSrc:      SpO2: 98% 96% (!) 57% 92%  Weight:      Height:       General: Patient is complaining of low back pain that is chronic.  Patient pain is getting worse with exertion.  Otherwise patient appears to be in no distress Patient gives a coherent account of his history. Respiratory exam: Bilateral intravesicular Cardiovascular exam S1-S2 normal, tachycardia Abdomen all quadrants soft nontender Extremities warm without edema no focal deficits.  No focal tenderness of the spine.  Patient is on room air Data Reviewed:  Labs on Admission:  Results for  orders placed or performed during the hospital encounter of 12/27/22 (from the past 24 hour(s))  CBC with Differential     Status: Abnormal   Collection Time: 12/27/22 11:22 AM  Result Value Ref Range   WBC 12.1 (H) 4.0 - 10.5 K/uL   RBC 4.77 4.22 - 5.81 MIL/uL   Hemoglobin 15.8 13.0 - 17.0 g/dL   HCT 98.1 19.1 - 47.8 %   MCV 102.1 (H) 80.0 - 100.0 fL   MCH 33.1 26.0 - 34.0 pg   MCHC 32.4 30.0 - 36.0 g/dL   RDW 29.5 62.1 - 30.8 %   Platelets 171 150 - 400 K/uL   nRBC 0.0 0.0 - 0.2 %   Neutrophils Relative % 79 %   Neutro Abs 9.7 (H) 1.7 - 7.7 K/uL   Lymphocytes Relative 8 %   Lymphs Abs 1.0 0.7 - 4.0 K/uL   Monocytes Relative 9 %   Monocytes Absolute 1.1 (H) 0.1 - 1.0 K/uL   Eosinophils Relative 2 %   Eosinophils Absolute 0.2 0.0 - 0.5 K/uL   Basophils Relative 1 %   Basophils Absolute 0.1 0.0 - 0.1 K/uL   Immature Granulocytes 1 %   Abs Immature Granulocytes 0.10 (H) 0.00 - 0.07 K/uL  Comprehensive metabolic panel     Status: Abnormal   Collection Time: 12/27/22 11:22 AM  Result Value Ref Range   Sodium 140 135 - 145 mmol/L   Potassium 3.9 3.5 - 5.1 mmol/L   Chloride  105 98 - 111 mmol/L   CO2 16 (L) 22 - 32 mmol/L   Glucose, Bld 185 (H) 70 - 99 mg/dL   BUN 30 (H) 8 - 23 mg/dL   Creatinine, Ser 6.57 (H) 0.61 - 1.24 mg/dL   Calcium 84.6 (H) 8.9 - 10.3 mg/dL   Total Protein 7.6 6.5 - 8.1 g/dL   Albumin 3.3 (L) 3.5 - 5.0 g/dL   AST 30 15 - 41 U/L   ALT 30 0 - 44 U/L   Alkaline Phosphatase 149 (H) 38 - 126 U/L   Total Bilirubin 1.1 0.3 - 1.2 mg/dL   GFR, Estimated 43 (L) >60 mL/min   Anion gap 19 (H) 5 - 15  Troponin I (High Sensitivity)     Status: None   Collection Time: 12/27/22 11:22 AM  Result Value Ref Range   Troponin I (High Sensitivity) 13 <18 ng/L  Magnesium     Status: Abnormal   Collection Time: 12/27/22 11:22 AM  Result Value Ref Range   Magnesium 1.4 (L) 1.7 - 2.4 mg/dL  TSH     Status: None   Collection Time: 12/27/22 11:28 AM  Result Value Ref Range   TSH 3.516 0.350 - 4.500 uIU/mL  Troponin I (High Sensitivity)     Status: None   Collection Time: 12/27/22  3:00 PM  Result Value Ref Range   Troponin I (High Sensitivity) 17 <18 ng/L   Basic Metabolic Panel: Recent Labs  Lab 12/27/22 1122  NA 140  K 3.9  CL 105  CO2 16*  GLUCOSE 185*  BUN 30*  CREATININE 1.69*  CALCIUM 12.5*  MG 1.4*   Liver Function Tests: Recent Labs  Lab 12/27/22 1122  AST 30  ALT 30  ALKPHOS 149*  BILITOT 1.1  PROT 7.6  ALBUMIN 3.3*   No results for input(s): "LIPASE", "AMYLASE" in the last 168 hours. No results for input(s): "AMMONIA" in the last 168 hours. CBC: Recent Labs  Lab 12/27/22 1022 12/27/22 1122  WBC 11.5* 12.1*  NEUTROABS 9.1* 9.7*  HGB 15.8 15.8  HCT 45.6 48.7  MCV 98.1 102.1*  PLT 148* 171   Cardiac Enzymes: Recent Labs  Lab 12/27/22 1122 12/27/22 1500  TROPONINIHS 13 17    BNP (last 3 results) No results for input(s): "PROBNP" in the last 8760 hours. CBG: No results for input(s): "GLUCAP" in the last 168 hours.  Radiological Exams on Admission:  CT Angio Chest PE W and/or Wo Contrast  Result Date:  12/27/2022 CLINICAL DATA:  Tachypnea, tachycardia, generalized weakness, history of non-small cell lung cancer EXAM: CT ANGIOGRAPHY CHEST WITH CONTRAST TECHNIQUE: Multidetector CT imaging of the chest was performed using the standard protocol during bolus administration of intravenous contrast. Multiplanar CT image reconstructions and MIPs were obtained to evaluate the vascular anatomy. RADIATION DOSE REDUCTION: This exam was performed according to the departmental dose-optimization program which includes automated exposure control, adjustment of the mA and/or kV according to patient size and/or use of iterative reconstruction technique. CONTRAST:  75mL OMNIPAQUE IOHEXOL 350 MG/ML SOLN COMPARISON:  12/09/2022, 12/27/2022 FINDINGS: Cardiovascular: This is a technically adequate evaluation of the pulmonary vasculature. There is a small nonocclusive left upper lobe segmental pulmonary embolus, reference image 57/4, new since prior study. Clot burden is minimal. No other filling defects. There is extrinsic narrowing of the right main pulmonary artery and right middle lobe pulmonary artery due to increasing soft tissue density at the right hilum, worrisome for progression of disease. The heart is unremarkable without pericardial effusion. 4.5 cm ascending thoracic aortic aneurysm is identified. Evaluation of the aortic lumen is limited due to timing of contrast bolus. Stable atherosclerosis of the aorta and coronary vasculature. Mediastinum/Nodes: Thyroid, trachea, and esophagus are unremarkable. Subcarinal adenopathy measures up to 15 mm in short axis, unchanged since prior exam. Increased soft tissue density at the right hilum measures 1.8 x 3.6 cm reference image 72/4, previously having measured 2.0 x 1.7 cm. This results and increasing extrinsic compression upon the right main pulmonary artery and right middle lobe pulmonary artery. Lungs/Pleura: Upper lobe predominant emphysema again noted. Right perihilar lung  consolidation is again noted, slightly more pronounced in the right middle and right lower lobe since prior study, likely sequela of radiation pneumonitis. Trace right pleural effusion is noted. There is some minimal tree in bud nodular airspace disease within the left upper lobe, unchanged since prior exam, likely sequela of inflammation or infection. No pneumothorax. Central airways are patent. Upper Abdomen: Metastatic lesions within the liver are again identified, measuring up to 4.2 cm in the left lobe image 121/4 and 2.5 cm in the right lobe image 168/4. These lesions are more difficult to appreciate due to timing of contrast bolus. Stable left adrenal mass consistent with metastatic disease. 3.6 cm infrarenal abdominal aortic aneurysm again noted. Musculoskeletal: Large lytic lesions are seen within the left scapula, left third rib, right lateral fifth rib, inferior margin of the right scapula, T10, and T12, consistent with metastatic disease. There are other smaller lucency seen throughout the thoracic cage consistent with additional metastatic foci, also stable. Review of the MIP images confirms the above findings. IMPRESSION: 1. Small left upper lobe segmental pulmonary embolus, new since prior study. Minimal clot burden without right heart strain. 2. Progressive right hilar mass, with extrinsic compression of the right pulmonary artery, consistent with worsening malignancy. 3. Progressive consolidation within the right perihilar region, greatest in the right lower lobe, compatible with radiation pneumonitis. 4. Continued findings of metastatic disease, with diffuse bony  metastases, mediastinal adenopathy, multiple liver lesions, and left adrenal mass as above. These are not appreciably changed since prior staging CT performed 12/09/2022. 5. 4.6 cm ascending thoracic aortic aneurysm. Ascending thoracic aortic aneurysm. Recommend semi-annual imaging followup by CTA or MRA and referral to cardiothoracic  surgery if not already obtained. This recommendation follows 2010 ACCF/AHA/AATS/ACR/ASA/SCA/SCAI/SIR/STS/SVM Guidelines for the Diagnosis and Management of Patients With Thoracic Aortic Disease. Circulation. 2010; 121: Z610-R604. Aortic aneurysm NOS (ICD10-I71.9) 6. Abdominal aortic aneurysm measuring 3.6 cm infrarenal. Recommend follow-up every 2 years. Reference: J Am Coll Radiol 2013;10:789-794. Critical Value/emergent results were called by telephone at the time of interpretation on 12/27/2022 at 4:10 pm to provider [facility] , who verbally acknowledged these results. Electronically Signed   By: Sharlet Salina M.D.   On: 12/27/2022 16:10   DG Chest Portable 1 View  Result Date: 12/27/2022 CLINICAL DATA:  Tachypnea, tachycardia, and generalized weakness EXAM: PORTABLE CHEST 1 VIEW COMPARISON:  CTA chest dated 12/09/2022, chest radiograph dated 06/12/2022 FINDINGS: Asymmetric low right lung volumes with irregular right hilar opacity, likely reflecting radiation changes. No pleural effusion or pneumothorax. The heart size and mediastinal contours are within normal limits. No acute osseous abnormality. IMPRESSION: Asymmetric low right lung volumes with irregular right hilar opacity, likely reflecting radiation changes. Electronically Signed   By: Agustin Cree M.D.   On: 12/27/2022 11:50    EKG: Independently reviewed.  All of the EKGs on file in the system from today are reviewed.  Except for the EKG at 11:05 AM, which she seems to suggest PSVT or another narrow complex tachycardia.  All of the rest seem to be sinus tachycardia.   Assessment and Plan: Pulmonary embolism (HCC) Presumably acute given association with tachycardia, soft blood pressure.  Patient has been started on heparin infusion.  I will get an echo.  Certainly responded well to fluids in terms of blood pressure and tachycardia.  And patient is not requiring supplementary oxygen.  Continue with clinical monitoring  SVT (supraventricular  tachycardia) See EKG at 11:05 AM today.  It is possible that this was extreme sinus tachycardia versus PSVT.  We will maintain the patient on telemetry, but generally patient has responded to fluids.  Patient's blood pressures are sufficient to continue with beta-blocker therapy, I intend to hold other antihypertensive regimen.  AKI (acute kidney injury) (HCC) Associated with high anion gap acidosis.  This is likely multifactorial/prerenal due to low blood pressure from diarrhea as well as possibly from pulmonary embolism.  We will hydrate patient and monitor response.  Check UA  Diarrhea X 3 weeks per patient. Check c diff.   Med rec pending as patient not sure of meds.   Advance Care Planning:   Code Status: Prior full code per patient.  Consults: none at this time.  Family Communication: per patient.  Severity of Illness: The appropriate patient status for this patient is INPATIENT. Inpatient status is judged to be reasonable and necessary in order to provide the required intensity of service to ensure the patient's safety. The patient's presenting symptoms, physical exam findings, and initial radiographic and laboratory data in the context of their chronic comorbidities is felt to place them at high risk for further clinical deterioration. Furthermore, it is not anticipated that the patient will be medically stable for discharge from the hospital within 2 midnights of admission.   * I certify that at the point of admission it is my clinical judgment that the patient will require inpatient hospital care spanning beyond  2 midnights from the point of admission due to high intensity of service, high risk for further deterioration and high frequency of surveillance required.*  Author: Nolberto Hanlon, MD 12/27/2022 5:14 PM  For on call review [email or web address].

## 2022-12-27 NOTE — Assessment & Plan Note (Deleted)
Extreme sinus tachycardia versus PSVT on presentation Patient has responded to fluids Stable now, will dc telemetry

## 2022-12-27 NOTE — Assessment & Plan Note (Signed)
Associated with high anion gap acidosis.  This is likely multifactorial/prerenal due to low blood pressure from diarrhea as well as possibly from pulmonary embolism.  We will hydrate patient and monitor response.  Check UA

## 2022-12-27 NOTE — ED Notes (Signed)
ED TO INPATIENT HANDOFF REPORT  ED Nurse Name and Phone #:  Mellody Dance  [phone]  S Name/Age/Gender Gavin Dennis 72 y.o. male Room/Bed: WA08/WA08  Code Status   Code Status: Full Code  Home/SNF/Other Home Patient oriented to: self, place, time, and situation Is this baseline? Yes   Triage Complete: Triage complete  Chief Complaint Pulmonary embolism [facility]) [I26.99]  Triage Note Pt brought over from CX center. Pt HR was in 140s, and pt was feeling weak.  AOx4   Allergies No Known Allergies  Level of Care/Admitting Diagnosis ED Disposition     ED Disposition  Admit   Condition  --   Comment  Hospital Area: [facility] [[id number]]  Level of Care: Telemetry [5]  Admit to tele based on following criteria: Complex arrhythmia (Bradycardia/Tachycardia)  May admit patient to [facility] or [facility] if equivalent level of care is available:: Yes  Covid Evaluation: Asymptomatic - no recent exposure (last 10 days) testing not required  Diagnosis: Pulmonary embolism [facility]) [[id number]]  Admitting Physician: Nolberto Hanlon [[id number]]  Attending Physician: Nolberto Hanlon [[id number]]  Certification:: I certify this patient will need inpatient services for at least 2 midnights  Expected Medical Readiness: 12/30/2022          B Medical/Surgery History Past Medical History:  Diagnosis Date   Blurred vision, left eye    since stroke 06/2020   CVA (cerebral vascular accident) (HCC) 06/2020   DDD (degenerative disc disease), lumbar    Diabetes mellitus without complication (HCC)    History of radiation therapy    Right lung- 07/17/22-08/28/22- Dr. Antony Blackbird   Hyperlipidemia    Hypertension    Hypomagnesemia    Polio    childhood   Vitamin B 12 deficiency    Past Surgical History:  Procedure Laterality Date   BRONCHIAL NEEDLE ASPIRATION BIOPSY  06/12/2022   Procedure: BRONCHIAL NEEDLE ASPIRATION BIOPSIES;  Surgeon: Chilton Greathouse, MD;  Location: [facility] ENDOSCOPY;   Service: Cardiopulmonary;;   COLONOSCOPY     ENDOBRONCHIAL ULTRASOUND Bilateral 06/12/2022   Procedure: ENDOBRONCHIAL ULTRASOUND;  Surgeon: Chilton Greathouse, MD;  Location: [facility];  Service: Cardiopulmonary;  Laterality: Bilateral;   HEMOSTASIS CONTROL  06/12/2022   Procedure: HEMOSTASIS CONTROL;  Surgeon: Chilton Greathouse, MD;  Location: [facility];  Service: Cardiopulmonary;;   LOOP RECORDER INSERTION N/A 03/15/2022   Procedure: LOOP RECORDER INSERTION;  Surgeon: Maurice Small, MD;  Location: [facility]VASIVE CV LAB;  Service: Cardiovascular;  Laterality: N/A;   VIDEO BRONCHOSCOPY N/A 06/12/2022   Procedure: VIDEO BRONCHOSCOPY WITHOUT FLUORO;  Surgeon: Chilton Greathouse, MD;  Location: [facility];  Service: Cardiopulmonary;  Laterality: N/A;     A IV Location/Drains/Wounds Patient Lines/Drains/Airways Status     Active Line/Drains/Airways     Name Placement date Placement time Site Days   Peripheral IV 12/27/22 20 G Left Antecubital 12/27/22  1127  Antecubital  less than 1            Intake/Output Last 24 hours  Intake/Output Summary (Last 24 hours) at 12/27/2022 1813 Last data filed at 12/27/2022 1733 Gross per 24 hour  Intake 2400.04 ml  Output --  Net 2400.04 ml    Labs/Imaging Results for orders placed or performed during the hospital encounter of 12/27/22 (from the past 48 hour(s))  CBC with Differential     Status: Abnormal   Collection Time: 12/27/22 11:22 AM  Result Value Ref Range   WBC 12.1 (H) 4.0 - 10.5 K/uL   RBC  4.77 4.22 - 5.81 MIL/uL   Hemoglobin 15.8 13.0 - 17.0 g/dL   HCT 16.1 09.6 - 04.5 %   MCV 102.1 (H) 80.0 - 100.0 fL   MCH 33.1 26.0 - 34.0 pg   MCHC 32.4 30.0 - 36.0 g/dL   RDW 40.9 81.1 - 91.4 %   Platelets 171 150 - 400 K/uL   nRBC 0.0 0.0 - 0.2 %   Neutrophils Relative % 79 %   Neutro Abs 9.7 (H) 1.7 - 7.7 K/uL   Lymphocytes Relative 8 %   Lymphs Abs 1.0 0.7 - 4.0 K/uL   Monocytes Relative 9 %   Monocytes Absolute 1.1 (H) 0.1 - 1.0  K/uL   Eosinophils Relative 2 %   Eosinophils Absolute 0.2 0.0 - 0.5 K/uL   Basophils Relative 1 %   Basophils Absolute 0.1 0.0 - 0.1 K/uL   Immature Granulocytes 1 %   Abs Immature Granulocytes 0.10 (H) 0.00 - 0.07 K/uL    Comment: Performed at [facility], [address], [place], [place] [address]  Comprehensive metabolic panel     Status: Abnormal   Collection Time: 12/27/22 11:22 AM  Result Value Ref Range   Sodium 140 135 - 145 mmol/L   Potassium 3.9 3.5 - 5.1 mmol/L   Chloride 105 98 - 111 mmol/L   CO2 16 (L) 22 - 32 mmol/L   Glucose, Bld 185 (H) 70 - 99 mg/dL    Comment: Glucose reference range applies only to samples taken after fasting for at least 8 hours.   BUN 30 (H) 8 - 23 mg/dL   Creatinine, Ser 6.21 (H) 0.61 - 1.24 mg/dL   Calcium 30.8 (H) 8.9 - 10.3 mg/dL   Total Protein 7.6 6.5 - 8.1 g/dL   Albumin 3.3 (L) 3.5 - 5.0 g/dL   AST 30 15 - 41 U/L   ALT 30 0 - 44 U/L   Alkaline Phosphatase 149 (H) 38 - 126 U/L   Total Bilirubin 1.1 0.3 - 1.2 mg/dL   GFR, Estimated 43 (L) >60 mL/min    Comment: (NOTE) Calculated using the CKD-EPI Creatinine Equation (2021)    Anion gap 19 (H) 5 - 15    Comment: Performed at [facility], [address] [place], [place] [address]  Troponin I (High Sensitivity)     Status: None   Collection Time: 12/27/22 11:22 AM  Result Value Ref Range   Troponin I (High Sensitivity) 13 <18 ng/L    Comment: (NOTE) Elevated high sensitivity troponin I (hsTnI) values and significant  changes across serial measurements may suggest ACS but many other  chronic and acute conditions are known to elevate hsTnI results.  Refer to the "Links" section for chest pain algorithms and additional  guidance. Performed at [facility], [address] [place], [place] [address]   Magnesium     Status: Abnormal   Collection Time: 12/27/22 11:22 AM  Result Value Ref Range   Magnesium 1.4 (L) 1.7 - 2.4  mg/dL    Comment: Performed at [facility], [address] [place], [place] [address]  TSH     Status: None   Collection Time: 12/27/22 11:28 AM  Result Value Ref Range   TSH 3.516 0.350 - 4.500 uIU/mL    Comment: Performed by a 3rd Generation assay with a functional sensitivity of <=0.01 uIU/mL. Performed at [facility], [address] [place], [place] [address]   Troponin I (  High Sensitivity)     Status: None   Collection Time: 12/27/22  3:00 PM  Result Value Ref Range   Troponin I (High Sensitivity) 17 <18 ng/L    Comment: (NOTE) Elevated high sensitivity troponin I (hsTnI) values and significant  changes across serial measurements may suggest ACS but many other  chronic and acute conditions are known to elevate hsTnI results.  Refer to the "Links" section for chest pain algorithms and additional  guidance. Performed at [facility], [address] [place], [place] [address]   APTT     Status: None   Collection Time: 12/27/22  5:05 PM  Result Value Ref Range   aPTT 29 24 - 36 seconds    Comment: Performed at [facility], [address], [place], [place] [address]  Protime-INR     Status: None   Collection Time: 12/27/22  5:05 PM  Result Value Ref Range   Prothrombin Time 15.2 11.4 - 15.2 seconds   INR 1.2 0.8 - 1.2    Comment: (NOTE) INR goal varies based on device and disease states. Performed at [facility], [address] [place], [place] [address]    CT Angio Chest PE W and/or Wo Contrast  Result Date: 12/27/2022 CLINICAL DATA:  Tachypnea, tachycardia, generalized weakness, history of non-small cell lung cancer EXAM: CT ANGIOGRAPHY CHEST WITH CONTRAST TECHNIQUE: Multidetector CT imaging of the chest was performed using the standard protocol during bolus administration of intravenous contrast. Multiplanar CT image reconstructions and MIPs were obtained to evaluate the  vascular anatomy. RADIATION DOSE REDUCTION: This exam was performed according to the departmental dose-optimization program which includes automated exposure control, adjustment of the mA and/or kV according to patient size and/or use of iterative reconstruction technique. CONTRAST:  75mL OMNIPAQUE IOHEXOL 350 MG/ML SOLN COMPARISON:  12/09/2022, 12/27/2022 FINDINGS: Cardiovascular: This is a technically adequate evaluation of the pulmonary vasculature. There is a small nonocclusive left upper lobe segmental pulmonary embolus, reference image 57/4, new since prior study. Clot burden is minimal. No other filling defects. There is extrinsic narrowing of the right main pulmonary artery and right middle lobe pulmonary artery due to increasing soft tissue density at the right hilum, worrisome for progression of disease. The heart is unremarkable without pericardial effusion. 4.5 cm ascending thoracic aortic aneurysm is identified. Evaluation of the aortic lumen is limited due to timing of contrast bolus. Stable atherosclerosis of the aorta and coronary vasculature. Mediastinum/Nodes: Thyroid, trachea, and esophagus are unremarkable. Subcarinal adenopathy measures up to 15 mm in short axis, unchanged since prior exam. Increased soft tissue density at the right hilum measures 1.8 x 3.6 cm reference image 72/4, previously having measured 2.0 x 1.7 cm. This results and increasing extrinsic compression upon the right main pulmonary artery and right middle lobe pulmonary artery. Lungs/Pleura: Upper lobe predominant emphysema again noted. Right perihilar lung consolidation is again noted, slightly more pronounced in the right middle and right lower lobe since prior study, likely sequela of radiation pneumonitis. Trace right pleural effusion is noted. There is some minimal tree in bud nodular airspace disease within the left upper lobe, unchanged since prior exam, likely sequela of inflammation or infection. No pneumothorax.  Central airways are patent. Upper Abdomen: Metastatic lesions within the liver are again identified, measuring up to 4.2 cm in the left lobe image 121/4 and 2.5 cm in the right lobe image 168/4. These lesions are more difficult to appreciate due to timing of contrast bolus. Stable left  adrenal mass consistent with metastatic disease. 3.6 cm infrarenal abdominal aortic aneurysm again noted. Musculoskeletal: Large lytic lesions are seen within the left scapula, left third rib, right lateral fifth rib, inferior margin of the right scapula, T10, and T12, consistent with metastatic disease. There are other smaller lucency seen throughout the thoracic cage consistent with additional metastatic foci, also stable. Review of the MIP images confirms the above findings. IMPRESSION: 1. Small left upper lobe segmental pulmonary embolus, new since prior study. Minimal clot burden without right heart strain. 2. Progressive right hilar mass, with extrinsic compression of the right pulmonary artery, consistent with worsening malignancy. 3. Progressive consolidation within the right perihilar region, greatest in the right lower lobe, compatible with radiation pneumonitis. 4. Continued findings of metastatic disease, with diffuse bony metastases, mediastinal adenopathy, multiple liver lesions, and left adrenal mass as above. These are not appreciably changed since prior staging CT performed 12/09/2022. 5. 4.6 cm ascending thoracic aortic aneurysm. Ascending thoracic aortic aneurysm. Recommend semi-annual imaging followup by CTA or MRA and referral to cardiothoracic surgery if not already obtained. This recommendation follows 2010 ACCF/AHA/AATS/ACR/ASA/SCA/SCAI/SIR/STS/SVM Guidelines for the Diagnosis and Management of Patients With Thoracic Aortic Disease. Circulation. 2010; 121: Y782-N562. Aortic aneurysm NOS (ICD10-I71.9) 6. Abdominal aortic aneurysm measuring 3.6 cm infrarenal. Recommend follow-up every 2 years. Reference: J Am  Coll Radiol 2013;10:789-794. Critical Value/emergent results were called by telephone at the time of interpretation on 12/27/2022 at 4:10 pm to provider [facility] , who verbally acknowledged these results. Electronically Signed   By: Sharlet Salina M.D.   On: 12/27/2022 16:10   DG Chest Portable 1 View  Result Date: 12/27/2022 CLINICAL DATA:  Tachypnea, tachycardia, and generalized weakness EXAM: PORTABLE CHEST 1 VIEW COMPARISON:  CTA chest dated 12/09/2022, chest radiograph dated 06/12/2022 FINDINGS: Asymmetric low right lung volumes with irregular right hilar opacity, likely reflecting radiation changes. No pleural effusion or pneumothorax. The heart size and mediastinal contours are within normal limits. No acute osseous abnormality. IMPRESSION: Asymmetric low right lung volumes with irregular right hilar opacity, likely reflecting radiation changes. Electronically Signed   By: Agustin Cree M.D.   On: 12/27/2022 11:50    Pending Labs Unresulted Labs (From admission, onward)     Start     Ordered   12/28/22 0500  Magnesium  Tomorrow morning,   R        12/27/22 1724   12/28/22 0500  APTT  Tomorrow morning,   R        12/27/22 1733   12/28/22 0500  Protime-INR  Tomorrow morning,   R        12/27/22 1733   12/28/22 0500  Basic metabolic panel  Tomorrow morning,   R        12/27/22 1733   12/28/22 0100  Heparin level (unfractionated)  Once-Timed,   URGENT        12/27/22 1636   12/28/22 0000  CBC  Daily,   R      12/27/22 1636   12/27/22 1723  Urinalysis, Complete w Microscopic -Urine, Clean Catch  Once,   URGENT       Question:  Specimen Source  Answer:  Urine, Clean Catch   12/27/22 1722   12/27/22 1723  Sodium, urine, random  Once,   URGENT        12/27/22 1722   12/27/22 1723  Creatinine, urine, random  Once,   URGENT        12/27/22 1722   12/27/22 1722  Culture, blood (Routine X 2) w Reflex to ID Panel  BLOOD CULTURE X 2,   R (with STAT occurrences)      12/27/22 1721   12/27/22  1722  Gastrointestinal Panel by PCR , Stool  (Gastrointestinal Panel by PCR, Stool                                                                                                                                                     **Does Not include CLOSTRIDIUM DIFFICILE testing. **If CDIFF testing is needed, place order from the "C Difficile Testing" order set.**)  Once,   URGENT        12/27/22 1721   12/27/22 1716  C Difficile Quick Screen w PCR reflex  (C Difficile quick screen w PCR reflex panel )  Once, for 24 hours,   URGENT       References:    CDiff Information Tool   12/27/22 1715   12/27/22 1255  T4, free  Once,   R        12/27/22 1255            Vitals/Pain Today's Vitals   12/27/22 1215 12/27/22 1315 12/27/22 1415 12/27/22 1500  BP: (!) 104/91 (!) 126/102 (!) 125/107 (!) 139/118  Pulse: (!) 123 (!) 104 (!) 101 (!) 115  Resp: 20 (!) 22 15 (!) 22  Temp:      TempSrc:      SpO2: 98% 96% (!) 57% 92%  Weight:      Height:      PainSc:        Isolation Precautions Enteric precautions (UV disinfection)  Medications Medications  lidocaine (LIDODERM) 5 % 1 patch (1 patch Transdermal Patch Applied 12/27/22 1455)  heparin bolus via infusion 4,000 Units (4,000 Units Intravenous Bolus from Bag 12/27/22 1724)    Followed by  heparin ADULT infusion 100 units/mL (25000 units/257mL) (1,350 Units/hr Intravenous New Bag/Given 12/27/22 1722)  ringers solution (has no administration in time range)  acetaminophen (TYLENOL) tablet 650 mg (has no administration in time range)    Or  acetaminophen (TYLENOL) suppository 650 mg (has no administration in time range)  polyethylene glycol (MIRALAX / GLYCOLAX) packet 17 g (has no administration in time range)  sodium chloride flush (NS) 0.9 % injection 3 mL (has no administration in time range)  oxyCODONE (Oxy IR/ROXICODONE) immediate release tablet 5 mg (has no administration in time range)  HYDROmorphone (DILAUDID) injection 0.5 mg (has no  administration in time range)  sodium chloride 0.9 % bolus 1,000 mL (0 mLs Intravenous Stopped 12/27/22 1250)  magnesium sulfate IVPB 1 g 100 mL (0 g Intravenous Stopped 12/27/22 1400)  lactated ringers bolus 1,000 mL (0 mLs Intravenous Stopped 12/27/22 1400)  acetaminophen (TYLENOL) tablet 650 mg (650 mg Oral Given 12/27/22 1455)  iohexol (OMNIPAQUE) 350 MG/ML injection  75 mL (75 mLs Intravenous Contrast Given 12/27/22 1526)  lactated ringers bolus 500 mL (0 mLs Intravenous Stopped 12/27/22 1733)    Mobility walks with device     Focused Assessments    R Recommendations: See Admitting Provider Note  Report given to:   Additional Notes:

## 2022-12-27 NOTE — Assessment & Plan Note (Addendum)
Presumably acute given association with tachycardia, soft blood pressure in the setting of known malignancy Started on heparin infusion Responded well to fluids in terms of blood pressure and tachycardia Patient is not showing evidence of hemodynamic instability at this time On room air PESI score on admission was Class V, very high risk, 10.0-24.5% 30-day mortality in this group S-PESI score on admission was also high, indicating that the patient has a 8.9% risk of death  He may have been a candidate for intervention but is currently stable and much better in appearance and so does not appear to need intervention With such a small subsegmental PE, his presentation may be related to other considerations with PE being an incidental finding Admitted on telemetry  R heart strain was not seen on CT Initiate anticoagulation - for now, will start treatment-dose heparin with plan to transition to Eliquis at time of dc per oncology [place] O2 as needed - none currently Patients with active cancer are at high risk (>8%/year) for recurrent VTE and should be maintained on indefinite AC.

## 2022-12-28 ENCOUNTER — Inpatient Hospital Stay (HOSPITAL_COMMUNITY): Payer: Medicare HMO

## 2022-12-28 DIAGNOSIS — I2699 Other pulmonary embolism without acute cor pulmonale: Secondary | ICD-10-CM

## 2022-12-28 DIAGNOSIS — I2693 Single subsegmental pulmonary embolism without acute cor pulmonale: Secondary | ICD-10-CM

## 2022-12-28 LAB — APTT: aPTT: 125 s — ABNORMAL HIGH (ref 24–36)

## 2022-12-28 LAB — BASIC METABOLIC PANEL
Anion gap: 9 (ref 5–15)
BUN: 30 mg/dL — ABNORMAL HIGH (ref 8–23)
CO2: 22 mmol/L (ref 22–32)
Calcium: 10.9 mg/dL — ABNORMAL HIGH (ref 8.9–10.3)
Chloride: 106 mmol/L (ref 98–111)
Creatinine, Ser: 1.1 mg/dL (ref 0.61–1.24)
GFR, Estimated: >60 mL/min (ref >60)
Glucose, Bld: 110 mg/dL — ABNORMAL HIGH (ref 70–99)
Potassium: 4.2 mmol/L (ref 3.5–5.1)
Sodium: 137 mmol/L (ref 135–145)

## 2022-12-28 LAB — CBC
HCT: 40.7 % (ref 39.0–52.0)
Hemoglobin: 13.6 g/dL (ref 13.0–17.0)
MCH: 33.8 pg (ref 26.0–34.0)
MCHC: 33.4 g/dL (ref 30.0–36.0)
MCV: 101.2 fL — ABNORMAL HIGH (ref 80.0–100.0)
Platelets: 134 10*3/uL — ABNORMAL LOW (ref 150–400)
RBC: 4.02 MIL/uL — ABNORMAL LOW (ref 4.22–5.81)
RDW: 14.8 % (ref 11.5–15.5)
WBC: 8.8 10*3/uL (ref 4.0–10.5)
nRBC: 0 % (ref 0.0–0.2)

## 2022-12-28 LAB — MAGNESIUM: Magnesium: 1.6 mg/dL — ABNORMAL LOW (ref 1.7–2.4)

## 2022-12-28 LAB — HEPARIN LEVEL (UNFRACTIONATED)
Heparin Unfractionated: 0.57 [IU]/mL (ref 0.30–0.70)
Heparin Unfractionated: 0.81 [IU]/mL — ABNORMAL HIGH (ref 0.30–0.70)
Heparin Unfractionated: 0.69 [IU]/mL (ref 0.30–0.70)

## 2022-12-28 LAB — ECHOCARDIOGRAM COMPLETE
Calc EF: 69.3 %
Height: 71 in
MV M vel: 3 m/s
MV Peak grad: 36 mmHg
S' Lateral: 3.1 cm
Single Plane A2C EF: 67.2 %
Single Plane A4C EF: 70.4 %
Weight: 2716.07 [oz_av]

## 2022-12-28 LAB — GLUCOSE, CAPILLARY
Glucose-Capillary: 128 mg/dL — ABNORMAL HIGH (ref 70–99)
Glucose-Capillary: 93 mg/dL (ref 70–99)

## 2022-12-28 LAB — T4, FREE: Free T4: 1.14 ng/dL — ABNORMAL HIGH (ref 0.61–1.12)

## 2022-12-28 LAB — PROTIME-INR
INR: 1.2 (ref 0.8–1.2)
Prothrombin Time: 15.1 s (ref 11.4–15.2)

## 2022-12-28 MED ORDER — MAGNESIUM SULFATE 2 GM/50ML IV SOLN
2.0000 g | Freq: Once | INTRAVENOUS | Status: AC
  Administered 2022-12-28: 2 g via INTRAVENOUS
  Filled 2022-12-28: qty 50

## 2022-12-28 MED ORDER — TRAZODONE HCL 50 MG PO TABS
50.0000 mg | ORAL_TABLET | Freq: Every evening | ORAL | Status: DC | PRN
  Administered 2022-12-28 – 2023-01-05 (×5): 50 mg via ORAL
  Filled 2022-12-28 (×5): qty 1

## 2022-12-28 MED ORDER — ATORVASTATIN CALCIUM 20 MG PO TABS
20.0000 mg | ORAL_TABLET | Freq: Every morning | ORAL | Status: DC
  Administered 2022-12-29 – 2023-01-08 (×11): 20 mg via ORAL
  Filled 2022-12-28 (×11): qty 1

## 2022-12-28 MED ORDER — METOPROLOL SUCCINATE ER 50 MG PO TB24
25.0000 mg | ORAL_TABLET | Freq: Every day | ORAL | Status: DC
  Administered 2022-12-28 – 2023-01-08 (×12): 25 mg via ORAL
  Filled 2022-12-28 (×12): qty 1

## 2022-12-28 MED ORDER — AMLODIPINE BESYLATE 5 MG PO TABS
10.0000 mg | ORAL_TABLET | Freq: Every day | ORAL | Status: DC
  Administered 2022-12-29 – 2023-01-08 (×11): 10 mg via ORAL
  Filled 2022-12-28 (×11): qty 2

## 2022-12-28 MED ORDER — HYDRALAZINE HCL 20 MG/ML IJ SOLN: 5.0000 mg | INTRAMUSCULAR | Status: DC | PRN

## 2022-12-28 MED ORDER — INSULIN ASPART 100 UNIT/ML IJ SOLN
0.0000 [IU] | Freq: Three times a day (TID) | INTRAMUSCULAR | Status: DC
  Administered 2022-12-31 (×3): 1 [IU] via SUBCUTANEOUS
  Administered 2023-01-01: 2 [IU] via SUBCUTANEOUS
  Administered 2023-01-01 – 2023-01-02 (×3): 1 [IU] via SUBCUTANEOUS
  Administered 2023-01-02: 2 [IU] via SUBCUTANEOUS
  Administered 2023-01-02: 1 [IU] via SUBCUTANEOUS
  Administered 2023-01-03: 2 [IU] via SUBCUTANEOUS
  Administered 2023-01-03 – 2023-01-04 (×2): 1 [IU] via SUBCUTANEOUS
  Administered 2023-01-05: 2 [IU] via SUBCUTANEOUS
  Administered 2023-01-05: 1 [IU] via SUBCUTANEOUS
  Administered 2023-01-06: 2 [IU] via SUBCUTANEOUS
  Administered 2023-01-06: 1 [IU] via SUBCUTANEOUS
  Administered 2023-01-08: 2 [IU] via SUBCUTANEOUS

## 2022-12-28 NOTE — Assessment & Plan Note (Signed)
Hold Plavix while on Heparin drip, likely ok to dc since he will need AC lifelong Continue Lipitor

## 2022-12-28 NOTE — Progress Notes (Signed)
Chaplain worked to engage in an initial visit with [facility]os but he was undergoing patient care. Chaplain will follow-up later.     12/28/22 1000  Spiritual Encounters  Type of Visit Attempt (pt unavailable)  Care provided to: Pt not available

## 2022-12-28 NOTE — Progress Notes (Signed)
ANTICOAGULATION CONSULT NOTE - Follow Up Consult  Pharmacy Consult for heparin Indication: acute pulmonary embolus  No Known Allergies  Patient Measurements: Height: 5\' 11"  (180.3 cm) Weight: 77 kg (169 lb 12.1 oz) IBW/kg (Calculated) : 75.3 Heparin Dosing Weight: 77 kg  Vital Signs: BP: 161/119 (08/29 1402) Pulse Rate: 81 (08/29 1402)  Labs: Recent Labs    12/27/22 1022 12/27/22 1122 12/27/22 1500 12/27/22 1705 12/28/22 0045 12/28/22 0833 12/28/22 1813  HGB 15.8 15.8  --   --  13.6  --   --   HCT 45.6 48.7  --   --  40.7  --   --   PLT 148* 171  --   --  134*  --   --   APTT  --   --   --  29 125*  --   --   LABPROT  --   --   --  15.2 15.1  --   --   INR  --   --   --  1.2 1.2  --   --   HEPARINUNFRC  --   --   --   --  0.57 0.81* 0.69  CREATININE  --  1.69*  --   --  1.10  --   --   TROPONINIHS  --  13 17  --   --   --   --     Estimated Creatinine Clearance: 66.6 mL/min (by C-G formula based on SCr of 1.1 mg/dL).   Assessment: Patient is a 72 y.o M with metastatic NSCL cancer who presented to the ED on 12/27/22 from his oncologist office after he was found to be tachycardic and hypotensive. Chest CT on 12/27/22 showed an acute small LUL PE with no RHS.  He's currently on heparin drip for acute VTE treatment.  Today, 12/28/2022: - heparin level is 0.69, therapeutic but borderline   - hgb 13.6, plts 134K - no bleeding documented   Goal of Therapy:  Heparin level 0.3-0.7 units/ml Monitor platelets by anticoagulation protocol: Yes   Plan:  - reduce heparin drip to 1150 units/hr - check 8 hr heparin level after rate change  - monitor for s/sx bleeding    Adalberto Cole, PharmD, BCPS 12/28/2022 8:04 PM

## 2022-12-28 NOTE — Progress Notes (Signed)
ANTICOAGULATION CONSULT NOTE  Pharmacy Consult for Heparin Indication: pulmonary embolus  No Known Allergies  Patient Measurements: Height: 5\' 11"  (180.3 cm) Weight: 77 kg (169 lb 12.1 oz) IBW/kg (Calculated) : 75.3 Heparin Dosing Weight: actual body weight  Vital Signs: Temp: 97.6 F (36.4 C) (08/29 0010) Temp Source: Axillary (08/28 2355) BP: 148/103 (08/29 0010) Pulse Rate: 97 (08/29 0010)  Labs: Recent Labs    12/27/22 1022 12/27/22 1122 12/27/22 1500 12/27/22 1705 12/28/22 0045  HGB 15.8 15.8  --   --   --   HCT 45.6 48.7  --   --   --   PLT 148* 171  --   --   --   APTT  --   --   --  29  --   LABPROT  --   --   --  15.2  --   INR  --   --   --  1.2  --   HEPARINUNFRC  --   --   --   --  0.57  CREATININE  --  1.69*  --   --  1.10  TROPONINIHS  --  13 17  --   --     Estimated Creatinine Clearance: 66.6 mL/min (by C-G formula based on SCr of 1.1 mg/dL).   Medical History: Past Medical History:  Diagnosis Date   Blurred vision, left eye    since stroke 06/2020   CVA (cerebral vascular accident) (HCC) 06/2020   DDD (degenerative disc disease), lumbar    Diabetes mellitus without complication (HCC)    History of radiation therapy    Right lung- 07/17/22-08/28/22- Dr. Antony Blackbird   Hyperlipidemia    Hypertension    Hypomagnesemia    Polio    childhood   Vitamin B 12 deficiency     Medications:  No oral anticoagulation PTA  Assessment: 72 yr male disagnosed Feb 2024 with metastatic lung cancer.  Sent to ED from [facility] due to elevated HR, hypotension, EKG with SVT.   CTAngio + PE (new since prior study). No right heart strain  12/28/2022: Initial heparin level 0.57- therapeutic on IV heparin 1350 units/hr CBC: Hg 13.6, pltc 134- low but relatively stable since admission No bleeding or infusion related concerns reported by RN  Goal of Therapy:  Heparin level 0.3-0.7 units/ml Monitor platelets by anticoagulation protocol: Yes   Plan:  Continue  heparin gtt @ 1350 units/hr Check confirmatory heparin level at 0800 Daily heparin level & CBC F/U long-term anticoagulation plans  Junita Push, PharmD 12/28/2022,1:36 AM

## 2022-12-28 NOTE — Hospital Course (Addendum)
72yo male with h/o lung cancer undergoing treatment and chronic back pain who presented to the cancer center for routine appointment and was found to have tachycardia to 160.  He was sent to the ER and evaluation showed acute PE.  He was started on heparin infusion.  Echo ordered.  Improved HR (thought to be from SVT) with IVF.  Marginal BPs, BP medications held.  Also reported 3 weeks of diarrhea so C diff was ordered; however, patient reported no further diarrhea in the hospital and thinks this was related to Ensure.  Oncology consulted, will see as an outpatient.  He remains focused on aggressive treatment but will consult palliative care for assistance with pain management and GOC.  Also with diffuse MSK lesions so will consult rad onc for possible radiation to relieve pain.

## 2022-12-28 NOTE — Assessment & Plan Note (Addendum)
Continue amlodipine and Toprol XL - both were held on presentation due to hypotension but BP has uptrended and so meds were restarted Will add prn IV hydralazine

## 2022-12-28 NOTE — Progress Notes (Signed)
[facility] Summary Report / Loop Recorder 

## 2022-12-28 NOTE — Progress Notes (Signed)
Progress Note   Patient: Gavin Dennis [id number]:[id number] DOB: 1950/11/18 DOA: 12/27/2022     1 DOS: the patient was seen and examined on 12/28/2022   Brief hospital course: 72yo male with h/o lung cancer undergoing treatment and chronic back pain who presented to the cancer center for routine appointment and was found to have tachycardia to 160.  He was sent to the ER and evaluation showed acute PE.  He was started on heparin infusion.  Echo ordered.  Improved HR (thought to be from SVT) with IVF.  Marginal BPs, BP medications held.  Also reported 3 weeks of diarrhea so C diff is pending.   Assessment and Plan: * Pulmonary embolism (HCC) Presumably acute given association with tachycardia, soft blood pressure in the setting of known malignancy Started on heparin infusion Responded well to fluids in terms of blood pressure and tachycardia Patient is not showing evidence of hemodynamic instability at this time On room air PESI score on admission was Class V, very high risk, 10.0-24.5% 30-day mortality in this group S-PESI score on admission was also high, indicating that the patient has a 8.9% risk of death  He may have been a candidate for intervention but is currently stable and much better in appearance and so does not appear to need intervention With such a small subsegmental PE, his presentation may be related to other considerations with PE being an incidental finding Admitted on telemetry  R heart strain was not seen on CT Initiate anticoagulation - for now, will start treatment-dose heparin with plan to transition to alternative [facility] agent if oncology believes this to be appropriate Daisy O2 as needed - none currently Patients with active cancer are at high risk (>8%/year) for recurrent VTE and should be maintained on indefinite AC.  SVT (supraventricular tachycardia) Extreme sinus tachycardia versus PSVT on presentation Patient has responded to fluids Stable now, will likely dc telemetry  tomorrow if no further episodes  AKI (acute kidney injury) (HCC) Associated with high anion gap acidosis This is likely multifactorial/prerenal due to low blood pressure from diarrhea as well as possibly from pulmonary embolism Resolved with hydration  Diarrhea X 3 weeks per patient C diff ordered Patient reports this is related to Ensure Hold Ensure and follow He is not currently getting Ensure so if diarrhea continues it is reasonable to test for C diff  Malignant neoplasm of unspecified part of unspecified bronchus or lung (HCC) Appears to have been lost to follow up since last appointment in 07/2022 Stage IIIb (T0 N2, M0) non-small cell lung cancer, adenocarcinoma presented with right hilar and mediastinal lymphadenopathy with no clear primary diagnosed in February 2024 CTA with progressive disease and diffuse mets that were also noted on CT on 12/09/2022 Will consult Dr. Arbutus Ped (left voice mail) for further assistance  Type 2 diabetes mellitus with other specified complication (HCC) Last A1c was 6.7, good control Hold Glucophage Cover with sensitive-scale SSI   Essential hypertension Continue amlodipine and Toprol XL - both were held on presentation due to hypotension but BP has uptrended and is now high Will add prn IV hydralazine  Aneurysm of ascending aorta without rupture (HCC) 4.6 thoracic aneurysm, needs semi-annual imaging 3.6 infrarenal aneurysm, f/u q2 years  History of stroke Hold Plavix while on Heparin drip, likely ok to dc since he will need AC lifelong Continue Lipitor     Consultants: Palliative care  Procedures: None  Antibiotics: None  30 Day Unplanned Readmission Risk Score    Flowsheet Row ED  to Hosp-Admission (Current) from 12/27/2022 in [place] 4TH FLOOR PROGRESSIVE CARE AND UROLOGY  30 Day Unplanned Readmission Risk Score (%) 21.31 Filed at 12/28/2022 0801       This score is the patient's risk of an unplanned readmission within 30  days of being discharged (0 -100%). The score is based on dignosis, age, lab data, medications, orders, and past utilization.   Low:  0-14.9   Medium: 15-21.9   High: 22-29.9   Extreme: 30 and above           Subjective: He reports frontal headache today, otherwise no specific complaints.  Not eating.  Denies knowing about PE.  Acknowledges h/o lung cancer.  Thinks is diarrhea is from drinking Ensure, not sure if he had a stool since admission.   Objective: Vitals:   12/28/22 0446 12/28/22 1402  BP: (!) 124/94 (!) 161/119  Pulse: 99 81  Resp: 16   Temp: 97.7 F (36.5 C)   SpO2: 95% 97%    Intake/Output Summary (Last 24 hours) at 12/28/2022 1713 Last data filed at 12/28/2022 1404 Gross per 24 hour  Intake 748.27 ml  Output --  Net 748.27 ml   Filed Weights   12/27/22 1124  Weight: 77 kg    Exam:  General:  Appears calm and comfortable and is in NAD, mildly disheveled, frail Eyes:   normal lids ENT:  grossly normal hearing, lips & tongue, mmm Neck:  no LAD, masses or thyromegaly Cardiovascular:  RRR, no m/r/g. No LE edema.  Respiratory:   CTA bilaterally with no wheezes/rales/rhonchi.  Normal respiratory effort. Abdomen:  soft, NT, ND Skin:  no rash or induration seen on limited exam Musculoskeletal:  grossly normal tone BUE/BLE, good ROM, no bony abnormality Psychiatric:  blunted mood and affect, speech fluent and appropriate, AOx3 Neurologic:  CN 2-12 grossly intact, moves all extremities in coordinated fashion  Data Reviewed: I have reviewed the patient's lab results since admission.  Pertinent labs for today include:  Glucose 110 Calcium 10.9, down from 12.5 WBC 8.8    CTA Impression:  1. Small left upper lobe segmental pulmonary embolus, new since prior study. Minimal clot burden without right heart strain. 2. Progressive right hilar mass, with extrinsic compression of the right pulmonary artery, consistent with worsening malignancy. 3. Progressive  consolidation within the right perihilar region, greatest in the right lower lobe, compatible with radiation pneumonitis. 4. Continued findings of metastatic disease, with diffuse bony metastases, mediastinal adenopathy, multiple liver lesions, and left adrenal mass as above. These are not appreciably changed since prior staging CT performed 12/09/2022. 5. 4.6 cm ascending thoracic aortic aneurysm. Ascending thoracic aortic aneurysm. Recommend semi-annual imaging followup by CTA or MRA and referral to cardiothoracic surgery if not already obtained. This recommendation follows 2010 ACCF/AHA/AATS/ACR/ASA/SCA/SCAI/SIR/STS/SVM Guidelines for the Diagnosis and Management of Patients With Thoracic Aortic Disease. Circulation. 2010; 121: Z610-R604. Aortic aneurysm NOS (ICD10-I71.9) 6. Abdominal aortic aneurysm measuring 3.6 cm infrarenal. Recommend follow-up every 2 years.   Family Communication: None present  Disposition: Status is: Inpatient Remains inpatient appropriate because: ongoing evaluation and treatment  Planned Discharge Destination:  TBD    Time spent: 50 minutes  Author: Jonah Blue, MD 12/28/2022 5:13 PM  For on call review [email or web address].

## 2022-12-28 NOTE — Progress Notes (Signed)
ANTICOAGULATION CONSULT NOTE - Follow Up Consult  Pharmacy Consult for heparin Indication: acute pulmonary embolus  No Known Allergies  Patient Measurements: Height: 5\' 11"  (180.3 cm) Weight: 77 kg (169 lb 12.1 oz) IBW/kg (Calculated) : 75.3 Heparin Dosing Weight: 77 kg  Vital Signs: Temp: 97.7 F (36.5 C) (08/29 0446) Temp Source: Oral (08/29 0446) BP: 124/94 (08/29 0446) Pulse Rate: 99 (08/29 0446)  Labs: Recent Labs    12/27/22 1022 12/27/22 1122 12/27/22 1500 12/27/22 1705 12/28/22 0045  HGB 15.8 15.8  --   --  13.6  HCT 45.6 48.7  --   --  40.7  PLT 148* 171  --   --  134*  APTT  --   --   --  29 125*  LABPROT  --   --   --  15.2 15.1  INR  --   --   --  1.2 1.2  HEPARINUNFRC  --   --   --   --  0.57  CREATININE  --  1.69*  --   --  1.10  TROPONINIHS  --  13 17  --   --     Estimated Creatinine Clearance: 66.6 mL/min (by C-G formula based on SCr of 1.1 mg/dL).   Assessment: Patient is a 72 y.o M with metastatic NSCL cancer who presented to the ED on 12/27/22 from his oncologist office after he was found to be tachycardic and hypotensive. Chest CT on 12/27/22 showed an acute small LUL PE with no RHS.  He's currently on heparin drip for acute VTE treatment.  Today, 12/28/2022: - heparin level collected at 8:33a is supra-therapeutic at 0.81 - hgb 13.6, plts 134K - no bleeding documented   Goal of Therapy:  Heparin level 0.3-0.7 units/ml Monitor platelets by anticoagulation protocol: Yes   Plan:  - reduce heparin drip to 1250 units/hr - check 8 hr heparin level - monitor for s/sx bleeding   Pixie Burgener P 12/28/2022,6:58 AM

## 2022-12-28 NOTE — Assessment & Plan Note (Addendum)
Appears to have been lost to follow up since last appointment in 07/2022 Stage IIIb (T0 N2, M0) non-small cell lung cancer, adenocarcinoma presented with right hilar and mediastinal lymphadenopathy with no clear primary diagnosed in February 2024 CTA with progressive disease and diffuse mets that were also noted on CT on 12/09/2022 Dr. Arbutus Ped was consulted and saw patient on 8/30 He was offered palliative care/hospice but declined and wants to pursue chemoimmunotherapy He will f/u with oncology as an outpatient He has diffuse bony mets and Dr. Arbutus Ped also recommended consulting Rad Onc for possible radiation to help with his back pain; Dr. Mitzi Hansen was consulted

## 2022-12-28 NOTE — Progress Notes (Addendum)
Transition of Care [facility]) - Inpatient Brief Assessment   Patient Details  Name: Gavin Dennis MRN: [id number] Date of Birth: May 05, 1950  Transition of Care [facility]) CM/SW Contact:    Coralyn Helling, LCSW Phone Number: 12/28/2022, 9:27 AM   Clinical Narrative: TOC to follow for possible dc needs.    12/28/22 0925  TOC Brief Assessment  Insurance and Status Reviewed  Patient has primary care physician Yes  Home environment has been reviewed From home with family  Prior level of function: Assistance with ADLs, Uses walker  Prior/Current Home Services No current home services  Social Determinants of Health Reivew SDOH reviewed no interventions necessary  Readmission risk has been reviewed Yes  Transition of care needs transition of care needs identified, TOC will continue to follow    Transition of Care Asessment: Insurance and Status: Insurance coverage has been reviewed Patient has primary care physician: Yes Home environment has been reviewed: From home with family Prior level of function:: Assistance with ADLs, Uses walker Prior/Current Home Services: No current home services Social Determinants of Health Reivew: SDOH reviewed no interventions necessary Readmission risk has been reviewed: Yes Transition of care needs: transition of care needs identified, TOC will continue to follow

## 2022-12-28 NOTE — Progress Notes (Signed)
  Echocardiogram 2D Echocardiogram has been performed.  Milda Smart 12/28/2022, 10:38 AM

## 2022-12-28 NOTE — Assessment & Plan Note (Signed)
4.6 thoracic aneurysm, needs semi-annual imaging 3.6 infrarenal aneurysm, f/u q2 years

## 2022-12-28 NOTE — Progress Notes (Signed)
No bm noted at this time.

## 2022-12-28 NOTE — Assessment & Plan Note (Addendum)
Last A1c was 6.7, good control Hold Glucophage Cover with sensitive-scale SSI

## 2022-12-29 ENCOUNTER — Ambulatory Visit
Admit: 2022-12-29 | Discharge: 2022-12-29 | Disposition: A | Discharge status: Home / Self Care | Payer: Medicare HMO | Attending: Radiation Oncology | Admitting: Radiation Oncology

## 2022-12-29 DIAGNOSIS — R630 Anorexia: Secondary | ICD-10-CM | POA: Diagnosis present

## 2022-12-29 DIAGNOSIS — C787 Secondary malignant neoplasm of liver and intrahepatic bile duct: Secondary | ICD-10-CM

## 2022-12-29 LAB — CBC
HCT: 38.1 % — ABNORMAL LOW (ref 39.0–52.0)
Hemoglobin: 12.6 g/dL — ABNORMAL LOW (ref 13.0–17.0)
MCH: 32.9 pg (ref 26.0–34.0)
MCHC: 33.1 g/dL (ref 30.0–36.0)
MCV: 99.5 fL (ref 80.0–100.0)
Platelets: 149 10*3/uL — ABNORMAL LOW (ref 150–400)
RBC: 3.83 MIL/uL — ABNORMAL LOW (ref 4.22–5.81)
RDW: 14.7 % (ref 11.5–15.5)
WBC: 8.2 10*3/uL (ref 4.0–10.5)
nRBC: 0 % (ref 0.0–0.2)

## 2022-12-29 LAB — HEPARIN LEVEL (UNFRACTIONATED)
Heparin Unfractionated: 0.5 [IU]/mL (ref 0.30–0.70)
Heparin Unfractionated: 0.5 [IU]/mL (ref 0.30–0.70)

## 2022-12-29 LAB — BASIC METABOLIC PANEL
Anion gap: 7 (ref 5–15)
BUN: 22 mg/dL (ref 8–23)
CO2: 25 mmol/L (ref 22–32)
Calcium: 11.2 mg/dL — ABNORMAL HIGH (ref 8.9–10.3)
Chloride: 108 mmol/L (ref 98–111)
Creatinine, Ser: 0.88 mg/dL (ref 0.61–1.24)
GFR, Estimated: >60 mL/min (ref >60)
Glucose, Bld: 113 mg/dL — ABNORMAL HIGH (ref 70–99)
Potassium: 3.9 mmol/L (ref 3.5–5.1)
Sodium: 140 mmol/L (ref 135–145)

## 2022-12-29 LAB — GLUCOSE, CAPILLARY
Glucose-Capillary: 102 mg/dL — ABNORMAL HIGH (ref 70–99)
Glucose-Capillary: 106 mg/dL — ABNORMAL HIGH (ref 70–99)
Glucose-Capillary: 94 mg/dL (ref 70–99)
Glucose-Capillary: 100 mg/dL — ABNORMAL HIGH (ref 70–99)

## 2022-12-29 MED ORDER — LACTATED RINGERS IV SOLN
INTRAVENOUS | Status: DC
  Administered 2022-12-29 – 2023-01-02 (×4): 75 mL/h via INTRAVENOUS

## 2022-12-29 NOTE — Progress Notes (Signed)
Radiation Oncology         ([phone]) [phone] ________________________________  Name: Gavin Dennis        MRN: [id number]  Date of Service: 12/29/2022 DOB: 10-07-1950  YS:AYTKZSW, [name], MD  No ref. provider found     REFERRING PHYSICIAN: No ref. provider found Dr. Arbutus Ped   DIAGNOSIS: 72 yo male with metastases to the T-spine from lung primary   HISTORY OF PRESENT ILLNESS: Gavin Dennis is a 72 y.o. male seen at the request of Dr. Arbutus Ped for spinal metastases from a lung cancer primary. He was originally diagnosed with stage IIIb (T0, N2, M0) non-small cell lung cancer adenocarcinoma in February of 2024. He was treated with a course of concurrent chemoradiation under the care of Dr. Arbutus Ped and Dr. Roselind Messier. He completed his treatment in late April. It seems he was lost to follow-up at that time.   He presented to the emergency room for back pain on 12/09/2022. CT of the thoracic spine showed pathologic compression fracture deformities at T10, endplate pathologic compression fracture at T12, and lytic metastases at T9, T10, and T12. CT of the L-spine showed mild-moderate degenerative changes with no signs of metastatic disease. CT A/P evidence of new liver metastases, suspicion of early new left adrenal metastasis, and new iliac bone metastasis. He was scheduled for follow-up within 3 days of his ER visit with neurosurgeon, Dr. Franky Macho, which he had an appointment on 8/13. It looks like the patient was not able to make this appointment.  The patient was seen by Cassie Heilingoetter, PA-C on 12/27/22 to re-establish care. His vital signs were unstable at this visit, so he was transferred to the emergency room. The patient was in SVT and was admitted to the hospital for further management. CT angiogram of the chest showed a small left upper lobe segmental pulmonary embolus, new since the prior exam; progressive right hilar mass with extrinsic compression of the right pulmonary artery, consistent with  worsening malignancy; a progressive consolidation within the right perihilar region greatest in the right lower lobe, compatible with radiation pneumonitis. For his SVT, he was treated with amlodipine and metoprolol and given a follow-up visit with cardiology. He was placed on a heparin drip for his pulmonary embolism.   Dr. Arbutus Ped saw the patient this morning. He discussed his treatment options of palliative care and hospice referral versus consideration of treatment with combination of chemoimmunotherapy or treatment with targeted therapy for his positive KRAS G12C mutation. Patient was interested in additional treatment. He will see Dr. Arbutus Ped outpatient to discuss his treatment options further. Dr. Arbutus Ped kindly referred the patient to discuss palliative treatment options for the patient's back pain.   At bedside, the patient is complaining of 9/10 back pain. It sounds like his nurse is giving him his pain medication soon. The pain is localized to his lower spine. He is not able to point to the area because it is too painful for him. He denies any lower extremity weakness or numbness. He endorses leg tingling that has been present for months.   PREVIOUS RADIATION THERAPY: Yes, Dr. Roselind Messier  ==========DELIVERED PLANS==========  First Treatment Date: 2022-07-17 - Last Treatment Date: 2022-08-28   Plan Name: Lung_R Site: Mediastinum Technique: IMRT Mode: Photon Dose Per Fraction: 1.8 Gy Prescribed Dose (Delivered / Prescribed): 50.4 Gy / 50.4 Gy Prescribed Fxs (Delivered / Prescribed): 28 / 28   PAST MEDICAL HISTORY:  Past Medical History:  Diagnosis Date   Blurred vision, left eye    since  stroke 06/2020   CVA (cerebral vascular accident) (HCC) 06/2020   DDD (degenerative disc disease), lumbar    Diabetes mellitus without complication (HCC)    History of radiation therapy    Right lung- 07/17/22-08/28/22- Dr. Antony Blackbird   Hyperlipidemia    Hypertension    Hypomagnesemia    Polio     childhood   Vitamin B 12 deficiency        PAST SURGICAL HISTORY: Past Surgical History:  Procedure Laterality Date   BRONCHIAL NEEDLE ASPIRATION BIOPSY  06/12/2022   Procedure: BRONCHIAL NEEDLE ASPIRATION BIOPSIES;  Surgeon: Chilton Greathouse, MD;  Location: [facility];  Service: Cardiopulmonary;;   COLONOSCOPY     ENDOBRONCHIAL ULTRASOUND Bilateral 06/12/2022   Procedure: ENDOBRONCHIAL ULTRASOUND;  Surgeon: Chilton Greathouse, MD;  Location: [facility];  Service: Cardiopulmonary;  Laterality: Bilateral;   HEMOSTASIS CONTROL  06/12/2022   Procedure: HEMOSTASIS CONTROL;  Surgeon: Chilton Greathouse, MD;  Location: [facility];  Service: Cardiopulmonary;;   LOOP RECORDER INSERTION N/A 03/15/2022   Procedure: LOOP RECORDER INSERTION;  Surgeon: Maurice Small, MD;  Location: [facility];  Service: Cardiovascular;  Laterality: N/A;   VIDEO BRONCHOSCOPY N/A 06/12/2022   Procedure: VIDEO BRONCHOSCOPY WITHOUT FLUORO;  Surgeon: Chilton Greathouse, MD;  Location: [facility];  Service: Cardiopulmonary;  Laterality: N/A;     FAMILY HISTORY:  Family History  Problem Relation Age of Onset   Heart Problems Mother    Prostate cancer Father    Lung cancer Brother      SOCIAL HISTORY:  reports that he quit smoking about 3 years ago. His smoking use included cigarettes. He started smoking about 33 years ago. He has a 30 pack-year smoking history. He has been exposed to tobacco smoke. He has never used smokeless tobacco. He reports that he does not currently use alcohol. He reports that he does not currently use drugs after having used the following drugs: Cocaine.   ALLERGIES: Patient has no known allergies.   MEDICATIONS:  Current Facility-Administered Medications  Medication Dose Route Frequency Provider Last Rate Last Admin   acetaminophen (TYLENOL) tablet 650 mg  650 mg Oral Q6H PRN Nolberto Hanlon, MD       Or   acetaminophen (TYLENOL) suppository 650 mg  650 mg Rectal Q6H PRN Nolberto Hanlon, MD       amLODipine (NORVASC) tablet 10 mg  10 mg Oral Daily Jonah Blue, MD   10 mg at 12/29/22 0933   atorvastatin (LIPITOR) tablet 20 mg  20 mg Oral q AM Jonah Blue, MD   20 mg at 12/29/22 0933   heparin ADULT infusion 100 units/mL (25000 units/28mL)  1,150 Units/hr Intravenous Continuous Jonah Blue, MD 11.5 mL/hr at 12/29/22 1515 1,150 Units/hr at 12/29/22 1515   hydrALAZINE (APRESOLINE) injection 5 mg  5 mg Intravenous Q4H PRN Jonah Blue, MD       HYDROmorphone (DILAUDID) injection 0.5 mg  0.5 mg Intravenous Q3H PRN Nolberto Hanlon, MD       insulin aspart (novoLOG) injection 0-9 Units  0-9 Units Subcutaneous TID WC Jonah Blue, MD       lactated ringers infusion   Intravenous Continuous Jonah Blue, MD       metoprolol succinate (TOPROL-XL) 24 hr tablet 25 mg  25 mg Oral Daily Jonah Blue, MD   25 mg at 12/29/22 3295   oxyCODONE (Oxy IR/ROXICODONE) immediate release tablet 5 mg  5 mg Oral Q4H PRN Nolberto Hanlon, MD   5 mg at 12/28/22 2242  polyethylene glycol (MIRALAX / GLYCOLAX) packet 17 g  17 g Oral Daily PRN Nolberto Hanlon, MD       sodium chloride flush (NS) 0.9 % injection 3 mL  3 mL Intravenous Q12H Nolberto Hanlon, MD       traZODone (DESYREL) tablet 50 mg  50 mg Oral QHS PRN Jonah Blue, MD   50 mg at 12/28/22 2242     REVIEW OF SYSTEMS: Notable for that above.      PHYSICAL EXAM:  Wt Readings from Last 3 Encounters:  12/27/22 169 lb 12.1 oz (77 kg)  12/08/22 170 lb (77.1 kg)  10/11/22 170 lb (77.1 kg)   Temp Readings from Last 3 Encounters:  12/29/22 98.6 F (37 C) (Oral)  12/27/22 98.4 F (36.9 C) (Oral)  12/09/22 98.3 F (36.8 C)   BP Readings from Last 3 Encounters:  12/29/22 (!) 119/96  12/27/22 (!) 84/68  12/09/22 (!) 140/107   Pulse Readings from Last 3 Encounters:  12/29/22 95  12/27/22 (!) 165  12/09/22 (!) 113   Pain Assessment Pain Score: 0-No pain/10  In general this is a well appearing male in no significant  pain. He's alert and oriented x4 and appropriate throughout the examination. Cardiopulmonary assessment is negative for acute distress and he exhibits normal effort.   5/5 muscle strength in bilateral lower extremities. Dorsiflexion and plantar flexion in place. Sensation grossly intact in bilateral lower extremities.    ECOG = 4  0 - Asymptomatic (Fully active, able to carry on all predisease activities without restriction)  1 - Symptomatic but completely ambulatory (Restricted in physically strenuous activity but ambulatory and able to carry out work of a light or sedentary nature. For example, light housework, office work)  2 - Symptomatic, <50% in bed during the day (Ambulatory and capable of all self care but unable to carry out any work activities. Up and about more than 50% of waking hours)  3 - Symptomatic, >50% in bed, but not bedbound (Capable of only limited self-care, confined to bed or chair 50% or more of waking hours)  4 - Bedbound (Completely disabled. Cannot carry on any self-care. Totally confined to bed or chair)  5 - Death   Santiago Glad MM, Creech RH, Tormey DC, et al. [phone]). "Toxicity and response criteria of the [facility]". Am. Evlyn Clines. Oncol. 5 (6): 649-55    LABORATORY DATA:  Lab Results  Component Value Date   WBC 8.2 12/29/2022   HGB 12.6 (L) 12/29/2022   HCT 38.1 (L) 12/29/2022   MCV 99.5 12/29/2022   PLT 149 (L) 12/29/2022   Lab Results  Component Value Date   NA 140 12/29/2022   K 3.9 12/29/2022   CL 108 12/29/2022   CO2 25 12/29/2022   Lab Results  Component Value Date   ALT 30 12/27/2022   AST 30 12/27/2022   ALKPHOS 149 (H) 12/27/2022   BILITOT 1.1 12/27/2022      RADIOGRAPHY: ECHOCARDIOGRAM COMPLETE  Result Date: 12/28/2022    ECHOCARDIOGRAM REPORT   Patient Name:   SHAMIR CHACKO Date of Exam: 12/28/2022 Medical Rec #:  [id number]    Height:       71.0 in Accession #:    [id number]   Weight:       169.8 lb Date of  Birth:  Nov 17, 1950    BSA:          1.967 m Patient Age:    71 years     BP:  124/94 mmHg Patient Gender: M            HR:           100 bpm. Exam Location:  Inpatient Procedure: 2D Echo, Cardiac Doppler and Color Doppler Indications:    Pulmonary embolus  History:        Patient has prior history of Echocardiogram examinations, most                 recent 03/13/2022. Stroke; Risk Factors:Dyslipidemia,                 Hypertension and Diabetes. Lung CA, hx of radiation therapy.  Sonographer:    Milda Smart Referring Phys: [id number] [facility] GO[facility]  Sonographer Comments: Suboptimal parasternal window. Image acquisition challenging due to patient body habitus and Image acquisition challenging due to respiratory motion. IMPRESSIONS  1. Left ventricular ejection fraction, by estimation, is 60 to 65%. The left ventricle has normal function. The left ventricle has no regional wall motion abnormalities. There is mild left ventricular hypertrophy. Left ventricular diastolic parameters are indeterminate.  2. Right ventricular systolic function is normal. The right ventricular size is mildly enlarged. There is normal pulmonary artery systolic pressure. The estimated right ventricular systolic pressure is 19.3 mmHg.  3. The mitral valve is grossly normal. Trivial mitral valve regurgitation. No evidence of mitral stenosis.  4. The aortic valve is tricuspid. There is mild calcification of the aortic valve. There is mild thickening of the aortic valve. Aortic valve regurgitation is not visualized. No aortic stenosis is present.  5. The inferior vena cava is normal in size with greater than 50% respiratory variability, suggesting right atrial pressure of 3 mmHg. FINDINGS  Left Ventricle: Left ventricular ejection fraction, by estimation, is 60 to 65%. The left ventricle has normal function. The left ventricle has no regional wall motion abnormalities. The left ventricular internal cavity size was normal in size. There is   mild left ventricular hypertrophy. Left ventricular diastolic parameters are indeterminate. Right Ventricle: The right ventricular size is mildly enlarged. No increase in right ventricular wall thickness. Right ventricular systolic function is normal. There is normal pulmonary artery systolic pressure. The tricuspid regurgitant velocity is 2.02  m/s, and with an assumed right atrial pressure of 3 mmHg, the estimated right ventricular systolic pressure is 19.3 mmHg. Left Atrium: Left atrial size was normal in size. Right Atrium: Right atrial size was normal in size. Pericardium: There is no evidence of pericardial effusion. Mitral Valve: The mitral valve is grossly normal. Trivial mitral valve regurgitation. No evidence of mitral valve stenosis. Tricuspid Valve: The tricuspid valve is normal in structure. Tricuspid valve regurgitation is mild . No evidence of tricuspid stenosis. Aortic Valve: The aortic valve is tricuspid. There is mild calcification of the aortic valve. There is mild thickening of the aortic valve. Aortic valve regurgitation is not visualized. No aortic stenosis is present. Pulmonic Valve: The pulmonic valve was normal in structure. Pulmonic valve regurgitation is not visualized. No evidence of pulmonic stenosis. Aorta: The aortic root is normal in size and structure. Venous: The inferior vena cava is normal in size with greater than 50% respiratory variability, suggesting right atrial pressure of 3 mmHg. IAS/Shunts: No atrial level shunt detected by color flow Doppler.  LEFT VENTRICLE PLAX 2D LVIDd:         3.90 cm     Diastology LVIDs:         3.10 cm     LV e' medial:  6.09 cm/s  LV PW:         1.10 cm     LV e' lateral: 8.59 cm/s LV IVS:        1.10 cm  LV Volumes (MOD) LV vol d, MOD A2C: 40.5 ml LV vol d, MOD A4C: 59.8 ml LV vol s, MOD A2C: 13.3 ml LV vol s, MOD A4C: 17.7 ml LV SV MOD A2C:     27.2 ml LV SV MOD A4C:     59.8 ml LV SV MOD BP:      34.2 ml RIGHT VENTRICLE             IVC RV S  prime:     12.10 cm/s  IVC diam: 1.90 cm TAPSE (M-mode): 2.0 cm LEFT ATRIUM             Index LA diam:        3.20 cm 1.63 cm/m LA Vol (A2C):   22.9 ml 11.64 ml/m LA Vol (A4C):   29.8 ml 15.15 ml/m LA Biplane Vol: 27.1 ml 13.78 ml/m  AORTIC VALVE LVOT Vmax:   93.30 cm/s LVOT Vmean:  56.100 cm/s LVOT VTI:    0.126 m  AORTA Ao Root diam: 3.80 cm MR Peak grad: 36.0 mmHg   TRICUSPID VALVE MR Vmax:      300.00 cm/s TR Peak grad:   16.3 mmHg                           TR Vmax:        202.00 cm/s                            SHUNTS                           Systemic VTI: 0.13 m Weston Brass MD Electronically signed by Weston Brass MD Signature Date/Time: 12/28/2022/1:57:25 PM    Final    CT Angio Chest PE W and/or Wo Contrast  Result Date: 12/27/2022 CLINICAL DATA:  Tachypnea, tachycardia, generalized weakness, history of non-small cell lung cancer EXAM: CT ANGIOGRAPHY CHEST WITH CONTRAST TECHNIQUE: Multidetector CT imaging of the chest was performed using the standard protocol during bolus administration of intravenous contrast. Multiplanar CT image reconstructions and MIPs were obtained to evaluate the vascular anatomy. RADIATION DOSE REDUCTION: This exam was performed according to the departmental dose-optimization program which includes automated exposure control, adjustment of the mA and/or kV according to patient size and/or use of iterative reconstruction technique. CONTRAST:  75mL OMNIPAQUE IOHEXOL 350 MG/ML SOLN COMPARISON:  12/09/2022, 12/27/2022 FINDINGS: Cardiovascular: This is a technically adequate evaluation of the pulmonary vasculature. There is a small nonocclusive left upper lobe segmental pulmonary embolus, reference image 57/4, new since prior study. Clot burden is minimal. No other filling defects. There is extrinsic narrowing of the right main pulmonary artery and right middle lobe pulmonary artery due to increasing soft tissue density at the right hilum, worrisome for progression of  disease. The heart is unremarkable without pericardial effusion. 4.5 cm ascending thoracic aortic aneurysm is identified. Evaluation of the aortic lumen is limited due to timing of contrast bolus. Stable atherosclerosis of the aorta and coronary vasculature. Mediastinum/Nodes: Thyroid, trachea, and esophagus are unremarkable. Subcarinal adenopathy measures up to 15 mm in short axis, unchanged since prior exam. Increased soft tissue density at the right hilum measures 1.8 x 3.6 cm reference  image 72/4, previously having measured 2.0 x 1.7 cm. This results and increasing extrinsic compression upon the right main pulmonary artery and right middle lobe pulmonary artery. Lungs/Pleura: Upper lobe predominant emphysema again noted. Right perihilar lung consolidation is again noted, slightly more pronounced in the right middle and right lower lobe since prior study, likely sequela of radiation pneumonitis. Trace right pleural effusion is noted. There is some minimal tree in bud nodular airspace disease within the left upper lobe, unchanged since prior exam, likely sequela of inflammation or infection. No pneumothorax. Central airways are patent. Upper Abdomen: Metastatic lesions within the liver are again identified, measuring up to 4.2 cm in the left lobe image 121/4 and 2.5 cm in the right lobe image 168/4. These lesions are more difficult to appreciate due to timing of contrast bolus. Stable left adrenal mass consistent with metastatic disease. 3.6 cm infrarenal abdominal aortic aneurysm again noted. Musculoskeletal: Large lytic lesions are seen within the left scapula, left third rib, right lateral fifth rib, inferior margin of the right scapula, T10, and T12, consistent with metastatic disease. There are other smaller lucency seen throughout the thoracic cage consistent with additional metastatic foci, also stable. Review of the MIP images confirms the above findings. IMPRESSION: 1. Small left upper lobe segmental  pulmonary embolus, new since prior study. Minimal clot burden without right heart strain. 2. Progressive right hilar mass, with extrinsic compression of the right pulmonary artery, consistent with worsening malignancy. 3. Progressive consolidation within the right perihilar region, greatest in the right lower lobe, compatible with radiation pneumonitis. 4. Continued findings of metastatic disease, with diffuse bony metastases, mediastinal adenopathy, multiple liver lesions, and left adrenal mass as above. These are not appreciably changed since prior staging CT performed 12/09/2022. 5. 4.6 cm ascending thoracic aortic aneurysm. Ascending thoracic aortic aneurysm. Recommend semi-annual imaging followup by CTA or MRA and referral to cardiothoracic surgery if not already obtained. This recommendation follows 2010 ACCF/AHA/AATS/ACR/ASA/SCA/SCAI/SIR/STS/SVM Guidelines for the Diagnosis and Management of Patients With Thoracic Aortic Disease. Circulation. 2010; 121: X324-M010. Aortic aneurysm NOS (ICD10-I71.9) 6. Abdominal aortic aneurysm measuring 3.6 cm infrarenal. Recommend follow-up every 2 years. Reference: J Am Coll Radiol 2013;10:789-794. Critical Value/emergent results were called by telephone at the time of interpretation on 12/27/2022 at 4:10 pm to provider [facility] , who verbally acknowledged these results. Electronically Signed   By: Sharlet Salina M.D.   On: 12/27/2022 16:10   DG Chest Portable 1 View  Result Date: 12/27/2022 CLINICAL DATA:  Tachypnea, tachycardia, and generalized weakness EXAM: PORTABLE CHEST 1 VIEW COMPARISON:  CTA chest dated 12/09/2022, chest radiograph dated 06/12/2022 FINDINGS: Asymmetric low right lung volumes with irregular right hilar opacity, likely reflecting radiation changes. No pleural effusion or pneumothorax. The heart size and mediastinal contours are within normal limits. No acute osseous abnormality. IMPRESSION: Asymmetric low right lung volumes with irregular right  hilar opacity, likely reflecting radiation changes. Electronically Signed   By: Agustin Cree M.D.   On: 12/27/2022 11:50   CUP PACEART REMOTE DEVICE CHECK  Result Date: 12/18/2022 ILR summary report received. Battery status OK. Normal device function. No new symptom, brady, or pause episodes. No new AF episodes. Monthly summary reports and ROV/PRN 2 tachy events previously reviewed as alert, HR's 167, <80min, narrow regular rhythm LA, CVRS  CT ABDOMEN PELVIS W CONTRAST  Result Date: 12/09/2022 CLINICAL DATA:  72 year old male with back pain after lifting. Non-small cell lung cancer. Evidence of skeletal metastases, acute and nonacute thoracic compression fractures on CT thoracic  and lumbar spine earlier today. EXAM: CT ABDOMEN AND PELVIS WITH CONTRAST TECHNIQUE: Multidetector CT imaging of the abdomen and pelvis was performed using the standard protocol following bolus administration of intravenous contrast. RADIATION DOSE REDUCTION: This exam was performed according to the departmental dose-optimization program which includes automated exposure control, adjustment of the mA and/or kV according to patient size and/or use of iterative reconstruction technique. CONTRAST:  OMNIPAQUE IOHEXOL 350 MG/ML SOLN COMPARISON:  PET-CT 04/21/2022. Lumbar spine CT 0025 hours today. CTA chest reported separately. FINDINGS: Lower chest: CTA chest reported separately. Hepatobiliary: Multiple hypodense and ill-defined liver metastases are new from the December PET-CT. The largest is in the medial left hepatic lobe, abutting the hepatic veins and up to 4.2 cm long axis (coronal image 94). At least 4 individual metastases are identified. Gallbladder is distended but otherwise within normal limits. Pancreas: Partial pancreatic atrophy and some pancreatic ductal dilatation but no mass or inflammation. Spleen: Negative. Adrenals/Urinary Tract: 1 cm hypoenhancing area in the left adrenal gland which appears slightly enlarged since  last year is suspicious for early adrenal metastasis on series 5, image 17. Right adrenal gland is stable. Multiple simple renal cysts (no follow-up imaging recommended). Nonobstructed kidneys with symmetric renal enhancement. No hydroureter. Negative bladder. Stomach/Bowel: Large bowel redundancy and diverticulosis. No active large bowel inflammation. No dilated large or small bowel. Decompressed terminal ileum. Appendix not identified, may be diminutive or absent. Decompressed stomach. Complex duodenal diverticula in the midline and at the junction with the ligament of Treitz, individually these are nearly 7 cm with some retained food/secretions. But there is no regional inflammation. Underlying duodenum and proximal small bowel remain decompressed. And there are occasional additional small bowel diverticula. No free fluid or free air. Vascular/Lymphatic: Infrarenal abdominal aortic aneurysm, up to 3.6 cm with mural plaque or thrombus on series 5, image 38. This is stable since last year. Underlying extensive Aortoiliac calcified atherosclerosis. Major arterial structures in the abdomen and pelvis remain patent from last year. Early femoral venous contrast timing, mixing artifact. No lymphadenopathy identified. Reproductive: Negative. Other: No free fluid in the pelvis. Musculoskeletal: Thoracic spine and lumbar spine stable from dedicated CTs earlier today. Lytic new left medial iliac bone metastasis on series 5, image 43, approximately 18 mm. Similar contralateral medial right iliac bone metastasis on the same image. These are new by CT from the PET-CT last year. No other obvious pelvic bone metastasis. IMPRESSION: 1. Evidence of progressed Metastatic Lung Cancer from December PET-CT: - new liver metastases (up to 4.2 cm). - suspicion of early new left adrenal metastasis. - new iliac bone metastases. 2. No superimposed inflammatory process identified in the abdomen or pelvis. Large and small bowel  diverticulosis. 3. Stable Infrarenal Abdominal Aortic Aneurysm, 3.6 cm. Recommend follow-up every 2 years. Reference: J Am Coll Radiol 2013;10:789-794. Aortic Atherosclerosis (ICD10-I70.0). aortic Atherosclerosis (ICD10-I70.0). Electronically Signed   By: Odessa Fleming M.D.   On: 12/09/2022 06:41   CT Angio Chest PE W and/or Wo Contrast  Result Date: 12/09/2022 CLINICAL DATA:  72 year old male with back pain after lifting. Non-small cell lung cancer. Evidence of skeletal metastases, acute and nonacute thoracic compression fractures on CT thoracic and lumbar spine earlier today. EXAM: CT ANGIOGRAPHY CHEST WITH CONTRAST TECHNIQUE: Multidetector CT imaging of the chest was performed using the standard protocol during bolus administration of intravenous contrast. Multiplanar CT image reconstructions and MIPs were obtained to evaluate the vascular anatomy. RADIATION DOSE REDUCTION: This exam was performed according to the departmental dose-optimization program  which includes automated exposure control, adjustment of the mA and/or kV according to patient size and/or use of iterative reconstruction technique. CONTRAST:  OMNIPAQUE IOHEXOL 350 MG/ML SOLN COMPARISON:  Thoracic spine CT 0024 hours today. Chest CT 08/25/2022 without contrast. FINDINGS: Cardiovascular: Excellent contrast bolus timing in the pulmonary arterial tree. Respiratory motion in the middle and lower lobes. Elsewhere no pulmonary artery filling defect identified. Little to no aortic contrast. Stable tortuosity of the aorta. Calcified aortic atherosclerosis. No cardiomegaly or pericardial effusion. Mediastinum/Nodes: Asymmetric right hilar soft tissue on series 2, image 60. But overall mediastinal lymphadenopathy appears regressed compared to PET-CT 04/21/2022. Lungs/Pleura: Major airways remain patent with new confluent and somewhat linear arrangement of right medial lung peribronchial and parenchymal opacity which is probably sequelae of radiation  pneumonitis. Underlying centrilobular emphysema. No pneumothorax or pleural effusion. No acute finding in the left lung. Upper Abdomen: CT Abdomen and Pelvis is reported separately. Musculoskeletal: Thoracic spine is stable from the CT at 0024 hours today. Evidence of lytic vertebral metastatic disease at T9 and T10, probably also T12. Review of the MIP images confirms the above findings. IMPRESSION: 1. Negative for acute pulmonary embolus. 2. 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 And malignant mediastinal lymphadenopathy appears regressed from last year. 3. Skeletal metastatic disease, new/progressed from last year including left 5th rib, T9 and T10 lytic metastases. Thoracic spine stable from dedicated thoracic CT earlier today. 4. CT Abdomen and Pelvis is reported separately. 5. Aortic Atherosclerosis (ICD10-I70.0) and Emphysema (ICD10-J43.9). Electronically Signed   By: Odessa Fleming M.D.   On: 12/09/2022 06:32   CT Thoracic Spine Wo Contrast  Result Date: 12/09/2022 CLINICAL DATA:  Low back pain, lifting injury, fracture on prior MRI. Known primary lung cancer when correlating with prior studies. EXAM: CT THORACIC SPINE WITHOUT CONTRAST TECHNIQUE: Multidetector CT images of the thoracic were obtained using the standard protocol without intravenous contrast. RADIATION DOSE REDUCTION: This exam was performed according to the departmental dose-optimization program which includes automated exposure control, adjustment of the mA and/or kV according to patient size and/or use of iterative reconstruction technique. COMPARISON:  MRI lumbar spine dated 10/22/2022. CT chest dated 08/25/2022 FINDINGS: Alignment: Normal thoracic kyphosis. Vertebrae: 9 mm lytic lesion/metastasis along the left inferior aspect of the T9 vertebral body (series 8/image 91). This is new from prior CT. Mild superior endplate compression fracture deformity at T10, with 20% loss of height. Associated lytic  lesions/metastasis involving the left transverse process and adjacent medial left 10th rib (series 8/image 96), measuring approximately 3.8 x 2.5 cm in aggregate. This is new from prior CT. Mild superior endplate compression fracture deformity at T12, similar to prior MRI, new from prior CT. Associated 3.1 cm lytic metastasis in the left superior vertebral body (series 8/image 124), new from prior CT although likely present (although less conspicuous) on prior MRI. Paraspinal and other soft tissues: Patchy right lung opacities (for example, series 2/image 68), new. Mild centrilobular and paraseptal emphysematous changes. Trace right pleural effusion. Atherosclerotic calcifications of the aortic arch. Disc levels: T10 and T12 compression fracture deformities, described above. No retropulsion. Mild degenerative changes of the mid/lower thoracic spine. IMPRESSION: Mild superior endplate pathologic compression fracture deformities at T10, new from prior CT. Mild superior endplate pathologic compression fracture deformity at T12, unchanged from a recent MR, new from prior CT. Associated lytic metastases, as above. Associated lytic metastases at T9, T10, and T12, as above, new from prior CT. Patchy right lung opacities, new, poorly evaluated. Trace  right pleural effusion. Consider dedicated CT chest with contrast for further evaluation. Aortic Atherosclerosis (ICD10-I70.0) and Emphysema (ICD10-J43.9). Electronically Signed   By: Charline Bills M.D.   On: 12/09/2022 01:02   CT Lumbar Spine Wo Contrast  Result Date: 12/09/2022 CLINICAL DATA:  Low back pain, lifting injury EXAM: CT LUMBAR SPINE WITHOUT CONTRAST TECHNIQUE: Multidetector CT imaging of the lumbar spine was performed without intravenous contrast administration. Multiplanar CT image reconstructions were also generated. RADIATION DOSE REDUCTION: This exam was performed according to the departmental dose-optimization program which includes automated exposure  control, adjustment of the mA and/or kV according to patient size and/or use of iterative reconstruction technique. COMPARISON:  MRI lumbar spine dated 10/22/2022 FINDINGS: Segmentation: 5 lumbar type vertebral bodies. Alignment: Normal lumbar lordosis. Vertebrae: No acute fracture or focal pathologic process. Paraspinal and other soft tissues: Left renal cysts incompletely visualized and poorly evaluated. 3 mm nonobstructing left upper pole renal calculus. Atherosclerotic calcifications of the abdominal aorta and branch vessels. Disc levels: Mild to moderate degenerative changes, most prominent at L1-2. Spinal canal is patent. IMPRESSION: No traumatic injury to the lumbar spine. Mild to moderate degenerative changes, as above. Electronically Signed   By: Charline Bills M.D.   On: 12/09/2022 00:46       IMPRESSION/PLAN: 1. 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 Most recent imaging indicates spinal metastases in the T-spine. Patient is experiencing significant pain consistent with area of disease on imaging. Dr. Mitzi Hansen agrees he is a good candidate for palliative radiotherapy to reduce his pain and decrease his risk of further disease spread. Accordingly, Dr. Mitzi Hansen recommends 30 Gy in 10 fractions to the visualized disease in the T-spine. We discussed the available radiation techniques, and focused on the details and logistics of delivery. We discussed and outlined the risks, benefits, short and long-term effects associated with radiotherapy. He was encouraged to ask questions that were answered to his stated satisfaction.   He is scheduled for CT simulation on 01/02/2023. We will review his consent form at this appointment. We look forward to participating in this patient's care.   He was educated on signs/symptoms of spinal cord compression. He understands to let his  nursing staff know immediately if he experiences any of these.   In a visit lasting 60 minutes, greater than 50% of the time was spent face to face discussing the patient's condition, in preparation for the discussion, and coordinating the patient's care.   The above documentation reflects my direct findings during this shared patient visit. Please see the separate note by Dr. Mitzi Hansen on this date for the remainder of the patient's plan of care.    Joyice Faster, PA-C    **Disclaimer: This note was dictated with voice recognition software. Similar sounding words can inadvertently be transcribed and this note may contain transcription errors which may not have been corrected upon publication of note.**

## 2022-12-29 NOTE — Progress Notes (Signed)
ANTICOAGULATION CONSULT NOTE - Follow Up Consult  Pharmacy Consult for heparin Indication: acute pulmonary embolus  No Known Allergies  Patient Measurements: Height: 5\' 11"  (180.3 cm) Weight: 77 kg (169 lb 12.1 oz) IBW/kg (Calculated) : 75.3 Heparin Dosing Weight: 77 kg  Vital Signs: Temp: 98.6 F (37 C) (08/30 1225) Temp Source: Oral (08/30 1225) BP: 119/96 (08/30 1225) Pulse Rate: 95 (08/30 1225)  Labs: Recent Labs    12/27/22 1122 12/27/22 1500 12/27/22 1705 12/28/22 0045 12/28/22 0833 12/28/22 1813 12/29/22 0511 12/29/22 1302  HGB 15.8  --   --  13.6  --   --  12.6*  --   HCT 48.7  --   --  40.7  --   --  38.1*  --   PLT 171  --   --  134*  --   --  149*  --   APTT  --   --  29 125*  --   --   --   --   LABPROT  --   --  15.2 15.1  --   --   --   --   INR  --   --  1.2 1.2  --   --   --   --   HEPARINUNFRC  --   --   --  0.57   < > 0.69 0.50 0.50  CREATININE 1.69*  --   --  1.10  --   --  0.88  --   TROPONINIHS 13 17  --   --   --   --   --   --    < > = values in this interval not displayed.    Estimated Creatinine Clearance: 83.2 mL/min (by C-G formula based on SCr of 0.88 mg/dL).   Assessment: Patient is a 72 y.o M with metastatic NSCL cancer who presented to the ED on 12/27/22 from his oncologist office after he was found to be tachycardic and hypotensive. Chest CT on 12/27/22 showed an acute small LUL PE with no RHS.  He's currently on heparin drip for acute VTE treatment.  Today, 12/29/2022: - at 1302 heparin level = 0.5, therapeutic on IV heparin at 1150 units/hr    - hgb 12.6, plts 149K  -- both low - no bleeding documented  Goal of Therapy:  Heparin level 0.3-0.7 units/ml Monitor platelets by anticoagulation protocol: Yes   Plan:  - continue heparin drip @ 1150 units/hr - Check daily heparin level and CBC   - monitor for s/sx bleeding   Tacy Learn, PharmD Clinical Pharmacist 12/29/2022 2:02 PM

## 2022-12-29 NOTE — Progress Notes (Signed)
DIAGNOSIS: Metastatic lung cancer, initially diagnosed as stage IIIb (T0 N2, M0) non-small cell lung cancer, adenocarcinoma presented with right hilar and mediastinal lymphadenopathy with no clear primary diagnosed in February 2024.   Molecular studies by [id number] that showed positive KRAS G12C mutation and PD-L1 expression of 53%.   PRIOR THERAPY: Concurrent chemoradiation with weekly carboplatin for AUC of 2 and paclitaxel 45 Mg/M2.  Last dose on 08/21/2022 status post 5 cycles.   Subjective: The patient is seen and examined today.  He is feeling fine with no concerning complaints except for the mild shortness of breath.  He has no current chest pain has mild cough with no hemoptysis.  He continues to complain of back pain.  He was seen by orthopedic surgery in the past but they recommended against surgical intervention.  He was seen in the office few days ago for evaluation and discussion of treatment options after his disease progression but the patient was in SVT and he was admitted to the hospital for management of his condition.  CT angiogram of the chest showed small left upper lobe segmental pulmonary embolus new since the prior exam with progressive right hilar mass with extrinsic compression of the right pulmonary artery consistent with worsening malignancy as well as progressive consolidation within the right perihilar region greatest in the right lower lobe compatible with radiation pneumonitis.  He also has evidence of metastatic disease with diffuse bony metastasis in addition to mediastinal adenopathy and multiple liver lesions as well as left adrenal mass.  He has no current fever or chills.  He has no nausea, vomiting, diarrhea or constipation.  Objective: Vital signs in last 24 hours: Temp:  [98.3 F (36.8 C)-98.4 F (36.9 C)] 98.3 F (36.8 C) (08/30 9811) Pulse Rate:  [81-98] 93 (08/30 0936) BP: (135-161)/(97-119) 155/97 (08/30 0933) SpO2:  [92 %-97 %] 92 % (08/30  0633)  Intake/Output from previous day: 08/29 0701 - 08/30 0700 In: 1503.7 [I.V.:1503.7] Out: 650 [Urine:650] Intake/Output this shift: No intake/output data recorded.  General appearance: alert, cooperative, fatigued, and no distress Resp: clear to auscultation bilaterally Cardio: regular rate and rhythm, S1, S2 normal, no murmur, click, rub or gallop GI: soft, non-tender; bowel sounds normal; no masses,  no organomegaly Extremities: extremities normal, atraumatic, no cyanosis or edema  Lab Results:  Recent Labs    12/28/22 0045 12/29/22 0511  WBC 8.8 8.2  HGB 13.6 12.6*  HCT 40.7 38.1*  PLT 134* 149*   BMET Recent Labs    12/28/22 0045 12/29/22 0511  NA 137 140  K 4.2 3.9  CL 106 108  CO2 22 25  GLUCOSE 110* 113*  BUN 30* 22  CREATININE 1.10 0.88  CALCIUM 10.9* 11.2*    Studies/Results: ECHOCARDIOGRAM COMPLETE  Result Date: 12/28/2022    ECHOCARDIOGRAM REPORT   Patient Name:   GUSTAVE MCFAUL Date of Exam: 12/28/2022 Medical Rec #:  [id number]    Height:       71.0 in Accession #:    [id number]   Weight:       169.8 lb Date of Birth:  [birth year]    BSA:          1.967 m Patient Age:    71 years     BP:           124/94 mmHg Patient Gender: M            HR:           100 bpm. Exam Location:  Inpatient Procedure: 2D Echo, Cardiac Doppler and Color Doppler Indications:    Pulmonary embolus  History:        Patient has prior history of Echocardiogram examinations, most                 recent 03/13/2022. Stroke; Risk Factors:Dyslipidemia,                 Hypertension and Diabetes. Lung CA, hx of radiation therapy.  Sonographer:    Milda Smart Referring Phys: [id number] [facility] GOEL  Sonographer Comments: Suboptimal parasternal window. Image acquisition challenging due to patient body habitus and Image acquisition challenging due to respiratory motion. IMPRESSIONS  1. Left ventricular ejection fraction, by estimation, is 60 to 65%. The left ventricle has normal function. The left  ventricle has no regional wall motion abnormalities. There is mild left ventricular hypertrophy. Left ventricular diastolic parameters are indeterminate.  2. Right ventricular systolic function is normal. The right ventricular size is mildly enlarged. There is normal pulmonary artery systolic pressure. The estimated right ventricular systolic pressure is 19.3 mmHg.  3. The mitral valve is grossly normal. Trivial mitral valve regurgitation. No evidence of mitral stenosis.  4. The aortic valve is tricuspid. There is mild calcification of the aortic valve. There is mild thickening of the aortic valve. Aortic valve regurgitation is not visualized. No aortic stenosis is present.  5. The inferior vena cava is normal in size with greater than 50% respiratory variability, suggesting right atrial pressure of 3 mmHg. FINDINGS  Left Ventricle: Left ventricular ejection fraction, by estimation, is 60 to 65%. The left ventricle has normal function. The left ventricle has no regional wall motion abnormalities. The left ventricular internal cavity size was normal in size. There is  mild left ventricular hypertrophy. Left ventricular diastolic parameters are indeterminate. Right Ventricle: The right ventricular size is mildly enlarged. No increase in right ventricular wall thickness. Right ventricular systolic function is normal. There is normal pulmonary artery systolic pressure. The tricuspid regurgitant velocity is 2.02  m/s, and with an assumed right atrial pressure of 3 mmHg, the estimated right ventricular systolic pressure is 19.3 mmHg. Left Atrium: Left atrial size was normal in size. Right Atrium: Right atrial size was normal in size. Pericardium: There is no evidence of pericardial effusion. Mitral Valve: The mitral valve is grossly normal. Trivial mitral valve regurgitation. No evidence of mitral valve stenosis. Tricuspid Valve: The tricuspid valve is normal in structure. Tricuspid valve regurgitation is mild . No  evidence of tricuspid stenosis. Aortic Valve: The aortic valve is tricuspid. There is mild calcification of the aortic valve. There is mild thickening of the aortic valve. Aortic valve regurgitation is not visualized. No aortic stenosis is present. Pulmonic Valve: The pulmonic valve was normal in structure. Pulmonic valve regurgitation is not visualized. No evidence of pulmonic stenosis. Aorta: The aortic root is normal in size and structure. Venous: The inferior vena cava is normal in size with greater than 50% respiratory variability, suggesting right atrial pressure of 3 mmHg. IAS/Shunts: No atrial level shunt detected by color flow Doppler.  LEFT VENTRICLE PLAX 2D LVIDd:         3.90 cm     Diastology LVIDs:         3.10 cm     LV e' medial:  6.09 cm/s LV PW:         1.10 cm     LV e' lateral: 8.59 cm/s LV IVS:        1.10  cm  LV Volumes (MOD) LV vol d, MOD A2C: 40.5 ml LV vol d, MOD A4C: 59.8 ml LV vol s, MOD A2C: 13.3 ml LV vol s, MOD A4C: 17.7 ml LV SV MOD A2C:     27.2 ml LV SV MOD A4C:     59.8 ml LV SV MOD BP:      34.2 ml RIGHT VENTRICLE             IVC RV S prime:     12.10 cm/s  IVC diam: 1.90 cm TAPSE (M-mode): 2.0 cm LEFT ATRIUM             Index LA diam:        3.20 cm 1.63 cm/m LA Vol (A2C):   22.9 ml 11.64 ml/m LA Vol (A4C):   29.8 ml 15.15 ml/m LA Biplane Vol: 27.1 ml 13.78 ml/m  AORTIC VALVE LVOT Vmax:   93.30 cm/s LVOT Vmean:  56.100 cm/s LVOT VTI:    0.126 m  AORTA Ao Root diam: 3.80 cm MR Peak grad: 36.0 mmHg   TRICUSPID VALVE MR Vmax:      300.00 cm/s TR Peak grad:   16.3 mmHg                           TR Vmax:        202.00 cm/s                            SHUNTS                           Systemic VTI: 0.13 m Weston Brass MD Electronically signed by Weston Brass MD Signature Date/Time: 12/28/2022/1:57:25 PM    Final    CT Angio Chest PE W and/or Wo Contrast  Result Date: 12/27/2022 CLINICAL DATA:  Tachypnea, tachycardia, generalized weakness, history of non-small cell lung cancer  EXAM: CT ANGIOGRAPHY CHEST WITH CONTRAST TECHNIQUE: Multidetector CT imaging of the chest was performed using the standard protocol during bolus administration of intravenous contrast. Multiplanar CT image reconstructions and MIPs were obtained to evaluate the vascular anatomy. RADIATION DOSE REDUCTION: This exam was performed according to the departmental dose-optimization program which includes automated exposure control, adjustment of the mA and/or kV according to patient size and/or use of iterative reconstruction technique. CONTRAST:  75mL OMNIPAQUE IOHEXOL 350 MG/ML SOLN COMPARISON:  12/09/2022, 12/27/2022 FINDINGS: Cardiovascular: This is a technically adequate evaluation of the pulmonary vasculature. There is a small nonocclusive left upper lobe segmental pulmonary embolus, reference image 57/4, new since prior study. Clot burden is minimal. No other filling defects. There is extrinsic narrowing of the right main pulmonary artery and right middle lobe pulmonary artery due to increasing soft tissue density at the right hilum, worrisome for progression of disease. The heart is unremarkable without pericardial effusion. 4.5 cm ascending thoracic aortic aneurysm is identified. Evaluation of the aortic lumen is limited due to timing of contrast bolus. Stable atherosclerosis of the aorta and coronary vasculature. Mediastinum/Nodes: Thyroid, trachea, and esophagus are unremarkable. Subcarinal adenopathy measures up to 15 mm in short axis, unchanged since prior exam. Increased soft tissue density at the right hilum measures 1.8 x 3.6 cm reference image 72/4, previously having measured 2.0 x 1.7 cm. This results and increasing extrinsic compression upon the right main pulmonary artery and right middle lobe pulmonary artery. Lungs/Pleura: Upper lobe predominant emphysema  again noted. Right perihilar lung consolidation is again noted, slightly more pronounced in the right middle and right lower lobe since prior study,  likely sequela of radiation pneumonitis. Trace right pleural effusion is noted. There is some minimal tree in bud nodular airspace disease within the left upper lobe, unchanged since prior exam, likely sequela of inflammation or infection. No pneumothorax. Central airways are patent. Upper Abdomen: Metastatic lesions within the liver are again identified, measuring up to 4.2 cm in the left lobe image 121/4 and 2.5 cm in the right lobe image 168/4. These lesions are more difficult to appreciate due to timing of contrast bolus. Stable left adrenal mass consistent with metastatic disease. 3.6 cm infrarenal abdominal aortic aneurysm again noted. Musculoskeletal: Large lytic lesions are seen within the left scapula, left third rib, right lateral fifth rib, inferior margin of the right scapula, T10, and T12, consistent with metastatic disease. There are other smaller lucency seen throughout the thoracic cage consistent with additional metastatic foci, also stable. Review of the MIP images confirms the above findings. IMPRESSION: 1. Small left upper lobe segmental pulmonary embolus, new since prior study. Minimal clot burden without right heart strain. 2. Progressive right hilar mass, with extrinsic compression of the right pulmonary artery, consistent with worsening malignancy. 3. Progressive consolidation within the right perihilar region, greatest in the right lower lobe, compatible with radiation pneumonitis. 4. Continued findings of metastatic disease, with diffuse bony metastases, mediastinal adenopathy, multiple liver lesions, and left adrenal mass as above. These are not appreciably changed since prior staging CT performed 12/09/2022. 5. 4.6 cm ascending thoracic aortic aneurysm. Ascending thoracic aortic aneurysm. Recommend semi-annual imaging followup by CTA or MRA and referral to cardiothoracic surgery if not already obtained. This recommendation follows 2010 ACCF/AHA/AATS/ACR/ASA/SCA/SCAI/SIR/STS/SVM Guidelines  for the Diagnosis and Management of Patients With Thoracic Aortic Disease. Circulation. 2010; 121: P295-J884. Aortic aneurysm NOS (ICD10-I71.9) 6. Abdominal aortic aneurysm measuring 3.6 cm infrarenal. Recommend follow-up every 2 years. Reference: J Am Coll Radiol 2013;10:789-794. Critical Value/emergent results were called by telephone at the time of interpretation on 12/27/2022 at 4:10 pm to provider [facility] , who verbally acknowledged these results. Electronically Signed   By: Sharlet Salina M.D.   On: 12/27/2022 16:10   DG Chest Portable 1 View  Result Date: 12/27/2022 CLINICAL DATA:  Tachypnea, tachycardia, and generalized weakness EXAM: PORTABLE CHEST 1 VIEW COMPARISON:  CTA chest dated 12/09/2022, chest radiograph dated 06/12/2022 FINDINGS: Asymmetric low right lung volumes with irregular right hilar opacity, likely reflecting radiation changes. No pleural effusion or pneumothorax. The heart size and mediastinal contours are within normal limits. No acute osseous abnormality. IMPRESSION: Asymmetric low right lung volumes with irregular right hilar opacity, likely reflecting radiation changes. Electronically Signed   By: Agustin Cree M.D.   On: 12/27/2022 11:50    Medications: I have reviewed the patient's current medications.  Assessment/Plan: This is a very pleasant 72 years old African-American male with metastatic non-small cell lung cancer, adenocarcinoma diagnosed in February 2024 with positive KRAS G12C mutation and PD-L1 expression of 53%.  The patient underwent a course of concurrent chemoradiation with weekly carboplatin and paclitaxel with partial response but unfortunately he was lost to follow-up after completion of the course of concurrent chemoradiation which was completed in April 2024. He was seen recently in the office for reevaluation and discussion of his treatment options but he was admitted to the hospital with SVT which is significantly improved and he is currently on  metoprolol. His repeat imaging studies  showed evidence of metastatic disease including the lung, liver and bone. I had a lengthy discussion with the patient today about his current condition and treatment options.  I discussed with the patient the option of palliative care and hospice referral versus consideration of treatment with a combination of chemoimmunotherapy especially with the elevated PD-L1 expression or treatment with targeted therapy for the positive KRAS G12C mutation. The patient is interested in additional treatment and has no intention to consider palliative care and hospice at this point. I will arrange for him a follow-up appointment after discharge for evaluation of his condition and discussion of the treatment options in full details. Regarding the back pain this is likely secondary to metastatic disease in the bone and he may benefit from consultation with radiation oncology for palliative radiotherapy to these areas.  Continue his current pain medication.  He may also need to consult the palliative care team for management of his pain issues during hospitalization and also after discharge. For the SVT, he will need to continue his current treatment with amlodipine and metoprolol with follow-up visit with cardiology. For the recently diagnosed with a small pulmonary embolism, he is currently on heparin drip and switch him to Eliquis on discharge. Thank you for taking good care of Mr. Tomson, I will arrange for the patient a follow-up appointment with me after discharge for more discussion of his condition and treatment options.  Please call if you have any questions.  LOS: 2 days    Lajuana Matte 12/29/2022

## 2022-12-29 NOTE — Assessment & Plan Note (Signed)
Patient reports inability to eat - no n/v/d, just does not want food Prior diarrhea with Ensure Will request nutrition consult, as he appears to have severe malnutrition Consider appetite stimulant but this is suboptimal with current VTE Will await recommendations from palliative care

## 2022-12-29 NOTE — Assessment & Plan Note (Signed)
-   Replete as needed 

## 2022-12-29 NOTE — Plan of Care (Signed)
Monitor labs and vital signs. Reinforce education. Medicate as needed for constipation. Continue current plan of care.

## 2022-12-29 NOTE — Progress Notes (Signed)
Pt without any diarrhea since admission. Okay with provider to d/c enteric precautions at this time. Per pt he only has diarrhea when he drinks ensure.Marland Kitchen awaiting IV order clairfication

## 2022-12-29 NOTE — Progress Notes (Signed)
ANTICOAGULATION CONSULT NOTE - Follow Up Consult  Pharmacy Consult for heparin Indication: acute pulmonary embolus  No Known Allergies  Patient Measurements: Height: 5\' 11"  (180.3 cm) Weight: 77 kg (169 lb 12.1 oz) IBW/kg (Calculated) : 75.3 Heparin Dosing Weight: 77 kg  Vital Signs: Temp: 98.4 F (36.9 C) (08/29 2211) Temp Source: Oral (08/29 2211) BP: 135/109 (08/29 2211) Pulse Rate: 98 (08/29 2211)  Labs: Recent Labs    12/27/22 1122 12/27/22 1122 12/27/22 1500 12/27/22 1705 12/28/22 0045 12/28/22 0833 12/28/22 1813 12/29/22 0511  HGB 15.8  --   --   --  13.6  --   --  12.6*  HCT 48.7  --   --   --  40.7  --   --  38.1*  PLT 171  --   --   --  134*  --   --  149*  APTT  --   --   --  29 125*  --   --   --   LABPROT  --   --   --  15.2 15.1  --   --   --   INR  --   --   --  1.2 1.2  --   --   --   HEPARINUNFRC  --    < >  --   --  0.57 0.81* 0.69 0.50  CREATININE 1.69*  --   --   --  1.10  --   --  0.88  TROPONINIHS 13  --  17  --   --   --   --   --    < > = values in this interval not displayed.    Estimated Creatinine Clearance: 83.2 mL/min (by C-G formula based on SCr of 0.88 mg/dL).   Assessment: Patient is a 72 y.o M with metastatic NSCL cancer who presented to the ED on 12/27/22 from his oncologist office after he was found to be tachycardic and hypotensive. Chest CT on 12/27/22 showed an acute small LUL PE with no RHS.  He's currently on heparin drip for acute VTE treatment.  Today, 12/29/2022: - heparin level = 0.5 (therapeutic) following heparin rate reduction to 1150 units/hr    - hgb 12.6, plts 149K - no bleeding documented   Goal of Therapy:  Heparin level 0.3-0.7 units/ml Monitor platelets by anticoagulation protocol: Yes   Plan:  - continue heparin drip @ 1150 units/hr - recheck heparin level in 8 hr to confirm therapeutic dose   - monitor for s/sx bleeding   Terrilee Files, PharmD 12/29/2022 6:13 AM

## 2022-12-29 NOTE — Progress Notes (Addendum)
Progress Note   Patient: Gavin Dennis DVV:[id number] DOB: [birth year] DOA: 12/27/2022     2 DOS: the patient was seen and examined on 12/29/2022   Brief hospital course: 72yo male with h/o lung cancer undergoing treatment and chronic back pain who presented to the cancer center for routine appointment and was found to have tachycardia to 160.  He was sent to the ER and evaluation showed acute PE.  He was started on heparin infusion.  Echo ordered.  Improved HR (thought to be from SVT) with IVF.  Marginal BPs, BP medications held.  Also reported 3 weeks of diarrhea so C diff was ordered; however, patient reported no further diarrhea in the hospital and thinks this was related to Ensure.  Oncology consulted, will see as an outpatient.  He remains focused on aggressive treatment but will consult palliative care for assistance with pain management and GOC.  Also with diffuse MSK lesions so will consult rad onc for possible radiation to relieve pain.  Assessment and Plan: * Pulmonary embolism (HCC) Presumably acute given association with tachycardia, soft blood pressure in the setting of known malignancy Started on heparin infusion Responded well to fluids in terms of blood pressure and tachycardia Patient is not showing evidence of hemodynamic instability at this time On room air PESI score on admission was Class V, very high risk, 10.0-24.5% 30-day mortality in this group S-PESI score on admission was also high, indicating that the patient has a 8.9% risk of death  He may have been a candidate for intervention but is currently stable and much better in appearance and so does not appear to need intervention With such a small subsegmental PE, his presentation may be related to other considerations with PE being an incidental finding Admitted on telemetry  R heart strain was not seen on CT Initiate anticoagulation - for now, will start treatment-dose heparin with plan to transition to Eliquis at time of  dc per oncology [place] O2 as needed - none currently Patients with active cancer are at high risk (>8%/year) for recurrent VTE and should be maintained on indefinite AC.  Anorexia Patient reports inability to eat - no n/v/d, just does not want food Prior diarrhea with Ensure Will request nutrition consult, as he appears to have severe malnutrition Consider appetite stimulant but this is suboptimal with current VTE Will await recommendations from palliative care  SVT (supraventricular tachycardia) Extreme sinus tachycardia versus PSVT on presentation Patient has responded to fluids Stable now, will dc telemetry   AKI (acute kidney injury) (HCC) Associated with high anion gap acidosis This is likely multifactorial/prerenal due to low blood pressure from diarrhea as well as possibly from pulmonary embolism Resolved with hydration  Diarrhea X 3 weeks per patient C diff ordered Patient reports this is related to Ensure Hold Ensure and follow No longer on precautions  Malignant neoplasm of unspecified part of unspecified bronchus or lung (HCC) Appears to have been lost to follow up since last appointment in 07/2022 Stage IIIb (T0 N2, M0) non-small cell lung cancer, adenocarcinoma presented with right hilar and mediastinal lymphadenopathy with no clear primary diagnosed in February 2024 CTA with progressive disease and diffuse mets that were also noted on CT on 12/09/2022 Dr. Arbutus Ped was consulted and saw patient on 8/30 He was offered palliative care/hospice but declined and wants to pursue chemoimmunotherapy He will f/u with oncology as an outpatient He has diffuse bony mets and Dr. Arbutus Ped also recommended consulting Rad Onc for possible radiation to help with  his back pain; Dr. Mitzi Hansen was consulted  Type 2 diabetes mellitus with other specified complication (HCC) Last A1c was 6.7, good control Hold Glucophage Cover with sensitive-scale SSI   Hypomagnesemia Replete as  needed  Essential hypertension Continue amlodipine and Toprol XL - both were held on presentation due to hypotension but BP has uptrended and is now high Will add prn IV hydralazine  Aneurysm of ascending aorta without rupture (HCC) 4.6 thoracic aneurysm, needs semi-annual imaging 3.6 infrarenal aneurysm, f/u q2 years  History of stroke Hold Plavix while on Heparin drip, likely ok to dc since he will need AC lifelong Continue Lipitor           Consultants: Oncology Radiation Oncology Palliative care Nutrition   Procedures: None   Antibiotics: None  30 Day Unplanned Readmission Risk Score    Flowsheet Row ED to Hosp-Admission (Current) from 12/27/2022 in [facility] 4TH FLOOR PROGRESSIVE CARE AND UROLOGY  30 Day Unplanned Readmission Risk Score (%) 18.84 Filed at 12/29/2022 0801       This score is the patient's risk of an unplanned readmission within 30 days of being discharged (0 -100%). The score is based on dignosis, age, lab data, medications, orders, and past utilization.   Low:  0-14.9   Medium: 15-21.9   High: 22-29.9   Extreme: 30 and above           Subjective: Continuing to have severe back pain, also not eating anything.  No further diarrhea since he hasn't had any Ensure.   Objective: Vitals:   12/29/22 0936 12/29/22 1225  BP:  (!) 119/96  Pulse: 93 95  Resp:    Temp:  98.6 F (37 C)  SpO2:  91%    Intake/Output Summary (Last 24 hours) at 12/29/2022 1815 Last data filed at 12/29/2022 1749 Gross per 24 hour  Intake 1370.38 ml  Output 1150 ml  Net 220.38 ml   Filed Weights   12/27/22 1124  Weight: 77 kg    Exam:  General:  Appears calm and comfortable and is in NAD, mildly disheveled, frail Eyes:   normal lids ENT:  grossly normal hearing, lips & tongue, mmm Neck:  no LAD, masses or thyromegaly Cardiovascular:  RRR, no m/r/g. No LE edema.  Respiratory:   CTA bilaterally with no wheezes/rales/rhonchi.  Normal respiratory  effort. Abdomen:  soft, NT, ND Skin:  no rash or induration seen on limited exam Musculoskeletal:  grossly normal tone BUE/BLE, good ROM, no bony abnormality Psychiatric:  blunted mood and affect, speech fluent and appropriate, AOx3 Neurologic:  CN 2-12 grossly intact, moves all extremities in coordinated fashion  Data Reviewed: I have reviewed the patient's lab results since admission.  Pertinent labs for today include:   Glucose 113 Calcium 11.2 WBC 8.2 Hgb 12.6     Family Communication: None present; I spoke with his daughter by telephone  Disposition: Status is: Inpatient Remains inpatient appropriate because: ongoing evaluation and treatment     Time spent: 50 minutes  Unresulted Labs (From admission, onward)     Start     Ordered   12/30/22 0500  Heparin level (unfractionated)  Daily,   R      12/28/22 2041   12/30/22 0500  Basic metabolic panel  Tomorrow morning,   R        12/29/22 1442   12/28/22 0000  CBC  Daily,   R      12/27/22 1636   12/27/22 1722  Culture, blood (Routine X  2) w Reflex to ID Panel  BLOOD CULTURE X 2,   R (with STAT occurrences)      12/27/22 1721   12/27/22 1722  Gastrointestinal Panel by PCR , Stool  (Gastrointestinal Panel by PCR, Stool                                                                                                                                                     **Does Not include CLOSTRIDIUM DIFFICILE testing. **If CDIFF testing is needed, place order from the "C Difficile Testing" order set.**)  Once,   URGENT        12/27/22 1721             Author: Jonah Blue, MD 12/29/2022 6:15 PM  For on call review [email or web address].

## 2022-12-30 DIAGNOSIS — Z515 Encounter for palliative care: Secondary | ICD-10-CM | POA: Diagnosis not present

## 2022-12-30 DIAGNOSIS — C3490 Malignant neoplasm of unspecified part of unspecified bronchus or lung: Secondary | ICD-10-CM | POA: Diagnosis not present

## 2022-12-30 DIAGNOSIS — I2699 Other pulmonary embolism without acute cor pulmonale: Secondary | ICD-10-CM | POA: Diagnosis not present

## 2022-12-30 DIAGNOSIS — C7951 Secondary malignant neoplasm of bone: Secondary | ICD-10-CM | POA: Diagnosis not present

## 2022-12-30 DIAGNOSIS — Z7189 Other specified counseling: Secondary | ICD-10-CM

## 2022-12-30 LAB — GLUCOSE, CAPILLARY
Glucose-Capillary: 79 mg/dL (ref 70–99)
Glucose-Capillary: 118 mg/dL — ABNORMAL HIGH (ref 70–99)
Glucose-Capillary: 109 mg/dL — ABNORMAL HIGH (ref 70–99)
Glucose-Capillary: 186 mg/dL — ABNORMAL HIGH (ref 70–99)

## 2022-12-30 LAB — CBC
HCT: 36.6 % — ABNORMAL LOW (ref 39.0–52.0)
Hemoglobin: 12.2 g/dL — ABNORMAL LOW (ref 13.0–17.0)
MCH: 32.9 pg (ref 26.0–34.0)
MCHC: 33.3 g/dL (ref 30.0–36.0)
MCV: 98.7 fL (ref 80.0–100.0)
Platelets: 144 10*3/uL — ABNORMAL LOW (ref 150–400)
RBC: 3.71 MIL/uL — ABNORMAL LOW (ref 4.22–5.81)
RDW: 14.4 % (ref 11.5–15.5)
WBC: 8.3 10*3/uL (ref 4.0–10.5)
nRBC: 0 % (ref 0.0–0.2)

## 2022-12-30 LAB — BASIC METABOLIC PANEL
Anion gap: 10 (ref 5–15)
BUN: 16 mg/dL (ref 8–23)
CO2: 24 mmol/L (ref 22–32)
Calcium: 11 mg/dL — ABNORMAL HIGH (ref 8.9–10.3)
Chloride: 104 mmol/L (ref 98–111)
Creatinine, Ser: 0.76 mg/dL (ref 0.61–1.24)
GFR, Estimated: >60 mL/min (ref >60)
Glucose, Bld: 94 mg/dL (ref 70–99)
Potassium: 3.7 mmol/L (ref 3.5–5.1)
Sodium: 138 mmol/L (ref 135–145)

## 2022-12-30 LAB — HEPARIN LEVEL (UNFRACTIONATED): Heparin Unfractionated: 0.18 [IU]/mL — ABNORMAL LOW (ref 0.30–0.70)

## 2022-12-30 MED ORDER — LIDOCAINE 5 % EX PTCH
1.0000 | MEDICATED_PATCH | CUTANEOUS | Status: DC
  Administered 2022-12-30 – 2023-01-08 (×10): 1 via TRANSDERMAL
  Filled 2022-12-30 (×10): qty 1

## 2022-12-30 MED ORDER — APIXABAN 5 MG PO TABS
5.0000 mg | ORAL_TABLET | Freq: Two times a day (BID) | ORAL | Status: DC
  Administered 2023-01-06 – 2023-01-08 (×6): 5 mg via ORAL
  Filled 2022-12-30 (×6): qty 1

## 2022-12-30 MED ORDER — DEXAMETHASONE 4 MG PO TABS
4.0000 mg | ORAL_TABLET | Freq: Every day | ORAL | Status: AC
  Administered 2022-12-30 – 2023-01-03 (×5): 4 mg via ORAL
  Filled 2022-12-30 (×5): qty 1

## 2022-12-30 MED ORDER — APIXABAN 5 MG PO TABS: 10.0000 mg | ORAL_TABLET | Freq: Two times a day (BID) | ORAL | Status: DC

## 2022-12-30 MED ORDER — HEPARIN BOLUS VIA INFUSION
2000.0000 [IU] | Freq: Once | INTRAVENOUS | Status: AC
  Administered 2022-12-30: 2000 [IU] via INTRAVENOUS
  Filled 2022-12-30: qty 2000

## 2022-12-30 MED ORDER — ZOLEDRONIC ACID 4 MG/100ML IV SOLN: 4.0000 mg | Freq: Once | INTRAVENOUS | Status: DC

## 2022-12-30 MED ORDER — ONDANSETRON HCL 4 MG/2ML IJ SOLN
4.0000 mg | Freq: Four times a day (QID) | INTRAMUSCULAR | Status: DC | PRN
  Administered 2022-12-30: 4 mg via INTRAVENOUS
  Filled 2022-12-30: qty 2

## 2022-12-30 MED ORDER — ACETAMINOPHEN 500 MG PO TABS
1000.0000 mg | ORAL_TABLET | Freq: Three times a day (TID) | ORAL | Status: DC
  Administered 2022-12-30 – 2023-01-08 (×27): 1000 mg via ORAL
  Filled 2022-12-30 (×27): qty 2

## 2022-12-30 MED ORDER — APIXABAN 5 MG PO TABS
10.0000 mg | ORAL_TABLET | Freq: Two times a day (BID) | ORAL | Status: AC
  Administered 2022-12-30 – 2023-01-05 (×14): 10 mg via ORAL
  Filled 2022-12-30 (×14): qty 2

## 2022-12-30 MED ORDER — APIXABAN 5 MG PO TABS: 5.0000 mg | ORAL_TABLET | Freq: Two times a day (BID) | ORAL | Status: DC

## 2022-12-30 NOTE — Discharge Instructions (Addendum)
Information on my medicine - ELIQUIS (apixaban)  Why was Eliquis prescribed for you? Eliquis was prescribed to treat blood clots that may have been found in the veins of your legs (deep vein thrombosis) or in your lungs (pulmonary embolism) and to reduce the risk of them occurring again.  What do You need to know about Eliquis ? The starting dose is 10 mg (two 5 mg tablets) taken TWICE daily for the FIRST SEVEN (7) DAYS, then on 01/06/23  the dose is reduced to ONE 5 mg tablet taken TWICE daily.  Eliquis may be taken with or without food.   Try to take the dose about the same time in the morning and in the evening. If you have difficulty swallowing the tablet whole please discuss with your pharmacist how to take the medication safely.  Take Eliquis exactly as prescribed and DO NOT stop taking Eliquis without talking to the doctor who prescribed the medication.  Stopping may increase your risk of developing a new blood clot.  Refill your prescription before you run out.  After discharge, you should have regular check-up appointments with your healthcare provider that is prescribing your Eliquis.    What do you do if you miss a dose? If a dose of ELIQUIS is not taken at the scheduled time, take it as soon as possible on the same day and twice-daily administration should be resumed. The dose should not be doubled to make up for a missed dose.  Important Safety Information A possible side effect of Eliquis is bleeding. You should call your healthcare provider right away if you experience any of the following: Bleeding from an injury or your nose that does not stop. Unusual colored urine (red or dark brown) or unusual colored stools (red or black). Unusual bruising for unknown reasons. A serious fall or if you hit your head (even if there is no bleeding).  Some medicines may interact with Eliquis and might increase your risk of bleeding or clotting while on Eliquis. To help avoid this,  consult your healthcare provider or pharmacist prior to using any new prescription or non-prescription medications, including herbals, vitamins, non-steroidal anti-inflammatory drugs (NSAIDs) and supplements.  This website has more information on Eliquis (apixaban): http://[email or web address].com/eliquis/home

## 2022-12-30 NOTE — Plan of Care (Signed)

## 2022-12-30 NOTE — TOC Progression Note (Signed)
Transition of Care [facility]) - Progression Note    Patient Details  Name: Gavin Dennis MRN: [id number] Date of Birth: May 06, 1950  Transition of [facility]) CM/SW Contact  Darleene Cleaver, [place] Phone Number: 12/30/2022, 4:05 PM  Clinical Narrative:     TOC continuing to follow patient's progress throughout discharge planning.       Expected Discharge Plan and Services                                               Social Determinants of Health (SDOH) Interventions SDOH Screenings   Food Insecurity: No Food Insecurity (12/27/2022)  Housing: Low Risk  (12/30/2022)  Recent Concern: Housing - Medium Risk (12/27/2022)  Transportation Needs: No Transportation Needs (12/27/2022)  Utilities: Not At Risk (12/27/2022)  Alcohol Screen: Low Risk  (07/29/2021)  Depression (PHQ2-9): Low Risk  (07/05/2022)  Financial Resource Strain: Low Risk  (12/30/2022)  Physical Activity: Insufficiently Active (07/29/2021)  Social Connections: Moderately Integrated (07/29/2021)  Stress: No Stress Concern Present (07/29/2021)  Tobacco Use: Medium Risk (12/27/2022)    Readmission Risk Interventions     No data to display

## 2022-12-30 NOTE — Assessment & Plan Note (Addendum)
Up to 11 despite IVF, down to 10.5 today May need Zometa Pharmacy recommends holding Zometa for now since Ca++ <12 Improving without intervention currently Recheck BMP in AM

## 2022-12-30 NOTE — Plan of Care (Signed)
  Problem: Clinical Measurements: Goal: Diagnostic test results will improve Outcome: Progressing   Problem: Activity: Goal: Risk for activity intolerance will decrease Outcome: Progressing   Problem: Nutrition: Goal: Adequate nutrition will be maintained Outcome: Progressing   Problem: Nutritional: Goal: Maintenance of adequate nutrition will improve Outcome: Progressing   Problem: Pain Managment: Goal: General experience of comfort will improve Outcome: Not Progressing   Problem: Education: Goal: Knowledge of General Education information will improve Description: Including pain rating scale, medication(s)/side effects and non-pharmacologic comfort measures Outcome: Adequate for Discharge   Problem: Clinical Measurements: Goal: Will remain free from infection Outcome: Adequate for Discharge Goal: Respiratory complications will improve Outcome: Adequate for Discharge   Problem: Coping: Goal: Level of anxiety will decrease Outcome: Adequate for Discharge   Problem: Elimination: Goal: Will not experience complications related to urinary retention Outcome: Adequate for Discharge   Problem: Safety: Goal: Ability to remain free from injury will improve Outcome: Adequate for Discharge   Problem: Skin Integrity: Goal: Risk for impaired skin integrity will decrease Outcome: Adequate for Discharge   Problem: Fluid Volume: Goal: Ability to maintain a balanced intake and output will improve Outcome: Adequate for Discharge   Problem: Skin Integrity: Goal: Risk for impaired skin integrity will decrease Outcome: Adequate for Discharge

## 2022-12-30 NOTE — Progress Notes (Addendum)
Progress Note   Patient: Gavin Dennis UJW:[id number] DOB: 10-08-1950 DOA: 12/27/2022     3 DOS: the patient was seen and examined on 12/30/2022   Brief hospital course: 72yo male with h/o lung cancer undergoing treatment and chronic back pain who presented to the cancer center for routine appointment and was found to have tachycardia to 160.  He was sent to the ER and evaluation showed acute PE.  He was started on heparin infusion.  Echo ordered.  Improved HR (thought to be from SVT) with IVF.  Marginal BPs, BP medications held.  Also reported 3 weeks of diarrhea so C diff was ordered; however, patient reported no further diarrhea in the hospital and thinks this was related to Ensure.  Oncology consulted, will see as an outpatient.  He remains focused on aggressive treatment but will consult palliative care for assistance with pain management and GOC.  Also with diffuse MSK lesions so will consult rad onc for possible radiation to relieve pain.  Plan for initiation of radiation on 9/3.  Assessment and Plan: * Pulmonary embolism (HCC) Presumably acute given association with tachycardia, soft blood pressure in the setting of known malignancy Started on heparin infusion Responded well to fluids in terms of blood pressure and tachycardia Patient is not showing evidence of hemodynamic instability at this time On room air PESI score on admission was Class V, very high risk, 71.0-24.5% 30-day mortality in this group S-PESI score on admission was also high, indicating that the patient has a 8.9% risk of death  He may have been a candidate for intervention but is currently stable and much better in appearance and so does not appear to need intervention With such a small subsegmental PE, his presentation may be related to other considerations with PE being an incidental finding Admitted on telemetry  R heart strain was not seen on CT Initiate anticoagulation - for now, will start treatment-dose heparin  with plan to transition to Eliquis - appears appropriate to transition today so will change [place] O2 as needed - none currently Patients with active cancer are at high risk (>8%/year) for recurrent VTE and should be maintained on indefinite AC.  Hypercalcemia Up to 11 despite IVF Likely needs Zometa Pharmacy recommends holding Zometa for now  Anorexia Patient reports inability to eat - no n/v/d, just does not want food Prior diarrhea with Ensure Will request nutrition consult, as he appears to have severe malnutrition Consider appetite stimulant but this is suboptimal with current VTE Will await recommendations from palliative care  SVT (supraventricular tachycardia) Extreme sinus tachycardia versus PSVT on presentation Patient has responded to fluids Stable now, will dc telemetry   AKI (acute kidney injury) (HCC) Associated with high anion gap acidosis This is likely multifactorial/prerenal due to low blood pressure from diarrhea as well as possibly from pulmonary embolism Resolved with hydration  Malignant neoplasm of unspecified part of unspecified bronchus or lung (HCC) Appears to have been lost to follow up since last appointment in 07/2022 Stage IIIb (T0 N2, M0) non-small cell lung cancer, adenocarcinoma presented with right hilar and mediastinal lymphadenopathy with no clear primary diagnosed in February 2024 CTA with progressive disease and diffuse mets that were also noted on CT on 12/09/2022 Dr. Arbutus Ped was consulted and saw patient on 8/30 He was offered palliative care/hospice but declined and wants to pursue chemoimmunotherapy He will f/u with oncology as an outpatient He has diffuse bony mets and Dr. Arbutus Ped also recommended consulting Rad Onc for possible radiation to  help with his back pain Dr. Mitzi Hansen was consulted and will start treatment on 9/3 He reports that he does have transportation and will be able to get to/from the Cancer Center once his pain is controlled and  he is able to walk  Type 2 diabetes mellitus with other specified complication (HCC) Last A1c was 6.7, good control Hold Glucophage Cover with sensitive-scale SSI   Hypomagnesemia Replete as needed  Essential hypertension Continue amlodipine and Toprol XL - both were held on presentation due to hypotension but BP has uptrended and so meds were restarted Will add prn IV hydralazine  Aneurysm of ascending aorta without rupture (HCC) 4.6 thoracic aneurysm, needs semi-annual imaging 3.6 infrarenal aneurysm, f/u q2 years  History of stroke Hold Plavix while on Heparin drip, likely ok to dc since he will need AC lifelong Continue Lipitor         Consultants: Oncology Radiation Oncology Palliative care Nutrition   Procedures: None   Antibiotics: None   30 Day Unplanned Readmission Risk Score    Flowsheet Row ED to Hosp-Admission (Current) from 12/27/2022 in [place] 4TH FLOOR PROGRESSIVE CARE AND UROLOGY  30 Day Unplanned Readmission Risk Score (%) 19.11 Filed at 12/30/2022 0800       This score is the patient's risk of an unplanned readmission within 30 days of being discharged (0 -100%). The score is based on dignosis, age, lab data, medications, orders, and past utilization.   Low:  0-14.9   Medium: 15-21.9   High: 22-29.9   Extreme: 30 and above           Subjective: Continues to have back pain, nonambulatory as a result.  No new complaints otherwise.  Ate better today.   Objective: Vitals:   12/30/22 0918 12/30/22 1230  BP: (!) 121/100 (!) 125/97  Pulse: (!) 102 (!) 103  Resp:  17  Temp:  97.9 F (36.6 C)  SpO2:  97%    Intake/Output Summary (Last 24 hours) at 12/30/2022 1651 Last data filed at 12/30/2022 1633 Gross per 24 hour  Intake 2509.22 ml  Output 500 ml  Net 2009.22 ml   Filed Weights   12/27/22 1124  Weight: 77 kg    Exam:  General:  Appears calm and comfortable and is in NAD, mildly disheveled, frail Eyes:   normal  lids ENT:  grossly normal hearing, lips & tongue, mmm Neck:  no LAD, masses or thyromegaly Cardiovascular:  RRR, no m/r/g. No LE edema.  Respiratory:   CTA bilaterally with no wheezes/rales/rhonchi.  Normal respiratory effort. Abdomen:  soft, NT, ND Skin:  no rash or induration seen on limited exam Musculoskeletal:  grossly normal tone BUE/BLE, good ROM, no bony abnormality Psychiatric:  blunted mood and affect, speech fluent and appropriate, AOx3 Neurologic:  CN 2-12 grossly intact, moves all extremities in coordinated fashion  Data Reviewed: I have reviewed the patient's lab results since admission.  Pertinent labs for today include:   Calcium 11 WBC 8.4 Hgb 12.2 Platelets 144     Family Communication: None present; discussed with his daughter yesterday  Disposition: Status is: Inpatient Remains inpatient appropriate because: ongoing evaluation/treatment     Time spent: 50 minutes  Unresulted Labs (From admission, onward)     Start     Ordered   12/31/22 0500  CBC with Differential/Platelet  Tomorrow morning,   R       Question:  Specimen collection method  Answer:  Lab=Lab collect   12/30/22 1643   12/31/22  0500  Basic metabolic panel  Tomorrow morning,   R       Question:  Specimen collection method  Answer:  Lab=Lab collect   12/30/22 1643             Author: Jonah Blue, MD 12/30/2022 4:51 PM  For on call review [email or web address].

## 2022-12-30 NOTE — Progress Notes (Signed)
ANTICOAGULATION CONSULT NOTE - Follow Up Consult  Pharmacy Consult for heparin Indication: acute pulmonary embolus  No Known Allergies  Patient Measurements: Height: 5\' 11"  (180.3 cm) Weight: 77 kg (169 lb 12.1 oz) IBW/kg (Calculated) : 75.3 Heparin Dosing Weight: 77 kg  Vital Signs: BP: 121/100 (08/31 0918) Pulse Rate: 102 (08/31 0918)  Labs: Recent Labs    12/27/22 1122 12/27/22 1500 12/27/22 1705 12/28/22 0045 12/28/22 0833 12/29/22 0511 12/29/22 1302 12/30/22 0547  HGB 15.8  --   --  13.6  --  12.6*  --  12.2*  HCT 48.7  --   --  40.7  --  38.1*  --  36.6*  PLT 171  --   --  134*  --  149*  --  144*  APTT  --   --  29 125*  --   --   --   --   LABPROT  --   --  15.2 15.1  --   --   --   --   INR  --   --  1.2 1.2  --   --   --   --   HEPARINUNFRC  --   --   --  0.57   < > 0.50 0.50 0.18*  CREATININE 1.69*  --   --  1.10  --  0.88  --  0.76  TROPONINIHS 13 17  --   --   --   --   --   --    < > = values in this interval not displayed.    Estimated Creatinine Clearance: 91.5 mL/min (by C-G formula based on SCr of 0.76 mg/dL).   Assessment: Patient is a 72 y.o M with metastatic NSCL cancer who presented to the ED on 12/27/22 from his oncologist office after he was found to be tachycardic and hypotensive. Chest CT on 12/27/22 showed an acute small LUL PE with no RHS.  He's currently on heparin drip for acute VTE treatment.  Today, 12/30/2022: - at 0547 heparin level = 0.18, subtherapeutic on heparin IV at 1150 units/hr    - hgb 12.2, plts 144K  -- both low - per RN - no interruptions in drip or signs/symptoms of bleeding  Goal of Therapy:  Heparin level 0.3-0.7 units/ml Monitor platelets by anticoagulation protocol: Yes   Plan:  - give 2000 units bolus  - increase heparin drip to 1400 units/hr - check heparin level in 6 hours - Check daily heparin level and CBC   - monitor for s/sx bleeding   Tacy Learn, PharmD Clinical Pharmacist 12/30/2022 10:28 AM

## 2022-12-30 NOTE — Progress Notes (Signed)
ANTICOAGULATION CONSULT NOTE - Follow Up Consult  Pharmacy Consult for Eliquis Indication: acute pulmonary embolus  No Known Allergies  Patient Measurements: Height: 5\' 11"  (180.3 cm) Weight: 77 kg (169 lb 12.1 oz) IBW/kg (Calculated) : 75.3 Heparin Dosing Weight: 77 kg  Vital Signs: Temp: 97.9 F (36.6 C) (08/31 1230) Temp Source: Oral (08/31 1230) BP: 125/97 (08/31 1230) Pulse Rate: 103 (08/31 1230)  Labs: Recent Labs     0000 12/27/22 1500 12/27/22 1705 12/28/22 0045 12/28/22 0833 12/29/22 0511 12/29/22 1302 12/30/22 0547  HGB   < >  --   --  13.6  --  12.6*  --  12.2*  HCT  --   --   --  40.7  --  38.1*  --  36.6*  PLT  --   --   --  134*  --  149*  --  144*  APTT  --   --  29 125*  --   --   --   --   LABPROT  --   --  15.2 15.1  --   --   --   --   INR  --   --  1.2 1.2  --   --   --   --   HEPARINUNFRC  --   --   --  0.57   < > 0.50 0.50 0.18*  CREATININE  --   --   --  1.10  --  0.88  --  0.76  TROPONINIHS  --  17  --   --   --   --   --   --    < > = values in this interval not displayed.    Estimated Creatinine Clearance: 91.5 mL/min (by C-G formula based on SCr of 0.76 mg/dL).   Assessment: Patient is a 72 y.o M with metastatic NSCL cancer who presented to the ED on 12/27/22 from his oncologist office after he was found to be tachycardic and hypotensive. Chest CT on 12/27/22 showed an acute small LUL PE with no RHS.    Pharmacy is consulted to transition IV heparin gtt to Apixaban (Eliquis)  Today, 12/30/2022: - Hgb= 12.2, low - Plt= 144, low - Scr= 0.76 - per RN - no interruptions in drip or signs/symptoms of bleeding  Goal of Therapy:   Monitor platelets by anticoagulation protocol: Yes   Plan:  - stop heparin IV - Start Eliquis 10mg  po bid for 7 days, then 5mg  po bid - Monitor Scr and CBC at least weekly - monitor for s/sx bleeding   Tacy Learn, PharmD Clinical Pharmacist 12/30/2022 12:47 PM

## 2022-12-30 NOTE — Consult Note (Signed)
Palliative Care Consult Note                                  Date: 12/30/2022   Patient Name: Gavin Dennis  DOB: [birth year]  MRN: [id number]  Age / Sex: 72 y.o., male  PCP: Ollen Bowl, MD Referring Physician: Jonah Blue, MD  Reason for Consultation: {Reason for Consult:23484}  HPI/Patient Profile: Gavin Dennis is a 72 y.o. male  with metastatic non-small cell lung cancer diagnosed in February 2024.  He underwent a course of concurrent chemoradiation with partial response, but then unfortunately was lost to follow-up.  He was recently seen in the oncology office for reevaluation and discussion of treatment options but was found to have tachycardia and was subsequently admitted to the hospital on 12/27/2022 with SVT.   Past Medical History:  Diagnosis Date   Blurred vision, left eye    since stroke 06/2020   CVA (cerebral vascular accident) (HCC) 06/2020   DDD (degenerative disc disease), lumbar    Diabetes mellitus without complication (HCC)    History of radiation therapy    Right lung- 07/17/22-08/28/22- Dr. Antony Blackbird   Hyperlipidemia    Hypertension    Hypomagnesemia    Polio    childhood   Vitamin B 12 deficiency     Subjective:   I have reviewed medical records including EPIC notes, labs and imaging, and met with patient at bedside to discuss diagnosis, prognosis, GOC, disposition, and options. He is oriented and able to participate in GOC discussion. He is forgetful of details of his care at times.   I introduced Palliative Medicine as specialized medical care for people living with serious illness. It focuses on providing relief from the symptoms and stress of a serious illness.   Created space and opportunity for patient and family to express thoughts and feelings regarding current medical situation. Values and goals of care were attempted to be elicited.  Questions and concerns addressed. Patient/family  encouraged to call with questions or concerns.     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  He has 2 sons, Floydene Flock and [place].  He also has 2 stepdaughters.   Functional Status: Patient's son Floydene Flock and daughter-in-law Laroy Apple live with patient at his house and help care for him.   Patient/Family Understanding of Illness: ***  Palliative Symptoms: ***  GOC Discussion: We discussed patient's current illness and what it means in the larger context of his ongoing co-morbidities. Current clinical status was reviewed. Natural disease trajectory of *** was discussed.  Patient and family verbalize understanding that "cancer has spread". I offered education   Review of Systems  Musculoskeletal:  Positive for back pain.    Objective:   Primary Diagnoses: Present on Admission:  Diarrhea  AKI (acute kidney injury) (HCC)  SVT (supraventricular tachycardia)  Pulmonary embolism (HCC)  Type 2 diabetes mellitus with other specified complication (HCC)  Malignant neoplasm of unspecified part of unspecified bronchus or lung (HCC)  Metastatic cancer to liver (HCC)  Aneurysm of ascending aorta without rupture (HCC)  Essential hypertension  Anorexia  Hypomagnesemia   Physical Exam  Vital Signs:  BP (!) 121/100   Pulse (!) 102   Temp 98.2 F (36.8 C) (Oral)   Resp 16   Ht 5\' 11"  (1.803 m)   Wt 77 kg   SpO2 94%   BMI 23.68 kg/m   Palliative Assessment/Data: ***  Assessment & Plan:   SUMMARY OF RECOMMENDATIONS   Full code for now with ongoing discussion  Continue current supportive interventions Referral to [facility] PMT will continue to follow  Symptom management: Dexamethasone 4 mg po daily Scheduled Tylenol 1000 mg every 8 hours Lidocaine 1% patch to mid back every 24 hours Continue oxycodone IR 5 mg every 4 hours as needed for pain (consider increasing to 5-10 mg)  Primary Decision Maker: {Primary  Decision VQQVZ:56387}  Code Status/Advance Care Planning: {Palliative Code status:23503}  Prognosis:  {Palliative Care Prognosis:23504}  Discharge Planning:  {Palliative dispostion:23505}   Discussed with: ***    Thank you for allowing [place] to participate in the care of [name]   Time Total: ***  Greater than 50%  of this time was spent counseling and coordinating care related to the above assessment and plan.  Signed by: Sherlean Foot, NP Palliative Medicine Team  Team Phone # [phone]  For individual providers, please see AMION

## 2022-12-31 DIAGNOSIS — C787 Secondary malignant neoplasm of liver and intrahepatic bile duct: Secondary | ICD-10-CM | POA: Diagnosis not present

## 2022-12-31 LAB — CBC WITH DIFFERENTIAL/PLATELET
Abs Immature Granulocytes: 0.03 10*3/uL (ref 0.00–0.07)
Basophils Absolute: 0 10*3/uL (ref 0.0–0.1)
Basophils Relative: 0 %
Eosinophils Absolute: 0 10*3/uL (ref 0.0–0.5)
Eosinophils Relative: 0 %
HCT: 36.4 % — ABNORMAL LOW (ref 39.0–52.0)
Hemoglobin: 12.1 g/dL — ABNORMAL LOW (ref 13.0–17.0)
Immature Granulocytes: 1 %
Lymphocytes Relative: 9 %
Lymphs Abs: 0.5 10*3/uL — ABNORMAL LOW (ref 0.7–4.0)
MCH: 33.1 pg (ref 26.0–34.0)
MCHC: 33.2 g/dL (ref 30.0–36.0)
MCV: 99.5 fL (ref 80.0–100.0)
Monocytes Absolute: 0.1 10*3/uL (ref 0.1–1.0)
Monocytes Relative: 2 %
Neutro Abs: 4.8 10*3/uL (ref 1.7–7.7)
Neutrophils Relative %: 88 %
Platelets: 150 10*3/uL (ref 150–400)
RBC: 3.66 MIL/uL — ABNORMAL LOW (ref 4.22–5.81)
RDW: 14.3 % (ref 11.5–15.5)
WBC: 5.4 10*3/uL (ref 4.0–10.5)
nRBC: 0 % (ref 0.0–0.2)

## 2022-12-31 LAB — BASIC METABOLIC PANEL
Anion gap: 9 (ref 5–15)
BUN: 20 mg/dL (ref 8–23)
CO2: 22 mmol/L (ref 22–32)
Calcium: 10.5 mg/dL — ABNORMAL HIGH (ref 8.9–10.3)
Chloride: 105 mmol/L (ref 98–111)
Creatinine, Ser: 0.88 mg/dL (ref 0.61–1.24)
GFR, Estimated: >60 mL/min (ref >60)
Glucose, Bld: 155 mg/dL — ABNORMAL HIGH (ref 70–99)
Potassium: 4.1 mmol/L (ref 3.5–5.1)
Sodium: 136 mmol/L (ref 135–145)

## 2022-12-31 LAB — GLUCOSE, CAPILLARY
Glucose-Capillary: 146 mg/dL — ABNORMAL HIGH (ref 70–99)
Glucose-Capillary: 143 mg/dL — ABNORMAL HIGH (ref 70–99)
Glucose-Capillary: 142 mg/dL — ABNORMAL HIGH (ref 70–99)
Glucose-Capillary: 168 mg/dL — ABNORMAL HIGH (ref 70–99)

## 2022-12-31 MED ORDER — ALUM & MAG HYDROXIDE-SIMETH 200-200-20 MG/5ML PO SUSP
30.0000 mL | ORAL | Status: DC | PRN
  Administered 2022-12-31: 30 mL via ORAL
  Filled 2022-12-31: qty 30

## 2022-12-31 MED ORDER — PANTOPRAZOLE SODIUM 40 MG PO TBEC
40.0000 mg | DELAYED_RELEASE_TABLET | Freq: Every day | ORAL | Status: DC
  Administered 2022-12-31 – 2023-01-08 (×9): 40 mg via ORAL
  Filled 2022-12-31 (×9): qty 1

## 2022-12-31 NOTE — Plan of Care (Signed)
  Problem: Nutrition: Goal: Adequate nutrition will be maintained Outcome: Progressing   Problem: Coping: Goal: Level of anxiety will decrease Outcome: Progressing   Problem: Pain Managment: Goal: General experience of comfort will improve Outcome: Progressing   

## 2022-12-31 NOTE — Plan of Care (Signed)

## 2022-12-31 NOTE — Progress Notes (Signed)
Progress Note   Patient: Gavin Dennis ZOX:[id number] DOB: 17-Jun-1950 DOA: 12/27/2022     4 DOS: the patient was seen and examined on 12/31/2022   Brief hospital course: 72yo male with h/o lung cancer undergoing treatment and chronic back pain who presented to the cancer center for routine appointment and was found to have tachycardia to 160.  He was sent to the ER and evaluation showed acute PE.  He was started on heparin infusion.  Echo ordered.  Improved HR (thought to be from SVT) with IVF.  Marginal BPs, BP medications held.  Also reported 3 weeks of diarrhea so C diff was ordered; however, patient reported no further diarrhea in the hospital and thinks this was related to Ensure.  Oncology consulted, will see as an outpatient.  He remains focused on aggressive treatment but will consult palliative care for assistance with pain management and GOC.  Also with diffuse MSK lesions so will consult rad onc for possible radiation to relieve pain.  Plan for initiation of radiation on 9/3.  Assessment and Plan: * Pulmonary embolism (HCC) Presumably acute given association with tachycardia, soft blood pressure in the setting of known malignancy Started on heparin infusion -> Eliquis on 8/31 Responded well to fluids in terms of blood pressure and tachycardia Patient is not showing evidence of hemodynamic instability at this time On room air PESI score on admission was Class V, very high risk, 10.0-24.5% 30-day mortality in this group S-PESI score on admission was also high, indicating that the patient has a 8.9% risk of death  He may have been a candidate for intervention but is currently stable and much better in appearance and so does not appear to need intervention With such a small subsegmental PE, his presentation may be related to other considerations with PE being an incidental finding Admitted on telemetry  R heart strain was not seen on CT [place] O2 as needed - none currently Patients with active  cancer are at high risk (>8%/year) for recurrent VTE and should be maintained on indefinite AC.  Hypercalcemia Up to 11 despite IVF, down to 10.5 today May need Zometa Pharmacy recommends holding Zometa for now since Ca++ <12 Improving without intervention currently  Anorexia Patient reports inability to eat - no n/v/d, just does not want food Prior diarrhea with Ensure Will request nutrition consult, as he appears to have severe malnutrition Consider appetite stimulant but this is suboptimal with current VTE Will await recommendations from palliative care  SVT (supraventricular tachycardia) Extreme sinus tachycardia versus PSVT on presentation Patient has responded to fluids Stable now, no longer on telemetry  AKI (acute kidney injury) (HCC) Associated with high anion gap acidosis This is likely multifactorial/prerenal due to low blood pressure from diarrhea as well as possibly from pulmonary embolism Resolved with hydration  Malignant neoplasm of unspecified part of unspecified bronchus or lung (HCC) Appears to have been lost to follow up since last appointment in 07/2022 Stage IIIb (T0 N2, M0) non-small cell lung cancer, adenocarcinoma presented with right hilar and mediastinal lymphadenopathy with no clear primary diagnosed in February 2024 CTA with progressive disease and diffuse mets that were also noted on CT on 12/09/2022 Dr. Arbutus Ped was consulted and saw patient on 8/30 He was offered palliative care/hospice but declined and wants to pursue chemoimmunotherapy He will f/u with oncology as an outpatient He has diffuse bony mets and Dr. Arbutus Ped also recommended consulting Rad Onc for possible radiation to help with his back pain Dr. Mitzi Hansen was consulted  and will start treatment on 9/3 He reports that he does have transportation and will be able to get to/from the [facility] once his pain is controlled and he is able to walk Has not been OOB, will order PT consult  Type 2  diabetes mellitus with other specified complication (HCC) Last A1c was 6.7, good control Hold Glucophage Cover with sensitive-scale SSI   Essential hypertension Continue amlodipine and Toprol XL - both were held on presentation due to hypotension but BP has uptrended and so meds were restarted Will add prn IV hydralazine  Aneurysm of ascending aorta without rupture (HCC) 4.6 thoracic aneurysm, needs semi-annual imaging 3.6 infrarenal aneurysm, f/u q2 years  History of stroke Hold Plavix while on Heparin drip, likely ok to dc since he will need AC lifelong Continue Lipitor        Consultants: Oncology Radiation Oncology Palliative care Nutrition   Procedures: None   Antibiotics: None   30 Day Unplanned Readmission Risk Score    Flowsheet Row ED to Hosp-Admission (Current) from 12/27/2022 in [place] 4TH FLOOR PROGRESSIVE CARE AND UROLOGY  30 Day Unplanned Readmission Risk Score (%) 21.22 Filed at 12/31/2022 0401       This score is the patient's risk of an unplanned readmission within 30 days of being discharged (0 -100%). The score is based on dignosis, age, lab data, medications, orders, and past utilization.   Low:  0-14.9   Medium: 15-21.9   High: 22-29.9   Extreme: 30 and above           Subjective: Eating a little better.  Has not been OOB.   Objective: Vitals:   12/31/22 1011 12/31/22 1233  BP: (!) 134/102 (!) 127/100  Pulse: 93 96  Resp:  16  Temp:    SpO2:  90%    Intake/Output Summary (Last 24 hours) at 12/31/2022 1524 Last data filed at 12/31/2022 0700 Gross per 24 hour  Intake 2482.05 ml  Output --  Net 2482.05 ml   Filed Weights   12/27/22 1124  Weight: 77 kg    Exam:  General:  Appears calm and comfortable and is in NAD, mildly disheveled, frail Eyes:   normal lids ENT:  grossly normal hearing, lips & tongue, mmm Neck:  no LAD, masses or thyromegaly Cardiovascular:  RRR, no m/r/g. No LE edema.  Respiratory:   CTA  bilaterally with no wheezes/rales/rhonchi.  Normal respiratory effort. Abdomen:  soft, NT, ND Skin:  no rash or induration seen on limited exam Musculoskeletal:  grossly normal tone BUE/BLE, good ROM, no bony abnormality Psychiatric:  blunted mood and affect, speech fluent and appropriate, AOx3 Neurologic:  CN 2-12 grossly intact, moves all extremities in coordinated fashion  Data Reviewed: I have reviewed the patient's lab results since admission.  Pertinent labs for today include:   Glucose 155 Calcium 10.5, down from 11 Unremarkable CBC     Family Communication: None present; he was on the phone with family at the time of the evaluation  Disposition: Status is: Inpatient Remains inpatient appropriate because: ongoing treatment     Time spent: 35 minutes  Unresulted Labs (From admission, onward)     Start     Ordered   01/01/23 0500  CBC with Differential/Platelet  Tomorrow morning,   R       Question:  Specimen collection method  Answer:  Lab=Lab collect   12/31/22 1524   01/01/23 0500  Basic metabolic panel  Tomorrow morning,   R  Question:  Specimen collection method  Answer:  Lab=Lab collect   12/31/22 1524             Author: Jonah Blue, MD 12/31/2022 3:24 PM  For on call review [email or web address].

## 2023-01-01 DIAGNOSIS — C787 Secondary malignant neoplasm of liver and intrahepatic bile duct: Secondary | ICD-10-CM | POA: Diagnosis not present

## 2023-01-01 LAB — GLUCOSE, CAPILLARY
Glucose-Capillary: 124 mg/dL — ABNORMAL HIGH (ref 70–99)
Glucose-Capillary: 133 mg/dL — ABNORMAL HIGH (ref 70–99)
Glucose-Capillary: 156 mg/dL — ABNORMAL HIGH (ref 70–99)
Glucose-Capillary: 132 mg/dL — ABNORMAL HIGH (ref 70–99)

## 2023-01-01 LAB — BASIC METABOLIC PANEL
Anion gap: 7 (ref 5–15)
BUN: 25 mg/dL — ABNORMAL HIGH (ref 8–23)
CO2: 22 mmol/L (ref 22–32)
Calcium: 10.2 mg/dL (ref 8.9–10.3)
Chloride: 107 mmol/L (ref 98–111)
Creatinine, Ser: 1.01 mg/dL (ref 0.61–1.24)
GFR, Estimated: >60 mL/min (ref >60)
Glucose, Bld: 134 mg/dL — ABNORMAL HIGH (ref 70–99)
Potassium: 4.1 mmol/L (ref 3.5–5.1)
Sodium: 136 mmol/L (ref 135–145)

## 2023-01-01 LAB — CBC WITH DIFFERENTIAL/PLATELET
Abs Immature Granulocytes: 0.03 10*3/uL (ref 0.00–0.07)
Basophils Absolute: 0 10*3/uL (ref 0.0–0.1)
Basophils Relative: 0 %
Eosinophils Absolute: 0 10*3/uL (ref 0.0–0.5)
Eosinophils Relative: 0 %
HCT: 34.1 % — ABNORMAL LOW (ref 39.0–52.0)
Hemoglobin: 11.5 g/dL — ABNORMAL LOW (ref 13.0–17.0)
Immature Granulocytes: 0 %
Lymphocytes Relative: 7 %
Lymphs Abs: 0.5 10*3/uL — ABNORMAL LOW (ref 0.7–4.0)
MCH: 33.2 pg (ref 26.0–34.0)
MCHC: 33.7 g/dL (ref 30.0–36.0)
MCV: 98.6 fL (ref 80.0–100.0)
Monocytes Absolute: 0.6 10*3/uL (ref 0.1–1.0)
Monocytes Relative: 8 %
Neutro Abs: 6.4 10*3/uL (ref 1.7–7.7)
Neutrophils Relative %: 85 %
Platelets: 169 10*3/uL (ref 150–400)
RBC: 3.46 MIL/uL — ABNORMAL LOW (ref 4.22–5.81)
RDW: 14.2 % (ref 11.5–15.5)
WBC: 7.6 10*3/uL (ref 4.0–10.5)
nRBC: 0 % (ref 0.0–0.2)

## 2023-01-01 LAB — CULTURE, BLOOD (ROUTINE X 2)
Culture: NO GROWTH
Culture: NO GROWTH
Special Requests: ADEQUATE
Special Requests: ADEQUATE

## 2023-01-01 MED ORDER — BISACODYL 5 MG PO TBEC
5.0000 mg | DELAYED_RELEASE_TABLET | Freq: Every day | ORAL | Status: DC | PRN
  Administered 2023-01-01: 5 mg via ORAL
  Filled 2023-01-01: qty 1

## 2023-01-01 NOTE — Plan of Care (Signed)
  Problem: Health Behavior/Discharge Planning: Goal: Ability to manage health-related needs will improve Outcome: Progressing   Problem: Clinical Measurements: Goal: Ability to maintain clinical measurements within normal limits will improve Outcome: Progressing   Problem: Nutrition: Goal: Adequate nutrition will be maintained Outcome: Progressing   Problem: Elimination: Goal: Will not experience complications related to bowel motility Outcome: Progressing   Problem: Pain Managment: Goal: General experience of comfort will improve Outcome: Progressing   Problem: Safety: Goal: Ability to remain free from injury will improve Outcome: Progressing   Problem: Coping: Goal: Ability to adjust to condition or change in health will improve Outcome: Progressing   Problem: Nutritional: Goal: Maintenance of adequate nutrition will improve Outcome: Progressing   Problem: Education: Goal: Knowledge of General Education information will improve Description: Including pain rating scale, medication(s)/side effects and non-pharmacologic comfort measures Outcome: Adequate for Discharge   Problem: Clinical Measurements: Goal: Will remain free from infection Outcome: Adequate for Discharge Goal: Diagnostic test results will improve Outcome: Adequate for Discharge Goal: Respiratory complications will improve Outcome: Adequate for Discharge Goal: Cardiovascular complication will be avoided Outcome: Adequate for Discharge   Problem: Coping: Goal: Level of anxiety will decrease Outcome: Adequate for Discharge   Problem: Skin Integrity: Goal: Risk for impaired skin integrity will decrease Outcome: Adequate for Discharge   Problem: Fluid Volume: Goal: Ability to maintain a balanced intake and output will improve Outcome: Adequate for Discharge   Problem: Skin Integrity: Goal: Risk for impaired skin integrity will decrease Outcome: Adequate for Discharge

## 2023-01-01 NOTE — Progress Notes (Signed)
Progress Note   Patient: Gavin Dennis WUJ:[id number] DOB: July 24, 1950 DOA: 12/27/2022     5 DOS: the patient was seen and examined on 01/01/2023   Brief hospital course: 72yo male with h/o lung cancer undergoing treatment and chronic back pain who presented to the cancer center for routine appointment and was found to have tachycardia to 160.  He was sent to the ER and evaluation showed acute PE.  He was started on heparin infusion.  Echo ordered.  Improved HR (thought to be from SVT) with IVF.  Marginal BPs, BP medications held.  Also reported 3 weeks of diarrhea so C diff was ordered; however, patient reported no further diarrhea in the hospital and thinks this was related to Ensure.  Oncology consulted, will see as an outpatient.  He remains focused on aggressive treatment but will consult palliative care for assistance with pain management and GOC.  Also with diffuse MSK lesions so will consult rad onc for possible radiation to relieve pain.  Plan for initiation of radiation on 9/3.  Assessment and Plan: * Pulmonary embolism (HCC) Presumably acute given association with tachycardia, soft blood pressure in the setting of known malignancy Started on heparin infusion -> Eliquis on 8/31 Responded well to fluids in terms of blood pressure and tachycardia Patient is not showing evidence of hemodynamic instability at this time On room air PESI score on admission was Class V, very high risk, 10.0-24.5% 30-day mortality in this group S-PESI score on admission was also high, indicating that the patient has a 8.9% risk of death  He may have been a candidate for intervention but is currently stable and much better in appearance and so does not appear to need intervention With such a small subsegmental PE, his presentation may be related to other considerations with PE being an incidental finding Admitted on telemetry  R heart strain was not seen on CT [place] O2 as needed - none currently Patients with active  cancer are at high risk (>8%/year) for recurrent VTE and should be maintained on indefinite AC.  Hypercalcemia Up to 11 despite IVF, down to 10.5 today May need Zometa Pharmacy recommends holding Zometa for now since Ca++ <12 Improving without intervention currently  Anorexia Patient reports inability to eat - no n/v/d, just does not want food Prior diarrhea with Ensure Will request nutrition consult, as he appears to have severe malnutrition Consider appetite stimulant but this is suboptimal with current VTE Will await recommendations from palliative care  SVT (supraventricular tachycardia) Extreme sinus tachycardia versus PSVT on presentation Patient has responded to fluids Stable now, no longer on telemetry  AKI (acute kidney injury) (HCC) Associated with high anion gap acidosis This is likely multifactorial/prerenal due to low blood pressure from diarrhea as well as possibly from pulmonary embolism Resolved with hydration  Malignant neoplasm of unspecified part of unspecified bronchus or lung (HCC) Appears to have been lost to follow up since last appointment in 07/2022 Stage IIIb (T0 N2, M0) non-small cell lung cancer, adenocarcinoma presented with right hilar and mediastinal lymphadenopathy with no clear primary diagnosed in February 2024 CTA with progressive disease and diffuse mets that were also noted on CT on 12/09/2022 Dr. Arbutus Ped was consulted and saw patient on 8/30 He was offered palliative care/hospice but declined and wants to pursue chemoimmunotherapy He will f/u with oncology as an outpatient He has diffuse bony mets and Dr. Arbutus Ped also recommended consulting Rad Onc for possible radiation to help with his back pain Dr. Mitzi Hansen was consulted  and will start treatment on 9/3 He reports that he does have transportation and will be able to get to/from the [facility] once his pain is controlled and he is able to walk Has not been OOB, will order PT consult  Type 2  diabetes mellitus with other specified complication (HCC) Last A1c was 6.7, good control Hold Glucophage Cover with sensitive-scale SSI   Essential hypertension Continue amlodipine and Toprol XL - both were held on presentation due to hypotension but BP has uptrended and so meds were restarted Will add prn IV hydralazine  Aneurysm of ascending aorta without rupture (HCC) 4.6 thoracic aneurysm, needs semi-annual imaging 3.6 infrarenal aneurysm, f/u q2 years  History of stroke Hold Plavix while on Heparin drip, likely ok to dc since he will need AC lifelong Continue Lipitor        Consultants: Oncology Radiation Oncology Palliative care Nutrition   Procedures: None   Antibiotics: None     30 Day Unplanned Readmission Risk Score    Flowsheet Row ED to Hosp-Admission (Current) from 12/27/2022 in [place] 4TH FLOOR PROGRESSIVE CARE AND UROLOGY  30 Day Unplanned Readmission Risk Score (%) 22.23 Filed at 01/01/2023 0401       This score is the patient's risk of an unplanned readmission within 30 days of being discharged (0 -100%). The score is based on dignosis, age, lab data, medications, orders, and past utilization.   Low:  0-14.9   Medium: 15-21.9   High: 22-29.9   Extreme: 30 and above           Subjective: Sitting up in bedside chair today.  No new issues other than constipation.   Objective: Vitals:   12/31/22 1953 01/01/23 0605  BP: (!) 119/96 (!) 136/105  Pulse: 96 82  Resp: 20 (!) 22  Temp: 97.6 F (36.4 C) 97.8 F (36.6 C)  SpO2: 94% 98%    Intake/Output Summary (Last 24 hours) at 01/01/2023 0725 Last data filed at 01/01/2023 0600 Gross per 24 hour  Intake 2037.3 ml  Output 300 ml  Net 1737.3 ml   Filed Weights   12/27/22 1124  Weight: 77 kg    Exam:  General:  Appears calm and comfortable and is in NAD, mildly disheveled, frail Eyes:   normal lids ENT:  grossly normal hearing, lips & tongue, mmm Neck:  no LAD, masses or  thyromegaly Cardiovascular:  RRR, no m/r/g. No LE edema.  Respiratory:   CTA bilaterally with no wheezes/rales/rhonchi.  Normal respiratory effort. Abdomen:  soft, NT, ND Skin:  no rash or induration seen on limited exam Musculoskeletal:  grossly normal tone BUE/BLE, good ROM, no bony abnormality Psychiatric:  blunted mood and affect, speech fluent and appropriate, AOx3 Neurologic:  CN 2-12 grossly intact, moves all extremities in coordinated fashion  Data Reviewed: I have reviewed the patient's lab results since admission.  Pertinent labs for today include:  Glucose 134 WBC 7.6 Hgb 11.5     Family Communication: None present  Disposition: Status is: Inpatient Remains inpatient appropriate because: ongoing evaluation/treatment     Time spent: 35 minutes  Unresulted Labs (From admission, onward)    None        Author: Jonah Blue, MD 01/01/2023 7:25 AM  For on call review [email or web address]

## 2023-01-01 NOTE — Evaluation (Addendum)
Physical Therapy Evaluation Patient Details Name: Gavin Dennis MRN: [id number] DOB: Nov 13, 1950 Today's Date: 01/01/2023  History of Present Illness  72yo male with h/o lung cancer with diffuse bony mets undergoing treatment and chronic back pain who presented to the cancer center for routine appointment and was found to have tachycardia to 160.  He was sent to the ER and evaluation showed acute PE. PMH includes CVA 2022 and 03/2022, DM, HTN. CT 12/09/22 showed T10 and T12 compression fracture deformities.   Clinical Impression  Pt admitted with above diagnosis. Mod assist for sit to stand and for balance for stand pivot transfer from chair to bed with RW. Pt unsteady in standing, B knees flexed, poorly controlled descent to bed. He stated he hasn't walked for 3 months due to "breaking his back". He reports he mostly stays in bed. To get to appointments his son holds him up so he can take steps from bed to the porch. Pt would benefit from a WC.  Pt currently with functional limitations due to the deficits listed below (see PT Problem List). Pt will benefit from acute skilled PT to increase their independence and safety with mobility to allow discharge.           If plan is discharge home, recommend the following: A lot of help with walking and/or transfers;A lot of help with bathing/dressing/bathroom;Assistance with cooking/housework;Assist for transportation;Help with stairs or ramp for entrance   Can travel by private vehicle        Equipment Recommendations Wheelchair (measurements PT);Wheelchair cushion (measurements PT)  Recommendations for Other Services       Functional Status Assessment Patient has had a recent decline in their functional status and demonstrates the ability to make significant improvements in function in a reasonable and predictable amount of time.     Precautions / Restrictions Precautions Precautions: Fall Precaution Comments: pt denies falls in past 6  months Restrictions Weight Bearing Restrictions: No      Mobility  Bed Mobility Overal bed mobility: Needs Assistance Bed Mobility: Sit to Supine       Sit to supine: Mod assist   General bed mobility comments: assist for BLEs into bed    Transfers Overall transfer level: Needs assistance Equipment used: Rolling walker (2 wheels) Transfers: Sit to/from Stand, Bed to chair/wheelchair/BSC Sit to Stand: Mod assist           General transfer comment: B knees flexed in standing, pt took a few shuffling steps from recliner to bed with RW with mod A for balance, poorly controlled descent to bed    Ambulation/Gait                  Stairs            Wheelchair Mobility     Tilt Bed    Modified Rankin (Stroke Patients Only)       Balance Overall balance assessment: Needs assistance Sitting-balance support: Feet supported, No upper extremity supported Sitting balance-Leahy Scale: Fair     Standing balance support: Bilateral upper extremity supported, During functional activity, Reliant on assistive device for balance Standing balance-Leahy Scale: Poor Standing balance comment: pt denies falls in past 6 months                             Pertinent Vitals/Pain Pain Assessment Pain Assessment: No/denies pain    Home Living Family/patient expects to be discharged to:: Private residence Living Arrangements: Children;Other (  Comment) (daughter in law) Available Help at Discharge: Family;Available 24 hours/day Type of Home: House Home Access: Stairs to enter Entrance Stairs-Rails: Right;Left;Can reach both Entrance Stairs-Number of Steps: 3   Home Layout: One level Home Equipment: Agricultural consultant (2 wheels) Additional Comments: has fall alert button/necklace    Prior Function Prior Level of Function : Needs assist             Mobility Comments: pt stated he's not been able to walk for ~3 months due to "breaking his back"; states  he's primarily bed bound. Son holds him up to walk from bed to porch to get out for appointments. ADLs Comments: daughter in law assists with bathing/dressing, pt voids in the bed     Extremity/Trunk Assessment   Upper Extremity Assessment Upper Extremity Assessment: Generalized weakness    Lower Extremity Assessment Lower Extremity Assessment: Generalized weakness    Cervical / Trunk Assessment Cervical / Trunk Assessment: Normal  Communication   Communication Communication: No apparent difficulties  Cognition Arousal: Alert Behavior During Therapy: WFL for tasks assessed/performed Overall Cognitive Status: Within Functional Limits for tasks assessed                                          General Comments      Exercises     Assessment/Plan    PT Assessment Patient needs continued PT services  PT Problem List Decreased strength;Decreased activity tolerance;Decreased balance;Decreased mobility       PT Treatment Interventions DME instruction;Gait training;Functional mobility training;Therapeutic activities;Therapeutic exercise;Balance training    PT Goals (Current goals can be found in the Care Plan section)  Acute Rehab PT Goals Patient Stated Goal: to get stronger PT Goal Formulation: With patient Time For Goal Achievement: 01/15/23 Potential to Achieve Goals: Fair    Frequency Min 1X/week     Co-evaluation               AM-PAC PT "6 Clicks" Mobility  Outcome Measure Help needed turning from your back to your side while in a flat bed without using bedrails?: A Lot Help needed moving from lying on your back to sitting on the side of a flat bed without using bedrails?: A Lot Help needed moving to and from a bed to a chair (including a wheelchair)?: A Lot Help needed standing up from a chair using your arms (e.g., wheelchair or bedside chair)?: A Lot Help needed to walk in hospital room?: Total Help needed climbing 3-5 steps with a  railing? : Total 6 Click Score: 10    End of Session Equipment Utilized During Treatment: Gait belt Activity Tolerance: Patient limited by fatigue;Patient tolerated treatment well Patient left: in bed;with call bell/phone within reach;with bed alarm set Nurse Communication: Mobility status PT Visit Diagnosis: Difficulty in walking, not elsewhere classified (R26.2);Other abnormalities of gait and mobility (R26.89);Unsteadiness on feet (R26.81)    Time: 4098-1191 PT Time Calculation (min) (ACUTE ONLY): 14 min   Charges:   PT Evaluation $PT Eval Moderate Complexity: 1 Mod   PT General Charges $$ ACUTE PT VISIT: 1 Visit         Tamala Ser PT 01/01/2023  Acute Rehabilitation Services  Office [phone]

## 2023-01-01 NOTE — Progress Notes (Signed)
Chaplain engaged in a follow-up visit. Gavin Dennis would like to finish document tomorrow, giving him time to talk to his youngest son. He does want to complete the document but wants to make sure he is assigning his healthcare agents as he desires.   Chaplain will follow-up tomorrow.    01/01/23 1000  Spiritual Encounters  Type of Visit Follow up  Reason for visit Advance directives

## 2023-01-01 NOTE — Plan of Care (Signed)

## 2023-01-02 ENCOUNTER — Encounter: Payer: Self-pay | Admitting: Cardiovascular Disease

## 2023-01-02 ENCOUNTER — Ambulatory Visit
Admit: 2023-01-02 | Discharge: 2023-01-02 | Disposition: A | Discharge status: Home / Self Care | Payer: Medicare HMO | Attending: Radiation Oncology | Admitting: Radiation Oncology

## 2023-01-02 DIAGNOSIS — C787 Secondary malignant neoplasm of liver and intrahepatic bile duct: Secondary | ICD-10-CM | POA: Diagnosis not present

## 2023-01-02 DIAGNOSIS — C3490 Malignant neoplasm of unspecified part of unspecified bronchus or lung: Secondary | ICD-10-CM

## 2023-01-02 LAB — GLUCOSE, CAPILLARY
Glucose-Capillary: 127 mg/dL — ABNORMAL HIGH (ref 70–99)
Glucose-Capillary: 123 mg/dL — ABNORMAL HIGH (ref 70–99)
Glucose-Capillary: 152 mg/dL — ABNORMAL HIGH (ref 70–99)
Glucose-Capillary: 136 mg/dL — ABNORMAL HIGH (ref 70–99)

## 2023-01-02 NOTE — Progress Notes (Signed)
Chaplain worked to follow-up regarding Healthcare POA documents but [name] was sleeping at time of visit. Chaplain did call his name and [name] could not be awakened. Chaplain can try again tomorrow.    01/02/23 1400  Spiritual Encounters  Type of Visit Follow up  Reason for visit Advance directives

## 2023-01-02 NOTE — Plan of Care (Signed)
  Problem: Activity: Goal: Risk for activity intolerance will decrease Outcome: Progressing   Problem: Nutrition: Goal: Adequate nutrition will be maintained Outcome: Progressing   Problem: Pain Managment: Goal: General experience of comfort will improve Outcome: Progressing   Problem: Nutritional: Goal: Maintenance of adequate nutrition will improve Outcome: Progressing   Problem: Education: Goal: Knowledge of General Education information will improve Description: Including pain rating scale, medication(s)/side effects and non-pharmacologic comfort measures Outcome: Adequate for Discharge   Problem: Clinical Measurements: Goal: Ability to maintain clinical measurements within normal limits will improve Outcome: Adequate for Discharge Goal: Will remain free from infection Outcome: Adequate for Discharge Goal: Diagnostic test results will improve Outcome: Adequate for Discharge Goal: Cardiovascular complication will be avoided Outcome: Adequate for Discharge   Problem: Coping: Goal: Level of anxiety will decrease Outcome: Adequate for Discharge   Problem: Elimination: Goal: Will not experience complications related to bowel motility Outcome: Adequate for Discharge Goal: Will not experience complications related to urinary retention Outcome: Adequate for Discharge   Problem: Safety: Goal: Ability to remain free from injury will improve Outcome: Adequate for Discharge   Problem: Skin Integrity: Goal: Risk for impaired skin integrity will decrease Outcome: Adequate for Discharge   Problem: Fluid Volume: Goal: Ability to maintain a balanced intake and output will improve Outcome: Adequate for Discharge   Problem: Skin Integrity: Goal: Risk for impaired skin integrity will decrease Outcome: Adequate for Discharge

## 2023-01-02 NOTE — Progress Notes (Signed)
TO BE COMPLETED BY RADIATION ONCOLOGIST OFFICE:   Patient Name: Gavin Dennis   Date of Birth: [birth year]   Radiation Oncologist: Dr. Mitzi Hansen  Site to be Treated: Lower T-Spine  Will x-rays >10 MV be used? No  Will the radiation be >10 cm from the device? Yes  Planned Treatment Start Date:   TO BE COMPLETED BY CARDIOLOGIST OFFICE:   Device Information:  Loop Recorder   Brand: Medtronic: [id number] Model #: Medtronic [id number] Reveal [id number]  Serial Number: [id number]     Date of Placement: 03/23/2022  Site of Placement: Chest  Remote Device Check--Frequency: Every 33 days   Last Check: 12/15/2022  Is the Patient Pacer Dependent?: N/A Does cardiologist request Radiation Oncology to schedule device testing by vendor for the following:  Prior to the Initiation of Treatments?  Yes []  No [x]  During Treatments?  Yes []  No [x]  Post Radiation Treatments?  Yes []  No [x]   Is device monitoring necessary by vendor/cardiologist team during treatments?  Yes []   No [x]   Is cardiac monitoring by Radiation Oncology nursing necessary during treatments? Yes []   No [x]   Do you recommend device be relocated prior to Radiation Treatment? Yes []   No [x]   **PLEASE LIST ANY NOTES OR SPECIAL REQUESTS:       CARDIOLOGIST SIGNATURE:  Dr. Alessandra Bevels Per Device Clinic Standing Orders, Lenor Coffin  01/02/2023 3:07 PM  **Please route completed form back to Radiation Oncology Nursing and "P [facility] RAD ONC ADMIN", OR send an update if there will be a delay in having form completed by expected start date.  **Call [phone] if you have any questions or do not get an in-basket response from a Radiation Oncology staff member

## 2023-01-02 NOTE — Progress Notes (Signed)
Progress Note   Patient: Gavin Dennis JXB:[id number] DOB: 18-Feb-1951 DOA: 12/27/2022     6 DOS: the patient was seen and examined on 01/02/2023   Brief hospital course: 72yo male with h/o lung cancer undergoing treatment and chronic back pain who presented to the cancer center for routine appointment and was found to have tachycardia to 160.  He was sent to the ER and evaluation showed acute PE.  He was started on heparin infusion.  Echo ordered.  Improved HR (thought to be from SVT) with IVF.  Marginal BPs, BP medications held.  Also reported 3 weeks of diarrhea so C diff was ordered; however, patient reported no further diarrhea in the hospital and thinks this was related to Ensure.  Oncology consulted, will see as an outpatient.  He remains focused on aggressive treatment but will consult palliative care for assistance with pain management and GOC.  Also with diffuse MSK lesions so will consult rad onc for possible radiation to relieve pain.  Plan for initiation of radiation on 9/3.  Assessment and Plan: * Pulmonary embolism (HCC) Presumably acute given association with tachycardia, soft blood pressure in the setting of known malignancy Started on heparin infusion -> Eliquis on 8/31 Responded well to fluids in terms of blood pressure and tachycardia Patient is not showing evidence of hemodynamic instability at this time On room air PESI score on admission was Class V, very high risk, 10.0-24.5% 30-day mortality in this group S-PESI score on admission was also high, indicating that the patient has a 8.9% risk of death  He may have been a candidate for intervention but is currently stable and much better in appearance and so does not appear to need intervention With such a small subsegmental PE, his presentation may be related to other considerations with PE being an incidental finding Admitted on telemetry -> med surg R heart strain was not seen on CT [place] O2 as needed - none currently Patients  with active cancer are at high risk (>8%/year) for recurrent VTE and should be maintained on indefinite AC.  Hypercalcemia Up to 11 despite IVF, down to 10.5 today May need Zometa Pharmacy recommends holding Zometa for now since Ca++ <12 Improving without intervention currently Recheck BMP in AM  Anorexia Patient reports inability to eat - no n/v/d, just does not want food Prior diarrhea with Ensure Will request nutrition consult, as he appears to have severe malnutrition Consider appetite stimulant but this is suboptimal with current VTE Will await recommendations from palliative care  AKI (acute kidney injury) (HCC) Associated with high anion gap acidosis This is likely multifactorial/prerenal due to low blood pressure from diarrhea as well as possibly from pulmonary embolism Resolved with hydration  Malignant neoplasm of unspecified part of unspecified bronchus or lung (HCC) Appears to have been lost to follow up since last appointment in 07/2022 Stage IIIb (T0 N2, M0) non-small cell lung cancer, adenocarcinoma presented with right hilar and mediastinal lymphadenopathy with no clear primary diagnosed in February 2024 CTA with progressive disease and diffuse mets that were also noted on CT on 12/09/2022 Dr. Arbutus Ped was consulted and saw patient on 8/30 He was offered palliative care/hospice but declined and wants to pursue chemoimmunotherapy He will f/u with oncology as an outpatient He has diffuse bony mets and Dr. Arbutus Ped also recommended consulting Rad Onc for possible radiation to help with his back pain Dr. Mitzi Hansen was consulted and will start treatment on 9/3 He reports that he does have transportation and will be  able to get to/from the [facility] once his pain is controlled and he is able to walk PT consulted, recommends HHPT Palliative care is consulting, assisting with GOC as well as pain control  Type 2 diabetes mellitus with other specified complication (HCC) Last A1c  was 6.7, good control Hold Glucophage Cover with sensitive-scale SSI   Essential hypertension Continue amlodipine and Toprol XL - both were held on presentation due to hypotension but BP has uptrended and so meds were restarted Will add prn IV hydralazine  Aneurysm of ascending aorta without rupture (HCC) 4.6 thoracic aneurysm, needs semi-annual imaging 3.6 infrarenal aneurysm, f/u q2 years  History of stroke Hold Plavix while on Heparin drip, likely ok to dc since he will need AC lifelong Continue Lipitor     Consultants: Oncology Radiation Oncology Palliative care Nutrition   Procedures: None   Antibiotics: None     30 Day Unplanned Readmission Risk Score    Flowsheet Row ED to Hosp-Admission (Current) from 12/27/2022 in [place] 4TH FLOOR PROGRESSIVE CARE AND UROLOGY  30 Day Unplanned Readmission Risk Score (%) 25.42 Filed at 01/02/2023 0801       This score is the patient's risk of an unplanned readmission within 30 days of being discharged (0 -100%). The score is based on dignosis, age, lab data, medications, orders, and past utilization.   Low:  0-14.9   Medium: 15-21.9   High: 22-29.9   Extreme: 30 and above           Subjective: No complaints today.  Starting radiation and hoping this will allow him to become more mobile.   Objective: Vitals:   01/02/23 0616 01/02/23 0800  BP: (!) 133/97 (!) 140/85  Pulse: 70 77  Resp: 18 18  Temp: 98.1 F (36.7 C) 97.7 F (36.5 C)  SpO2: 95% 100%    Intake/Output Summary (Last 24 hours) at 01/02/2023 1332 Last data filed at 01/02/2023 0815 Gross per 24 hour  Intake 2330.79 ml  Output 1650 ml  Net 680.79 ml   Filed Weights   12/27/22 1124  Weight: 77 kg    Exam:  General:  Appears calm and comfortable and is in NAD, mildly disheveled, frail Eyes:   normal lids ENT:  grossly normal hearing, lips & tongue, mmm Neck:  no LAD, masses or thyromegaly Cardiovascular:  RRR, no m/r/g. No LE edema.   Respiratory:   CTA bilaterally with no wheezes/rales/rhonchi.  Normal respiratory effort. Abdomen:  soft, NT, ND Skin:  no rash or induration seen on limited exam Musculoskeletal:  grossly normal tone BUE/BLE, good ROM, no bony abnormality Psychiatric:  blunted mood and affect, speech fluent and appropriate, AOx3 Neurologic:  CN 2-12 grossly intact, moves all extremities in coordinated fashion  Data Reviewed: I have reviewed the patient's lab results since admission.  Pertinent labs for today include:   None today     Family Communication: None present  Disposition: Status is: Inpatient Remains inpatient appropriate because: starting radiation today     Time spent: 35 minutes  Unresulted Labs (From admission, onward)     Start     Ordered   Unscheduled  CBC with Differential/Platelet  Tomorrow morning,   R       Question:  Specimen collection method  Answer:  Lab=Lab collect   01/02/23 1332   Unscheduled  Basic metabolic panel  Tomorrow morning,   R       Question:  Specimen collection method  Answer:  Lab=Lab collect  01/02/23 1332             Author: Jonah Blue, MD 01/02/2023 1:32 PM  For on call review [email or web address].

## 2023-01-02 NOTE — Progress Notes (Signed)
ANTICOAGULATION CONSULT NOTE - Follow Up Consult  Pharmacy Consult for Eliquis Indication: pulmonary embolus  No Known Allergies  Patient Measurements: Height: 5\' 11"  (180.3 cm) Weight: 77 kg (169 lb 12.1 oz) IBW/kg (Calculated) : 75.3  Vital Signs: Temp: 97.7 F (36.5 C) (09/03 0800) Temp Source: Oral (09/03 0800) BP: 140/85 (09/03 0800) Pulse Rate: 77 (09/03 0800)  Labs: Recent Labs    12/31/22 0547 01/01/23 0505  HGB 12.1* 11.5*  HCT 36.4* 34.1*  PLT 150 169  CREATININE 0.88 1.01    Estimated Creatinine Clearance: 72.5 mL/min (by C-G formula based on SCr of 1.01 mg/dL).   Assessment: AC/Heme: Eliquis as of 8/31 for PE. - 8/28 chest CT: . Small left upper lobe segmental pulmonary embolus, new since prior study. Minimal clot burden without right heart strain. - Hgb 11.5 down some, Plts 169  Goal of Therapy:  Therapeutic oral anticoagulation  Plan:  Eliquis 10mg  BID x 7d then 5mg  BID Pharmacy will sign off. Please reconsult for further dosing assitance. Will follow peripherally. Patient education completed.   Lummie Montijo S. Merilynn Finland, PharmD, BCPS Clinical Staff Pharmacist [email or web address] Merilynn Finland, Tod Abrahamsen Stillinger 01/02/2023,11:23 AM

## 2023-01-03 ENCOUNTER — Other Ambulatory Visit: Payer: Self-pay

## 2023-01-03 ENCOUNTER — Ambulatory Visit
Admit: 2023-01-03 | Discharge: 2023-01-03 | Disposition: A | Discharge status: Home / Self Care | Payer: Medicare HMO | Attending: Radiation Oncology | Admitting: Radiation Oncology

## 2023-01-03 DIAGNOSIS — I2693 Single subsegmental pulmonary embolism without acute cor pulmonale: Secondary | ICD-10-CM

## 2023-01-03 LAB — BASIC METABOLIC PANEL
Anion gap: 9 (ref 5–15)
BUN: 21 mg/dL (ref 8–23)
CO2: 22 mmol/L (ref 22–32)
Calcium: 9.7 mg/dL (ref 8.9–10.3)
Chloride: 103 mmol/L (ref 98–111)
Creatinine, Ser: 0.73 mg/dL (ref 0.61–1.24)
GFR, Estimated: >60 mL/min (ref >60)
Glucose, Bld: 123 mg/dL — ABNORMAL HIGH (ref 70–99)
Potassium: 3.6 mmol/L (ref 3.5–5.1)
Sodium: 134 mmol/L — ABNORMAL LOW (ref 135–145)

## 2023-01-03 LAB — CBC WITH DIFFERENTIAL/PLATELET
Abs Immature Granulocytes: 0.18 10*3/uL — ABNORMAL HIGH (ref 0.00–0.07)
Basophils Absolute: 0.1 10*3/uL (ref 0.0–0.1)
Basophils Relative: 0 %
Eosinophils Absolute: 0.2 10*3/uL (ref 0.0–0.5)
Eosinophils Relative: 2 %
HCT: 36.5 % — ABNORMAL LOW (ref 39.0–52.0)
Hemoglobin: 12.1 g/dL — ABNORMAL LOW (ref 13.0–17.0)
Immature Granulocytes: 2 %
Lymphocytes Relative: 9 %
Lymphs Abs: 1 10*3/uL (ref 0.7–4.0)
MCH: 33.4 pg (ref 26.0–34.0)
MCHC: 33.2 g/dL (ref 30.0–36.0)
MCV: 100.8 fL — ABNORMAL HIGH (ref 80.0–100.0)
Monocytes Absolute: 1.1 10*3/uL — ABNORMAL HIGH (ref 0.1–1.0)
Monocytes Relative: 9 %
Neutro Abs: 9 10*3/uL — ABNORMAL HIGH (ref 1.7–7.7)
Neutrophils Relative %: 78 %
Platelets: 179 10*3/uL (ref 150–400)
RBC: 3.62 MIL/uL — ABNORMAL LOW (ref 4.22–5.81)
RDW: 14.5 % (ref 11.5–15.5)
WBC: 11.5 10*3/uL — ABNORMAL HIGH (ref 4.0–10.5)
nRBC: 0.3 % — ABNORMAL HIGH (ref 0.0–0.2)

## 2023-01-03 LAB — RAD ONC ARIA SESSION SUMMARY
Course Elapsed Days: 0
Plan Fractions Treated to Date: 1
Plan Prescribed Dose Per Fraction: 3 Gy
Plan Total Fractions Prescribed: 10
Plan Total Prescribed Dose: 30 Gy
Reference Point Dosage Given to Date: 3 Gy
Reference Point Session Dosage Given: 3 Gy
Session Number: 1

## 2023-01-03 LAB — GLUCOSE, CAPILLARY
Glucose-Capillary: 109 mg/dL — ABNORMAL HIGH (ref 70–99)
Glucose-Capillary: 126 mg/dL — ABNORMAL HIGH (ref 70–99)
Glucose-Capillary: 156 mg/dL — ABNORMAL HIGH (ref 70–99)
Glucose-Capillary: 113 mg/dL — ABNORMAL HIGH (ref 70–99)

## 2023-01-03 MED ORDER — LIDOCAINE 5 % EX PTCH: 1.0000 | MEDICATED_PATCH | CUTANEOUS | Status: DC

## 2023-01-03 MED ORDER — HYDROMORPHONE HCL 1 MG/ML IJ SOLN
1.0000 mg | INTRAMUSCULAR | Status: DC | PRN
  Administered 2023-01-03 – 2023-01-08 (×4): 1 mg via INTRAVENOUS
  Filled 2023-01-03 (×4): qty 1

## 2023-01-03 MED ORDER — OXYCODONE HCL 5 MG PO TABS
10.0000 mg | ORAL_TABLET | ORAL | Status: DC | PRN
  Administered 2023-01-03 – 2023-01-08 (×14): 10 mg via ORAL
  Filled 2023-01-03 (×14): qty 2

## 2023-01-03 NOTE — Progress Notes (Signed)
PT Cancellation Note  Patient Details Name: Gavin Dennis MRN: [id number] DOB: [birth year]   Cancelled Treatment:       Pt in bed sleeping/resting after a bad day of pain, per RN.  Did not attempt to see.     Armando Reichert 01/03/2023, 4:00 PM

## 2023-01-03 NOTE — Progress Notes (Addendum)
PROGRESS NOTE    Gavin Dennis  ZOX:[id number] DOB: 05/18/1950 DOA: 12/27/2022 PCP: Ollen Bowl, MD   Brief Narrative: 72yo male with h/o lung cancer undergoing treatment and chronic back pain who presented to the cancer center for routine appointment and was found to have tachycardia to 160. He was sent to the ER and evaluation showed acute PE. He was started on heparin infusion. Echo ordered. Improved HR (thought to be from SVT) with IVF. Marginal BPs, BP medications held. Also reported 3 weeks of diarrhea so C diff was ordered; however, patient reported no further diarrhea in the hospital and thinks this was related to Ensure. Oncology consulted, will see as an outpatient. He remains focused on aggressive treatment but will consult palliative care for assistance with pain management and GOC. Also with diffuse MSK lesions so will consult rad onc for possible radiation to relieve pain. Plan for initiation of radiation on 9/3.   Assessment & Plan:   Principal Problem:   Pulmonary embolism (HCC) Active Problems:   History of stroke   Aneurysm of ascending aorta without rupture (HCC)   Essential hypertension   Type 2 diabetes mellitus with other specified complication (HCC)   Malignant neoplasm of unspecified part of unspecified bronchus or lung (HCC)   Metastatic cancer to liver (HCC)   AKI (acute kidney injury) (HCC)   Anorexia   Hypercalcemia   Pulmonary embolism (HCC) Presumably acute given association with tachycardia, soft blood pressure in the setting of known malignancy Started on heparin infusion -> Eliquis on 8/31 He is able to maintain his saturation on room air. R heart strain was not seen on CT Patients with active cancer are at high risk (>8%/year) for recurrent VTE and should be maintained on indefinite AC.   Hypercalcemia-down to 9.7 with IV fluids. Pharmacy recommends holding Zometa for now since Ca++ <12   Anorexia Patient reports inability to eat - no n/v/d,  just does not want food Prior diarrhea with Ensure Will request nutrition consult, as he appears to have severe malnutrition Consider appetite stimulant but this is suboptimal with current VTE Will await recommendations from palliative care   AKI (acute kidney injury) (HCC) Associated with high anion gap acidosis This is likely multifactorial/prerenal due to low blood pressure from diarrhea as well as possibly from pulmonary embolism Resolved with hydration    Malignant neoplasm of unspecified part of unspecified bronchus or lung (HCC) Appears to have been lost to follow up since last appointment in 07/2022 Stage IIIb (T0 N2, M0) non-small cell lung cancer, adenocarcinoma presented with right hilar and mediastinal lymphadenopathy with no clear primary diagnosed in February 2024 CTA with progressive disease and diffuse mets that were also noted on CT on 12/09/2022 Dr. Arbutus Ped was consulted and saw patient on 8/30 He was offered palliative care/hospice but declined and wants to pursue chemoimmunotherapy He will f/u with oncology as an outpatient He has diffuse bony mets and Dr. Arbutus Ped also recommended consulting Rad Onc for possible radiation to help with his back pain Dr. Mitzi Hansen was consulted and will started treatment on 9/3 He reports that he does have transportation and will be able to get to/from the Cancer Center once his pain is controlled and he is able to walk PT consulted, recommends HHPT Palliative care is consulting, assisting with GOC as well as pain control Increase oxycodone Added lidocaine patch   Type 2 diabetes mellitus with other specified complication (HCC) Last A1c was 6.7, good control Hold Glucophage Cover with sensitive-scale  SSI    Essential hypertension Continue amlodipine and Toprol XL - both were held on presentation due to hypotension but BP has uptrended and so meds were restarted Will add prn IV hydralazine   Aneurysm of ascending aorta without rupture  (HCC) 4.6 thoracic aneurysm, needs semi-annual imaging 3.6 infrarenal aneurysm, f/u q2 years   History of stroke Hold Plavix while on Heparin drip, likely ok to dc since he will need AC lifelong Continue Lipitor  Estimated body mass index is 23.68 kg/m as calculated from the following:   Height as of this encounter: 5\' 11"  (1.803 m).   Weight as of this encounter: 77 kg.  Consultants: Oncology Radiation Oncology Palliative care Nutrition   Procedures: None   Antibiotics: None  Subjective:  Daughter at bedside  Complains of excruciating back pain unable to position himself in bed comfortably. Daughter requesting lidocaine patch and possibly increasing his pain medication.  Objective: Vitals:   01/02/23 1421 01/02/23 2056 01/03/23 0549 01/03/23 1238  BP: 119/80 112/84 (!) 132/95 (!) 134/97  Pulse: 98 63 67 92  Resp: 19 19 19 18   Temp: 98.9 F (37.2 C) 98.8 F (37.1 C) 98.1 F (36.7 C) 97.7 F (36.5 C)  TempSrc: Oral Oral Oral Oral  SpO2: 98% 94% 100% 91%  Weight:      Height:        Intake/Output Summary (Last 24 hours) at 01/03/2023 1630 Last data filed at 01/03/2023 1300 Gross per 24 hour  Intake 1468.64 ml  Output 2300 ml  Net -831.36 ml   Filed Weights   12/27/22 1124  Weight: 77 kg    Examination:  General exam: Appears in moderate distress due to back pain Respiratory system: Clear to auscultation. Respiratory effort normal. Cardiovascular system: S1 & S2 heard, RRR. No JVD, murmurs, rubs, gallops or clicks. No pedal edema. Gastrointestinal system: Abdomen is nondistended, soft and nontender. No organomegaly or masses felt. Normal bowel sounds heard. Central nervous system: Alert and oriented. Extremities: No edema  Data Reviewed: I have personally reviewed following labs and imaging studies  CBC: Recent Labs  Lab 12/29/22 0511 12/30/22 0547 12/31/22 0547 01/01/23 0505 01/03/23 0506  WBC 8.2 8.3 5.4 7.6 11.5*  NEUTROABS  --   --  4.8 6.4  9.0*  HGB 12.6* 12.2* 12.1* 11.5* 12.1*  HCT 38.1* 36.6* 36.4* 34.1* 36.5*  MCV 99.5 98.7 99.5 98.6 100.8*  PLT 149* 144* 150 169 179   Basic Metabolic Panel: Recent Labs  Lab 12/28/22 0045 12/29/22 0511 12/30/22 0547 12/31/22 0547 01/01/23 0505 01/03/23 0506  NA 137 140 138 136 136 134*  K 4.2 3.9 3.7 4.1 4.1 3.6  CL 106 108 104 105 107 103  CO2 22 25 24 22 22 22   GLUCOSE 110* 113* 94 155* 134* 123*  BUN 30* 22 16 20  25* 21  CREATININE 1.10 0.88 0.76 0.88 1.01 0.73  CALCIUM 10.9* 11.2* 11.0* 10.5* 10.2 9.7  MG 1.6*  --   --   --   --   --    GFR: Estimated Creatinine Clearance: 91.5 mL/min (by C-G formula based on SCr of 0.73 mg/dL). Liver Function Tests: No results for input(s): "AST", "ALT", "ALKPHOS", "BILITOT", "PROT", "ALBUMIN" in the last 168 hours. No results for input(s): "LIPASE", "AMYLASE" in the last 168 hours. No results for input(s): "AMMONIA" in the last 168 hours. Coagulation Profile: Recent Labs  Lab 12/27/22 1705 12/28/22 0045  INR 1.2 1.2   Cardiac Enzymes: No results for input(s): "CKTOTAL", "  CKMB", "CKMBINDEX", "TROPONINI" in the last 168 hours. BNP (last 3 results) No results for input(s): "PROBNP" in the last 8760 hours. HbA1C: No results for input(s): "HGBA1C" in the last 72 hours. CBG: Recent Labs  Lab 01/02/23 1142 01/02/23 1716 01/02/23 2056 01/03/23 0812 01/03/23 1133  GLUCAP 123* 152* 136* 109* 126*   Lipid Profile: No results for input(s): "CHOL", "HDL", "LDLCALC", "TRIG", "CHOLHDL", "LDLDIRECT" in the last 72 hours. Thyroid Function Tests: No results for input(s): "TSH", "T4TOTAL", "FREET4", "T3FREE", "THYROIDAB" in the last 72 hours. Anemia Panel: No results for input(s): "VITAMINB12", "FOLATE", "FERRITIN", "TIBC", "IRON", "RETICCTPCT" in the last 72 hours. Sepsis Labs: No results for input(s): "PROCALCITON", "LATICACIDVEN" in the last 168 hours.  Recent Results (from the past 240 hour(s))  Culture, blood (Routine X 2) w  Reflex to ID Panel     Status: None   Collection Time: 12/27/22  8:14 PM   Specimen: BLOOD RIGHT ARM  Result Value Ref Range Status   Specimen Description   Final    BLOOD RIGHT ARM Performed at [facility], [address] [place], [place] [address]    Special Requests   Final    BOTTLES DRAWN AEROBIC AND ANAEROBIC Blood Culture adequate volume Performed at [facility], [address], [place], [place] [address]    Culture   Final    NO GROWTH 5 DAYS Performed at [facility], [address] [place], [place] [address]    Report Status 01/01/2023 FINAL  Final  Culture, blood (Routine X 2) w Reflex to ID Panel     Status: None   Collection Time: 12/27/22  8:18 PM   Specimen: BLOOD  Result Value Ref Range Status   Specimen Description BLOOD SITE NOT SPECIFIED  Final   Special Requests   Final    BOTTLES DRAWN AEROBIC ONLY Blood Culture adequate volume   Culture   Final    NO GROWTH 5 DAYS Performed at [facility], [address] [place], [place] [address]    Report Status 01/01/2023 FINAL  Final    Radiology Studies: No results found.   Scheduled Meds:  acetaminophen  1,000 mg Oral Q8H   amLODipine  10 mg Oral Daily   apixaban  10 mg Oral BID   Followed by   Melene Muller ON 01/06/2023] apixaban  5 mg Oral BID   atorvastatin  20 mg Oral q AM   insulin aspart  0-9 Units Subcutaneous TID WC   lidocaine  1 patch Transdermal Q24H   metoprolol succinate  25 mg Oral Daily   pantoprazole  40 mg Oral Daily   sodium chloride flush  3 mL Intravenous Q12H   Continuous Infusions:  lactated ringers 75 mL/hr at 01/03/23 0200     LOS: 7 days    Time spent: 38 min Alwyn Ren, MD 01/03/2023, 4:30 PM

## 2023-01-03 NOTE — Progress Notes (Signed)
Palliative Medicine Progress Note   Patient Name: Gavin Dennis       Date: 01/03/2023 DOB: November 07, 1950  Age: 72 y.o. MRN#: [id number] Attending Physician: Alwyn Ren, MD Primary Care Physician: Ollen Bowl, MD Admit Date: 12/27/2022  Reason for Consultation/Follow-up: {Reason for Consult:23484}  HPI/Patient Profile: Gavin Dennis is a 72 y.o. male  with metastatic non-small cell lung cancer diagnosed in February 2024.  He underwent a course of concurrent chemoradiation with partial response, but then was lost to follow-up.   He presented to the ED on 12/09/2022 with back pain and CT showed T-10 compression fracture.  CT A/P showed evidence of new liver metastases, suspicion of early new left adrenal metastasis, and new iliac bone metastasis.   He was seen in the oncology office 12/27/2022 for re-evaluation and discussion of treatment options but was found to have tachycardia and was subsequently sent to the hospital with SVT.  CTA showed pulmonary embolism.    Palliative Medicine has been consulted for goals of care  Subjective: Chart reviewed. Patient underwent radiation simulation yesterday.   Objective:  Physical Exam          Vital Signs: BP (!) 134/97 (BP Location: Right Arm)   Pulse 92   Temp 97.7 F (36.5 C) (Oral)   Resp 18   Ht 5\' 11"  (1.803 m)   Wt 77 kg   SpO2 91%   BMI 23.68 kg/m  SpO2: SpO2: 91 % O2 Device: O2 Device: Room Air O2 Flow Rate:    Intake/output summary:  Intake/Output Summary (Last 24 hours) at 01/03/2023 1746 Last data filed at 01/03/2023 1300 Gross per 24 hour  Intake 1468.64 ml  Output 2300 ml  Net -831.36 ml    LBM: Last BM Date : 01/03/23     Palliative Assessment/Data: ***     Palliative Medicine Assessment & Plan    Assessment: Principal Problem:   Pulmonary embolism (HCC) Active Problems:   History of stroke   Aneurysm of ascending aorta without rupture (HCC)   Essential hypertension   Type 2 diabetes mellitus with other specified complication (HCC)   Malignant neoplasm of unspecified part of unspecified bronchus or lung (HCC)   Metastatic cancer to liver (HCC)   AKI (acute kidney injury) (HCC)   Anorexia   Hypercalcemia    Recommendations/Plan: Code  status changed to DNR with limited interventions  Goals of Care and Additional Recommendations: Limitations on Scope of Treatment: {Recommended Scope and Preferences:21019}  Code Status:   Prognosis:  {Palliative Care Prognosis:23504}  Discharge Planning: {Palliative dispostion:23505}  Care plan was discussed with ***  Thank you for allowing the Palliative Medicine Team to assist in the care of this patient.   ***   Merry Proud, NP   Please contact Palliative Medicine Team phone at [phone] for questions and concerns.  For individual providers, please see AMION.

## 2023-01-03 NOTE — Progress Notes (Signed)
Call to radiation to see if patient was going to have radiation in the morning or afternoon of 01/04/23.  This RN spoke with Gavin Dennis. Plan is to discharge to home on 9/5. Pt's daughter Jacolyn Reedy) will be picking patient up on 01/04/23. Daughter also asked if radiation appointments could be moved to an earlier time of day due to her work schedule.

## 2023-01-03 NOTE — Progress Notes (Signed)
Call back from [facility] in radiation. [facility]'s radiation will be at  0815 in am and they will make a note of the need for early morning radiation appointments. Call back to [facility] , daughter, by this RN to update. No answer - voice mail left. I made her aware that the radiation dept. Would have a schedule  for him tomorrow and they would do their best to have the appointments earlier in the am. There is only one machine that can do the type of radiation that Daray needs. RN will attempt to call back later on today .

## 2023-01-03 NOTE — TOC Initial Note (Addendum)
Transition of Care [facility]) - Initial/Assessment Note    Patient Details  Name: Gavin Dennis MRN: [id number] Date of Birth: [birth year]  [facility] CM/SW Contact:    Lanier Clam, RN Phone Number: 01/03/2023, 9:38 AM  Clinical Narrative:  Spoke to dtr Arlalarki about d/c plans-referred to [facility] in community PTA.  Patient was hospitalized prior home visit-referral placed today.-they will follow. Referral to [facility] to contact dtr for assessment.[employer] for 3n1. HHPT-no preference will check w/HHC agency. Getting xrt today.Has own transport. home.     -9:52a [employer] rep Kandee Keen following for HHPT.        Expected Discharge Plan: Home w Home Health Services Barriers to Discharge: Continued Medical Work up   Patient Goals and CMS Choice Patient states their goals for this hospitalization and ongoing recovery are:: Home CMS Medicare.gov Compare Post Acute Care list provided to:: Patient Represenative (must comment) (Alalarki(dtr)) Choice offered to / list presented to : Adult Children [place] ownership interest in [facility].provided to:: Adult Children    Expected Discharge Plan and Services   Discharge Planning Services: CM Consult Post Acute Care Choice: Home Health Living arrangements for the past 2 months: Single Family Home                 DME Arranged: 3-N-1 DME Agency: [employer] Date DME Agency Contacted: 01/03/23 Time DME Agency Contacted: [phone] Representative spoke with at DME Agency: Vaughan Basta HH Arranged: PT          Prior Living Arrangements/Services Living arrangements for the past 2 months: Single Family Home Lives with:: Adult Children Patient language and need for interpreter reviewed:: Yes        Need for Family Participation in Patient Care: Yes (Comment) Care giver support system in place?: Yes (comment) Current home services: DME, Other (comment) (rollator;[facility] was referred but no soc since patient was  hospitalized.) Criminal Activity/Legal Involvement Pertinent to Current Situation/Hospitalization: No - Comment as needed  Activities of Daily Living Home Assistive Devices/Equipment: Environmental consultant (specify type), Crutches (rollator) ADL Screening (condition at time of admission) Patient's cognitive ability adequate to safely complete daily activities?: Yes Is the patient deaf or have difficulty hearing?: No Does the patient have difficulty seeing, even when wearing glasses/contacts?: No Does the patient have difficulty concentrating, remembering, or making decisions?: No Patient able to express need for assistance with ADLs?: Yes Does the patient have difficulty dressing or bathing?: Yes Independently performs ADLs?: No Communication: Independent Dressing (OT): Needs assistance Is this a change from baseline?: Pre-admission baseline Grooming: Needs assistance Is this a change from baseline?: Pre-admission baseline Feeding: Independent Bathing: Needs assistance Is this a change from baseline?: Pre-admission baseline In/Out Bed: Needs assistance Is this a change from baseline?: Pre-admission baseline Walks in Home: Needs assistance Is this a change from baseline?: Pre-admission baseline Does the patient have difficulty walking or climbing stairs?: Yes Weakness of Legs: Both Weakness of Arms/Hands: Both  Permission Sought/Granted Permission sought to share information with : Case Manager Permission granted to share information with : Yes, Verbal Permission Granted  Share Information with NAME: Case Manager           Emotional Assessment Appearance:: Appears stated age Attitude/Demeanor/Rapport: Gracious Affect (typically observed): Accepting Orientation: : Oriented to Self, Oriented to Place, Oriented to  Time, Oriented to Situation Alcohol / Substance Use: Not Applicable Psych Involvement: No (comment)  Admission diagnosis:  Pulmonary embolism (HCC) [I26.99] Tachycardia  [R00.0] AKI (acute kidney injury) (HCC) [N17.9] Single subsegmental pulmonary  embolism without acute cor pulmonale (HCC) [I26.93] Patient Active Problem List   Diagnosis Date Noted   Hypercalcemia 12/30/2022   Anorexia 12/29/2022   Diarrhea 12/27/2022   AKI (acute kidney injury) (HCC) 12/27/2022   Pulmonary embolism (HCC) 12/27/2022   Pathologic thoracic fracture 12/09/2022   Metastatic cancer to liver (HCC) 12/09/2022   Neutropenia (HCC) 07/24/2022   Malignant neoplasm of unspecified part of unspecified bronchus or lung (HCC) 06/26/2022   Mediastinal lymphadenopathy 06/12/2022   Stroke (cerebrum) (HCC) 03/13/2022   Aneurysm of ascending aorta without rupture (HCC) 08/26/2021   Atherosclerosis of coronary artery without angina pectoris 08/26/2021   Cervical disc disorder 08/26/2021   Essential hypertension 08/26/2021   Hyperlipidemia 08/26/2021   Hardening of the aorta (main artery of the heart) (HCC) 08/26/2021   Hypomagnesemia 08/26/2021   Other intervertebral disc degeneration, lumbar region 08/26/2021   Pulmonary emphysema (HCC) 08/26/2021   Type 2 diabetes mellitus with other specified complication (HCC) 08/26/2021   History of stroke 07/10/2020   Visual disturbance    CVA (cerebral vascular accident) (HCC) 07/09/2020   PCP:  Ollen Bowl, MD Pharmacy:   [id number] - [place] - [address] [place] [address] Phone: [phone] Fax: [phone]     Social Determinants of Health (SDOH) Social History: SDOH Screenings   Food Insecurity: No Food Insecurity (12/27/2022)  Housing: Low Risk  (12/30/2022)  Recent Concern: Housing - Medium Risk (12/27/2022)  Transportation Needs: No Transportation Needs (12/27/2022)  Utilities: Not At Risk (12/27/2022)  Alcohol Screen: Low Risk  (07/29/2021)  Depression (PHQ2-9): Low Risk  (07/05/2022)  Financial Resource Strain: Low Risk  (12/30/2022)  Physical Activity:  Insufficiently Active (07/29/2021)  Social Connections: Moderately Integrated (07/29/2021)  Stress: No Stress Concern Present (07/29/2021)  Tobacco Use: Medium Risk (12/27/2022)   SDOH Interventions: Housing Interventions: Intervention Not Indicated Financial Strain Interventions: Intervention Not Indicated   Readmission Risk Interventions     No data to display

## 2023-01-04 ENCOUNTER — Other Ambulatory Visit: Payer: Self-pay

## 2023-01-04 ENCOUNTER — Ambulatory Visit
Admit: 2023-01-04 | Discharge: 2023-01-04 | Disposition: A | Discharge status: Home / Self Care | Payer: Medicare HMO | Attending: Radiation Oncology | Admitting: Radiation Oncology

## 2023-01-04 DIAGNOSIS — I2699 Other pulmonary embolism without acute cor pulmonale: Secondary | ICD-10-CM | POA: Diagnosis not present

## 2023-01-04 DIAGNOSIS — R630 Anorexia: Secondary | ICD-10-CM

## 2023-01-04 DIAGNOSIS — N179 Acute kidney failure, unspecified: Secondary | ICD-10-CM | POA: Diagnosis not present

## 2023-01-04 DIAGNOSIS — C3490 Malignant neoplasm of unspecified part of unspecified bronchus or lung: Secondary | ICD-10-CM | POA: Diagnosis not present

## 2023-01-04 DIAGNOSIS — C787 Secondary malignant neoplasm of liver and intrahepatic bile duct: Secondary | ICD-10-CM | POA: Diagnosis not present

## 2023-01-04 DIAGNOSIS — C7951 Secondary malignant neoplasm of bone: Secondary | ICD-10-CM | POA: Diagnosis not present

## 2023-01-04 LAB — RAD ONC ARIA SESSION SUMMARY
Course Elapsed Days: 1
Plan Fractions Treated to Date: 2
Plan Prescribed Dose Per Fraction: 3 Gy
Plan Total Fractions Prescribed: 10
Plan Total Prescribed Dose: 30 Gy
Reference Point Dosage Given to Date: 6 Gy
Reference Point Session Dosage Given: 3 Gy
Session Number: 2

## 2023-01-04 LAB — GLUCOSE, CAPILLARY
Glucose-Capillary: 94 mg/dL (ref 70–99)
Glucose-Capillary: 116 mg/dL — ABNORMAL HIGH (ref 70–99)
Glucose-Capillary: 136 mg/dL — ABNORMAL HIGH (ref 70–99)
Glucose-Capillary: 121 mg/dL — ABNORMAL HIGH (ref 70–99)

## 2023-01-04 MED ORDER — DEXAMETHASONE SODIUM PHOSPHATE 4 MG/ML IJ SOLN
4.0000 mg | Freq: Two times a day (BID) | INTRAMUSCULAR | Status: DC
  Administered 2023-01-04 – 2023-01-05 (×3): 4 mg via INTRAVENOUS
  Filled 2023-01-04 (×3): qty 1

## 2023-01-04 MED ORDER — SENNA 8.6 MG PO TABS
1.0000 | ORAL_TABLET | Freq: Every day | ORAL | Status: DC
  Administered 2023-01-05: 8.6 mg via ORAL
  Filled 2023-01-04 (×2): qty 1

## 2023-01-04 NOTE — Progress Notes (Signed)
PROGRESS NOTE    Gavin Dennis  XTG:[id number] DOB: 26-Oct-1950 DOA: 12/27/2022 PCP: Ollen Bowl, MD   Brief Narrative: 73yo male with h/o lung cancer undergoing treatment and chronic back pain who presented to the cancer center for routine appointment and was found to have tachycardia to 160. He was sent to the ER and evaluation showed acute PE. He was started on heparin infusion. Echo ordered. Improved HR (thought to be from SVT) with IVF. Marginal BPs, BP medications held. Also reported 3 weeks of diarrhea so C diff was ordered; however, patient reported no further diarrhea in the hospital and thinks this was related to Ensure. Oncology consulted, will see as an outpatient. He remains focused on aggressive treatment but will consult palliative care for assistance with pain management and GOC. Also with diffuse MSK lesions so will consult rad onc for possible radiation to relieve pain. Plan for initiation of radiation on 9/3.   Assessment & Plan:   Principal Problem:   Pulmonary embolism (HCC) Active Problems:   History of stroke   Aneurysm of ascending aorta without rupture (HCC)   Essential hypertension   Type 2 diabetes mellitus with other specified complication (HCC)   Malignant neoplasm of unspecified part of unspecified bronchus or lung (HCC)   Metastatic cancer to liver (HCC)   AKI (acute kidney injury) (HCC)   Anorexia   Hypercalcemia   Pulmonary embolism (HCC) Presumably acute given association with tachycardia, soft blood pressure in the setting of known malignancy Started on heparin infusion -> Eliquis on 8/31 He is able to maintain his saturation on room air. R heart strain was not seen on CT Patients with active cancer are at high risk (>8%/year) for recurrent VTE and should be maintained on indefinite AC.   Hypercalcemia-down to 9.7 with IV fluids. Pharmacy recommends holding Zometa for now since Ca++ <12   Anorexia Patient reports inability to eat - no n/v/d,  just does not want food Prior diarrhea with Ensure Will request nutrition consult, as he appears to have severe malnutrition Consider appetite stimulant but this is suboptimal with current VTE Will await recommendations from palliative care   AKI (acute kidney injury) (HCC) Associated with high anion gap acidosis This is likely multifactorial/prerenal due to low blood pressure from diarrhea as well as possibly from pulmonary embolism Resolved with hydration    Malignant neoplasm of unspecified part of unspecified bronchus or lung (HCC) Appears to have been lost to follow up since last appointment in 07/2022 Stage IIIb (T0 N2, M0) non-small cell lung cancer, adenocarcinoma presented with right hilar and mediastinal lymphadenopathy with no clear primary diagnosed in February 2024 CTA with progressive disease and diffuse mets that were also noted on CT on 12/09/2022 Dr. Arbutus Ped was consulted and saw patient on 8/30 He was offered palliative care/hospice but declined and wants to pursue chemoimmunotherapy He will f/u with oncology as an outpatient He has diffuse bony mets and Dr. Arbutus Ped also recommended consulting Rad Onc for possible radiation to help with his back pain Dr. Mitzi Hansen was consulted and will started treatment on 9/3 He reports that he does have transportation and will be able to get to/from the Cancer Center once his pain is controlled and he is able to walk PT consulted, recommends HHPT Palliative care is consulting, assisting with GOC as well as pain control Increase oxycodone Added lidocaine patch Pain control has been an ongoing issue.  In spite of increasing Dilaudid and oxycodone yesterday he still have not had a  bad night requiring multiple doses of Dilaudid.  Added Decadron 4 mg twice a day for bone pain.  Palliative care following. His pain is so severe he is unable to get out of bed.   Type 2 diabetes mellitus with other specified complication (HCC) Last A1c was 6.7,  good control Hold Glucophage Cover with sensitive-scale SSI    Essential hypertension Continue amlodipine and Toprol XL - both were held on presentation due to hypotension but BP has uptrended and so meds were restarted Will add prn IV hydralazine   Aneurysm of ascending aorta without rupture (HCC) 4.6 thoracic aneurysm, needs semi-annual imaging 3.6 infrarenal aneurysm, f/u q2 years   History of stroke Hold Plavix while on Heparin drip, likely ok to dc since he will need AC lifelong Continue Lipitor  Estimated body mass index is 23.68 kg/m as calculated from the following:   Height as of this encounter: 5\' 11"  (1.803 m).   Weight as of this encounter: 77 kg.  Consultants: Oncology Radiation Oncology Palliative care Nutrition   Procedures: None   Antibiotics: None  Subjective: Complains of 10 out of 10 pain with any movement has not gotten out of bed  Objective: Vitals:   01/03/23 1238 01/03/23 2047 01/04/23 0618 01/04/23 1325  BP: (!) 134/97 (!) 130/99 (!) 158/107 (!) 136/104  Pulse: 92 73 86 73  Resp: 18 19 19 16   Temp: 97.7 F (36.5 C) 98 F (36.7 C) 98.1 F (36.7 C) 97.6 F (36.4 C)  TempSrc: Oral Oral  Oral  SpO2: 91% 97% 93% 100%  Weight:      Height:        Intake/Output Summary (Last 24 hours) at 01/04/2023 1517 Last data filed at 01/04/2023 1300 Gross per 24 hour  Intake 0 ml  Output 2400 ml  Net -2400 ml   Filed Weights   12/27/22 1124  Weight: 77 kg    Examination:  General exam: Appears in moderate distress due to back pain Respiratory system: Clear to auscultation. Respiratory effort normal. Cardiovascular system: S1 & S2 heard, RRR. No JVD, murmurs, rubs, gallops or clicks. No pedal edema. Gastrointestinal system: Abdomen is nondistended, soft and nontender. No organomegaly or masses felt. Normal bowel sounds heard. Central nervous system: Alert and oriented. Extremities: No edema  Data Reviewed: I have personally reviewed following  labs and imaging studies  CBC: Recent Labs  Lab 12/29/22 0511 12/30/22 0547 12/31/22 0547 01/01/23 0505 01/03/23 0506  WBC 8.2 8.3 5.4 7.6 11.5*  NEUTROABS  --   --  4.8 6.4 9.0*  HGB 12.6* 12.2* 12.1* 11.5* 12.1*  HCT 38.1* 36.6* 36.4* 34.1* 36.5*  MCV 99.5 98.7 99.5 98.6 100.8*  PLT 149* 144* 150 169 179   Basic Metabolic Panel: Recent Labs  Lab 12/29/22 0511 12/30/22 0547 12/31/22 0547 01/01/23 0505 01/03/23 0506  NA 140 138 136 136 134*  K 3.9 3.7 4.1 4.1 3.6  CL 108 104 105 107 103  CO2 25 24 22 22 22   GLUCOSE 113* 94 155* 134* 123*  BUN 22 16 20  25* 21  CREATININE 0.88 0.76 0.88 1.01 0.73  CALCIUM 11.2* 11.0* 10.5* 10.2 9.7   GFR: Estimated Creatinine Clearance: 91.5 mL/min (by C-G formula based on SCr of 0.73 mg/dL). Liver Function Tests: No results for input(s): "AST", "ALT", "ALKPHOS", "BILITOT", "PROT", "ALBUMIN" in the last 168 hours. No results for input(s): "LIPASE", "AMYLASE" in the last 168 hours. No results for input(s): "AMMONIA" in the last 168 hours. Coagulation Profile: No  results for input(s): "INR", "PROTIME" in the last 168 hours.  Cardiac Enzymes: No results for input(s): "CKTOTAL", "CKMB", "CKMBINDEX", "TROPONINI" in the last 168 hours. BNP (last 3 results) No results for input(s): "PROBNP" in the last 8760 hours. HbA1C: No results for input(s): "HGBA1C" in the last 72 hours. CBG: Recent Labs  Lab 01/03/23 1133 01/03/23 1637 01/03/23 2046 01/04/23 0739 01/04/23 1144  GLUCAP 126* 156* 113* 94 116*   Lipid Profile: No results for input(s): "CHOL", "HDL", "LDLCALC", "TRIG", "CHOLHDL", "LDLDIRECT" in the last 72 hours. Thyroid Function Tests: No results for input(s): "TSH", "T4TOTAL", "FREET4", "T3FREE", "THYROIDAB" in the last 72 hours. Anemia Panel: No results for input(s): "VITAMINB12", "FOLATE", "FERRITIN", "TIBC", "IRON", "RETICCTPCT" in the last 72 hours. Sepsis Labs: No results for input(s): "PROCALCITON", "LATICACIDVEN" in  the last 168 hours.  Recent Results (from the past 240 hour(s))  Culture, blood (Routine X 2) w Reflex to ID Panel     Status: None   Collection Time: 12/27/22  8:14 PM   Specimen: BLOOD RIGHT ARM  Result Value Ref Range Status   Specimen Description   Final    BLOOD RIGHT ARM Performed at [facility], [address], [place], [place] [address]    Special Requests   Final    BOTTLES DRAWN AEROBIC AND ANAEROBIC Blood Culture adequate volume Performed at [facility], [address], [place], [place] [address]    Culture   Final    NO GROWTH 5 DAYS Performed at [facility], [address], [place], [place] [address]    Report Status 01/01/2023 FINAL  Final  Culture, blood (Routine X 2) w Reflex to ID Panel     Status: None   Collection Time: 12/27/22  8:18 PM   Specimen: BLOOD  Result Value Ref Range Status   Specimen Description BLOOD SITE NOT SPECIFIED  Final   Special Requests   Final    BOTTLES DRAWN AEROBIC ONLY Blood Culture adequate volume   Culture   Final    NO GROWTH 5 DAYS Performed at [facility], [address], [place], [place] [address]    Report Status 01/01/2023 FINAL  Final    Radiology Studies: No results found.   Scheduled Meds:  acetaminophen  1,000 mg Oral Q8H   amLODipine  10 mg Oral Daily   apixaban  10 mg Oral BID   Followed by   Melene Muller ON 01/06/2023] apixaban  5 mg Oral BID   atorvastatin  20 mg Oral q AM   dexamethasone (DECADRON) injection  4 mg Intravenous Q12H   insulin aspart  0-9 Units Subcutaneous TID WC   lidocaine  1 patch Transdermal Q24H   metoprolol succinate  25 mg Oral Daily   pantoprazole  40 mg Oral Daily   sodium chloride flush  3 mL Intravenous Q12H   Continuous Infusions:  lactated ringers 75 mL/hr at 01/04/23 0137     LOS: 8 days    Time spent: 38 min Alwyn Ren, MD 01/04/2023, 3:17 PM

## 2023-01-05 ENCOUNTER — Ambulatory Visit
Admit: 2023-01-05 | Discharge: 2023-01-05 | Disposition: A | Discharge status: Home / Self Care | Payer: Medicare HMO | Attending: Radiation Oncology

## 2023-01-05 ENCOUNTER — Other Ambulatory Visit: Payer: Self-pay

## 2023-01-05 DIAGNOSIS — C7951 Secondary malignant neoplasm of bone: Secondary | ICD-10-CM | POA: Diagnosis not present

## 2023-01-05 DIAGNOSIS — Z515 Encounter for palliative care: Secondary | ICD-10-CM | POA: Diagnosis not present

## 2023-01-05 DIAGNOSIS — C787 Secondary malignant neoplasm of liver and intrahepatic bile duct: Secondary | ICD-10-CM | POA: Diagnosis not present

## 2023-01-05 DIAGNOSIS — N179 Acute kidney failure, unspecified: Secondary | ICD-10-CM | POA: Diagnosis not present

## 2023-01-05 DIAGNOSIS — G893 Neoplasm related pain (acute) (chronic): Secondary | ICD-10-CM

## 2023-01-05 LAB — GLUCOSE, CAPILLARY
Glucose-Capillary: 142 mg/dL — ABNORMAL HIGH (ref 70–99)
Glucose-Capillary: 98 mg/dL (ref 70–99)
Glucose-Capillary: 163 mg/dL — ABNORMAL HIGH (ref 70–99)
Glucose-Capillary: 107 mg/dL — ABNORMAL HIGH (ref 70–99)

## 2023-01-05 LAB — RAD ONC ARIA SESSION SUMMARY
Course Elapsed Days: 2
Plan Fractions Treated to Date: 3
Plan Prescribed Dose Per Fraction: 3 Gy
Plan Total Fractions Prescribed: 10
Plan Total Prescribed Dose: 30 Gy
Reference Point Dosage Given to Date: 9 Gy
Reference Point Session Dosage Given: 3 Gy
Session Number: 3

## 2023-01-05 MED ORDER — POLYETHYLENE GLYCOL 3350 17 G PO PACK
17.0000 g | PACK | Freq: Every day | ORAL | Status: DC
  Administered 2023-01-05: 17 g via ORAL
  Filled 2023-01-05 (×2): qty 1

## 2023-01-05 MED ORDER — MIRTAZAPINE 15 MG PO TBDP
7.5000 mg | ORAL_TABLET | Freq: Every day | ORAL | Status: DC
  Administered 2023-01-05 – 2023-01-08 (×4): 7.5 mg via ORAL
  Filled 2023-01-05 (×4): qty 0.5

## 2023-01-05 MED ORDER — DEXAMETHASONE 4 MG PO TABS
4.0000 mg | ORAL_TABLET | Freq: Every day | ORAL | Status: DC
  Administered 2023-01-06 – 2023-01-08 (×3): 4 mg via ORAL
  Filled 2023-01-05 (×3): qty 1

## 2023-01-05 NOTE — Progress Notes (Signed)
Chaplain engaged in a follow-up visit for [employer], Healthcare POA. While document is completed, Chaplain could not find two witnesses for document. Chaplains will continue to try to have document notarized.   This chart note recognizes that Dilin has elected his oldest son and his daughter-in-law as his healthcare agents.   01/05/23 1100  Spiritual Encounters  Type of Visit Follow up  Reason for visit Advance directives

## 2023-01-05 NOTE — Progress Notes (Signed)
PROGRESS NOTE    Gavin Dennis  WGN:[id number] DOB: 09-Nov-1950 DOA: 12/27/2022 PCP: Ollen Bowl, MD   Brief Narrative: 72yo male with h/o lung cancer undergoing treatment and chronic back pain who presented to the cancer center for routine appointment and was found to have tachycardia to 160. He was sent to the ER and evaluation showed acute PE. He was started on heparin infusion. Echo ordered. Improved HR (thought to be from SVT) with IVF. Marginal BPs, BP medications held. Also reported 3 weeks of diarrhea so C diff was ordered; however, patient reported no further diarrhea in the hospital and thinks this was related to Ensure. Oncology consulted, will see as an outpatient. He remains focused on aggressive treatment but will consult palliative care for assistance with pain management and GOC. Also with diffuse MSK lesions so will consult rad onc for possible radiation to relieve pain. Plan for initiation of radiation on 9/3.   Assessment & Plan:   Principal Problem:   Pulmonary embolism (HCC) Active Problems:   History of stroke   Aneurysm of ascending aorta without rupture (HCC)   Essential hypertension   Type 2 diabetes mellitus with other specified complication (HCC)   Malignant neoplasm of unspecified part of unspecified bronchus or lung (HCC)   Metastatic cancer to liver (HCC)   AKI (acute kidney injury) (HCC)   Anorexia   Hypercalcemia   Pulmonary embolism (HCC) Presumably acute given association with tachycardia, soft blood pressure in the setting of known malignancy Started on heparin infusion -> Eliquis on 8/31 He is able to maintain his saturation on room air. R heart strain was not seen on CT Patients with active cancer are at high risk (>8%/year) for recurrent VTE and should be maintained on indefinite AC.   Hypercalcemia-down to 9.7 with IV fluids. Pharmacy recommends holding Zometa for now since Ca++ <12   Anorexia started Remeron 7.5 mg at bedtime 9/6   AKI  (acute kidney injury) (HCC) Associated with high anion gap acidosis This is likely multifactorial/prerenal due to low blood pressure from diarrhea as well as possibly from pulmonary embolism Resolved with hydration    Malignant neoplasm of unspecified part of unspecified bronchus or lung (HCC) Appears to have been lost to follow up since last appointment in 07/2022 Stage IIIb (T0 N2, M0) non-small cell lung cancer, adenocarcinoma presented with right hilar and mediastinal lymphadenopathy with no clear primary diagnosed in February 2024 CTA with progressive disease and diffuse mets that were also noted on CT on 12/09/2022 Dr. Arbutus Ped was consulted and saw patient on 8/30 He was offered palliative care/hospice but declined and wants to pursue chemoimmunotherapy He will f/u with oncology as an outpatient He has diffuse bony mets and Dr. Arbutus Ped also recommended consulting Rad Onc for possible radiation to help with his back pain Dr. Mitzi Hansen was consulted and will started treatment on 9/3 He reports that he does have transportation and will be able to get to/from the Cancer Center once his pain is controlled and he is able to walk PT consulted, recommends HHPT Palliative care is consulting, assisting with GOC as well as pain control Increase oxycodone Added lidocaine patch Pain control has been an ongoing issue.  In spite of increasing Dilaudid and oxycodone yesterday he still have not had a bad night requiring multiple doses of Dilaudid.   Change Decadron to 4 mg p.o. daily.     Type 2 diabetes mellitus with other specified complication (HCC) CBG (last 3)  Recent Labs  01/04/23 1715 01/04/23 2105 01/05/23 0745  GLUCAP 136* 121* 142*    Last A1c was 6.7, good control Hold Glucophage Cover with sensitive-scale SSI    Essential hypertension Continue amlodipine and Toprol XL - both were held on presentation due to hypotension but BP has uptrended and so meds were restarted Will add prn  IV hydralazine   Aneurysm of ascending aorta without rupture (HCC) 4.6 thoracic aneurysm, needs semi-annual imaging 3.6 infrarenal aneurysm, f/u q2 years   History of stroke Hold Plavix while on Heparin drip, likely ok to dc since he will need AC lifelong Continue Lipitor  Estimated body mass index is 23.68 kg/m as calculated from the following:   Height as of this encounter: 5\' 11"  (1.803 m).   Weight as of this encounter: 77 kg.  Consultants: Oncology Radiation Oncology Palliative care Nutrition   Procedures: None   Antibiotics: None  Subjective:   C/o severe pain  Not finding a comfortable position  Received dilaudid Had bm  Objective: Vitals:   01/05/23 0509 01/05/23 0738 01/05/23 0951 01/05/23 0952  BP: (!) 140/104   (!) 138/98  Pulse: (!) 102   98  Resp: 18 20 20    Temp: 98.2 F (36.8 C)     TempSrc: Oral     SpO2: 100%     Weight:      Height:        Intake/Output Summary (Last 24 hours) at 01/05/2023 1048 Last data filed at 01/05/2023 0512 Gross per 24 hour  Intake 60 ml  Output 2000 ml  Net -1940 ml   Filed Weights   12/27/22 1124  Weight: 77 kg    Examination:  General exam: Appears in moderate distress due to back pain Respiratory system: Clear to auscultation. Respiratory effort normal. Cardiovascular system: S1 & S2 heard, RRR. No JVD, murmurs, rubs, gallops or clicks. No pedal edema. Gastrointestinal system: Abdomen is nondistended, soft and nontender. No organomegaly or masses felt. Normal bowel sounds heard. Central nervous system: Alert and oriented. Extremities: No edema  Data Reviewed: I have personally reviewed following labs and imaging studies  CBC: Recent Labs  Lab 12/30/22 0547 12/31/22 0547 01/01/23 0505 01/03/23 0506  WBC 8.3 5.4 7.6 11.5*  NEUTROABS  --  4.8 6.4 9.0*  HGB 12.2* 12.1* 11.5* 12.1*  HCT 36.6* 36.4* 34.1* 36.5*  MCV 98.7 99.5 98.6 100.8*  PLT 144* 150 169 179   Basic Metabolic Panel: Recent Labs   Lab 12/30/22 0547 12/31/22 0547 01/01/23 0505 01/03/23 0506  NA 138 136 136 134*  K 3.7 4.1 4.1 3.6  CL 104 105 107 103  CO2 24 22 22 22   GLUCOSE 94 155* 134* 123*  BUN 16 20 25* 21  CREATININE 0.76 0.88 1.01 0.73  CALCIUM 11.0* 10.5* 10.2 9.7   GFR: Estimated Creatinine Clearance: 91.5 mL/min (by C-G formula based on SCr of 0.73 mg/dL). Liver Function Tests: No results for input(s): "AST", "ALT", "ALKPHOS", "BILITOT", "PROT", "ALBUMIN" in the last 168 hours. No results for input(s): "LIPASE", "AMYLASE" in the last 168 hours. No results for input(s): "AMMONIA" in the last 168 hours. Coagulation Profile: No results for input(s): "INR", "PROTIME" in the last 168 hours.  Cardiac Enzymes: No results for input(s): "CKTOTAL", "CKMB", "CKMBINDEX", "TROPONINI" in the last 168 hours. BNP (last 3 results) No results for input(s): "PROBNP" in the last 8760 hours. HbA1C: No results for input(s): "HGBA1C" in the last 72 hours. CBG: Recent Labs  Lab 01/04/23 0739 01/04/23 1144 01/04/23 1715  01/04/23 2105 01/05/23 0745  GLUCAP 94 116* 136* 121* 142*   Lipid Profile: No results for input(s): "CHOL", "HDL", "LDLCALC", "TRIG", "CHOLHDL", "LDLDIRECT" in the last 72 hours. Thyroid Function Tests: No results for input(s): "TSH", "T4TOTAL", "FREET4", "T3FREE", "THYROIDAB" in the last 72 hours. Anemia Panel: No results for input(s): "VITAMINB12", "FOLATE", "FERRITIN", "TIBC", "IRON", "RETICCTPCT" in the last 72 hours. Sepsis Labs: No results for input(s): "PROCALCITON", "LATICACIDVEN" in the last 168 hours.  Recent Results (from the past 240 hour(s))  Culture, blood (Routine X 2) w Reflex to ID Panel     Status: None   Collection Time: 12/27/22  8:14 PM   Specimen: BLOOD RIGHT ARM  Result Value Ref Range Status   Specimen Description   Final    BLOOD RIGHT ARM Performed at [facility], [address], [place], [place] [address]    Special Requests   Final    BOTTLES DRAWN  AEROBIC AND ANAEROBIC Blood Culture adequate volume Performed at [facility], [address] [place], [place] [address]    Culture   Final    NO GROWTH 5 DAYS Performed at [facility], [address], [place], [place] [address]    Report Status 01/01/2023 FINAL  Final  Culture, blood (Routine X 2) w Reflex to ID Panel     Status: None   Collection Time: 12/27/22  8:18 PM   Specimen: BLOOD  Result Value Ref Range Status   Specimen Description BLOOD SITE NOT SPECIFIED  Final   Special Requests   Final    BOTTLES DRAWN AEROBIC ONLY Blood Culture adequate volume   Culture   Final    NO GROWTH 5 DAYS Performed at [facility], [address], [place], [place] [address]    Report Status 01/01/2023 FINAL  Final    Radiology Studies: No results found.   Scheduled Meds:  acetaminophen  1,000 mg Oral Q8H   amLODipine  10 mg Oral Daily   apixaban  10 mg Oral BID   Followed by   Melene Muller ON 01/06/2023] apixaban  5 mg Oral BID   atorvastatin  20 mg Oral q AM   dexamethasone (DECADRON) injection  4 mg Intravenous Q12H   insulin aspart  0-9 Units Subcutaneous TID WC   lidocaine  1 patch Transdermal Q24H   metoprolol succinate  25 mg Oral Daily   pantoprazole  40 mg Oral Daily   senna  1 tablet Oral Daily   sodium chloride flush  3 mL Intravenous Q12H   Continuous Infusions:  lactated ringers 75 mL/hr at 01/05/23 0502     LOS: 9 days    Time spent: 38 min Alwyn Ren, MD 01/05/2023, 10:48 AM

## 2023-01-05 NOTE — Progress Notes (Signed)
Daily Progress Note   Patient Name: Dalton Curtiss       Date: 01/05/2023 DOB: 30-Jul-1950  Age: 72 y.o. MRN#: [id number] Attending Physician: Alwyn Ren, MD Primary Care Physician: Ollen Bowl, MD Admit Date: 12/27/2022  Reason for Consultation/Follow-up: Establishing goals of care  Patient Profile/HPI:  Sinai Rivenburgh is a 72 y.o. male  with metastatic non-small cell lung cancer diagnosed in February 2024.  He underwent a course of concurrent chemoradiation with partial response, but then was lost to follow-up.     He presented to the ED on 12/09/2022 with back pain.  Imaging showed evidence of progressive lung cancer (from December PET scan), with skeletal metastatic disease and new liver metastases. He was discharged home with pain medication.    He was seen in the oncology office 12/27/2022 for re-evaluation and discussion of treatment options but was found to have tachycardia and was subsequently admitted to [facility] with SVT.  CTA showed pulmonary embolism.  He was seen by Dr. Arbutus Ped on 8/30, and at this time was interested in additional treatment with no intention to consider hospice.    Radiation oncology was consulted for possible radiation to relieve pain from diffuse osseous lesions.    Palliative Medicine was consulted for goals of care  Subjective: Patient sitting up awake and alert. Reports pain 9/10, but notes he just received pain medication and it has been helping.  He slept well last night.   Review of Systems  Musculoskeletal:  Positive for back pain.     Physical Exam Vitals and nursing note reviewed.  Constitutional:      Appearance: He is ill-appearing.  Neurological:     Mental Status: He is alert and oriented to person, place, and time.  Psychiatric:         Mood and Affect: Mood normal.             Vital Signs: BP (!) 138/98   Pulse 98   Temp 98.2 F (36.8 C) (Oral)   Resp 20   Ht 5\' 11"  (1.803 m)   Wt 77 kg   SpO2 100%   BMI 23.68 kg/m  SpO2: SpO2: 100 % O2 Device: O2 Device: Room Air O2 Flow Rate:    Intake/output summary:  Intake/Output Summary (Last 24 hours) at 01/05/2023 1039 Last data filed  at 01/05/2023 6063 Gross per 24 hour  Intake 60 ml  Output 2000 ml  Net -1940 ml   LBM: Last BM Date : 01/04/23 Baseline Weight: Weight: 77 kg Most recent weight: Weight: 77 kg       Palliative Assessment/Data: PPS: 30 %      Patient Active Problem List   Diagnosis Date Noted   Hypercalcemia 12/30/2022   Anorexia 12/29/2022   Diarrhea 12/27/2022   AKI (acute kidney injury) (HCC) 12/27/2022   Pulmonary embolism (HCC) 12/27/2022   Pathologic thoracic fracture 12/09/2022   Metastatic cancer to liver (HCC) 12/09/2022   Neutropenia (HCC) 07/24/2022   Malignant neoplasm of unspecified part of unspecified bronchus or lung (HCC) 06/26/2022   Mediastinal lymphadenopathy 06/12/2022   Stroke (cerebrum) (HCC) 03/13/2022   Aneurysm of ascending aorta without rupture (HCC) 08/26/2021   Atherosclerosis of coronary artery without angina pectoris 08/26/2021   Cervical disc disorder 08/26/2021   Essential hypertension 08/26/2021   Hyperlipidemia 08/26/2021   Hardening of the aorta (main artery of the heart) (HCC) 08/26/2021   Hypomagnesemia 08/26/2021   Other intervertebral disc degeneration, lumbar region 08/26/2021   Pulmonary emphysema (HCC) 08/26/2021   Type 2 diabetes mellitus with other specified complication (HCC) 08/26/2021   History of stroke 07/10/2020   Visual disturbance    CVA (cerebral vascular accident) (HCC) 07/09/2020    Palliative Care Assessment & Plan    Assessment/Recommendations/Plan  Continue current plan of care No changes to pain regimen today- recommend changing dexamethasone to po    Code  Status: DNR  Prognosis:  Unable to determine  Discharge Planning: To Be Determined  Care plan was discussed with patient and care team.   Thank you for allowing the Palliative Medicine Team to assist in the care of this patient.  Greater than 50%  of this time was spent counseling and coordinating care related to the above assessment and plan.  Ocie Bob, AGNP-C Palliative Medicine   Please contact Palliative Medicine Team phone at [phone] for questions and concerns.

## 2023-01-05 NOTE — Plan of Care (Signed)
  Problem: Education: Goal: Knowledge of General Education information will improve Description: Including pain rating scale, medication(s)/side effects and non-pharmacologic comfort measures Outcome: Progressing   Problem: Health Behavior/Discharge Planning: Goal: Ability to manage health-related needs will improve Outcome: Progressing   Problem: Clinical Measurements: Goal: Ability to maintain clinical measurements within normal limits will improve Outcome: Progressing Goal: Will remain free from infection Outcome: Progressing Goal: Diagnostic test results will improve Outcome: Progressing Goal: Respiratory complications will improve Outcome: Progressing Goal: Cardiovascular complication will be avoided Outcome: Progressing   Problem: Nutrition: Goal: Adequate nutrition will be maintained Outcome: Progressing   Problem: Coping: Goal: Level of anxiety will decrease Outcome: Progressing   Problem: Elimination: Goal: Will not experience complications related to bowel motility Outcome: Progressing Goal: Will not experience complications related to urinary retention Outcome: Progressing   Problem: Pain Managment: Goal: General experience of comfort will improve Outcome: Progressing   Problem: Safety: Goal: Ability to remain free from injury will improve Outcome: Progressing   Problem: Coping: Goal: Ability to adjust to condition or change in health will improve Outcome: Progressing   Problem: Fluid Volume: Goal: Ability to maintain a balanced intake and output will improve Outcome: Progressing   Problem: Health Behavior/Discharge Planning: Goal: Ability to identify and utilize available resources and services will improve Outcome: Progressing Goal: Ability to manage health-related needs will improve Outcome: Progressing   Problem: Metabolic: Goal: Ability to maintain appropriate glucose levels will improve Outcome: Progressing   Problem: Nutritional: Goal:  Maintenance of adequate nutrition will improve Outcome: Progressing Goal: Progress toward achieving an optimal weight will improve Outcome: Progressing   Problem: Tissue Perfusion: Goal: Adequacy of tissue perfusion will improve Outcome: Progressing

## 2023-01-06 DIAGNOSIS — R000 Tachycardia, unspecified: Principal | ICD-10-CM

## 2023-01-06 LAB — CBC
HCT: 37.9 % — ABNORMAL LOW (ref 39.0–52.0)
HCT: 39.2 % (ref 39.0–52.0)
Hemoglobin: 13 g/dL (ref 13.0–17.0)
Hemoglobin: 13.4 g/dL (ref 13.0–17.0)
MCH: 33 pg (ref 26.0–34.0)
MCH: 33.3 pg (ref 26.0–34.0)
MCHC: 34.3 g/dL (ref 30.0–36.0)
MCHC: 34.2 g/dL (ref 30.0–36.0)
MCV: 96.2 fL (ref 80.0–100.0)
MCV: 97.3 fL (ref 80.0–100.0)
Platelets: 164 10*3/uL (ref 150–400)
Platelets: 168 10*3/uL (ref 150–400)
RBC: 3.94 MIL/uL — ABNORMAL LOW (ref 4.22–5.81)
RBC: 4.03 MIL/uL — ABNORMAL LOW (ref 4.22–5.81)
RDW: 14.6 % (ref 11.5–15.5)
RDW: 14.8 % (ref 11.5–15.5)
WBC: 11 10*3/uL — ABNORMAL HIGH (ref 4.0–10.5)
WBC: 10.1 10*3/uL (ref 4.0–10.5)
nRBC: 0 % (ref 0.0–0.2)
nRBC: 0.2 % (ref 0.0–0.2)

## 2023-01-06 LAB — COMPREHENSIVE METABOLIC PANEL
ALT: 24 U/L (ref 0–44)
AST: 13 U/L — ABNORMAL LOW (ref 15–41)
Albumin: 2.7 g/dL — ABNORMAL LOW (ref 3.5–5.0)
Alkaline Phosphatase: 97 U/L (ref 38–126)
Anion gap: 10 (ref 5–15)
BUN: 22 mg/dL (ref 8–23)
CO2: 24 mmol/L (ref 22–32)
Calcium: 9.8 mg/dL (ref 8.9–10.3)
Chloride: 102 mmol/L (ref 98–111)
Creatinine, Ser: 0.76 mg/dL (ref 0.61–1.24)
GFR, Estimated: >60 mL/min (ref >60)
Glucose, Bld: 134 mg/dL — ABNORMAL HIGH (ref 70–99)
Potassium: 4 mmol/L (ref 3.5–5.1)
Sodium: 136 mmol/L (ref 135–145)
Total Bilirubin: 1.6 mg/dL — ABNORMAL HIGH (ref 0.3–1.2)
Total Protein: 5.7 g/dL — ABNORMAL LOW (ref 6.5–8.1)

## 2023-01-06 LAB — GLUCOSE, CAPILLARY
Glucose-Capillary: 126 mg/dL — ABNORMAL HIGH (ref 70–99)
Glucose-Capillary: 90 mg/dL (ref 70–99)
Glucose-Capillary: 165 mg/dL — ABNORMAL HIGH (ref 70–99)
Glucose-Capillary: 145 mg/dL — ABNORMAL HIGH (ref 70–99)

## 2023-01-06 NOTE — Plan of Care (Signed)
  Problem: Safety: Goal: Ability to remain free from injury will improve Outcome: Progressing   

## 2023-01-06 NOTE — Progress Notes (Signed)
PROGRESS NOTE    Gavin Dennis  ZOX:[id number] DOB: [birth year] DOA: 12/27/2022 PCP: Ollen Bowl, MD   Brief Narrative: 72yo male with h/o lung cancer undergoing treatment and chronic back pain who presented to the cancer center for routine appointment and was found to have tachycardia to 160. He was sent to the ER and evaluation showed acute PE. He was started on heparin infusion. Echo ordered. Improved HR (thought to be from SVT) with IVF. Marginal BPs, BP medications held. Also reported 3 weeks of diarrhea so C diff was ordered; however, patient reported no further diarrhea in the hospital and thinks this was related to Ensure. Oncology consulted, will see as an outpatient. He remains focused on aggressive treatment but will consult palliative care for assistance with pain management and GOC. Also with diffuse MSK lesions so will consult rad onc for possible radiation to relieve pain. Plan for initiation of radiation on 9/3.   Assessment & Plan:   Principal Problem:   Pulmonary embolism (HCC) Active Problems:   History of stroke   Aneurysm of ascending aorta without rupture (HCC)   Essential hypertension   Type 2 diabetes mellitus with other specified complication (HCC)   Malignant neoplasm of unspecified part of unspecified bronchus or lung (HCC)   Metastatic cancer to liver (HCC)   AKI (acute kidney injury) (HCC)   Anorexia   Hypercalcemia   Pulmonary embolism (HCC) Started on heparin infusion -> Eliquis on 8/31 He is able to maintain his saturation on room air. R heart strain was not seen on CT Patients with active cancer are at high risk (>8%/year) for recurrent VTE and should be maintained on indefinite AC.   Hypercalcemia-down to 9.7 with IV fluids. Pharmacy recommends holding Zometa for now since Ca++ <12   Anorexia started Remeron 7.5 mg at bedtime 9/6   AKI (acute kidney injury) (HCC) Associated with high anion gap acidosis This is likely multifactorial/prerenal due  to low blood pressure from diarrhea  Resolved with hydration   Malignant neoplasm of unspecified part of unspecified bronchus or lung (HCC) Appears to have been lost to follow up since last appointment in 07/2022 Stage IIIb (T0 N2, M0) non-small cell lung cancer, adenocarcinoma presented with right hilar and mediastinal lymphadenopathy with no clear primary diagnosed in February 2024 CTA with progressive disease and diffuse mets that were also noted on CT on 12/09/2022 Dr. Arbutus Ped was consulted and saw patient on 8/30 He was offered palliative care/hospice but declined and wants to pursue chemoimmunotherapy He will f/u with oncology as an outpatient He has diffuse bony mets and Dr. Arbutus Ped also recommended consulting Rad Onc for possible radiation to help with his back pain Dr. Mitzi Hansen was consulted and will started treatment on 9/3 He reports that he does have transportation and will be able to get to/from the Cancer Center once his pain is controlled and he is able to walk PT consulted, recommends HHPT Palliative care is consulting, assisting with GOC as well as pain control Oxycodone dose was increased, Decadron was added, lidocaine patch was added. He now has diarrhea we will hold stool softeners laxatives.    Type 2 diabetes mellitus with other specified complication (HCC) CBG (last 3)  Recent Labs    01/05/23 2249 01/06/23 0818 01/06/23 1138  GLUCAP 107* 126* 90   Last A1c was 6.7, good control Hold Glucophage Cover with sensitive-scale SSI    Essential hypertension on amlodipine and Toprol-XL continue. As needed hydralazine  Aneurysm of ascending aorta without  rupture (HCC) 4.6 thoracic aneurysm, needs semi-annual imaging 3.6 infrarenal aneurysm, f/u q2 years   History of stroke Hold Plavix while on Heparin drip, likely ok to dc since he will need AC lifelong Continue Lipitor  Estimated body mass index is 23.68 kg/m as calculated from the following:   Height as of this  encounter: 5\' 11"  (1.803 m).   Weight as of this encounter: 77 kg.  Consultants: Oncology Radiation Oncology Palliative care Nutrition   Procedures: None   Antibiotics: None  Subjective:  He is moving around better in bed per staff.  He had diarrhea all night long per staff.  He starts to get confused in the later part of the day every day.  Objective: Vitals:   01/05/23 2024 01/06/23 0518 01/06/23 0957 01/06/23 1042  BP: 123/88 (!) 139/92 132/78   Pulse: 100 73    Resp: 18 18 20 17   Temp: 97.9 F (36.6 C) 98.1 F (36.7 C)    TempSrc: Oral Oral    SpO2: 97% 97%    Weight:      Height:        Intake/Output Summary (Last 24 hours) at 01/06/2023 1316 Last data filed at 01/06/2023 0500 Gross per 24 hour  Intake 1140 ml  Output 850 ml  Net 290 ml   Filed Weights   12/27/22 1124  Weight: 77 kg    Examination:  General exam: Appears in moderate distress due to back pain Respiratory system: Clear to auscultation. Respiratory effort normal. Cardiovascular system: S1 & S2 heard, RRR. No JVD, murmurs, rubs, gallops or clicks. No pedal edema. Gastrointestinal system: Abdomen is nondistended, soft and nontender. No organomegaly or masses felt. Normal bowel sounds heard. Central nervous system: Alert and oriented. Extremities: No edema  Data Reviewed: I have personally reviewed following labs and imaging studies  CBC: Recent Labs  Lab 12/31/22 0547 01/01/23 0505 01/03/23 0506 01/06/23 0458  WBC 5.4 7.6 11.5* 11.0*  NEUTROABS 4.8 6.4 9.0*  --   HGB 12.1* 11.5* 12.1* 13.0  HCT 36.4* 34.1* 36.5* 37.9*  MCV 99.5 98.6 100.8* 96.2  PLT 150 169 179 164   Basic Metabolic Panel: Recent Labs  Lab 12/31/22 0547 01/01/23 0505 01/03/23 0506  NA 136 136 134*  K 4.1 4.1 3.6  CL 105 107 103  CO2 22 22 22   GLUCOSE 155* 134* 123*  BUN 20 25* 21  CREATININE 0.88 1.01 0.73  CALCIUM 10.5* 10.2 9.7   GFR: Estimated Creatinine Clearance: 91.5 mL/min (by C-G formula based  on SCr of 0.73 mg/dL). Liver Function Tests: No results for input(s): "AST", "ALT", "ALKPHOS", "BILITOT", "PROT", "ALBUMIN" in the last 168 hours. No results for input(s): "LIPASE", "AMYLASE" in the last 168 hours. No results for input(s): "AMMONIA" in the last 168 hours. Coagulation Profile: No results for input(s): "INR", "PROTIME" in the last 168 hours.  Cardiac Enzymes: No results for input(s): "CKTOTAL", "CKMB", "CKMBINDEX", "TROPONINI" in the last 168 hours. BNP (last 3 results) No results for input(s): "PROBNP" in the last 8760 hours. HbA1C: No results for input(s): "HGBA1C" in the last 72 hours. CBG: Recent Labs  Lab 01/05/23 1257 01/05/23 1704 01/05/23 2249 01/06/23 0818 01/06/23 1138  GLUCAP 98 163* 107* 126* 90   Lipid Profile: No results for input(s): "CHOL", "HDL", "LDLCALC", "TRIG", "CHOLHDL", "LDLDIRECT" in the last 72 hours. Thyroid Function Tests: No results for input(s): "TSH", "T4TOTAL", "FREET4", "T3FREE", "THYROIDAB" in the last 72 hours. Anemia Panel: No results for input(s): "VITAMINB12", "FOLATE", "FERRITIN", "TIBC", "  IRON", "RETICCTPCT" in the last 72 hours. Sepsis Labs: No results for input(s): "PROCALCITON", "LATICACIDVEN" in the last 168 hours.  Recent Results (from the past 240 hour(s))  Culture, blood (Routine X 2) w Reflex to ID Panel     Status: None   Collection Time: 12/27/22  8:14 PM   Specimen: BLOOD RIGHT ARM  Result Value Ref Range Status   Specimen Description   Final    BLOOD RIGHT ARM Performed at [facility], [address] [place], [place] [address]    Special Requests   Final    BOTTLES DRAWN AEROBIC AND ANAEROBIC Blood Culture adequate volume Performed at [facility], [address], [place], [place] [address]    Culture   Final    NO GROWTH 5 DAYS Performed at [facility], [address], [place], [place] [address]    Report Status 01/01/2023 FINAL  Final  Culture, blood (Routine X 2) w  Reflex to ID Panel     Status: None   Collection Time: 12/27/22  8:18 PM   Specimen: BLOOD  Result Value Ref Range Status   Specimen Description BLOOD SITE NOT SPECIFIED  Final   Special Requests   Final    BOTTLES DRAWN AEROBIC ONLY Blood Culture adequate volume   Culture   Final    NO GROWTH 5 DAYS Performed at [facility], [address] [place], [place] [address]    Report Status 01/01/2023 FINAL  Final    Radiology Studies: No results found.   Scheduled Meds:  acetaminophen  1,000 mg Oral Q8H   amLODipine  10 mg Oral Daily   apixaban  5 mg Oral BID   atorvastatin  20 mg Oral q AM   dexamethasone  4 mg Oral Daily   insulin aspart  0-9 Units Subcutaneous TID WC   lidocaine  1 patch Transdermal Q24H   metoprolol succinate  25 mg Oral Daily   mirtazapine  7.5 mg Oral QHS   pantoprazole  40 mg Oral Daily   polyethylene glycol  17 g Oral Daily   senna  1 tablet Oral Daily   sodium chloride flush  3 mL Intravenous Q12H   Continuous Infusions:  lactated ringers 75 mL/hr at 01/05/23 1958     LOS: 10 days    Time spent: 38 min Alwyn Ren, MD 01/06/2023, 1:16 PM

## 2023-01-06 NOTE — Progress Notes (Signed)
A consult was placed at 0620 for new IV access;  pt was seen by the IV Nurse at 0630 but was asked to return due to pt care;  pt receiving pt care again now, at 0855;  will return later this shift to restart the IV;

## 2023-01-06 NOTE — Plan of Care (Signed)
  Problem: Clinical Measurements: Goal: Will remain free from infection Outcome: Progressing Goal: Respiratory complications will improve Outcome: Progressing Goal: Cardiovascular complication will be avoided Outcome: Progressing   Problem: Coping: Goal: Level of anxiety will decrease Outcome: Progressing   Problem: Elimination: Goal: Will not experience complications related to urinary retention Outcome: Progressing   

## 2023-01-06 NOTE — Progress Notes (Addendum)
Physical Therapy Treatment Patient Details Name: Gavin Dennis MRN: [id number] DOB: [birth year] Today's Date: 01/06/2023   History of Present Illness 72yo male with h/o lung cancer with diffuse bony mets undergoing treatment and chronic back pain who presented to the cancer center for routine appointment and was found to have tachycardia to 160.  He was sent to the ER and evaluation showed acute PE. PMH includes CVA 2022 and 03/2022, DM, HTN. CT 12/09/22 showed T10 and T12 compression fracture deformities.    PT Comments  Pt initially agreeable to working with therapy upon therapist's entry to room. After brief discussion, pt reported pain 10/10 in back (pre-medicated prior to PT arrival). He did not feel the medicine had helped any yet. He feels he may need something stronger for pain-encouraged pt to continue to have open discussions with nursing/MD about pain control regimen. He did allow me to briefly assess his UEs and LEs-moving them well-denied pain in extremities. He politely declined any further participation with therapy at this time 2* pain. Will continue to follow and progress activity as pt is able to tolerate.     If plan is discharge home, recommend the following: A lot of help with walking and/or transfers;A lot of help with bathing/dressing/bathroom;Assistance with cooking/housework;Assist for transportation;Help with stairs or ramp for entrance   Can travel by private vehicle        Equipment Recommendations       Recommendations for Other Services       Precautions / Restrictions Precautions Precautions: Fall Precaution Comments: pt denies falls in past 6 months Restrictions Weight Bearing Restrictions: No     Mobility  Bed Mobility               General bed mobility comments: Deferred at pt's request 2* pain    Transfers                        Ambulation/Gait                   Stairs             Wheelchair Mobility     Tilt  Bed    Modified Rankin (Stroke Patients Only)       Balance                                            Cognition Arousal: Alert Behavior During Therapy: WFL for tasks assessed/performed Overall Cognitive Status: Within Functional Limits for tasks assessed                                          Exercises General Exercises - Lower Extremity Ankle Circles/Pumps: AROM, Both, 3 reps Heel Slides: AROM (1 rep) Straight Leg Raises: AROM, Both (1 rep) Good grip strength bilaterally. Moving UEs well.    General Comments        Pertinent Vitals/Pain Pain Assessment Pain Assessment: 0-10 Pain Score: 10-Worst pain ever Pain Location: back Pain Intervention(s): Premedicated before session    Home Living                          Prior Function  PT Goals (current goals can now be found in the care plan section)      Frequency    Min 1X/week      PT Plan      Co-evaluation              AM-PAC PT "6 Clicks" Mobility   Outcome Measure  Help needed turning from your back to your side while in a flat bed without using bedrails?: A Lot Help needed moving from lying on your back to sitting on the side of a flat bed without using bedrails?: A Lot Help needed moving to and from a bed to a chair (including a wheelchair)?: A Lot Help needed standing up from a chair using your arms (e.g., wheelchair or bedside chair)?: A Lot Help needed to walk in hospital room?: Total Help needed climbing 3-5 steps with a railing? : Total 6 Click Score: 10    End of Session   Activity Tolerance: Patient limited by pain Patient left: with call bell/phone within reach;with bed alarm set   PT Visit Diagnosis: Pain Pain - part of body:  (back)     Time: 1050-1058 PT Time Calculation (min) (ACUTE ONLY): 8 min  Charges:      PT General Charges $$ ACUTE PT VISIT: 1 Visit (no charge)                         Faye Ramsay, PT Acute Rehabilitation  Office: [phone]

## 2023-01-06 NOTE — Progress Notes (Signed)
Returned to pt's room to restart IV:  tech at bedside helping him with his lunch;  RN requests to wait right now,  and let pt eat while his food is warm, as he didn't eat breakfast.  RN will assess for an IV after he's done with his lunch.

## 2023-01-07 DIAGNOSIS — N179 Acute kidney failure, unspecified: Secondary | ICD-10-CM | POA: Diagnosis not present

## 2023-01-07 LAB — COMPREHENSIVE METABOLIC PANEL
ALT: 24 U/L (ref 0–44)
AST: 13 U/L — ABNORMAL LOW (ref 15–41)
Albumin: 2.7 g/dL — ABNORMAL LOW (ref 3.5–5.0)
Alkaline Phosphatase: 100 U/L (ref 38–126)
Anion gap: 12 (ref 5–15)
BUN: 21 mg/dL (ref 8–23)
CO2: 22 mmol/L (ref 22–32)
Calcium: 10.3 mg/dL (ref 8.9–10.3)
Chloride: 103 mmol/L (ref 98–111)
Creatinine, Ser: 0.7 mg/dL (ref 0.61–1.24)
GFR, Estimated: >60 mL/min (ref >60)
Glucose, Bld: 120 mg/dL — ABNORMAL HIGH (ref 70–99)
Potassium: 4.3 mmol/L (ref 3.5–5.1)
Sodium: 137 mmol/L (ref 135–145)
Total Bilirubin: 1.4 mg/dL — ABNORMAL HIGH (ref 0.3–1.2)
Total Protein: 5.9 g/dL — ABNORMAL LOW (ref 6.5–8.1)

## 2023-01-07 LAB — GLUCOSE, CAPILLARY
Glucose-Capillary: 118 mg/dL — ABNORMAL HIGH (ref 70–99)
Glucose-Capillary: 146 mg/dL — ABNORMAL HIGH (ref 70–99)
Glucose-Capillary: 159 mg/dL — ABNORMAL HIGH (ref 70–99)

## 2023-01-07 MED ORDER — ORAL CARE MOUTH RINSE: 15.0000 mL | OROMUCOSAL | Status: DC | PRN

## 2023-01-07 NOTE — Progress Notes (Signed)
PROGRESS NOTE    Gavin Dennis  EAV:[id number] DOB: [birth year] DOA: 12/27/2022 PCP: Ollen Bowl, MD   Brief Narrative: 72yo male with h/o lung cancer undergoing treatment and chronic back pain who presented to the cancer center for routine appointment and was found to have tachycardia to 160. He was sent to the ER and evaluation showed acute PE. He was started on heparin infusion. Echo ordered. Improved HR (thought to be from SVT) with IVF. Marginal BPs, BP medications held. Also reported 3 weeks of diarrhea so C diff was ordered; however, patient reported no further diarrhea in the hospital and thinks this was related to Ensure. Oncology consulted, will see as an outpatient. He remains focused on aggressive treatment but will consult palliative care for assistance with pain management and GOC. Also with diffuse MSK lesions so will consult rad onc for possible radiation to relieve pain. Plan for initiation of radiation on 9/3.   Assessment & Plan:   Principal Problem:   Pulmonary embolism (HCC) Active Problems:   History of stroke   Aneurysm of ascending aorta without rupture (HCC)   Essential hypertension   Type 2 diabetes mellitus with other specified complication (HCC)   Malignant neoplasm of unspecified part of unspecified bronchus or lung (HCC)   Metastatic cancer to liver (HCC)   AKI (acute kidney injury) (HCC)   Anorexia   Hypercalcemia   Tachycardia   Pulmonary embolism (HCC) Started on heparin infusion -> Eliquis on 8/31 He is able to maintain his saturation on room air. R heart strain was not seen on CT Patients with active cancer are at high risk (>8%/year) for recurrent VTE and should be maintained on indefinite AC.   Hypercalcemia-down to 9.7 with IV fluids. Pharmacy recommends holding Zometa for now since Ca++ <12   Anorexia started Remeron 7.5 mg at bedtime 9/6   AKI (acute kidney injury) (HCC) Associated with high anion gap acidosis This is likely  multifactorial/prerenal due to low blood pressure from diarrhea  Resolved with hydration   Malignant neoplasm of unspecified part of unspecified bronchus or lung (HCC) Appears to have been lost to follow up since last appointment in 07/2022 Stage IIIb (T0 N2, M0) non-small cell lung cancer, adenocarcinoma presented with right hilar and mediastinal lymphadenopathy with no clear primary diagnosed in February 2024 CTA with progressive disease and diffuse mets that were also noted on CT on 12/09/2022 Dr. Arbutus Ped was consulted and saw patient on 8/30 He was offered palliative care/hospice but declined and wants to pursue chemoimmunotherapy He will f/u with oncology as an outpatient He has diffuse bony mets and Dr. Arbutus Ped also recommended consulting Rad Onc for possible radiation to help with his back pain Dr. Mitzi Hansen was consulted and will started treatment on 9/3 He reports that he does have transportation and will be able to get to/from the Cancer Center once his pain is controlled and he is able to walk PT consulted, recommends HHPT Palliative care is consulting, assisting with GOC as well as pain control Oxycodone dose was increased, Decadron was added, lidocaine patch was added. He now has diarrhea we will hold stool softeners laxatives.    Type 2 diabetes mellitus with other specified complication (HCC) CBG (last 3)  Recent Labs    01/06/23 2046 01/07/23 0735 01/07/23 1324  GLUCAP 145* 118* 146*   Last A1c was 6.7, good control Hold Glucophage Cover with sensitive-scale SSI    Essential hypertension on amlodipine and Toprol-XL continue. As needed hydralazine  Aneurysm of  ascending aorta without rupture (HCC) 4.6 thoracic aneurysm, needs semi-annual imaging 3.6 infrarenal aneurysm, f/u q2 years   History of stroke Hold Plavix while on Heparin drip, likely ok to dc since he will need AC lifelong Continue Lipitor  Estimated body mass index is 23.68 kg/m as calculated from the  following:   Height as of this encounter: 5\' 11"  (1.803 m).   Weight as of this encounter: 77 kg.  Consultants: Oncology Radiation Oncology Palliative care Nutrition   Procedures: None   Antibiotics: None  Subjective:  Diarrhea better Pain better Discussed with daughter over the phone today regarding plans to discharge him home tomorrow after radiation.  Objective: Vitals:   01/06/23 1705 01/06/23 1750 01/06/23 2043 01/07/23 1206  BP:   (!) 135/96 (!) 133/104  Pulse:   68 66  Resp: 20 17 18 20   Temp:   98.3 F (36.8 C) 98.2 F (36.8 C)  TempSrc:   Oral Oral  SpO2:   97% 98%  Weight:      Height:        Intake/Output Summary (Last 24 hours) at 01/07/2023 1328 Last data filed at 01/07/2023 0700 Gross per 24 hour  Intake 2364.31 ml  Output 1800 ml  Net 564.31 ml   Filed Weights   12/27/22 1124  Weight: 77 kg    Examination:  General exam: Appears in moderate distress due to back pain Respiratory system: Clear to auscultation. Respiratory effort normal. Cardiovascular system: S1 & S2 heard, RRR. No JVD, murmurs, rubs, gallops or clicks. No pedal edema. Gastrointestinal system: Abdomen is nondistended, soft and nontender. No organomegaly or masses felt. Normal bowel sounds heard. Central nervous system: Alert and oriented. Extremities: No edema  Data Reviewed: I have personally reviewed following labs and imaging studies  CBC: Recent Labs  Lab 01/01/23 0505 01/03/23 0506 01/06/23 0458 01/06/23 1401  WBC 7.6 11.5* 11.0* 10.1  NEUTROABS 6.4 9.0*  --   --   HGB 11.5* 12.1* 13.0 13.4  HCT 34.1* 36.5* 37.9* 39.2  MCV 98.6 100.8* 96.2 97.3  PLT 169 179 164 168   Basic Metabolic Panel: Recent Labs  Lab 01/01/23 0505 01/03/23 0506 01/06/23 1401 01/07/23 0455  NA 136 134* 136 137  K 4.1 3.6 4.0 4.3  CL 107 103 102 103  CO2 22 22 24 22   GLUCOSE 134* 123* 134* 120*  BUN 25* 21 22 21   CREATININE 1.01 0.73 0.76 0.70  CALCIUM 10.2 9.7 9.8 10.3    GFR: Estimated Creatinine Clearance: 91.5 mL/min (by C-G formula based on SCr of 0.7 mg/dL). Liver Function Tests: Recent Labs  Lab 01/06/23 1401 01/07/23 0455  AST 13* 13*  ALT 24 24  ALKPHOS 97 100  BILITOT 1.6* 1.4*  PROT 5.7* 5.9*  ALBUMIN 2.7* 2.7*   No results for input(s): "LIPASE", "AMYLASE" in the last 168 hours. No results for input(s): "AMMONIA" in the last 168 hours. Coagulation Profile: No results for input(s): "INR", "PROTIME" in the last 168 hours.  Cardiac Enzymes: No results for input(s): "CKTOTAL", "CKMB", "CKMBINDEX", "TROPONINI" in the last 168 hours. BNP (last 3 results) No results for input(s): "PROBNP" in the last 8760 hours. HbA1C: No results for input(s): "HGBA1C" in the last 72 hours. CBG: Recent Labs  Lab 01/06/23 1138 01/06/23 1713 01/06/23 2046 01/07/23 0735 01/07/23 1324  GLUCAP 90 165* 145* 118* 146*   Lipid Profile: No results for input(s): "CHOL", "HDL", "LDLCALC", "TRIG", "CHOLHDL", "LDLDIRECT" in the last 72 hours. Thyroid Function Tests: No results  for input(s): "TSH", "T4TOTAL", "FREET4", "T3FREE", "THYROIDAB" in the last 72 hours. Anemia Panel: No results for input(s): "VITAMINB12", "FOLATE", "FERRITIN", "TIBC", "IRON", "RETICCTPCT" in the last 72 hours. Sepsis Labs: No results for input(s): "PROCALCITON", "LATICACIDVEN" in the last 168 hours.  No results found for this or any previous visit (from the past 240 hour(s)).   Radiology Studies: No results found.   Scheduled Meds:  acetaminophen  1,000 mg Oral Q8H   amLODipine  10 mg Oral Daily   apixaban  5 mg Oral BID   atorvastatin  20 mg Oral q AM   dexamethasone  4 mg Oral Daily   insulin aspart  0-9 Units Subcutaneous TID WC   lidocaine  1 patch Transdermal Q24H   metoprolol succinate  25 mg Oral Daily   mirtazapine  7.5 mg Oral QHS   pantoprazole  40 mg Oral Daily   polyethylene glycol  17 g Oral Daily   sodium chloride flush  3 mL Intravenous Q12H    Continuous Infusions:  lactated ringers 75 mL/hr at 01/07/23 0625     LOS: 11 days    Time spent: 36 min Alwyn Ren, MD 01/07/2023, 1:28 PM

## 2023-01-07 NOTE — Progress Notes (Signed)
Received report from previous RN. Agree with previous assessment. Assisted patient with brushing his teeth. Pt resting comfortably in bed.

## 2023-01-07 NOTE — Plan of Care (Signed)
  Problem: Education: Goal: Knowledge of General Education information will improve Description: Including pain rating scale, medication(s)/side effects and non-pharmacologic comfort measures Outcome: Progressing   Problem: Health Behavior/Discharge Planning: Goal: Ability to manage health-related needs will improve Outcome: Progressing   Problem: Clinical Measurements: Goal: Ability to maintain clinical measurements within normal limits will improve Outcome: Progressing Goal: Diagnostic test results will improve Outcome: Progressing Goal: Respiratory complications will improve Outcome: Progressing Goal: Cardiovascular complication will be avoided Outcome: Progressing   Problem: Activity: Goal: Risk for activity intolerance will decrease Outcome: Progressing   Problem: Nutrition: Goal: Adequate nutrition will be maintained Outcome: Progressing   Problem: Coping: Goal: Level of anxiety will decrease Outcome: Progressing   Problem: Elimination: Goal: Will not experience complications related to bowel motility Outcome: Progressing Goal: Will not experience complications related to urinary retention Outcome: Progressing   Problem: Pain Managment: Goal: General experience of comfort will improve Outcome: Progressing   Problem: Safety: Goal: Ability to remain free from injury will improve Outcome: Progressing   Problem: Skin Integrity: Goal: Risk for impaired skin integrity will decrease Outcome: Progressing   Problem: Education: Goal: Ability to describe self-care measures that may prevent or decrease complications (Diabetes Survival Skills Education) will improve Outcome: Progressing Goal: Individualized Educational Video(s) Outcome: Progressing   Problem: Coping: Goal: Ability to adjust to condition or change in health will improve Outcome: Progressing   Problem: Fluid Volume: Goal: Ability to maintain a balanced intake and output will improve Outcome:  Progressing   Problem: Health Behavior/Discharge Planning: Goal: Ability to identify and utilize available resources and services will improve Outcome: Progressing Goal: Ability to manage health-related needs will improve Outcome: Progressing   Problem: Metabolic: Goal: Ability to maintain appropriate glucose levels will improve Outcome: Progressing   Problem: Nutritional: Goal: Maintenance of adequate nutrition will improve Outcome: Progressing Goal: Progress toward achieving an optimal weight will improve Outcome: Progressing   Problem: Skin Integrity: Goal: Risk for impaired skin integrity will decrease Outcome: Progressing   Problem: Tissue Perfusion: Goal: Adequacy of tissue perfusion will improve Outcome: Progressing   Problem: Clinical Measurements: Goal: Will remain free from infection Outcome: Not Progressing

## 2023-01-07 NOTE — Evaluation (Signed)
Clinical/Bedside Swallow Evaluation Patient Details  Name: Gavin Dennis MRN: [id number] Date of Birth: April 29, 1951  Today's Date: 01/07/2023 Time: SLP Start Time (ACUTE ONLY): 1436 SLP Stop Time (ACUTE ONLY): 1448 SLP Time Calculation (min) (ACUTE ONLY): 12 min  Past Medical History:  Past Medical History:  Diagnosis Date   Blurred vision, left eye    since stroke 06/2020   CVA (cerebral vascular accident) (HCC) 06/2020   DDD (degenerative disc disease), lumbar    Diabetes mellitus without complication (HCC)    History of radiation therapy    Right lung- 07/17/22-08/28/22- Dr. Antony Blackbird   Hyperlipidemia    Hypertension    Hypomagnesemia    Polio    childhood   Vitamin B 12 deficiency    Past Surgical History:  Past Surgical History:  Procedure Laterality Date   BRONCHIAL NEEDLE ASPIRATION BIOPSY  06/12/2022   Procedure: BRONCHIAL NEEDLE ASPIRATION BIOPSIES;  Surgeon: Chilton Greathouse, MD;  Location: [facility];  Service: Cardiopulmonary;;   COLONOSCOPY     ENDOBRONCHIAL ULTRASOUND Bilateral 06/12/2022   Procedure: ENDOBRONCHIAL ULTRASOUND;  Surgeon: Chilton Greathouse, MD;  Location: [facility];  Service: Cardiopulmonary;  Laterality: Bilateral;   HEMOSTASIS CONTROL  06/12/2022   Procedure: HEMOSTASIS CONTROL;  Surgeon: Chilton Greathouse, MD;  Location: [facility];  Service: Cardiopulmonary;;   LOOP RECORDER INSERTION N/A 03/15/2022   Procedure: LOOP RECORDER INSERTION;  Surgeon: Maurice Small, MD;  Location: [facility];  Service: Cardiovascular;  Laterality: N/A;   VIDEO BRONCHOSCOPY N/A 06/12/2022   Procedure: VIDEO BRONCHOSCOPY WITHOUT FLUORO;  Surgeon: Chilton Greathouse, MD;  Location: [facility];  Service: Cardiopulmonary;  Laterality: N/A;   HPI:  72 yo male with h/o lung cancer undergoing treatment and chronic back pain who presented to the cancer center for routine appointment and was found to have tachycardia to 160. He was sent to the ER and evaluation  showed acute PE.    Assessment / Plan / Recommendation  Clinical Impression  Patient presents with what appears to be normal oropharyngeal swallowing function. Timely mastication of bolus and no oral residue noted. No overt s/s of aspiration observed across consistencies. Per esophagram complete 08/2022 patient with deep penetration of thin liquids but no aspiration observed. No further SLP services indicated at this time. SLP Visit Diagnosis: Dysphagia, unspecified (R13.10)       Diet Recommendation Regular;Thin liquid    Liquid Administration via: Cup;Straw Medication Administration: Whole meds with liquid (or whole/crushed in puree if patient prefers) Supervision: Patient able to self feed Compensations: Slow rate;Small sips/bites Postural Changes: Seated upright at 90 degrees    Other  Recommendations Oral Care Recommendations: Oral care BID    Recommendations for follow up therapy are one component of a multi-disciplinary discharge planning process, led by the attending physician.  Recommendations may be updated based on patient status, additional functional criteria and insurance authorization.  Follow up Recommendations No SLP follow up        Swallow Study   General HPI: 72 yo male with h/o lung cancer undergoing treatment and chronic back pain who presented to the cancer center for routine appointment and was found to have tachycardia to 160. He was sent to the ER and evaluation showed acute PE. Type of Study: Bedside Swallow Evaluation Previous Swallow Assessment: esophagram complete 08/2022: Deep laryngeal penetration without definitive intratracheal  aspiration on this exam. Mild to moderate esophageal dysmotility.    No evidence of stricture, though note a barium tablet was not given  due to  concern for choking. Diet Prior to this Study: NPO Temperature Spikes Noted: No Respiratory Status: Room air History of Recent Intubation: No Behavior/Cognition: Alert;Pleasant  mood;Cooperative (intermittent frustration) Oral Cavity Assessment: Within Functional Limits Oral Care Completed by SLP: Recent completion by staff Oral Cavity - Dentition: Missing dentition Vision: Functional for self-feeding Self-Feeding Abilities: Able to feed self Patient Positioning: Upright in bed Baseline Vocal Quality: Hoarse ("for a few weeks") Volitional Cough: Strong Volitional Swallow: Able to elicit    Oral/Motor/Sensory Function Overall Oral Motor/Sensory Function: Within functional limits   Ice Chips Ice chips: Not tested   Thin Liquid Thin Liquid: Within functional limits Presentation: Self Fed;Straw    Nectar Thick Nectar Thick Liquid: Not tested   Honey Thick Honey Thick Liquid: Not tested   Puree Puree: Within functional limits Presentation: Spoon   Solid     Solid: Within functional limits Presentation: Self Fed     [employer] MA, CCC-SLP  Gavin Dennis Gavin Dennis 01/07/2023,2:52 PM

## 2023-01-08 ENCOUNTER — Ambulatory Visit
Admit: 2023-01-08 | Discharge: 2023-01-08 | Disposition: A | Discharge status: Home / Self Care | Payer: Medicare HMO | Attending: Radiation Oncology | Admitting: Radiation Oncology

## 2023-01-08 ENCOUNTER — Other Ambulatory Visit: Payer: Self-pay

## 2023-01-08 DIAGNOSIS — N179 Acute kidney failure, unspecified: Secondary | ICD-10-CM | POA: Diagnosis not present

## 2023-01-08 LAB — RAD ONC ARIA SESSION SUMMARY
Course Elapsed Days: 5
Plan Fractions Treated to Date: 4
Plan Prescribed Dose Per Fraction: 3 Gy
Plan Total Fractions Prescribed: 10
Plan Total Prescribed Dose: 30 Gy
Reference Point Dosage Given to Date: 12 Gy
Reference Point Session Dosage Given: 3 Gy
Session Number: 4

## 2023-01-08 LAB — GLUCOSE, CAPILLARY
Glucose-Capillary: 115 mg/dL — ABNORMAL HIGH (ref 70–99)
Glucose-Capillary: 111 mg/dL — ABNORMAL HIGH (ref 70–99)
Glucose-Capillary: 168 mg/dL — ABNORMAL HIGH (ref 70–99)

## 2023-01-08 MED ORDER — AMLODIPINE BESYLATE 10 MG PO TABS: 5.0000 mg | ORAL_TABLET | Freq: Every day | ORAL | 0 refills | Status: DC

## 2023-01-08 MED ORDER — MIRTAZAPINE 15 MG PO TBDP: 7.5000 mg | ORAL_TABLET | Freq: Every day | ORAL | 2 refills | Status: DC

## 2023-01-08 MED ORDER — APIXABAN 5 MG PO TABS: 5.0000 mg | ORAL_TABLET | Freq: Two times a day (BID) | ORAL | 2 refills | Status: DC

## 2023-01-08 MED ORDER — POLYETHYLENE GLYCOL 3350 17 G PO PACK: 17.0000 g | PACK | Freq: Every day | ORAL | 0 refills | Status: DC

## 2023-01-08 MED ORDER — OXYCODONE HCL 10 MG PO TABS: 10.0000 mg | ORAL_TABLET | ORAL | 0 refills | Status: DC | PRN

## 2023-01-08 MED ORDER — LIDOCAINE 5 % EX PTCH: 1.0000 | MEDICATED_PATCH | CUTANEOUS | 0 refills | Status: DC

## 2023-01-08 MED ORDER — DEXAMETHASONE 4 MG PO TABS: ORAL_TABLET | ORAL | 0 refills | Status: DC

## 2023-01-08 NOTE — Progress Notes (Signed)
Physical Therapy Treatment Patient Details Name: Karas Machnik MRN: [id number] DOB: 12-19-1950 Today's Date: 01/08/2023   History of Present Illness 72yo male with h/o lung cancer with diffuse bony mets undergoing treatment and chronic back pain who presented to the cancer center for routine appointment and was found to have tachycardia to 160.  He was sent to the ER and evaluation showed acute PE. PMH includes CVA 2022 and 03/2022, DM, HTN. CT 12/09/22 showed T10 and T12 compression fracture deformities.    PT Comments   Pt admitted with above diagnosis.  Pt currently with functional limitations due to the deficits listed below (see PT Problem List). PT communicated with nursing staff and pt pre-medicated to maximize therapy intervention. Pt indicated his back pain was "about the same".  Pt able to engage with bed mobility and transfer tasks, mod A for supine to sit, mod A for sit to stand and pt requiring min A for static standing and regressing to mod A for pivoting with multimodal cues and A for RW management. Pt left seated in reclienr and all needs in place. Pt will benefit from acute skilled PT to increase their independence and safety with mobility to allow discharge.      If plan is discharge home, recommend the following: A lot of help with walking and/or transfers;A lot of help with bathing/dressing/bathroom;Assistance with cooking/housework;Assist for transportation;Help with stairs or ramp for entrance   Can travel by private vehicle        Equipment Recommendations  Wheelchair (measurements PT);Wheelchair cushion (measurements PT)    Recommendations for Other Services       Precautions / Restrictions Precautions Precautions: Fall Precaution Comments: pt denies falls in past 6 months Restrictions Weight Bearing Restrictions: No     Mobility  Bed Mobility Overal bed mobility: Needs Assistance Bed Mobility: Sit to Supine       Sit to supine: Mod assist, Used rails, HOB  elevated   General bed mobility comments: mod A for trunk, tactle cues for B LE to EOB, min A to scoot anteriorly to EOB    Transfers Overall transfer level: Needs assistance Equipment used: Rolling walker (2 wheels) Transfers: Sit to/from Stand, Bed to chair/wheelchair/BSC Sit to Stand: Mod assist, From elevated surface Stand pivot transfers: Mod assist, From elevated surface         General transfer comment: pt required mod A to power up with pull to stand at RW, once in standing min A to maintain balance at RW, PT noted pt limiting LLE WB and significant toe out, pt required mod A to safely complete SPT bed to recliner with progressive incresed need for assist, wide BOS, B knee and hip flexion and assist to manage RW with strong cues for safety and technique and hand over hand assist to reach for arm rest for improved eccentric control to recliner.    Ambulation/Gait               General Gait Details: NT   Stairs             Wheelchair Mobility     Tilt Bed    Modified Rankin (Stroke Patients Only)       Balance Overall balance assessment: Needs assistance Sitting-balance support: Feet supported, No upper extremity supported Sitting balance-Leahy Scale: Fair     Standing balance support: Bilateral upper extremity supported, During functional activity, Reliant on assistive device for balance Standing balance-Leahy Scale: Poor Standing balance comment: pt denies falls in past 6  months                            Cognition Arousal: Alert Behavior During Therapy: WFL for tasks assessed/performed Overall Cognitive Status: Within Functional Limits for tasks assessed                                          Exercises      General Comments        Pertinent Vitals/Pain Pain Assessment Pain Assessment: Faces Faces Pain Scale: Hurts even more (pt reports it hurts about the same) Pain Location: back Pain Descriptors /  Indicators: Aching, Constant, Discomfort, Guarding Pain Intervention(s): Limited activity within patient's tolerance, Monitored during session, Premedicated before session, Repositioned    Home Living                          Prior Function            PT Goals (current goals can now be found in the care plan section) Acute Rehab PT Goals Patient Stated Goal: to get stronger PT Goal Formulation: With patient Time For Goal Achievement: 01/15/23 Potential to Achieve Goals: Fair Progress towards PT goals: Progressing toward goals    Frequency    Min 1X/week      PT Plan      Co-evaluation              AM-PAC PT "6 Clicks" Mobility   Outcome Measure  Help needed turning from your back to your side while in a flat bed without using bedrails?: A Lot Help needed moving from lying on your back to sitting on the side of a flat bed without using bedrails?: A Lot Help needed moving to and from a bed to a chair (including a wheelchair)?: A Lot Help needed standing up from a chair using your arms (e.g., wheelchair or bedside chair)?: A Lot Help needed to walk in hospital room?: Total Help needed climbing 3-5 steps with a railing? : Total 6 Click Score: 10    End of Session Equipment Utilized During Treatment: Gait belt Activity Tolerance: Patient limited by pain;Patient limited by fatigue Patient left: in chair;with call bell/phone within reach;with chair alarm set Nurse Communication: Mobility status PT Visit Diagnosis: Pain;Unsteadiness on feet (R26.81);Muscle weakness (generalized) (M62.81);Difficulty in walking, not elsewhere classified (R26.2) Pain - part of body:  (back)     Time: 1610-9604 PT Time Calculation (min) (ACUTE ONLY): 19 min  Charges:    $Therapeutic Activity: 8-22 mins PT General Charges $$ ACUTE PT VISIT: 1 Visit                     Johnny Bridge, PT Acute Rehab    Jacqualyn Posey 01/08/2023, 12:03 PM

## 2023-01-08 NOTE — Discharge Summary (Signed)
Physician Discharge Summary  Gavin Dennis WGN:[id number] DOB: [birth year] DOA: 12/27/2022  PCP: Gavin Bowl, MD  Admit date: 12/27/2022 Discharge date: 01/08/2023  Admitted From: home Disposition:home  Recommendations for Outpatient Follow-up:  Follow up with PCP in 1-2 weeks Please obtain BMP/CBC in one week 3 follow-up with Dr. Arbutus Dennis oncology  Home Health: Yes Equipment/Devices: Yes Discharge Condition: Stable CODE STATUS: DNR  Diet recommendation: cardiac  Brief/Interim Summary:72yo male with h/o lung cancer undergoing treatment and chronic back pain who presented to the cancer center for routine appointment and was found to have tachycardia to 160. He was sent to the ER and evaluation showed acute PE. He was started on heparin infusion. Echo ordered. Improved HR (thought to be from SVT) with IVF. Marginal BPs, BP medications held. Also reported 3 weeks of diarrhea so C diff was ordered; however, patient reported no further diarrhea in the hospital and thinks this was related to Ensure. Oncology consulted, will see as an outpatient. He remained focused on aggressive treatment but will consult palliative care for assistance with pain management and GOC. Also with diffuse MSK lesions so will consult rad onc for possible radiation to relieve pain. Plan for initiation of radiation on 9/3.    Discharge Diagnoses:  Principal Problem:   Pulmonary embolism (HCC) Active Problems:   History of stroke   Aneurysm of ascending aorta without rupture (HCC)   Essential hypertension   Type 2 diabetes mellitus with other specified complication (HCC)   Malignant neoplasm of unspecified part of unspecified bronchus or lung (HCC)   Metastatic cancer to liver (HCC)   AKI (acute kidney injury) (HCC)   Anorexia   Hypercalcemia   Tachycardia     Pulmonary embolism (HCC)-he was initially started on heparin and then switched to Eliquis on 12/30/2022.  CT and echo did not show any evidence of right  heart strain.  He was discharged on Eliquis.  Patients with active cancer are at high risk more than 8% per year for recurrent venous thromboembolism and should be maintained on indefinite anticoagulation.  Hypercalcemia-this was treated with IV fluids calcium on the day of discharge was 10.3.  Pharmacy recommended to hold Zometa for calcium less than 12.     Anorexia started Remeron 7.5 mg at bedtime continue outpatient.  This should help him with his appetite and sleep.  I will stop the trazodone.  AKI (acute kidney injury) (HCC) Associated with high anion gap acidosis This is likely multifactorial/prerenal due to low blood pressure from diarrhea  Resolved with hydration    Malignant neoplasm of unspecified part of unspecified bronchus or lung (HCC) Appears to have been lost to follow up since last appointment in 07/2022 Stage IIIb (T0 N2, M0) non-small cell lung cancer, adenocarcinoma presented with right hilar and mediastinal lymphadenopathy with no clear primary diagnosed in February 2024 CTA with progressive disease and diffuse mets that were also noted on CT on 12/09/2022 Dr. Arbutus Dennis was consulted and saw patient on 8/30 He was offered palliative care/hospice but declined and wants to pursue chemoimmunotherapy He will f/u with oncology as an outpatient He has diffuse bony mets and Dr. Arbutus Dennis also recommended consulting Rad Onc for possible radiation to help with his back pain Dr. Mitzi Dennis was consulted and started treatment on 9/3 Patient was able to ambulate prior to discharge.  He reported he has transportation to get back and forth from cancer center for radiation.  PT was recommended home health physical therapy.  His pain was controlled with  oxycodone lidocaine patch and Decadron.  Stool softeners were stopped due to diarrhea.    Type 2 diabetes mellitus with other specified complication (HCC) CBG (last 3)    Last A1c was 6.7, good control Continue Glucophage   essential  hypertension -he is Norvasc dose was decreased to 5 mg and Toprol-XL was continued.  Lasix was stopped on discharge with normal to soft blood pressure.  His p.o. intake remained poor.   Aneurysm of ascending aorta without rupture (HCC) 4.6 thoracic aneurysm, needs semi-annual imaging 3.6 infrarenal aneurysm follow-up every 2 years.  History of stroke-Plavix was stopped since he was started on Eliquis continue Lipitor.  Estimated body mass index is 23.68 kg/m as calculated from the following:   Height as of this encounter: 5\' 11"  (1.803 m).   Weight as of this encounter: 77 kg.  Discharge Instructions  Discharge Instructions     Amb Referral to Palliative Care   Complete by: As directed    Diet - low sodium heart healthy   Complete by: As directed    Increase activity slowly   Complete by: As directed       Allergies as of 01/08/2023   No Known Allergies      Medication List     STOP taking these medications    acetaminophen 500 MG tablet Commonly known as: TYLENOL   amLODipine 10 MG tablet Commonly known as: NORVASC   clopidogrel 75 MG tablet Commonly known as: PLAVIX   furosemide 40 MG tablet Commonly known as: LASIX   HYDROcodone-acetaminophen 5-325 MG tablet Commonly known as: NORCO/VICODIN   HYDROmorphone 2 MG tablet Commonly known as: DILAUDID   ondansetron 4 MG tablet Commonly known as: Zofran       TAKE these medications    apixaban 5 MG Tabs tablet Commonly known as: ELIQUIS Take 1 tablet (5 mg total) by mouth 2 (two) times daily.   atorvastatin 20 MG tablet Commonly known as: LIPITOR Take 20 mg by mouth in the morning.   dexamethasone 4 MG tablet Commonly known as: DECADRON Take half a tablet daily Start taking on: January 09, 2023   lidocaine 5 % Commonly known as: LIDODERM Place 1 patch onto the skin daily. Remove & Discard patch within 12 hours or as directed by MD   metFORMIN 500 MG tablet Commonly known as: GLUCOPHAGE Take  500 mg by mouth in the morning.   metoprolol succinate 25 MG 24 hr tablet Commonly known as: TOPROL-XL Take 25 mg by mouth daily.   mirtazapine 15 MG disintegrating tablet Commonly known as: REMERON SOL-TAB Take 0.5 tablets (7.5 mg total) by mouth at bedtime.   OMEGA-3 PO Take 2 capsules by mouth in the morning. OmegaXL Joint Pain Relief & Inflammation Supplement   Oxycodone HCl 10 MG Tabs Take 1 tablet (10 mg total) by mouth every 4 (four) hours as needed for moderate pain.   polyethylene glycol 17 g packet Commonly known as: MIRALAX / GLYCOLAX Take 17 g by mouth daily.   traZODone 50 MG tablet Commonly known as: DESYREL Take 50 mg by mouth at bedtime as needed.               Durable Medical Equipment  (From admission, onward)           Start     Ordered   01/03/23 0949  For home use only DME 3 n 1  Once        01/03/23 [phone]  Follow-up Information     [facility] Follow up.   Why: bedside commode        Care, [facility] Follow up.   Specialty: Home Health Services Why: [facility] physical therapy Contact information: [address] [place] [address] [phone]                No Known Allergies  Consultations: Palliative care  Procedures/Studies: ECHOCARDIOGRAM COMPLETE  Result Date: 12/28/2022    ECHOCARDIOGRAM REPORT   Patient Name:   Gavin Dennis Date of Exam: 12/28/2022 Medical Rec #:  [id number]    Height:       71.0 in Accession #:    [id number]   Weight:       169.8 lb Date of Birth:  07-02-1950    BSA:          1.967 m Patient Age:    71 years     BP:           124/94 mmHg Patient Gender: M            HR:           100 bpm. Exam Location:  Inpatient Procedure: 2D Echo, Cardiac Doppler and Color Doppler Indications:    Pulmonary embolus  History:        Patient has prior history of Echocardiogram examinations, most                 recent 03/13/2022. Stroke; Risk Factors:Dyslipidemia,                 Hypertension  and Diabetes. Lung CA, hx of radiation therapy.  Sonographer:    Milda Smart Referring Phys: [id number] [facility] GOEL  Sonographer Comments: Suboptimal parasternal window. Image acquisition challenging due to patient body habitus and Image acquisition challenging due to respiratory motion. IMPRESSIONS  1. Left ventricular ejection fraction, by estimation, is 60 to 65%. The left ventricle has normal function. The left ventricle has no regional wall motion abnormalities. There is mild left ventricular hypertrophy. Left ventricular diastolic parameters are indeterminate.  2. Right ventricular systolic function is normal. The right ventricular size is mildly enlarged. There is normal pulmonary artery systolic pressure. The estimated right ventricular systolic pressure is 19.3 mmHg.  3. The mitral valve is grossly normal. Trivial mitral valve regurgitation. No evidence of mitral stenosis.  4. The aortic valve is tricuspid. There is mild calcification of the aortic valve. There is mild thickening of the aortic valve. Aortic valve regurgitation is not visualized. No aortic stenosis is present.  5. The inferior vena cava is normal in size with greater than 50% respiratory variability, suggesting right atrial pressure of 3 mmHg. FINDINGS  Left Ventricle: Left ventricular ejection fraction, by estimation, is 60 to 65%. The left ventricle has normal function. The left ventricle has no regional wall motion abnormalities. The left ventricular internal cavity size was normal in size. There is  mild left ventricular hypertrophy. Left ventricular diastolic parameters are indeterminate. Right Ventricle: The right ventricular size is mildly enlarged. No increase in right ventricular wall thickness. Right ventricular systolic function is normal. There is normal pulmonary artery systolic pressure. The tricuspid regurgitant velocity is 2.02  m/s, and with an assumed right atrial pressure of 3 mmHg, the estimated right ventricular systolic  pressure is 19.3 mmHg. Left Atrium: Left atrial size was normal in size. Right Atrium: Right atrial size was normal in size. Pericardium: There is no evidence of pericardial effusion. Mitral Valve: The mitral  valve is grossly normal. Trivial mitral valve regurgitation. No evidence of mitral valve stenosis. Tricuspid Valve: The tricuspid valve is normal in structure. Tricuspid valve regurgitation is mild . No evidence of tricuspid stenosis. Aortic Valve: The aortic valve is tricuspid. There is mild calcification of the aortic valve. There is mild thickening of the aortic valve. Aortic valve regurgitation is not visualized. No aortic stenosis is present. Pulmonic Valve: The pulmonic valve was normal in structure. Pulmonic valve regurgitation is not visualized. No evidence of pulmonic stenosis. Aorta: The aortic root is normal in size and structure. Venous: The inferior vena cava is normal in size with greater than 50% respiratory variability, suggesting right atrial pressure of 3 mmHg. IAS/Shunts: No atrial level shunt detected by color flow Doppler.  LEFT VENTRICLE PLAX 2D LVIDd:         3.90 cm     Diastology LVIDs:         3.10 cm     LV e' medial:  6.09 cm/s LV PW:         1.10 cm     LV e' lateral: 8.59 cm/s LV IVS:        1.10 cm  LV Volumes (MOD) LV vol d, MOD A2C: 40.5 ml LV vol d, MOD A4C: 59.8 ml LV vol s, MOD A2C: 13.3 ml LV vol s, MOD A4C: 17.7 ml LV SV MOD A2C:     27.2 ml LV SV MOD A4C:     59.8 ml LV SV MOD BP:      34.2 ml RIGHT VENTRICLE             IVC RV S prime:     12.10 cm/s  IVC diam: 1.90 cm TAPSE (M-mode): 2.0 cm LEFT ATRIUM             Index LA diam:        3.20 cm 1.63 cm/m LA Vol (A2C):   22.9 ml 11.64 ml/m LA Vol (A4C):   29.8 ml 15.15 ml/m LA Biplane Vol: 27.1 ml 13.78 ml/m  AORTIC VALVE LVOT Vmax:   93.30 cm/s LVOT Vmean:  56.100 cm/s LVOT VTI:    0.126 m  AORTA Ao Root diam: 3.80 cm MR Peak grad: 36.0 mmHg   TRICUSPID VALVE MR Vmax:      300.00 cm/s TR Peak grad:   16.3 mmHg                            TR Vmax:        202.00 cm/s                            SHUNTS                           Systemic VTI: 0.13 m Weston Brass MD Electronically signed by Weston Brass MD Signature Date/Time: 12/28/2022/1:57:25 PM    Final    CT Angio Chest PE W and/or Wo Contrast  Result Date: 12/27/2022 CLINICAL DATA:  Tachypnea, tachycardia, generalized weakness, history of non-small cell lung cancer EXAM: CT ANGIOGRAPHY CHEST WITH CONTRAST TECHNIQUE: Multidetector CT imaging of the chest was performed using the standard protocol during bolus administration of intravenous contrast. Multiplanar CT image reconstructions and MIPs were obtained to evaluate the vascular anatomy. RADIATION DOSE REDUCTION: This exam was performed according to the departmental dose-optimization program which includes  automated exposure control, adjustment of the mA and/or kV according to patient size and/or use of iterative reconstruction technique. CONTRAST:  75mL OMNIPAQUE IOHEXOL 350 MG/ML SOLN COMPARISON:  12/09/2022, 12/27/2022 FINDINGS: Cardiovascular: This is a technically adequate evaluation of the pulmonary vasculature. There is a small nonocclusive left upper lobe segmental pulmonary embolus, reference image 57/4, new since prior study. Clot burden is minimal. No other filling defects. There is extrinsic narrowing of the right main pulmonary artery and right middle lobe pulmonary artery due to increasing soft tissue density at the right hilum, worrisome for progression of disease. The heart is unremarkable without pericardial effusion. 4.5 cm ascending thoracic aortic aneurysm is identified. Evaluation of the aortic lumen is limited due to timing of contrast bolus. Stable atherosclerosis of the aorta and coronary vasculature. Mediastinum/Nodes: Thyroid, trachea, and esophagus are unremarkable. Subcarinal adenopathy measures up to 15 mm in short axis, unchanged since prior exam. Increased soft tissue density at the right  hilum measures 1.8 x 3.6 cm reference image 72/4, previously having measured 2.0 x 1.7 cm. This results and increasing extrinsic compression upon the right main pulmonary artery and right middle lobe pulmonary artery. Lungs/Pleura: Upper lobe predominant emphysema again noted. Right perihilar lung consolidation is again noted, slightly more pronounced in the right middle and right lower lobe since prior study, likely sequela of radiation pneumonitis. Trace right pleural effusion is noted. There is some minimal tree in bud nodular airspace disease within the left upper lobe, unchanged since prior exam, likely sequela of inflammation or infection. No pneumothorax. Central airways are patent. Upper Abdomen: Metastatic lesions within the liver are again identified, measuring up to 4.2 cm in the left lobe image 121/4 and 2.5 cm in the right lobe image 168/4. These lesions are more difficult to appreciate due to timing of contrast bolus. Stable left adrenal mass consistent with metastatic disease. 3.6 cm infrarenal abdominal aortic aneurysm again noted. Musculoskeletal: Large lytic lesions are seen within the left scapula, left third rib, right lateral fifth rib, inferior margin of the right scapula, T10, and T12, consistent with metastatic disease. There are other smaller lucency seen throughout the thoracic cage consistent with additional metastatic foci, also stable. Review of the MIP images confirms the above findings. IMPRESSION: 1. Small left upper lobe segmental pulmonary embolus, new since prior study. Minimal clot burden without right heart strain. 2. Progressive right hilar mass, with extrinsic compression of the right pulmonary artery, consistent with worsening malignancy. 3. Progressive consolidation within the right perihilar region, greatest in the right lower lobe, compatible with radiation pneumonitis. 4. Continued findings of metastatic disease, with diffuse bony metastases, mediastinal adenopathy,  multiple liver lesions, and left adrenal mass as above. These are not appreciably changed since prior staging CT performed 12/09/2022. 5. 4.6 cm ascending thoracic aortic aneurysm. Ascending thoracic aortic aneurysm. Recommend semi-annual imaging followup by CTA or MRA and referral to cardiothoracic surgery if not already obtained. This recommendation follows 2010 ACCF/AHA/AATS/ACR/ASA/SCA/SCAI/SIR/STS/SVM Guidelines for the Diagnosis and Management of Patients With Thoracic Aortic Disease. Circulation. 2010; 121: Z610-R604. Aortic aneurysm NOS (ICD10-I71.9) 6. Abdominal aortic aneurysm measuring 3.6 cm infrarenal. Recommend follow-up every 2 years. Reference: J Am Coll Radiol 2013;10:789-794. Critical Value/emergent results were called by telephone at the time of interpretation on 12/27/2022 at 4:10 pm to provider [facility] , who verbally acknowledged these results. Electronically Signed   By: Sharlet Salina M.D.   On: 12/27/2022 16:10   DG Chest Portable 1 View  Result Date: 12/27/2022 CLINICAL DATA:  Tachypnea,  tachycardia, and generalized weakness EXAM: PORTABLE CHEST 1 VIEW COMPARISON:  CTA chest dated 12/09/2022, chest radiograph dated 06/12/2022 FINDINGS: Asymmetric low right lung volumes with irregular right hilar opacity, likely reflecting radiation changes. No pleural effusion or pneumothorax. The heart size and mediastinal contours are within normal limits. No acute osseous abnormality. IMPRESSION: Asymmetric low right lung volumes with irregular right hilar opacity, likely reflecting radiation changes. Electronically Signed   By: Agustin Cree M.D.   On: 12/27/2022 11:50   CUP PACEART REMOTE DEVICE CHECK  Result Date: 12/18/2022 ILR summary report received. Battery status OK. Normal device function. No new symptom, brady, or pause episodes. No new AF episodes. Monthly summary reports and ROV/PRN 2 tachy events previously reviewed as alert, HR's 167, <64min, narrow regular rhythm LA, CVRS  (Echo,  Carotid, EGD, Colonoscopy, ERCP)    Subjective:  Staff reports he is moving around better ambulating better Discharge Exam: Vitals:   01/08/23 0514 01/08/23 1345  BP: 122/81 (!) 126/96  Pulse: (!) 56 87  Resp: 18 16  Temp: 98 F (36.7 C) 97.7 F (36.5 C)  SpO2: 98% 95%   Vitals:   01/07/23 2005 01/07/23 2006 01/08/23 0514 01/08/23 1345  BP: (!) 138/97 129/87 122/81 (!) 126/96  Pulse: 65 66 (!) 56 87  Resp: 18  18 16   Temp: 97.9 F (36.6 C) 98.2 F (36.8 C) 98 F (36.7 C) 97.7 F (36.5 C)  TempSrc: Oral Oral Oral Oral  SpO2: 98% 99% 98% 95%  Weight:      Height:        General: Pt is alert, awake, not in acute distress Cardiovascular: RRR, S1/S2 +, no rubs, no gallops Respiratory: CTA bilaterally, no wheezing, no rhonchi Abdominal: Soft, NT, ND, bowel sounds + Extremities: no edema, no cyanosis    The results of significant diagnostics from this hospitalization (including imaging, microbiology, ancillary and laboratory) are listed below for reference.     Microbiology: No results found for this or any previous visit (from the past 240 hour(s)).   Labs: BNP (last 3 results) Recent Labs    12/09/22 0018  BNP 21.5   Basic Metabolic Panel: Recent Labs  Lab 01/03/23 0506 01/06/23 1401 01/07/23 0455  NA 134* 136 137  K 3.6 4.0 4.3  CL 103 102 103  CO2 22 24 22   GLUCOSE 123* 134* 120*  BUN 21 22 21   CREATININE 0.73 0.76 0.70  CALCIUM 9.7 9.8 10.3   Liver Function Tests: Recent Labs  Lab 01/06/23 1401 01/07/23 0455  AST 13* 13*  ALT 24 24  ALKPHOS 97 100  BILITOT 1.6* 1.4*  PROT 5.7* 5.9*  ALBUMIN 2.7* 2.7*   No results for input(s): "LIPASE", "AMYLASE" in the last 168 hours. No results for input(s): "AMMONIA" in the last 168 hours. CBC: Recent Labs  Lab 01/03/23 0506 01/06/23 0458 01/06/23 1401  WBC 11.5* 11.0* 10.1  NEUTROABS 9.0*  --   --   HGB 12.1* 13.0 13.4  HCT 36.5* 37.9* 39.2  MCV 100.8* 96.2 97.3  PLT 179 164 168    Cardiac Enzymes: No results for input(s): "CKTOTAL", "CKMB", "CKMBINDEX", "TROPONINI" in the last 168 hours. BNP: Invalid input(s): "POCBNP" CBG: Recent Labs  Lab 01/07/23 0735 01/07/23 1324 01/07/23 2056 01/08/23 0730 01/08/23 1144  GLUCAP 118* 146* 159* 115* 111*   D-Dimer No results for input(s): "DDIMER" in the last 72 hours. Hgb A1c No results for input(s): "HGBA1C" in the last 72 hours. Lipid Profile No results for input(s): "  CHOL", "HDL", "LDLCALC", "TRIG", "CHOLHDL", "LDLDIRECT" in the last 72 hours. Thyroid function studies No results for input(s): "TSH", "T4TOTAL", "T3FREE", "THYROIDAB" in the last 72 hours.  Invalid input(s): "FREET3" Anemia work up No results for input(s): "VITAMINB12", "FOLATE", "FERRITIN", "TIBC", "IRON", "RETICCTPCT" in the last 72 hours. Urinalysis    Component Value Date/Time   COLORURINE YELLOW 03/12/2022 1027   APPEARANCEUR CLEAR 03/12/2022 1027   LABSPEC 1.035 (H) 03/12/2022 1027   PHURINE 7.0 03/12/2022 1027   GLUCOSEU NEGATIVE 03/12/2022 1027   HGBUR MODERATE (A) 03/12/2022 1027   BILIRUBINUR NEGATIVE 03/12/2022 1027   KETONESUR NEGATIVE 03/12/2022 1027   PROTEINUR NEGATIVE 03/12/2022 1027   NITRITE NEGATIVE 03/12/2022 1027   LEUKOCYTESUR NEGATIVE 03/12/2022 1027   Sepsis Labs Recent Labs  Lab 01/03/23 0506 01/06/23 0458 01/06/23 1401  WBC 11.5* 11.0* 10.1   Microbiology No results found for this or any previous visit (from the past 240 hour(s)).   Time coordinating discharge: 39 minutes  SIGNED:  Alwyn Ren, MD  [facility] 01/08/2023, 2:58 PM

## 2023-01-08 NOTE — Care Management Important Message (Signed)
Important Message  Patient Details IM Letter given Name: Gavin Dennis MRN: [id number] Date of Birth: November 22, 1950   Medicare Important Message Given:  Yes     [name] 01/08/2023, 1:56 PM

## 2023-01-08 NOTE — TOC Transition Note (Signed)
Transition of Care [facility]) - CM/SW Discharge Note   Patient Details  Name: Gavin Dennis MRN: [id number] Date of Birth: 02/14/1951  [facility] of [facility]) CM/SW Contact:  Lanier Clam, RN Phone Number: 01/08/2023, 10:37 AM   Clinical Narrative:  d/c home w/HHC & 3n1-[facility]. Has own transportation home.     Final next level of care: Home w Home Health Services Barriers to Discharge: No Barriers Identified   Patient Goals and CMS Choice CMS Medicare.gov Compare Post Acute Care list provided to:: Patient Represenative (must comment) ([place](dtr)) Choice offered to / list presented to : Adult Children  Discharge Placement                         Discharge Plan and Services Additional resources added to the After Visit Summary for     Discharge Planning Services: CM Consult Post Acute Care Choice: Home Health          DME Arranged: 3-N-1 DME Agency: [employer] Date DME Agency Contacted: 01/03/23 Time DME Agency Contacted: [phone] Representative spoke with at DME Agency: Vaughan Basta HH Arranged: PT          Social Determinants of Health (SDOH) Interventions SDOH Screenings   Food Insecurity: No Food Insecurity (12/27/2022)  Housing: Low Risk  (12/30/2022)  Recent Concern: Housing - Medium Risk (12/27/2022)  Transportation Needs: No Transportation Needs (12/27/2022)  Utilities: Not At Risk (12/27/2022)  Alcohol Screen: Low Risk  (07/29/2021)  Depression (PHQ2-9): Low Risk  (07/05/2022)  Financial Resource Strain: Low Risk  (12/30/2022)  Physical Activity: Insufficiently Active (07/29/2021)  Social Connections: Moderately Integrated (07/29/2021)  Stress: No Stress Concern Present (07/29/2021)  Tobacco Use: Medium Risk (12/27/2022)     Readmission Risk Interventions     No data to display

## 2023-01-09 ENCOUNTER — Ambulatory Visit: Payer: Medicare HMO

## 2023-01-09 ENCOUNTER — Telehealth: Payer: Self-pay | Admitting: Radiation Oncology

## 2023-01-09 NOTE — Telephone Encounter (Signed)
9/10 @ 1:56 pm Patient's daughter called to cancel patient treatment appointment for today due to transportation issues.  Email sent to Support RTT and copied L1 machine so they are aware.

## 2023-01-10 ENCOUNTER — Emergency Department (HOSPITAL_COMMUNITY): Payer: Medicare HMO

## 2023-01-10 ENCOUNTER — Other Ambulatory Visit: Payer: Self-pay

## 2023-01-10 ENCOUNTER — Encounter (HOSPITAL_COMMUNITY): Payer: Self-pay | Admitting: Internal Medicine

## 2023-01-10 ENCOUNTER — Ambulatory Visit: Admission: RE | Admit: 2023-01-10 | Payer: Medicare HMO | Source: Ambulatory Visit

## 2023-01-10 ENCOUNTER — Encounter: Payer: Self-pay | Admitting: Radiation Oncology

## 2023-01-10 ENCOUNTER — Ambulatory Visit
Admission: RE | Admit: 2023-01-10 | Discharge: 2023-01-10 | Disposition: A | Discharge status: Home / Self Care | Payer: Medicare HMO | Source: Ambulatory Visit | Attending: Radiation Oncology | Admitting: Radiation Oncology

## 2023-01-10 ENCOUNTER — Inpatient Hospital Stay (HOSPITAL_COMMUNITY)
Admission: EM | Admit: 2023-01-10 | Discharge: 2023-01-30 | DRG: 180 | Disposition: E | Payer: Medicare HMO | Source: Ambulatory Visit | Attending: Internal Medicine | Admitting: Internal Medicine

## 2023-01-10 ENCOUNTER — Telehealth: Payer: Self-pay | Admitting: Radiation Oncology

## 2023-01-10 VITALS — BP 94/73 | HR 149 | Resp 20

## 2023-01-10 DIAGNOSIS — Z8042 Family history of malignant neoplasm of prostate: Secondary | ICD-10-CM

## 2023-01-10 DIAGNOSIS — D696 Thrombocytopenia, unspecified: Secondary | ICD-10-CM | POA: Diagnosis present

## 2023-01-10 DIAGNOSIS — R52 Pain, unspecified: Secondary | ICD-10-CM | POA: Diagnosis present

## 2023-01-10 DIAGNOSIS — Z1152 Encounter for screening for COVID-19: Secondary | ICD-10-CM

## 2023-01-10 DIAGNOSIS — D6859 Other primary thrombophilia: Secondary | ICD-10-CM | POA: Diagnosis present

## 2023-01-10 DIAGNOSIS — R0682 Tachypnea, not elsewhere classified: Secondary | ICD-10-CM | POA: Diagnosis not present

## 2023-01-10 DIAGNOSIS — H18413 Arcus senilis, bilateral: Secondary | ICD-10-CM | POA: Diagnosis present

## 2023-01-10 DIAGNOSIS — I7121 Aneurysm of the ascending aorta, without rupture: Secondary | ICD-10-CM | POA: Diagnosis present

## 2023-01-10 DIAGNOSIS — J439 Emphysema, unspecified: Secondary | ICD-10-CM | POA: Diagnosis present

## 2023-01-10 DIAGNOSIS — Z923 Personal history of irradiation: Secondary | ICD-10-CM

## 2023-01-10 DIAGNOSIS — E119 Type 2 diabetes mellitus without complications: Secondary | ICD-10-CM | POA: Diagnosis present

## 2023-01-10 DIAGNOSIS — M8458XA Pathological fracture in neoplastic disease, other specified site, initial encounter for fracture: Secondary | ICD-10-CM | POA: Diagnosis present

## 2023-01-10 DIAGNOSIS — Z7984 Long term (current) use of oral hypoglycemic drugs: Secondary | ICD-10-CM

## 2023-01-10 DIAGNOSIS — C3491 Malignant neoplasm of unspecified part of right bronchus or lung: Principal | ICD-10-CM | POA: Diagnosis present

## 2023-01-10 DIAGNOSIS — Z681 Body mass index (BMI) 19 or less, adult: Secondary | ICD-10-CM | POA: Diagnosis not present

## 2023-01-10 DIAGNOSIS — R54 Age-related physical debility: Secondary | ICD-10-CM | POA: Diagnosis present

## 2023-01-10 DIAGNOSIS — Z510 Encounter for antineoplastic radiation therapy: Secondary | ICD-10-CM | POA: Diagnosis not present

## 2023-01-10 DIAGNOSIS — Z515 Encounter for palliative care: Secondary | ICD-10-CM

## 2023-01-10 DIAGNOSIS — J479 Bronchiectasis, uncomplicated: Secondary | ICD-10-CM | POA: Diagnosis not present

## 2023-01-10 DIAGNOSIS — Z7189 Other specified counseling: Secondary | ICD-10-CM | POA: Diagnosis not present

## 2023-01-10 DIAGNOSIS — C3490 Malignant neoplasm of unspecified part of unspecified bronchus or lung: Secondary | ICD-10-CM | POA: Diagnosis not present

## 2023-01-10 DIAGNOSIS — I6381 Other cerebral infarction due to occlusion or stenosis of small artery: Secondary | ICD-10-CM | POA: Diagnosis not present

## 2023-01-10 DIAGNOSIS — Z79899 Other long term (current) drug therapy: Secondary | ICD-10-CM

## 2023-01-10 DIAGNOSIS — R918 Other nonspecific abnormal finding of lung field: Secondary | ICD-10-CM | POA: Diagnosis not present

## 2023-01-10 DIAGNOSIS — I700 Atherosclerosis of aorta: Secondary | ICD-10-CM | POA: Diagnosis present

## 2023-01-10 DIAGNOSIS — G893 Neoplasm related pain (acute) (chronic): Secondary | ICD-10-CM | POA: Diagnosis not present

## 2023-01-10 DIAGNOSIS — E785 Hyperlipidemia, unspecified: Secondary | ICD-10-CM | POA: Diagnosis present

## 2023-01-10 DIAGNOSIS — C781 Secondary malignant neoplasm of mediastinum: Secondary | ICD-10-CM | POA: Diagnosis not present

## 2023-01-10 DIAGNOSIS — R0602 Shortness of breath: Secondary | ICD-10-CM | POA: Diagnosis not present

## 2023-01-10 DIAGNOSIS — C787 Secondary malignant neoplasm of liver and intrahepatic bile duct: Secondary | ICD-10-CM | POA: Diagnosis present

## 2023-01-10 DIAGNOSIS — J9601 Acute respiratory failure with hypoxia: Secondary | ICD-10-CM | POA: Diagnosis not present

## 2023-01-10 DIAGNOSIS — T17908A Unspecified foreign body in respiratory tract, part unspecified causing other injury, initial encounter: Secondary | ICD-10-CM | POA: Diagnosis not present

## 2023-01-10 DIAGNOSIS — I959 Hypotension, unspecified: Secondary | ICD-10-CM | POA: Diagnosis present

## 2023-01-10 DIAGNOSIS — R64 Cachexia: Secondary | ICD-10-CM | POA: Diagnosis present

## 2023-01-10 DIAGNOSIS — C7951 Secondary malignant neoplasm of bone: Secondary | ICD-10-CM

## 2023-01-10 DIAGNOSIS — Z95818 Presence of other cardiac implants and grafts: Secondary | ICD-10-CM

## 2023-01-10 DIAGNOSIS — R6510 Systemic inflammatory response syndrome (SIRS) of non-infectious origin without acute organ dysfunction: Secondary | ICD-10-CM | POA: Diagnosis present

## 2023-01-10 DIAGNOSIS — R590 Localized enlarged lymph nodes: Secondary | ICD-10-CM | POA: Diagnosis not present

## 2023-01-10 DIAGNOSIS — J690 Pneumonitis due to inhalation of food and vomit: Secondary | ICD-10-CM | POA: Diagnosis not present

## 2023-01-10 DIAGNOSIS — I77819 Aortic ectasia, unspecified site: Secondary | ICD-10-CM | POA: Diagnosis not present

## 2023-01-10 DIAGNOSIS — E872 Acidosis, unspecified: Secondary | ICD-10-CM | POA: Diagnosis present

## 2023-01-10 DIAGNOSIS — J90 Pleural effusion, not elsewhere classified: Secondary | ICD-10-CM | POA: Diagnosis present

## 2023-01-10 DIAGNOSIS — Z86711 Personal history of pulmonary embolism: Secondary | ICD-10-CM

## 2023-01-10 DIAGNOSIS — Z9221 Personal history of antineoplastic chemotherapy: Secondary | ICD-10-CM

## 2023-01-10 DIAGNOSIS — Z8673 Personal history of transient ischemic attack (TIA), and cerebral infarction without residual deficits: Secondary | ICD-10-CM

## 2023-01-10 DIAGNOSIS — Z87891 Personal history of nicotine dependence: Secondary | ICD-10-CM

## 2023-01-10 DIAGNOSIS — C801 Malignant (primary) neoplasm, unspecified: Secondary | ICD-10-CM | POA: Diagnosis not present

## 2023-01-10 DIAGNOSIS — Z801 Family history of malignant neoplasm of trachea, bronchus and lung: Secondary | ICD-10-CM

## 2023-01-10 DIAGNOSIS — G9389 Other specified disorders of brain: Secondary | ICD-10-CM | POA: Diagnosis not present

## 2023-01-10 DIAGNOSIS — I10 Essential (primary) hypertension: Secondary | ICD-10-CM | POA: Diagnosis present

## 2023-01-10 DIAGNOSIS — Z7901 Long term (current) use of anticoagulants: Secondary | ICD-10-CM

## 2023-01-10 DIAGNOSIS — Z66 Do not resuscitate: Secondary | ICD-10-CM | POA: Diagnosis not present

## 2023-01-10 LAB — I-STAT CHEM 8, ED
BUN: 30 mg/dL — ABNORMAL HIGH (ref 8–23)
Calcium, Ion: 1.47 mmol/L — ABNORMAL HIGH (ref 1.15–1.40)
Chloride: 104 mmol/L (ref 98–111)
Creatinine, Ser: 0.9 mg/dL (ref 0.61–1.24)
Glucose, Bld: 212 mg/dL — ABNORMAL HIGH (ref 70–99)
HCT: 44 % (ref 39.0–52.0)
Hemoglobin: 15 g/dL (ref 13.0–17.0)
Potassium: 4.4 mmol/L (ref 3.5–5.1)
Sodium: 137 mmol/L (ref 135–145)
TCO2: 21 mmol/L — ABNORMAL LOW (ref 22–32)

## 2023-01-10 LAB — COMPREHENSIVE METABOLIC PANEL
ALT: 31 U/L (ref 0–44)
AST: 23 U/L (ref 15–41)
Albumin: 3.2 g/dL — ABNORMAL LOW (ref 3.5–5.0)
Alkaline Phosphatase: 118 U/L (ref 38–126)
Anion gap: 16 — ABNORMAL HIGH (ref 5–15)
BUN: 30 mg/dL — ABNORMAL HIGH (ref 8–23)
CO2: 19 mmol/L — ABNORMAL LOW (ref 22–32)
Calcium: 10.5 mg/dL — ABNORMAL HIGH (ref 8.9–10.3)
Chloride: 101 mmol/L (ref 98–111)
Creatinine, Ser: 1.14 mg/dL (ref 0.61–1.24)
GFR, Estimated: >60 mL/min (ref >60)
Glucose, Bld: 227 mg/dL — ABNORMAL HIGH (ref 70–99)
Potassium: 4 mmol/L (ref 3.5–5.1)
Sodium: 136 mmol/L (ref 135–145)
Total Bilirubin: 1.9 mg/dL — ABNORMAL HIGH (ref 0.3–1.2)
Total Protein: 7.1 g/dL (ref 6.5–8.1)

## 2023-01-10 LAB — CBC WITH DIFFERENTIAL/PLATELET
Abs Immature Granulocytes: 0.12 10*3/uL — ABNORMAL HIGH (ref 0.00–0.07)
Basophils Absolute: 0 10*3/uL (ref 0.0–0.1)
Basophils Relative: 0 %
Eosinophils Absolute: 0.1 10*3/uL (ref 0.0–0.5)
Eosinophils Relative: 1 %
HCT: 45.9 % (ref 39.0–52.0)
Hemoglobin: 15.1 g/dL (ref 13.0–17.0)
Immature Granulocytes: 1 %
Lymphocytes Relative: 2 %
Lymphs Abs: 0.2 10*3/uL — ABNORMAL LOW (ref 0.7–4.0)
MCH: 32.9 pg (ref 26.0–34.0)
MCHC: 32.9 g/dL (ref 30.0–36.0)
MCV: 100 fL (ref 80.0–100.0)
Monocytes Absolute: 0.4 10*3/uL (ref 0.1–1.0)
Monocytes Relative: 4 %
Neutro Abs: 9.2 10*3/uL — ABNORMAL HIGH (ref 1.7–7.7)
Neutrophils Relative %: 92 %
Platelets: 110 10*3/uL — ABNORMAL LOW (ref 150–400)
RBC: 4.59 MIL/uL (ref 4.22–5.81)
RDW: 15.2 % (ref 11.5–15.5)
WBC: 9.9 10*3/uL (ref 4.0–10.5)
nRBC: 0 % (ref 0.0–0.2)

## 2023-01-10 LAB — RESP PANEL BY RT-PCR (RSV, FLU A&B, COVID)  RVPGX2
Influenza A by PCR: NEGATIVE
Influenza B by PCR: NEGATIVE
Resp Syncytial Virus by PCR: NEGATIVE
SARS Coronavirus 2 by RT PCR: NEGATIVE

## 2023-01-10 LAB — BLOOD GAS, VENOUS
Acid-base deficit: 2.6 mmol/L — ABNORMAL HIGH (ref 0.0–2.0)
Bicarbonate: 23.2 mmol/L (ref 20.0–28.0)
O2 Saturation: 20.6 %
Patient temperature: 37
pCO2, Ven: 43 mmHg — ABNORMAL LOW (ref 44–60)
pH, Ven: 7.34 (ref 7.25–7.43)
pO2, Ven: <31 mmHg — CL (ref 32–45)

## 2023-01-10 LAB — BRAIN NATRIURETIC PEPTIDE: B Natriuretic Peptide: 38.3 pg/mL (ref 0.0–100.0)

## 2023-01-10 LAB — I-STAT CG4 LACTIC ACID, ED
Lactic Acid, Venous: 4.1 mmol/L (ref 0.5–1.9)
Lactic Acid, Venous: 2.6 mmol/L (ref 0.5–1.9)

## 2023-01-10 LAB — GLUCOSE, CAPILLARY: Glucose-Capillary: 120 mg/dL — ABNORMAL HIGH (ref 70–99)

## 2023-01-10 LAB — TROPONIN I (HIGH SENSITIVITY)
Troponin I (High Sensitivity): 62 ng/L — ABNORMAL HIGH (ref <18)
Troponin I (High Sensitivity): 61 ng/L — ABNORMAL HIGH (ref <18)

## 2023-01-10 MED ORDER — OXYCODONE HCL 5 MG PO TABS
ORAL_TABLET | ORAL | Status: AC
  Filled 2023-01-10: qty 2

## 2023-01-10 MED ORDER — METOPROLOL SUCCINATE ER 50 MG PO TB24
25.0000 mg | ORAL_TABLET | Freq: Every day | ORAL | Status: DC
  Administered 2023-01-11 – 2023-01-13 (×3): 25 mg via ORAL
  Filled 2023-01-10 (×4): qty 1

## 2023-01-10 MED ORDER — MIRTAZAPINE 15 MG PO TBDP
7.5000 mg | ORAL_TABLET | Freq: Every day | ORAL | Status: DC
  Administered 2023-01-10 – 2023-01-14 (×5): 7.5 mg via ORAL
  Filled 2023-01-10 (×8): qty 0.5

## 2023-01-10 MED ORDER — HYDROMORPHONE HCL 1 MG/ML IJ SOLN
0.5000 mg | INTRAMUSCULAR | Status: DC | PRN
  Administered 2023-01-11 – 2023-01-15 (×8): 0.5 mg via INTRAVENOUS
  Filled 2023-01-10 (×8): qty 0.5

## 2023-01-10 MED ORDER — INSULIN ASPART 100 UNIT/ML IJ SOLN
0.0000 [IU] | Freq: Three times a day (TID) | INTRAMUSCULAR | Status: DC
  Administered 2023-01-11: 1 [IU] via SUBCUTANEOUS
  Administered 2023-01-12: 2 [IU] via SUBCUTANEOUS
  Administered 2023-01-12 – 2023-01-13 (×2): 1 [IU] via SUBCUTANEOUS
  Administered 2023-01-13: 2 [IU] via SUBCUTANEOUS
  Administered 2023-01-14 – 2023-01-15 (×3): 1 [IU] via SUBCUTANEOUS
  Filled 2023-01-10: qty 0.09

## 2023-01-10 MED ORDER — LEVALBUTEROL HCL 0.63 MG/3ML IN NEBU: 0.6300 mg | INHALATION_SOLUTION | Freq: Four times a day (QID) | RESPIRATORY_TRACT | Status: DC | PRN

## 2023-01-10 MED ORDER — GUAIFENESIN ER 600 MG PO TB12
600.0000 mg | ORAL_TABLET | Freq: Two times a day (BID) | ORAL | Status: DC
  Administered 2023-01-10 – 2023-01-14 (×9): 600 mg via ORAL
  Filled 2023-01-10 (×9): qty 1

## 2023-01-10 MED ORDER — IPRATROPIUM-ALBUTEROL 0.5-2.5 (3) MG/3ML IN SOLN
3.0000 mL | Freq: Once | RESPIRATORY_TRACT | Status: AC
  Administered 2023-01-10: 3 mL via RESPIRATORY_TRACT
  Filled 2023-01-10: qty 3

## 2023-01-10 MED ORDER — OXYCODONE HCL 5 MG PO TABS: 10.0000 mg | ORAL_TABLET | Freq: Once | ORAL | Status: DC

## 2023-01-10 MED ORDER — ACETAMINOPHEN 500 MG PO TABS
1000.0000 mg | ORAL_TABLET | Freq: Two times a day (BID) | ORAL | Status: DC
  Administered 2023-01-10 – 2023-01-14 (×9): 1000 mg via ORAL
  Filled 2023-01-10 (×9): qty 2

## 2023-01-10 MED ORDER — IOHEXOL 350 MG/ML SOLN
80.0000 mL | Freq: Once | INTRAVENOUS | Status: AC | PRN
  Administered 2023-01-10: 80 mL via INTRAVENOUS

## 2023-01-10 MED ORDER — OXYCODONE HCL 5 MG PO TABS
10.0000 mg | ORAL_TABLET | ORAL | Status: DC | PRN
  Administered 2023-01-11 – 2023-01-13 (×8): 10 mg via ORAL
  Filled 2023-01-10 (×8): qty 2

## 2023-01-10 MED ORDER — POLYETHYLENE GLYCOL 3350 17 G PO PACK: 17.0000 g | PACK | Freq: Every day | ORAL | Status: DC | PRN

## 2023-01-10 MED ORDER — UMECLIDINIUM BROMIDE 62.5 MCG/ACT IN AEPB
1.0000 | INHALATION_SPRAY | Freq: Every day | RESPIRATORY_TRACT | Status: DC
  Administered 2023-01-11 – 2023-01-15 (×5): 1 via RESPIRATORY_TRACT
  Filled 2023-01-10: qty 7

## 2023-01-10 MED ORDER — SODIUM CHLORIDE 0.9 % IV SOLN: INTRAVENOUS | Status: DC

## 2023-01-10 MED ORDER — LIDOCAINE 5 % EX PTCH
1.0000 | MEDICATED_PATCH | CUTANEOUS | Status: DC
  Administered 2023-01-11 – 2023-01-14 (×4): 1 via TRANSDERMAL
  Filled 2023-01-10 (×8): qty 1

## 2023-01-10 MED ORDER — APIXABAN 5 MG PO TABS
5.0000 mg | ORAL_TABLET | Freq: Two times a day (BID) | ORAL | Status: DC
  Administered 2023-01-10 – 2023-01-13 (×7): 5 mg via ORAL
  Filled 2023-01-10 (×7): qty 1

## 2023-01-10 MED ORDER — DOCUSATE SODIUM 100 MG PO CAPS
100.0000 mg | ORAL_CAPSULE | Freq: Two times a day (BID) | ORAL | Status: DC
  Administered 2023-01-10 – 2023-01-14 (×9): 100 mg via ORAL
  Filled 2023-01-10 (×8): qty 1

## 2023-01-10 MED ORDER — DEXAMETHASONE 2 MG PO TABS
2.0000 mg | ORAL_TABLET | Freq: Every day | ORAL | Status: DC
  Administered 2023-01-11 – 2023-01-14 (×4): 2 mg via ORAL
  Filled 2023-01-10 (×5): qty 1

## 2023-01-10 MED ORDER — SODIUM CHLORIDE 0.9 % IV BOLUS
1000.0000 mL | Freq: Once | INTRAVENOUS | Status: AC
  Administered 2023-01-10: 1000 mL via INTRAVENOUS

## 2023-01-10 MED ORDER — LACTATED RINGERS IV BOLUS
500.0000 mL | Freq: Once | INTRAVENOUS | Status: AC
  Administered 2023-01-10: 500 mL via INTRAVENOUS

## 2023-01-10 NOTE — ED Triage Notes (Signed)
Pt brought over from cancer center c/o sob, low o2 and tachycardia.

## 2023-01-10 NOTE — ED Provider Notes (Signed)
[facility] EMERGENCY DEPARTMENT AT [facility] Provider Note   CSN: [id number] Arrival date & time: 01/04/2023  1455     History  Chief Complaint  Patient presents with   Shortness of Breath    Gavin Dennis is a 72 y.o. male with lung cancer metastatic to liver, neutropenia, history of CVA, history of PE, T2DM, emphysema, ascending aortic aneurysm who is brought over from cancer center c/o sob, low o2 and tachycardia.  Per chart review patient was recently admitted from 12/27/2022 to 01/08/2023 found to be tachycardic likely SVT and diagnosed with acute PE.  Started on heparin.  Also had diarrhea thought to be related to Ensure.  Patient currently undergoing aggressive treatment.  He was there today for radiation treatment.  He states he is not sure when he began to feel short of breath and endorses low back pain but no upper back or chest pain.  Denies any nausea vomiting constipation.  Denies fevers chills, cough, wheezing, hemoptysis, leg swelling.  He states he has been Compliant with his Eliquis.  On arrival patient is in mild respiratory distress and tachycardic into the 120s.  Saturating high 80s or 90% on room air and placed on 2 L which he states helps his shortness of breath some.  Past Medical History:  Diagnosis Date   Blurred vision, left eye    since stroke 06/2020   CVA (cerebral vascular accident) (HCC) 06/2020   DDD (degenerative disc disease), lumbar    Diabetes mellitus without complication (HCC)    History of radiation therapy    Right lung- 07/17/22-08/28/22- Dr. Antony Blackbird   Hyperlipidemia    Hypertension    Hypomagnesemia    Polio    childhood   Vitamin B 12 deficiency        Home Medications Prior to Admission medications   Medication Sig Start Date End Date Taking? Authorizing Provider  amLODipine (NORVASC) 10 MG tablet Take 0.5 tablets (5 mg total) by mouth daily. 01/08/23 02/07/23  Alwyn Ren, MD  apixaban (ELIQUIS) 5 MG TABS tablet  Take 1 tablet (5 mg total) by mouth 2 (two) times daily. 01/08/23   Alwyn Ren, MD  atorvastatin (LIPITOR) 20 MG tablet Take 20 mg by mouth in the morning. 04/06/22   [provider]  dexamethasone (DECADRON) 4 MG tablet Take half a tablet daily 01/09/23   Alwyn Ren, MD  lidocaine (LIDODERM) 5 % Place 1 patch onto the skin daily. Remove & Discard patch within 12 hours or as directed by MD 01/08/23   Alwyn Ren, MD  metFORMIN (GLUCOPHAGE) 500 MG tablet Take 500 mg by mouth in the morning.    [provider]  metoprolol succinate (TOPROL-XL) 25 MG 24 hr tablet Take 25 mg by mouth daily. 06/20/22   [provider]  mirtazapine (REMERON SOL-TAB) 15 MG disintegrating tablet Take 0.5 tablets (7.5 mg total) by mouth at bedtime. 01/08/23   Alwyn Ren, MD  Omega-3 Fatty Acids (OMEGA-3 PO) Take 2 capsules by mouth in the morning. OmegaXL Joint Pain Relief & Inflammation Supplement    [provider]  oxyCODONE 10 MG TABS Take 1 tablet (10 mg total) by mouth every 4 (four) hours as needed for moderate pain. 01/08/23   Alwyn Ren, MD  polyethylene glycol (MIRALAX / GLYCOLAX) 17 g packet Take 17 g by mouth daily. 01/08/23   Alwyn Ren, MD      Allergies    Patient has no  known allergies.    Review of Systems   Review of Systems A 10 point review of systems was performed and is negative unless otherwise reported in HPI.  Physical Exam Updated Vital Signs BP (!) 129/93   Pulse 65   Temp 98.3 F (36.8 C)   Resp 12   Ht 5\' 11"  (1.803 m)   Wt 77 kg   SpO2 100%   BMI 23.68 kg/m  Physical Exam General: Chronically ill, uncomfortable appearing male, lying in bed.  HEENT: Sclera anicteric, dry mucous membranes, trachea midline.  Cardiology: Regular tachycardic rate, no murmurs/rubs/gallops. BL radial and DP pulses equal bilaterally.  Resp: increased WOB with tachypnea in 20s. Quiet breath sounds bilaterally.  Abd: Soft,  non-tender, non-distended. No rebound tenderness or guarding.  GU: Deferred. MSK: No peripheral edema or signs of trauma. Extremities without deformity or TTP. No cyanosis or clubbing. Skin: warm, dry.  Neuro: A&Ox4, CNs II-XII grossly intact. MAEs. Sensation grossly intact.  Psych: Normal mood and affect.   ED Results / Procedures / Treatments   Labs (all labs ordered are listed, but only abnormal results are displayed) Labs Reviewed  COMPREHENSIVE METABOLIC PANEL - Abnormal; Notable for the following components:      Result Value   CO2 19 (*)    Glucose, Bld 227 (*)    BUN 30 (*)    Calcium 10.5 (*)    Albumin 3.2 (*)    Total Bilirubin 1.9 (*)    Anion gap 16 (*)    All other components within normal limits  BLOOD GAS, VENOUS - Abnormal; Notable for the following components:   pCO2, Ven 43 (*)    pO2, Ven <31 (*)    Acid-base deficit 2.6 (*)    All other components within normal limits  CBC WITH DIFFERENTIAL/PLATELET - Abnormal; Notable for the following components:   Platelets 110 (*)    Neutro Abs 9.2 (*)    Lymphs Abs 0.2 (*)    Abs Immature Granulocytes 0.12 (*)    All other components within normal limits  I-STAT CHEM 8, ED - Abnormal; Notable for the following components:   BUN 30 (*)    Glucose, Bld 212 (*)    Calcium, Ion 1.47 (*)    TCO2 21 (*)    All other components within normal limits  I-STAT CG4 LACTIC ACID, ED - Abnormal; Notable for the following components:   Lactic Acid, Venous 4.1 (*)    All other components within normal limits  I-STAT CG4 LACTIC ACID, ED - Abnormal; Notable for the following components:   Lactic Acid, Venous 2.6 (*)    All other components within normal limits  TROPONIN I (HIGH SENSITIVITY) - Abnormal; Notable for the following components:   Troponin I (High Sensitivity) 62 (*)    All other components within normal limits  RESP PANEL BY RT-PCR (RSV, FLU A&B, COVID)  RVPGX2  CULTURE, BLOOD (ROUTINE X 2)  CULTURE, BLOOD (ROUTINE  X 2)  BRAIN NATRIURETIC PEPTIDE  COMPREHENSIVE METABOLIC PANEL  CBC  TROPONIN I (HIGH SENSITIVITY)    EKG EKG Interpretation Date/Time:  Wednesday January 10 2023 15:04:01 EDT Ventricular Rate:  125 PR Interval:  147 QRS Duration:  90 QT Interval:  320 QTC Calculation: 462 R Axis:   12  Text Interpretation: Sinus tachycardia Probable left atrial enlargement Confirmed by Vivi Barrack [phone]) on 01/27/2023 3:26:51 PM  Radiology CT Angio Chest PE W and/or Wo Contrast  Result Date: 01/08/2023 CLINICAL DATA:  Pulmonary  [facility] [facility] EMERGENCY DEPARTMENT AT [facility] Provider Note   CSN: [id number] Arrival date & time: 01/28/2023  1455     History  Chief Complaint  Patient presents with   Shortness of Breath    Gavin Dennis is a 72 y.o. male with lung cancer metastatic to liver, neutropenia, history of CVA, history of PE, T2DM, emphysema, ascending aortic aneurysm who is brought over from cancer center c/o sob, low o2 and tachycardia.  Per chart review patient was recently admitted from 12/27/2022 to 01/08/2023 found to be tachycardic likely SVT and diagnosed with acute PE.  Started on heparin.  Also had diarrhea thought to be related to Ensure.  Patient currently undergoing aggressive treatment.  He was there today for radiation treatment.  He states he is not sure when he began to feel short of breath and endorses low back pain but no upper back or chest pain.  Denies any nausea vomiting constipation.  Denies fevers chills, cough, wheezing, hemoptysis, leg swelling.  He states he has been Compliant with his Eliquis.  On arrival patient is in mild respiratory distress and tachycardic into the 120s.  Saturating high 80s or 90% on room air and placed on 2 L which he states helps his shortness of breath some.  Past Medical History:  Diagnosis Date   Blurred vision, left eye    since stroke 06/2020   CVA (cerebral vascular accident) (HCC) 06/2020   DDD (degenerative disc disease), lumbar    Diabetes mellitus without complication (HCC)    History of radiation therapy    Right lung- 07/17/22-08/28/22- Dr. Antony Blackbird   Hyperlipidemia    Hypertension    Hypomagnesemia    Polio    childhood   Vitamin B 12 deficiency        Home Medications Prior to Admission medications   Medication Sig Start Date End Date Taking? Authorizing Provider  amLODipine (NORVASC) 10 MG tablet Take 0.5 tablets (5 mg total) by mouth daily. 01/08/23 02/07/23  Alwyn Ren, MD  apixaban (ELIQUIS) 5 MG TABS tablet  Take 1 tablet (5 mg total) by mouth 2 (two) times daily. 01/08/23   Alwyn Ren, MD  atorvastatin (LIPITOR) 20 MG tablet Take 20 mg by mouth in the morning. 04/06/22   [provider]  dexamethasone (DECADRON) 4 MG tablet Take half a tablet daily 01/09/23   Alwyn Ren, MD  lidocaine (LIDODERM) 5 % Place 1 patch onto the skin daily. Remove & Discard patch within 12 hours or as directed by MD 01/08/23   Alwyn Ren, MD  metFORMIN (GLUCOPHAGE) 500 MG tablet Take 500 mg by mouth in the morning.    [provider]  metoprolol succinate (TOPROL-XL) 25 MG 24 hr tablet Take 25 mg by mouth daily. 06/20/22   [provider]  mirtazapine (REMERON SOL-TAB) 15 MG disintegrating tablet Take 0.5 tablets (7.5 mg total) by mouth at bedtime. 01/08/23   Alwyn Ren, MD  Omega-3 Fatty Acids (OMEGA-3 PO) Take 2 capsules by mouth in the morning. OmegaXL Joint Pain Relief & Inflammation Supplement    [provider]  oxyCODONE 10 MG TABS Take 1 tablet (10 mg total) by mouth every 4 (four) hours as needed for moderate pain. 01/08/23   Alwyn Ren, MD  polyethylene glycol (MIRALAX / GLYCOLAX) 17 g packet Take 17 g by mouth daily. 01/08/23   Alwyn Ren, MD      Allergies    Patient has no  [facility] AT [facility] Provider Note   CSN: [id number] Arrival date & time: 01/28/2023  1455     History  Chief Complaint  Patient presents with   Shortness of Breath    Gavin Dennis is a 72 y.o. male with lung cancer metastatic to liver, neutropenia, history of CVA, history of PE, T2DM, emphysema, ascending aortic aneurysm who is brought over from cancer center c/o sob, low o2 and tachycardia.  Per chart review patient was recently admitted from 12/27/2022 to 01/08/2023 found to be tachycardic likely SVT and diagnosed with acute PE.  Started on heparin.  Also had diarrhea thought to be related to Ensure.  Patient currently undergoing aggressive treatment.  He was there today for radiation treatment.  He states he is not sure when he began to feel short of breath and endorses low back pain but no upper back or chest pain.  Denies any nausea vomiting constipation.  Denies fevers chills, cough, wheezing, hemoptysis, leg swelling.  He states he has been Compliant with his Eliquis.  On arrival patient is in mild respiratory distress and tachycardic into the 120s.  Saturating high 80s or 90% on room air and placed on 2 L which he states helps his shortness of breath some.  Past Medical History:  Diagnosis Date   Blurred vision, left eye    since stroke 06/2020   CVA (cerebral vascular accident) (HCC) 06/2020   DDD (degenerative disc disease), lumbar    Diabetes mellitus without complication (HCC)    History of radiation therapy    Right lung- 07/17/22-08/28/22- Dr. Antony Blackbird   Hyperlipidemia    Hypertension    Hypomagnesemia    Polio    childhood   Vitamin B 12 deficiency        Home Medications Prior to Admission medications   Medication Sig Start Date End Date Taking? Authorizing Provider  amLODipine (NORVASC) 10 MG tablet Take 0.5 tablets (5 mg total) by mouth daily. 01/08/23 02/07/23  Alwyn Ren, MD  apixaban (ELIQUIS) 5 MG TABS tablet  Take 1 tablet (5 mg total) by mouth 2 (two) times daily. 01/08/23   Alwyn Ren, MD  atorvastatin (LIPITOR) 20 MG tablet Take 20 mg by mouth in the morning. 04/06/22   [provider]  dexamethasone (DECADRON) 4 MG tablet Take half a tablet daily 01/09/23   Alwyn Ren, MD  lidocaine (LIDODERM) 5 % Place 1 patch onto the skin daily. Remove & Discard patch within 12 hours or as directed by MD 01/08/23   Alwyn Ren, MD  metFORMIN (GLUCOPHAGE) 500 MG tablet Take 500 mg by mouth in the morning.    [provider]  metoprolol succinate (TOPROL-XL) 25 MG 24 hr tablet Take 25 mg by mouth daily. 06/20/22   [provider]  mirtazapine (REMERON SOL-TAB) 15 MG disintegrating tablet Take 0.5 tablets (7.5 mg total) by mouth at bedtime. 01/08/23   Alwyn Ren, MD  Omega-3 Fatty Acids (OMEGA-3 PO) Take 2 capsules by mouth in the morning. OmegaXL Joint Pain Relief & Inflammation Supplement    [provider]  oxyCODONE 10 MG TABS Take 1 tablet (10 mg total) by mouth every 4 (four) hours as needed for moderate pain. 01/08/23   Alwyn Ren, MD  polyethylene glycol (MIRALAX / GLYCOLAX) 17 g packet Take 17 g by mouth daily. 01/08/23   Alwyn Ren, MD      Allergies    Patient has no  [facility] AT [facility] Provider Note   CSN: [id number] Arrival date & time: 01/28/2023  1455     History  Chief Complaint  Patient presents with   Shortness of Breath    Gavin Dennis is a 72 y.o. male with lung cancer metastatic to liver, neutropenia, history of CVA, history of PE, T2DM, emphysema, ascending aortic aneurysm who is brought over from cancer center c/o sob, low o2 and tachycardia.  Per chart review patient was recently admitted from 12/27/2022 to 01/08/2023 found to be tachycardic likely SVT and diagnosed with acute PE.  Started on heparin.  Also had diarrhea thought to be related to Ensure.  Patient currently undergoing aggressive treatment.  He was there today for radiation treatment.  He states he is not sure when he began to feel short of breath and endorses low back pain but no upper back or chest pain.  Denies any nausea vomiting constipation.  Denies fevers chills, cough, wheezing, hemoptysis, leg swelling.  He states he has been Compliant with his Eliquis.  On arrival patient is in mild respiratory distress and tachycardic into the 120s.  Saturating high 80s or 90% on room air and placed on 2 L which he states helps his shortness of breath some.  Past Medical History:  Diagnosis Date   Blurred vision, left eye    since stroke 06/2020   CVA (cerebral vascular accident) (HCC) 06/2020   DDD (degenerative disc disease), lumbar    Diabetes mellitus without complication (HCC)    History of radiation therapy    Right lung- 07/17/22-08/28/22- Dr. Antony Blackbird   Hyperlipidemia    Hypertension    Hypomagnesemia    Polio    childhood   Vitamin B 12 deficiency        Home Medications Prior to Admission medications   Medication Sig Start Date End Date Taking? Authorizing Provider  amLODipine (NORVASC) 10 MG tablet Take 0.5 tablets (5 mg total) by mouth daily. 01/08/23 02/07/23  Alwyn Ren, MD  apixaban (ELIQUIS) 5 MG TABS tablet  Take 1 tablet (5 mg total) by mouth 2 (two) times daily. 01/08/23   Alwyn Ren, MD  atorvastatin (LIPITOR) 20 MG tablet Take 20 mg by mouth in the morning. 04/06/22   [provider]  dexamethasone (DECADRON) 4 MG tablet Take half a tablet daily 01/09/23   Alwyn Ren, MD  lidocaine (LIDODERM) 5 % Place 1 patch onto the skin daily. Remove & Discard patch within 12 hours or as directed by MD 01/08/23   Alwyn Ren, MD  metFORMIN (GLUCOPHAGE) 500 MG tablet Take 500 mg by mouth in the morning.    [provider]  metoprolol succinate (TOPROL-XL) 25 MG 24 hr tablet Take 25 mg by mouth daily. 06/20/22   [provider]  mirtazapine (REMERON SOL-TAB) 15 MG disintegrating tablet Take 0.5 tablets (7.5 mg total) by mouth at bedtime. 01/08/23   Alwyn Ren, MD  Omega-3 Fatty Acids (OMEGA-3 PO) Take 2 capsules by mouth in the morning. OmegaXL Joint Pain Relief & Inflammation Supplement    [provider]  oxyCODONE 10 MG TABS Take 1 tablet (10 mg total) by mouth every 4 (four) hours as needed for moderate pain. 01/08/23   Alwyn Ren, MD  polyethylene glycol (MIRALAX / GLYCOLAX) 17 g packet Take 17 g by mouth daily. 01/08/23   Alwyn Ren, MD      Allergies    Patient has no  [facility] AT [facility] Provider Note   CSN: [id number] Arrival date & time: 01/04/2023  1455     History  Chief Complaint  Patient presents with   Shortness of Breath    Gavin Dennis is a 72 y.o. male with lung cancer metastatic to liver, neutropenia, history of CVA, history of PE, T2DM, emphysema, ascending aortic aneurysm who is brought over from cancer center c/o sob, low o2 and tachycardia.  Per chart review patient was recently admitted from 12/27/2022 to 01/08/2023 found to be tachycardic likely SVT and diagnosed with acute PE.  Started on heparin.  Also had diarrhea thought to be related to Ensure.  Patient currently undergoing aggressive treatment.  He was there today for radiation treatment.  He states he is not sure when he began to feel short of breath and endorses low back pain but no upper back or chest pain.  Denies any nausea vomiting constipation.  Denies fevers chills, cough, wheezing, hemoptysis, leg swelling.  He states he has been Compliant with his Eliquis.  On arrival patient is in mild respiratory distress and tachycardic into the 120s.  Saturating high 80s or 90% on room air and placed on 2 L which he states helps his shortness of breath some.  Past Medical History:  Diagnosis Date   Blurred vision, left eye    since stroke 06/2020   CVA (cerebral vascular accident) (HCC) 06/2020   DDD (degenerative disc disease), lumbar    Diabetes mellitus without complication (HCC)    History of radiation therapy    Right lung- 07/17/22-08/28/22- Dr. Antony Blackbird   Hyperlipidemia    Hypertension    Hypomagnesemia    Polio    childhood   Vitamin B 12 deficiency        Home Medications Prior to Admission medications   Medication Sig Start Date End Date Taking? Authorizing Provider  amLODipine (NORVASC) 10 MG tablet Take 0.5 tablets (5 mg total) by mouth daily. 01/08/23 02/07/23  Alwyn Ren, MD  apixaban (ELIQUIS) 5 MG TABS tablet  Take 1 tablet (5 mg total) by mouth 2 (two) times daily. 01/08/23   Alwyn Ren, MD  atorvastatin (LIPITOR) 20 MG tablet Take 20 mg by mouth in the morning. 04/06/22   [provider]  dexamethasone (DECADRON) 4 MG tablet Take half a tablet daily 01/09/23   Alwyn Ren, MD  lidocaine (LIDODERM) 5 % Place 1 patch onto the skin daily. Remove & Discard patch within 12 hours or as directed by MD 01/08/23   Alwyn Ren, MD  metFORMIN (GLUCOPHAGE) 500 MG tablet Take 500 mg by mouth in the morning.    [provider]  metoprolol succinate (TOPROL-XL) 25 MG 24 hr tablet Take 25 mg by mouth daily. 06/20/22   [provider]  mirtazapine (REMERON SOL-TAB) 15 MG disintegrating tablet Take 0.5 tablets (7.5 mg total) by mouth at bedtime. 01/08/23   Alwyn Ren, MD  Omega-3 Fatty Acids (OMEGA-3 PO) Take 2 capsules by mouth in the morning. OmegaXL Joint Pain Relief & Inflammation Supplement    [provider]  oxyCODONE 10 MG TABS Take 1 tablet (10 mg total) by mouth every 4 (four) hours as needed for moderate pain. 01/08/23   Alwyn Ren, MD  polyethylene glycol (MIRALAX / GLYCOLAX) 17 g packet Take 17 g by mouth daily. 01/08/23   Alwyn Ren, MD      Allergies    Patient has no

## 2023-01-10 NOTE — Telephone Encounter (Signed)
Pt's daughter called to advise they were running behind but were on the way here for treatment. I called [facility] to advise of this delay.

## 2023-01-10 NOTE — H&P (Signed)
History and Physical    Gavin Dennis ZOX:[id number] DOB: [birth year] DOA: 01/03/2023  PCP: Ollen Bowl, MD   Patient coming from: Home, step daughter lives with her  Chief Complaint  Patient presents with   Shortness of Breath      HPI: 72 year old male with history of lung cancer with mets to liver/ribs/scapula/vertebrae, was offered palliative/hospice services but patient declined and wanted to pursue further treatment, neutropenia history of CVA, hypertension, history of PE, T2DM, ascending aortic aneurysm, emphysema recent hospitalization 8/28 - 9/9 for tachycardia/SVT/acute PE, diarrhea sent from radiation oncology on 822 mid to low back pain, shortness of breath. Currently he denies any pain complains of being cold mild cough.  Denies any fever chills dysuria abdominal pain nausea vomiting.  In the WJ:XBJYNW tachycardia in 120s, saturating 88% on percent needing 2 L nasal cannula, BP stable in 90s to 120s Labs: Blood gas pH 7.34, pCO2 43 pO2 less than 31, BMP with stable renal function BUN 30 calcium 10.5 lactic acid 4.1, hemoglobin 15 normal WBC count blood sugar in 2012 influenza COVID screening negative. Imaging: CT angio no PE resolution of previous PE in the left upper lobe, small to moderate effusion on the right, posttreatment markings on the right lung, hilar and mediastinal adenopathy, liver metastasis, lytic lesion on left scapula and left heart rate, right fifth rib, inferior right scapula, T9, T10, T12, aortic atherosclerosis. Blood culture sent, given IV fluid bolus, DuoNeb and troponin ordered and admission was requested for further management   Assessment/Plan Principal Problem:   Uncontrolled pain  Dyspnea/acute respiratory insufficiency and mild respiratory distress Small to moderate pleural effusion on right Lung cancer with mets to liver/ribs/scapula/vertebrae: Cancer-related pain: Patient presentation suspect due to his lung cancer with mets.  No obvious  pneumonia, no PE has mild effusion.he is currently on palliative radiation, recently he was offered palliative/hospice services but patient declined and wanted to pursue further treatment.  Will start on pain management oral IV opiates, scheduled Tylenol, consult palliative care, will need to notify his rad/oncology in the morning.  No indication for thoracentesis, repeat chest x-ray in the morning.  Continue bronchodilators as needed, supplemental oxygen.  Overall poor prognosis  SIRS: w/ tachycarda with left shift Lactic acidosis: Elevated lactic of 4.1> downtrending to 2.6-patient hemodynamically stable, and no obvious signs of infection afebrile, no evidence of pneumonia on the CT scan, no leukocytosis, check UA. Suspect lactic acidosis is due to combination of dehydration-hypercalcemia/hepatic metastases with delayed clearance, possibly metformin use complicating the picture.  Hold metformin, will continue on IV fluids and trend LA. follow-up blood culture for completeness.  Hypercalcemia of malignancy: Continue IV fluid hydration and monitor  Recent PE: Will continue Eliquis  History of stroke: Recently Plavix was stopped as he was placed on anticoagulation.  Continue statin.  Hypertension: BP stable to soft hold meds, continue IV fluid hydration  T2DM: Patient is BUN 6.7, add sliding scale insulin hold his metformin.  Ascending aortic aneurysm Aortic atherosclerosis: He has aneurysm on thorax 4.6 continue to do semiannual imaging and 3.6 cm infrarenal aneurysm which needs follow-up every 2 years   GOC: patient of note was DNR on last admission requested to be full code palliative care has been consulted, overall prognosis appears poor discussed with the patient  Body mass index is 23.68 kg/m.   Severity of Illness: The appropriate patient status for this patient is OBSERVATION. Observation status is judged to be reasonable and necessary in order to provide the required  intensity  History and Physical    Gavin Dennis ZOX:[id number] DOB: [birth year] DOA: 01/03/2023  PCP: Ollen Bowl, MD   Patient coming from: Home, step daughter lives with her  Chief Complaint  Patient presents with   Shortness of Breath      HPI: 72 year old male with history of lung cancer with mets to liver/ribs/scapula/vertebrae, was offered palliative/hospice services but patient declined and wanted to pursue further treatment, neutropenia history of CVA, hypertension, history of PE, T2DM, ascending aortic aneurysm, emphysema recent hospitalization 8/28 - 9/9 for tachycardia/SVT/acute PE, diarrhea sent from radiation oncology on 822 mid to low back pain, shortness of breath. Currently he denies any pain complains of being cold mild cough.  Denies any fever chills dysuria abdominal pain nausea vomiting.  In the WJ:XBJYNW tachycardia in 120s, saturating 88% on percent needing 2 L nasal cannula, BP stable in 90s to 120s Labs: Blood gas pH 7.34, pCO2 43 pO2 less than 31, BMP with stable renal function BUN 30 calcium 10.5 lactic acid 4.1, hemoglobin 15 normal WBC count blood sugar in 2012 influenza COVID screening negative. Imaging: CT angio no PE resolution of previous PE in the left upper lobe, small to moderate effusion on the right, posttreatment markings on the right lung, hilar and mediastinal adenopathy, liver metastasis, lytic lesion on left scapula and left heart rate, right fifth rib, inferior right scapula, T9, T10, T12, aortic atherosclerosis. Blood culture sent, given IV fluid bolus, DuoNeb and troponin ordered and admission was requested for further management   Assessment/Plan Principal Problem:   Uncontrolled pain  Dyspnea/acute respiratory insufficiency and mild respiratory distress Small to moderate pleural effusion on right Lung cancer with mets to liver/ribs/scapula/vertebrae: Cancer-related pain: Patient presentation suspect due to his lung cancer with mets.  No obvious  pneumonia, no PE has mild effusion.he is currently on palliative radiation, recently he was offered palliative/hospice services but patient declined and wanted to pursue further treatment.  Will start on pain management oral IV opiates, scheduled Tylenol, consult palliative care, will need to notify his rad/oncology in the morning.  No indication for thoracentesis, repeat chest x-ray in the morning.  Continue bronchodilators as needed, supplemental oxygen.  Overall poor prognosis  SIRS: w/ tachycarda with left shift Lactic acidosis: Elevated lactic of 4.1> downtrending to 2.6-patient hemodynamically stable, and no obvious signs of infection afebrile, no evidence of pneumonia on the CT scan, no leukocytosis, check UA. Suspect lactic acidosis is due to combination of dehydration-hypercalcemia/hepatic metastases with delayed clearance, possibly metformin use complicating the picture.  Hold metformin, will continue on IV fluids and trend LA. follow-up blood culture for completeness.  Hypercalcemia of malignancy: Continue IV fluid hydration and monitor  Recent PE: Will continue Eliquis  History of stroke: Recently Plavix was stopped as he was placed on anticoagulation.  Continue statin.  Hypertension: BP stable to soft hold meds, continue IV fluid hydration  T2DM: Patient is BUN 6.7, add sliding scale insulin hold his metformin.  Ascending aortic aneurysm Aortic atherosclerosis: He has aneurysm on thorax 4.6 continue to do semiannual imaging and 3.6 cm infrarenal aneurysm which needs follow-up every 2 years   GOC: patient of note was DNR on last admission requested to be full code palliative care has been consulted, overall prognosis appears poor discussed with the patient  Body mass index is 23.68 kg/m.   Severity of Illness: The appropriate patient status for this patient is OBSERVATION. Observation status is judged to be reasonable and necessary in order to provide the required  intensity  Discard patch within 12 hours or as directed by MD 01/08/23   Alwyn Ren, MD  metFORMIN (GLUCOPHAGE) 500 MG tablet Take 500 mg by mouth in the morning.    [provider]  metoprolol succinate (TOPROL-XL) 25 MG 24 hr tablet Take 25 mg by mouth daily. 06/20/22   [provider]  mirtazapine (REMERON SOL-TAB) 15 MG disintegrating tablet Take 0.5  tablets (7.5 mg total) by mouth at bedtime. 01/08/23   Alwyn Ren, MD  Omega-3 Fatty Acids (OMEGA-3 PO) Take 2 capsules by mouth in the morning. OmegaXL Joint Pain Relief & Inflammation Supplement    [provider]  oxyCODONE 10 MG TABS Take 1 tablet (10 mg total) by mouth every 4 (four) hours as needed for moderate pain. 01/08/23   Alwyn Ren, MD  polyethylene glycol (MIRALAX / GLYCOLAX) 17 g packet Take 17 g by mouth daily. 01/08/23   Alwyn Ren, MD    Physical Exam: Vitals:   01/06/2023 1800 01/13/2023 1815 01/01/2023 1850 01/15/2023 1909  BP: (!) 122/90 (!) 124/102  (!) 129/93  Pulse:  93  65  Resp:  20  12  Temp:   (!) 97.5 F (36.4 C) 98.3 F (36.8 C)  TempSrc:   Oral   SpO2:  96%  100%  Weight:      Height:        General exam: AAOx3, appears older for his age, thin frail HEENT:Oral mucosa moist, Ear/Nose WNL grossly, dentition normal. Respiratory system: bilaterally clear,no wheezing or crackles,no use of accessory muscle Cardiovascular system: S1 & S2 +, No JVD,. Gastrointestinal system: Abdomen soft, NT,ND, BS+ Nervous System:Alert, awake, moving extremities and grossly nonfocal Extremities: No edema, distal peripheral pulses palpable.  Skin: No rashes,no icterus. Cool extremities MSK: Normal muscle bulk,tone, power   Labs on Admission: I have personally reviewed following labs and imaging studies  CBC: Recent Labs  Lab 01/06/23 0458 01/06/23 1401 01/12/2023 1512 01/08/2023 1627  WBC 11.0* 10.1 9.9  --   NEUTROABS  --   --  9.2*  --   HGB 13.0 13.4 15.1 15.0  HCT 37.9* 39.2 45.9 44.0  MCV 96.2 97.3 100.0  --   PLT 164 168 110*  --    Basic Metabolic Panel: Recent Labs  Lab 01/06/23 1401 01/07/23 0455 01/11/2023 1512 01/09/2023 1627  NA 136 137 136 137  K 4.0 4.3 4.0 4.4  CL 102 103 101 104  CO2 24 22 19*  --   GLUCOSE 134* 120* 227* 212*  BUN 22 21 30* 30*  CREATININE 0.76 0.70 1.14 0.90  CALCIUM 9.8 10.3 10.5*  --     GFR: Estimated Creatinine Clearance: 81.3 mL/min (by C-G formula based on SCr of 0.9 mg/dL). Liver Function Tests: Recent Labs  Lab 01/06/23 1401 01/07/23 0455 01/25/2023 1512  AST 13* 13* 23  ALT 24 24 31   ALKPHOS 97 100 118  BILITOT 1.6* 1.4* 1.9*  PROT 5.7* 5.9* 7.1  ALBUMIN 2.7* 2.7* 3.2*   No results for input(s): "HGBA1C" in the last 72 hours. CBG: Recent Labs  Lab 01/07/23 1324 01/07/23 2056 01/08/23 0730 01/08/23 1144 01/08/23 1650  GLUCAP 146* 159* 115* 111* 168*   Urine analysis:    Component Value Date/Time   COLORURINE YELLOW 03/12/2022 1027   APPEARANCEUR CLEAR 03/12/2022 1027   LABSPEC 1.035 (H) 03/12/2022 1027   PHURINE 7.0 03/12/2022 1027   GLUCOSEU NEGATIVE 03/12/2022 1027   HGBUR MODERATE (A) 03/12/2022 1027   BILIRUBINUR  History and Physical    Gavin Dennis ZOX:[id number] DOB: [birth year] DOA: 01/03/2023  PCP: Ollen Bowl, MD   Patient coming from: Home, step daughter lives with her  Chief Complaint  Patient presents with   Shortness of Breath      HPI: 72 year old male with history of lung cancer with mets to liver/ribs/scapula/vertebrae, was offered palliative/hospice services but patient declined and wanted to pursue further treatment, neutropenia history of CVA, hypertension, history of PE, T2DM, ascending aortic aneurysm, emphysema recent hospitalization 8/28 - 9/9 for tachycardia/SVT/acute PE, diarrhea sent from radiation oncology on 822 mid to low back pain, shortness of breath. Currently he denies any pain complains of being cold mild cough.  Denies any fever chills dysuria abdominal pain nausea vomiting.  In the WJ:XBJYNW tachycardia in 120s, saturating 88% on percent needing 2 L nasal cannula, BP stable in 90s to 120s Labs: Blood gas pH 7.34, pCO2 43 pO2 less than 31, BMP with stable renal function BUN 30 calcium 10.5 lactic acid 4.1, hemoglobin 15 normal WBC count blood sugar in 2012 influenza COVID screening negative. Imaging: CT angio no PE resolution of previous PE in the left upper lobe, small to moderate effusion on the right, posttreatment markings on the right lung, hilar and mediastinal adenopathy, liver metastasis, lytic lesion on left scapula and left heart rate, right fifth rib, inferior right scapula, T9, T10, T12, aortic atherosclerosis. Blood culture sent, given IV fluid bolus, DuoNeb and troponin ordered and admission was requested for further management   Assessment/Plan Principal Problem:   Uncontrolled pain  Dyspnea/acute respiratory insufficiency and mild respiratory distress Small to moderate pleural effusion on right Lung cancer with mets to liver/ribs/scapula/vertebrae: Cancer-related pain: Patient presentation suspect due to his lung cancer with mets.  No obvious  pneumonia, no PE has mild effusion.he is currently on palliative radiation, recently he was offered palliative/hospice services but patient declined and wanted to pursue further treatment.  Will start on pain management oral IV opiates, scheduled Tylenol, consult palliative care, will need to notify his rad/oncology in the morning.  No indication for thoracentesis, repeat chest x-ray in the morning.  Continue bronchodilators as needed, supplemental oxygen.  Overall poor prognosis  SIRS: w/ tachycarda with left shift Lactic acidosis: Elevated lactic of 4.1> downtrending to 2.6-patient hemodynamically stable, and no obvious signs of infection afebrile, no evidence of pneumonia on the CT scan, no leukocytosis, check UA. Suspect lactic acidosis is due to combination of dehydration-hypercalcemia/hepatic metastases with delayed clearance, possibly metformin use complicating the picture.  Hold metformin, will continue on IV fluids and trend LA. follow-up blood culture for completeness.  Hypercalcemia of malignancy: Continue IV fluid hydration and monitor  Recent PE: Will continue Eliquis  History of stroke: Recently Plavix was stopped as he was placed on anticoagulation.  Continue statin.  Hypertension: BP stable to soft hold meds, continue IV fluid hydration  T2DM: Patient is BUN 6.7, add sliding scale insulin hold his metformin.  Ascending aortic aneurysm Aortic atherosclerosis: He has aneurysm on thorax 4.6 continue to do semiannual imaging and 3.6 cm infrarenal aneurysm which needs follow-up every 2 years   GOC: patient of note was DNR on last admission requested to be full code palliative care has been consulted, overall prognosis appears poor discussed with the patient  Body mass index is 23.68 kg/m.   Severity of Illness: The appropriate patient status for this patient is OBSERVATION. Observation status is judged to be reasonable and necessary in order to provide the required  intensity

## 2023-01-10 NOTE — ED Notes (Signed)
ED TO INPATIENT HANDOFF REPORT  Name/Age/Gender Gavin Dennis 72 y.o. male  Code Status Code Status History     Date Active Date Inactive Code Status Order ID Comments User Context   01/04/2023 2006 01/09/2023 0131 Limited: Do not attempt resuscitation (DNR) -DNR-LIMITED -Do Not Intubate/DNI  [id number]  Merry Proud, NP Inpatient   12/27/2022 1733 01/04/2023 2006 Full Code [id number]  Nolberto Hanlon, MD ED   03/13/2022 0940 03/15/2022 2310 Full Code [id number]  Emeline General, MD ED   07/09/2020 1505 07/12/2020 0049 Full Code [id number]  Sabino Dick, DO ED    Questions for Most Recent Historical Code Status (Order [id number])     Question Answer   If pulseless and not breathing No CPR or chest compressions.   In Pre-Arrest Conditions (Patient Is Breathing and Has A Pulse) Do not intubate. Provide all appropriate non-invasive medical interventions. Avoid ICU transfer unless indicated or required.   Consent: Discussion documented in EHR or advanced directives reviewed            Home/SNF/Other Home  Chief Complaint Uncontrolled pain [R52]  Level of Care/Admitting Diagnosis ED Disposition     ED Disposition  Admit   Condition  --   Comment  Hospital Area: [facility] [[id number]]  Level of Care: Telemetry [5]  Admit to tele based on following criteria: Other see comments  Comments: uncontrolled pain  May place patient in observation at [facility] or [facility] if equivalent level of care is available:: No  Covid Evaluation: Asymptomatic - no recent exposure (last 10 days) testing not required  Diagnosis: Uncontrolled pain [721020]  Admitting Physician: Lanae Boast [[id number]]  Attending Physician: Lanae Boast [[id number]]          Medical History Past Medical History:  Diagnosis Date   Blurred vision, left eye    since stroke 06/2020   CVA (cerebral vascular accident) (HCC) 06/2020   DDD (degenerative disc disease), lumbar    Diabetes mellitus  without complication (HCC)    History of radiation therapy    Right lung- 07/17/22-08/28/22- Dr. Antony Blackbird   Hyperlipidemia    Hypertension    Hypomagnesemia    Polio    childhood   Vitamin B 12 deficiency     Allergies No Known Allergies  IV Location/Drains/Wounds Patient Lines/Drains/Airways Status     Active Line/Drains/Airways     Name Placement date Placement time Site Days   Peripheral IV 01/14/2023 20 G Right Antecubital 01/11/2023  1533  Antecubital  less than 1   Peripheral IV 01/13/2023 20 G Anterior;Left;Proximal Forearm 01/27/2023  1554  Forearm  less than 1            Labs/Imaging Results for orders placed or performed during the hospital encounter of 01/06/2023 (from the past 48 hour(s))  Comprehensive metabolic panel     Status: Abnormal   Collection Time: 01/05/2023  3:12 PM  Result Value Ref Range   Sodium 136 135 - 145 mmol/L   Potassium 4.0 3.5 - 5.1 mmol/L   Chloride 101 98 - 111 mmol/L   CO2 19 (L) 22 - 32 mmol/L   Glucose, Bld 227 (H) 70 - 99 mg/dL    Comment: Glucose reference range applies only to samples taken after fasting for at least 8 hours.   BUN 30 (H) 8 - 23 mg/dL   Creatinine, Ser 4.03 0.61 - 1.24 mg/dL   Calcium 47.4 (H) 8.9 - 10.3 mg/dL   Total Protein 7.1 6.5 -  2)  BLOOD CULTURE X 2,   R (with STAT occurrences)      01/03/2023 1528   Pending  Comprehensive metabolic panel  Tomorrow  morning,   R        Pending   Pending  CBC  Tomorrow morning,   R        Pending            Vitals/Pain Today's Vitals   01/26/2023 1800 01/14/2023 1815 01/29/2023 1850 01/01/2023 1909  BP: (!) 122/90 (!) 124/102  (!) 129/93  Pulse:  93  65  Resp:  20  12  Temp:   (!) 97.5 F (36.4 C) 98.3 F (36.8 C)  TempSrc:   Oral   SpO2:  96%  100%  Weight:      Height:      PainSc:        Isolation Precautions No active isolations  Medications Medications  ipratropium-albuterol (DUONEB) 0.5-2.5 (3) MG/3ML nebulizer solution 3 mL (3 mLs Nebulization Given 01/27/2023 1541)  iohexol (OMNIPAQUE) 350 MG/ML injection 80 mL (80 mLs Intravenous Contrast Given 01/06/2023 1619)  lactated ringers bolus 500 mL (500 mLs Intravenous New Bag/Given 01/25/2023 1836)    Mobility walks with person assist  8.1 g/dL   Albumin 3.2 (L) 3.5 - 5.0 g/dL   AST 23 15 - 41 U/L   ALT 31 0 - 44 U/L   Alkaline Phosphatase 118 38 - 126 U/L   Total Bilirubin 1.9 (H) 0.3 - 1.2 mg/dL   GFR, Estimated >86 >57 mL/min    Comment: (NOTE) Calculated using the CKD-EPI Creatinine Equation (2021)    Anion gap 16 (H) 5 - 15    Comment: Performed at [facility], [address] [place], [place] [address]  Brain natriuretic peptide     Status: None   Collection Time: 01/23/2023  3:12 PM  Result Value Ref Range   B Natriuretic Peptide 38.3 0.0 - 100.0 pg/mL    Comment: Performed at [facility], [address], [place], [place] [address]  CBC with  Differential/Platelet     Status: Abnormal   Collection Time: 01/08/2023  3:12 PM  Result Value Ref Range   WBC 9.9 4.0 - 10.5 K/uL   RBC 4.59 4.22 - 5.81 MIL/uL   Hemoglobin 15.1 13.0 - 17.0 g/dL   HCT 41.3 24.4 - 01.0 %   MCV 100.0 80.0 - 100.0 fL   MCH 32.9 26.0 - 34.0 pg   MCHC 32.9 30.0 - 36.0 g/dL   RDW 27.2 53.6 - 64.4 %   Platelets 110 (L) 150 - 400 K/uL   nRBC 0.0 0.0 - 0.2 %   Neutrophils Relative % 92 %   Neutro Abs 9.2 (H) 1.7 - 7.7 K/uL   Lymphocytes Relative 2 %   Lymphs Abs 0.2 (L) 0.7 - 4.0 K/uL   Monocytes Relative 4 %   Monocytes Absolute 0.4 0.1 - 1.0 K/uL   Eosinophils Relative 1 %   Eosinophils Absolute 0.1 0.0 - 0.5 K/uL   Basophils Relative 0 %   Basophils Absolute 0.0 0.0 - 0.1 K/uL   Immature Granulocytes 1 %   Abs Immature Granulocytes 0.12 (H) 0.00 - 0.07 K/uL    Comment: Performed at [facility], [address], [place], [place] [address]  Troponin I (High Sensitivity)     Status: Abnormal   Collection Time: 01/15/2023  3:12 PM  Result Value Ref Range   Troponin I (High Sensitivity) 62 (H) <18 ng/L    Comment: (NOTE) Elevated high sensitivity troponin I (hsTnI) values and significant  changes across serial measurements may suggest ACS but many other  chronic and acute conditions are known to elevate hsTnI results.  Refer to the "Links" section for chest pain algorithms and additional  guidance. Performed at [facility], [address] [place], [place] [address]   Resp panel by RT-PCR (RSV, Flu A&B, Covid) Anterior Nasal Swab     Status: None   Collection Time: 01/20/2023  4:04 PM   Specimen: Anterior Nasal Swab  Result Value Ref Range   SARS Coronavirus 2 by RT PCR NEGATIVE NEGATIVE    Comment: (NOTE) SARS-CoV-2 target nucleic acids are NOT DETECTED.  The SARS-CoV-2 RNA is generally detectable in upper respiratory specimens during the acute phase of infection. The lowest concentration of SARS-CoV-2 viral  copies this assay can detect is 138 copies/mL. A negative result does not preclude SARS-Cov-2 infection and should not be used as the sole basis for treatment or other patient management decisions. A negative result may occur with  improper specimen collection/handling, submission of specimen other than nasopharyngeal swab, presence of viral mutation(s) within the areas targeted by this assay,  ED TO INPATIENT HANDOFF REPORT  Name/Age/Gender Gavin Dennis 72 y.o. male  Code Status Code Status History     Date Active Date Inactive Code Status Order ID Comments User Context   01/04/2023 2006 01/09/2023 0131 Limited: Do not attempt resuscitation (DNR) -DNR-LIMITED -Do Not Intubate/DNI  [id number]  Merry Proud, NP Inpatient   12/27/2022 1733 01/04/2023 2006 Full Code [id number]  Nolberto Hanlon, MD ED   03/13/2022 0940 03/15/2022 2310 Full Code [id number]  Emeline General, MD ED   07/09/2020 1505 07/12/2020 0049 Full Code [id number]  Sabino Dick, DO ED    Questions for Most Recent Historical Code Status (Order [id number])     Question Answer   If pulseless and not breathing No CPR or chest compressions.   In Pre-Arrest Conditions (Patient Is Breathing and Has A Pulse) Do not intubate. Provide all appropriate non-invasive medical interventions. Avoid ICU transfer unless indicated or required.   Consent: Discussion documented in EHR or advanced directives reviewed            Home/SNF/Other Home  Chief Complaint Uncontrolled pain [R52]  Level of Care/Admitting Diagnosis ED Disposition     ED Disposition  Admit   Condition  --   Comment  Hospital Area: [facility] [[id number]]  Level of Care: Telemetry [5]  Admit to tele based on following criteria: Other see comments  Comments: uncontrolled pain  May place patient in observation at [facility] or [facility] if equivalent level of care is available:: No  Covid Evaluation: Asymptomatic - no recent exposure (last 10 days) testing not required  Diagnosis: Uncontrolled pain [721020]  Admitting Physician: Lanae Boast [[id number]]  Attending Physician: Lanae Boast [[id number]]          Medical History Past Medical History:  Diagnosis Date   Blurred vision, left eye    since stroke 06/2020   CVA (cerebral vascular accident) (HCC) 06/2020   DDD (degenerative disc disease), lumbar    Diabetes mellitus  without complication (HCC)    History of radiation therapy    Right lung- 07/17/22-08/28/22- Dr. Antony Blackbird   Hyperlipidemia    Hypertension    Hypomagnesemia    Polio    childhood   Vitamin B 12 deficiency     Allergies No Known Allergies  IV Location/Drains/Wounds Patient Lines/Drains/Airways Status     Active Line/Drains/Airways     Name Placement date Placement time Site Days   Peripheral IV 01/14/2023 20 G Right Antecubital 01/11/2023  1533  Antecubital  less than 1   Peripheral IV 01/13/2023 20 G Anterior;Left;Proximal Forearm 01/27/2023  1554  Forearm  less than 1            Labs/Imaging Results for orders placed or performed during the hospital encounter of 01/06/2023 (from the past 48 hour(s))  Comprehensive metabolic panel     Status: Abnormal   Collection Time: 01/05/2023  3:12 PM  Result Value Ref Range   Sodium 136 135 - 145 mmol/L   Potassium 4.0 3.5 - 5.1 mmol/L   Chloride 101 98 - 111 mmol/L   CO2 19 (L) 22 - 32 mmol/L   Glucose, Bld 227 (H) 70 - 99 mg/dL    Comment: Glucose reference range applies only to samples taken after fasting for at least 8 hours.   BUN 30 (H) 8 - 23 mg/dL   Creatinine, Ser 4.03 0.61 - 1.24 mg/dL   Calcium 47.4 (H) 8.9 - 10.3 mg/dL   Total Protein 7.1 6.5 -  8.1 g/dL   Albumin 3.2 (L) 3.5 - 5.0 g/dL   AST 23 15 - 41 U/L   ALT 31 0 - 44 U/L   Alkaline Phosphatase 118 38 - 126 U/L   Total Bilirubin 1.9 (H) 0.3 - 1.2 mg/dL   GFR, Estimated >86 >57 mL/min    Comment: (NOTE) Calculated using the CKD-EPI Creatinine Equation (2021)    Anion gap 16 (H) 5 - 15    Comment: Performed at [facility], [address] [place], [place] [address]  Brain natriuretic peptide     Status: None   Collection Time: 01/23/2023  3:12 PM  Result Value Ref Range   B Natriuretic Peptide 38.3 0.0 - 100.0 pg/mL    Comment: Performed at [facility], [address], [place], [place] [address]  CBC with  Differential/Platelet     Status: Abnormal   Collection Time: 01/08/2023  3:12 PM  Result Value Ref Range   WBC 9.9 4.0 - 10.5 K/uL   RBC 4.59 4.22 - 5.81 MIL/uL   Hemoglobin 15.1 13.0 - 17.0 g/dL   HCT 41.3 24.4 - 01.0 %   MCV 100.0 80.0 - 100.0 fL   MCH 32.9 26.0 - 34.0 pg   MCHC 32.9 30.0 - 36.0 g/dL   RDW 27.2 53.6 - 64.4 %   Platelets 110 (L) 150 - 400 K/uL   nRBC 0.0 0.0 - 0.2 %   Neutrophils Relative % 92 %   Neutro Abs 9.2 (H) 1.7 - 7.7 K/uL   Lymphocytes Relative 2 %   Lymphs Abs 0.2 (L) 0.7 - 4.0 K/uL   Monocytes Relative 4 %   Monocytes Absolute 0.4 0.1 - 1.0 K/uL   Eosinophils Relative 1 %   Eosinophils Absolute 0.1 0.0 - 0.5 K/uL   Basophils Relative 0 %   Basophils Absolute 0.0 0.0 - 0.1 K/uL   Immature Granulocytes 1 %   Abs Immature Granulocytes 0.12 (H) 0.00 - 0.07 K/uL    Comment: Performed at [facility], [address], [place], [place] [address]  Troponin I (High Sensitivity)     Status: Abnormal   Collection Time: 01/15/2023  3:12 PM  Result Value Ref Range   Troponin I (High Sensitivity) 62 (H) <18 ng/L    Comment: (NOTE) Elevated high sensitivity troponin I (hsTnI) values and significant  changes across serial measurements may suggest ACS but many other  chronic and acute conditions are known to elevate hsTnI results.  Refer to the "Links" section for chest pain algorithms and additional  guidance. Performed at [facility], [address] [place], [place] [address]   Resp panel by RT-PCR (RSV, Flu A&B, Covid) Anterior Nasal Swab     Status: None   Collection Time: 01/20/2023  4:04 PM   Specimen: Anterior Nasal Swab  Result Value Ref Range   SARS Coronavirus 2 by RT PCR NEGATIVE NEGATIVE    Comment: (NOTE) SARS-CoV-2 target nucleic acids are NOT DETECTED.  The SARS-CoV-2 RNA is generally detectable in upper respiratory specimens during the acute phase of infection. The lowest concentration of SARS-CoV-2 viral  copies this assay can detect is 138 copies/mL. A negative result does not preclude SARS-Cov-2 infection and should not be used as the sole basis for treatment or other patient management decisions. A negative result may occur with  improper specimen collection/handling, submission of specimen other than nasopharyngeal swab, presence of viral mutation(s) within the areas targeted by this assay,

## 2023-01-10 NOTE — Progress Notes (Signed)
Patient was brought to the nursing clinic prior to his radiation treatment with complaints of severe pain.  Patient voiced pain to his mid to lower back.  He is currently undergoing radiation therapy to his T9-12 vertebrae.  He was recently discharged from the hospital.  Patient's daughter (available by phone) stated the patient's last dose of pain medication was 0800 today.  Verbal order received for administration of 10 mg Oxycodone PO.  Patient was noted to be having trouble with his breathing.  Vital signs were taken and noted to be BP 94/73   Pulse (!) 149   Resp 20   SpO2 (!) 81%  on room air.  Provider made aware and Oxycodone was held.  Patient was placed on 2 liters nasal cannula and oxygen level remained in the 80's.  He was then placed on facemask with Sats rising to 98% on 3 liters.  Heart rate remained elevated 120's-150's.  Provider recommended evaluation in the emergency department.  Patient transported to the ED via wheelchair.  Daughter made aware of transfer to ED.     Lind Covert RN, BSN

## 2023-01-10 NOTE — Hospital Course (Addendum)
72 year old male with history of lung cancer with mets to liver/ribs/scapula/vertebrae, was offered palliative/hospice services but patient declined and wanted to pursue further treatment, neutropenia history of CVA, hypertension, history of PE, T2DM, ascending aortic aneurysm, emphysema recent hospitalization 8/28 - 9/9 for tachycardia/SVT/acute PE, diarrhea sent from radiation oncology on 822 mid to low back pain, shortness of breath. Currently he denies any pain complains of being cold mild cough.  Denies any fever chills dysuria abdominal pain nausea vomiting.  In the ZO:XWRUEA tachycardia in 120s, saturating 88% on percent needing 2 L nasal cannula, BP stable in 90s to 120s Labs: Blood gas pH 7.34, pCO2 43 pO2 less than 31, BMP with stable renal function BUN 30 calcium 10.5 lactic acid 4.1, hemoglobin 15 normal WBC count blood sugar in 2012 influenza COVID screening negative. Imaging: CT angio no PE resolution of previous PE in the left upper lobe, small to moderate effusion on the right, posttreatment markings on the right lung, hilar and mediastinal adenopathy, liver metastasis, lytic lesion on left scapula and left heart rate, right fifth rib, inferior right scapula, T9, T10, T12, aortic atherosclerosis. Blood culture sent, given IV fluid bolus, DuoNeb and troponin ordered and admission was requested for further management

## 2023-01-11 ENCOUNTER — Ambulatory Visit
Admit: 2023-01-11 | Discharge: 2023-01-11 | Disposition: A | Discharge status: Home / Self Care | Payer: Medicare HMO | Attending: Radiation Oncology | Admitting: Radiation Oncology

## 2023-01-11 ENCOUNTER — Observation Stay (HOSPITAL_COMMUNITY): Payer: Medicare HMO

## 2023-01-11 ENCOUNTER — Other Ambulatory Visit (HOSPITAL_COMMUNITY): Payer: Self-pay

## 2023-01-11 ENCOUNTER — Other Ambulatory Visit: Payer: Self-pay

## 2023-01-11 ENCOUNTER — Encounter: Payer: Self-pay | Admitting: Internal Medicine

## 2023-01-11 DIAGNOSIS — J690 Pneumonitis due to inhalation of food and vomit: Secondary | ICD-10-CM | POA: Diagnosis not present

## 2023-01-11 DIAGNOSIS — M8458XA Pathological fracture in neoplastic disease, other specified site, initial encounter for fracture: Secondary | ICD-10-CM | POA: Diagnosis present

## 2023-01-11 DIAGNOSIS — J439 Emphysema, unspecified: Secondary | ICD-10-CM | POA: Diagnosis present

## 2023-01-11 DIAGNOSIS — R6510 Systemic inflammatory response syndrome (SIRS) of non-infectious origin without acute organ dysfunction: Secondary | ICD-10-CM | POA: Diagnosis present

## 2023-01-11 DIAGNOSIS — I959 Hypotension, unspecified: Secondary | ICD-10-CM | POA: Diagnosis present

## 2023-01-11 DIAGNOSIS — C3490 Malignant neoplasm of unspecified part of unspecified bronchus or lung: Secondary | ICD-10-CM | POA: Diagnosis not present

## 2023-01-11 DIAGNOSIS — J9601 Acute respiratory failure with hypoxia: Secondary | ICD-10-CM | POA: Diagnosis present

## 2023-01-11 DIAGNOSIS — D6859 Other primary thrombophilia: Secondary | ICD-10-CM | POA: Diagnosis present

## 2023-01-11 DIAGNOSIS — I10 Essential (primary) hypertension: Secondary | ICD-10-CM | POA: Diagnosis present

## 2023-01-11 DIAGNOSIS — G893 Neoplasm related pain (acute) (chronic): Secondary | ICD-10-CM | POA: Diagnosis present

## 2023-01-11 DIAGNOSIS — R64 Cachexia: Secondary | ICD-10-CM | POA: Diagnosis present

## 2023-01-11 DIAGNOSIS — C7951 Secondary malignant neoplasm of bone: Secondary | ICD-10-CM | POA: Diagnosis present

## 2023-01-11 DIAGNOSIS — Z7189 Other specified counseling: Secondary | ICD-10-CM | POA: Diagnosis not present

## 2023-01-11 DIAGNOSIS — I7121 Aneurysm of the ascending aorta, without rupture: Secondary | ICD-10-CM | POA: Diagnosis present

## 2023-01-11 DIAGNOSIS — I6381 Other cerebral infarction due to occlusion or stenosis of small artery: Secondary | ICD-10-CM | POA: Diagnosis not present

## 2023-01-11 DIAGNOSIS — D696 Thrombocytopenia, unspecified: Secondary | ICD-10-CM | POA: Diagnosis present

## 2023-01-11 DIAGNOSIS — Z1152 Encounter for screening for COVID-19: Secondary | ICD-10-CM | POA: Diagnosis not present

## 2023-01-11 DIAGNOSIS — T17908A Unspecified foreign body in respiratory tract, part unspecified causing other injury, initial encounter: Secondary | ICD-10-CM | POA: Diagnosis not present

## 2023-01-11 DIAGNOSIS — C801 Malignant (primary) neoplasm, unspecified: Secondary | ICD-10-CM | POA: Diagnosis not present

## 2023-01-11 DIAGNOSIS — C3491 Malignant neoplasm of unspecified part of right bronchus or lung: Secondary | ICD-10-CM | POA: Diagnosis present

## 2023-01-11 DIAGNOSIS — E119 Type 2 diabetes mellitus without complications: Secondary | ICD-10-CM | POA: Diagnosis present

## 2023-01-11 DIAGNOSIS — R52 Pain, unspecified: Secondary | ICD-10-CM | POA: Diagnosis not present

## 2023-01-11 DIAGNOSIS — R0602 Shortness of breath: Secondary | ICD-10-CM | POA: Diagnosis present

## 2023-01-11 DIAGNOSIS — Z66 Do not resuscitate: Secondary | ICD-10-CM | POA: Diagnosis present

## 2023-01-11 DIAGNOSIS — Z515 Encounter for palliative care: Secondary | ICD-10-CM | POA: Diagnosis not present

## 2023-01-11 DIAGNOSIS — C787 Secondary malignant neoplasm of liver and intrahepatic bile duct: Secondary | ICD-10-CM | POA: Diagnosis present

## 2023-01-11 DIAGNOSIS — E785 Hyperlipidemia, unspecified: Secondary | ICD-10-CM | POA: Diagnosis present

## 2023-01-11 DIAGNOSIS — J90 Pleural effusion, not elsewhere classified: Secondary | ICD-10-CM | POA: Diagnosis present

## 2023-01-11 DIAGNOSIS — E872 Acidosis, unspecified: Secondary | ICD-10-CM | POA: Diagnosis present

## 2023-01-11 DIAGNOSIS — I700 Atherosclerosis of aorta: Secondary | ICD-10-CM | POA: Diagnosis present

## 2023-01-11 DIAGNOSIS — Z681 Body mass index (BMI) 19 or less, adult: Secondary | ICD-10-CM | POA: Diagnosis not present

## 2023-01-11 DIAGNOSIS — G9389 Other specified disorders of brain: Secondary | ICD-10-CM | POA: Diagnosis not present

## 2023-01-11 DIAGNOSIS — J479 Bronchiectasis, uncomplicated: Secondary | ICD-10-CM | POA: Diagnosis not present

## 2023-01-11 LAB — COMPREHENSIVE METABOLIC PANEL
ALT: 27 U/L (ref 0–44)
AST: 21 U/L (ref 15–41)
Albumin: 2.5 g/dL — ABNORMAL LOW (ref 3.5–5.0)
Alkaline Phosphatase: 92 U/L (ref 38–126)
Anion gap: 9 (ref 5–15)
BUN: 24 mg/dL — ABNORMAL HIGH (ref 8–23)
CO2: 21 mmol/L — ABNORMAL LOW (ref 22–32)
Calcium: 9.3 mg/dL (ref 8.9–10.3)
Chloride: 108 mmol/L (ref 98–111)
Creatinine, Ser: 0.66 mg/dL (ref 0.61–1.24)
GFR, Estimated: >60 mL/min (ref >60)
Glucose, Bld: 105 mg/dL — ABNORMAL HIGH (ref 70–99)
Potassium: 3.8 mmol/L (ref 3.5–5.1)
Sodium: 138 mmol/L (ref 135–145)
Total Bilirubin: 1.5 mg/dL — ABNORMAL HIGH (ref 0.3–1.2)
Total Protein: 5.4 g/dL — ABNORMAL LOW (ref 6.5–8.1)

## 2023-01-11 LAB — RAD ONC ARIA SESSION SUMMARY
Course Elapsed Days: 8
Plan Fractions Treated to Date: 5
Plan Prescribed Dose Per Fraction: 3 Gy
Plan Total Fractions Prescribed: 10
Plan Total Prescribed Dose: 30 Gy
Reference Point Dosage Given to Date: 15 Gy
Reference Point Session Dosage Given: 3 Gy
Session Number: 5

## 2023-01-11 LAB — CBC
HCT: 39.6 % (ref 39.0–52.0)
Hemoglobin: 13.3 g/dL (ref 13.0–17.0)
MCH: 32.8 pg (ref 26.0–34.0)
MCHC: 33.6 g/dL (ref 30.0–36.0)
MCV: 97.8 fL (ref 80.0–100.0)
Platelets: 74 10*3/uL — ABNORMAL LOW (ref 150–400)
RBC: 4.05 MIL/uL — ABNORMAL LOW (ref 4.22–5.81)
RDW: 15.4 % (ref 11.5–15.5)
WBC: 9.5 10*3/uL (ref 4.0–10.5)
nRBC: 0 % (ref 0.0–0.2)

## 2023-01-11 LAB — GLUCOSE, CAPILLARY
Glucose-Capillary: 101 mg/dL — ABNORMAL HIGH (ref 70–99)
Glucose-Capillary: 110 mg/dL — ABNORMAL HIGH (ref 70–99)
Glucose-Capillary: 134 mg/dL — ABNORMAL HIGH (ref 70–99)
Glucose-Capillary: 128 mg/dL — ABNORMAL HIGH (ref 70–99)

## 2023-01-11 LAB — LACTIC ACID, PLASMA
Lactic Acid, Venous: 3.1 mmol/L (ref 0.5–1.9)
Lactic Acid, Venous: 1.8 mmol/L (ref 0.5–1.9)

## 2023-01-11 MED ORDER — SODIUM CHLORIDE 0.9 % IV BOLUS
500.0000 mL | Freq: Once | INTRAVENOUS | Status: AC
  Administered 2023-01-11: 500 mL via INTRAVENOUS

## 2023-01-11 NOTE — Progress Notes (Signed)
Mr. Bilinski is a 72 y.o. gentleman who has a history of metastatic lung cancer receiving palliative radiation to his T spine to treat metastatic disease. He has received 4 of the 10 planned fractions and was seen in clinic. His pain is not controlled and he has not taken his medication for pain since this morning. He was dropped off by family for his treatment, and is alone today. We saw him in clinic to consider administering pain medication so the patient could more comfortably receive radiation. He was just discharged from the hospital on 01/08/23.  Upon assessment with nursing, the patient is hypotensive with BP 94/73, P 123, O2 of 81% on room air, and clinically is in acute distress, grimacing in pain, writhing in pain in a wheelchair, and tachypeic. O2 was placed with non rebreather mask since O2 did not come up with [place]. With 2 L O2 and deep breathing the patient's O2 came up into the high 90%tile.    I reached out to his inpt team from two days ago as well as the palliative team who is aware of him. The admitting MD for [facility] is now also aware and recommendations were to take the patient to the ED for further assessment and consideration of direct admission from the ED.      Osker Mason, PAC

## 2023-01-11 NOTE — Consult Note (Signed)
Palliative Care Consult Note                                  Date: 01/11/2023   Patient Name: Gavin Dennis  DOB: 1950-06-08  MRN: [id number]  Age / Sex: 72 y.o., male  PCP: Ollen Bowl, MD Referring Physician: [name], MD  Reason for Consultation: Establishing goals of care  HPI/Patient Profile: 72 y.o. male  with past medical history of metastatic non-small cell lung cancer (diagnosed February 2024 and s/p concurrent chemo radiation with partial response), CVA in 2022, and type 2 diabetes.   Patient was diagnosed with lung cancer in February 2024. He underwent concurrent chemoradiation with partial response. He was recently found to have disease progression with mets to liver/ribs/spine as well as T-10 compression fracture.  He was recently hospitalized 12/27/2022 through 01/08/2023 with pulmonary embolism.  He also started palliative radiation to the T-spine.  He was admitted to [facility] from radiation oncology clinic on 01/31/23 with uncontrolled pain.    Past Medical History:  Diagnosis Date   Blurred vision, left eye    since stroke 06/2020   CVA (cerebral vascular accident) (HCC) 06/2020   DDD (degenerative disc disease), lumbar    Diabetes mellitus without complication (HCC)    History of radiation therapy    Right lung- 07/17/22-08/28/22- Dr. Antony Blackbird   Hyperlipidemia    Hypertension    Hypomagnesemia    Polio    childhood   Vitamin B 12 deficiency     Subjective:   I have reviewed medical records including EPIC notes, labs and imaging, and assessed the patient at bedside.   I met with patient and daughter-in-law Laroy Apple at bedside to discuss diagnosis, prognosis, GOC, disposition, and options.  Patient is well-known to me from his recent admission.  I re-introduced Palliative Medicine as specialized medical care for people living with serious illness. It focuses on providing relief from the symptoms and stress of  a serious illness.   We discussed patient's current illness and what it means in the larger context of his ongoing co-morbidities. Current clinical status was reviewed.   Created space and opportunity for patient and family to express thoughts and feelings regarding current medical situation. Values and goals of care important to patient and family were attempted to be elicited.   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  He has 2 sons, Floydene Flock and Bronson.  He also has 2 stepdaughters.    Functional Status: Son and daughter-in-law live with patient at his house and help care for him.  Patient's functional status has declined due to back pain.  His mobility has been limited and he is needed more help with basic ADLs  GOC Discussion: I initially attempted to see patient around 15:30, but he was about to be transported to radiology for CT of his head. He denied pain at this time. Per review of MAR, patient had received minimal prn pain medication in the past 24 hours (10 mg oxycodone IR and 0.5 mg Dilaudid IV).  I returned to the room later (17:15), and his daughter-in-law was present. Patient now reporting severe pain, and RN has just given oxycodone IR.   I reviewed with daughter-in-law that patient has metastatic lung cancer, which is a noncurable disease.  We reviewed that the intent of palliative radiation to his spine is to hopefully reduce his back pain.  Daughter-in-law  expresses that it has been a struggle to care for patient at home as his condition has declined.  She has been his primary caregiver, and reports not having any help from other family.  She is in the process of applying for PACE; he was scheduled for an in-home assessment the day he was re-admitted to the hospital.   PT is currently recommending SNF/rehab. Discussed that the goal of rehab is improvement of functional status, which can be challenging in the setting of advanced disease.  We did  discuss hospice. Per Dr. Rebecca Eaton note on 12/29/2022, "patient is interested in additional treatment" and "has no intention to consider hospice at this point".  Today, I discussed with daughter-in-law that patient would be eligible for hospice when he is no longer a candidate for further cancer treatment.    We also discussed code status.  During his recent hospitalization, patient was clear in his desire for DNR/DNI status (see PMT notes 12/30/22 and 01/04/23). Today, we reviewed that prognosis would be poor in the event of cardiac arrest, as the cause of the arrest would likely be associated with terminal disease rather than a reversible cardiopulmonary event.  Reviewed that DNR/DNI is a protective measure to prevent trauma to a person during her last moments of life.  Patient again agrees that DNR/DNI status is appropriate, and his daughter-in-law is supportive of this decision.   Review of Systems  Musculoskeletal:  Positive for back pain.    Objective:   Primary Diagnoses: Present on Admission:  Uncontrolled pain   Physical Exam Vitals reviewed.  Constitutional:      General: He is not in acute distress.    Appearance: He is ill-appearing.     Comments: Frail  Pulmonary:     Effort: Pulmonary effort is normal.  Neurological:     Mental Status: He is alert and oriented to person, place, and time.     Vital Signs:  BP 121/86 (BP Location: Left Arm)   Pulse (!) 53   Temp 98.2 F (36.8 C)   Resp 17   Ht 5\' 11"  (1.803 m)   Wt 63.5 kg   SpO2 97%   BMI 19.52 kg/m   Palliative Assessment/Data: PPS 40%     Assessment & Plan:   SUMMARY OF RECOMMENDATIONS   Code status changed to DNR with limited interventions Continue current supportive care Pain seems to be well-controlled on current regimen; patient has required minimal PRNs in the last 24 hours PMT will continue to follow Patient is currently enrolled in outpatient palliative with [facility]   Primary Decision  Maker: PATIENT  Symptom Management:  oxycodone IR 10 mg every 4 hours as needed for pain Dilaudid 0.5 mg IV every 3 hours as needed for pain  Prognosis:  < 6 months would not be surprising  Discharge Planning:  To Be Determined    Thank you for allowing the Palliative Medicine Team to assist in the care of this patient.   Greater than 50%  of this time was spent counseling and coordinating care related to the above assessment and plan.  Total time: 90 minutes   Merry Proud, NP Palliative Medicine   Please contact Palliative Medicine Team phone at [phone] for questions and concerns.  For individual provider, see AMION.

## 2023-01-11 NOTE — TOC Initial Note (Signed)
Transition of Care [facility]) - Initial/Assessment Note    Patient Details  Name: Gavin Dennis MRN: [id number] Date of Birth: [birth year]  [facility]) CM/SW Contact:    Harriett Sine, RN Phone Number: 01/11/2023, 3:31 PM  Clinical Narrative:                 Spoke with pt at bedside. Pt stated he was to weak to feed himself. Pt on O2 1L. NCM presented MOON form with explanation and pt asked that the form be printed without his signature because he was too weak today. TOC needs id and following.   Expected Discharge Plan: Home w Home Health Services Barriers to Discharge: No Barriers Identified   Patient Goals and CMS Choice Patient states their goals for this hospitalization and ongoing recovery are:: none stated CMS Medicare.gov Compare Post Acute Care list provided to:: Patient        Expected Discharge Plan and Services       Living arrangements for the past 2 months: Single Family Home                                      Prior Living Arrangements/Services Living arrangements for the past 2 months: Single Family Home Lives with:: Self Patient language and need for interpreter reviewed:: Yes Do you feel safe going back to the place where you live?: Yes      Need for Family Participation in Patient Care: Yes (Comment) Care giver support system in place?: Yes (comment) (daughter Laroy Apple)   Criminal Activity/Legal Involvement Pertinent to Current Situation/Hospitalization: No - Comment as needed  Activities of Daily Living      Permission Sought/Granted Permission sought to share information with :  (daughter/family) Permission granted to share information with : Yes, Verbal Permission Granted              Emotional Assessment Appearance:: Appears older than stated age Attitude/Demeanor/Rapport: Guarded Affect (typically observed): Accepting, Calm Orientation: : Oriented to Self, Oriented to Place, Oriented to  Time, Oriented to  Situation Alcohol / Substance Use:  (quit smoking) Psych Involvement: No (comment)  Admission diagnosis:  Shortness of breath [R06.02] Pleural effusion, right [J90] Uncontrolled pain [R52] Patient Active Problem List   Diagnosis Date Noted   Uncontrolled pain 01/22/23   Tachycardia 01/06/2023   Hypercalcemia 12/30/2022   Anorexia 12/29/2022   Diarrhea 12/27/2022   AKI (acute kidney injury) (HCC) 12/27/2022   Pulmonary embolism (HCC) 12/27/2022   Pathologic thoracic fracture 12/09/2022   Metastatic cancer to liver (HCC) 12/09/2022   Neutropenia (HCC) 07/24/2022   Malignant neoplasm of unspecified part of unspecified bronchus or lung (HCC) 06/26/2022   Mediastinal lymphadenopathy 06/12/2022   Stroke (cerebrum) (HCC) 03/13/2022   Aneurysm of ascending aorta without rupture (HCC) 08/26/2021   Atherosclerosis of coronary artery without angina pectoris 08/26/2021   Cervical disc disorder 08/26/2021   Essential hypertension 08/26/2021   Hyperlipidemia 08/26/2021   Hardening of the aorta (main artery of the heart) (HCC) 08/26/2021   Hypomagnesemia 08/26/2021   Other intervertebral disc degeneration, lumbar region 08/26/2021   Pulmonary emphysema (HCC) 08/26/2021   Type 2 diabetes mellitus with other specified complication (HCC) 08/26/2021   History of stroke 07/10/2020   Visual disturbance    CVA (cerebral vascular accident) (HCC) 07/09/2020   PCP:  Ollen Bowl, MD Pharmacy:   [id number] - [place] ([place] - [address] [place] ([place] [address] Phone: [phone] Fax: [phone]     Social Determinants of Health (SDOH) Social History: SDOH Screenings   Food Insecurity: No Food Insecurity (12/27/2022)  Housing: Low Risk  (12/30/2022)  Recent Concern: Housing - Medium Risk (12/27/2022)  Transportation Needs: No Transportation Needs (12/27/2022)  Utilities: Not At Risk (12/27/2022)  Alcohol Screen: Low Risk   (07/29/2021)  Depression (PHQ2-9): Low Risk  (07/05/2022)  Financial Resource Strain: Low Risk  (12/30/2022)  Physical Activity: Insufficiently Active (07/29/2021)  Social Connections: Moderately Integrated (07/29/2021)  Stress: No Stress Concern Present (07/29/2021)  Tobacco Use: Medium Risk (12/27/2022)   SDOH Interventions:     Readmission Risk Interventions     No data to display

## 2023-01-11 NOTE — Care Management Obs Status (Signed)
MEDICARE OBSERVATION STATUS NOTIFICATION   Patient Details  Name: Gavin Dennis MRN: [id number] Date of Birth: Jun 07, 1950   Medicare Observation Status Notification Given:  Yes    Harriett Sine, RN 01/11/2023, 3:21 PM

## 2023-01-11 NOTE — Progress Notes (Signed)
[facility] PROGRESS NOTE  Gavin Dennis (DOB: [birth year]) [id number] PCP: Ollen Bowl, MD  Brief Narrative:  72 year old male with history of lung cancer with mets to liver/ribs/scapula/vertebrae, was offered palliative/hospice services but patient declined and wanted to pursue further treatment, neutropenia history of CVA, hypertension, history of PE, T2DM, ascending aortic aneurysm, emphysema recent hospitalization 8/28 - 9/9 for tachycardia/SVT/acute PE, diarrhea sent from radiation oncology on 822 mid to low back pain, shortness of breath. Currently he denies any pain complains of being cold mild cough.  Denies any fever chills dysuria abdominal pain nausea vomiting.   In the UV:OZDGUY tachycardia in 120s, saturating 88% on percent needing 2 L nasal cannula, BP stable in 90s to 120s Labs: Blood gas pH 7.34, pCO2 43 pO2 less than 31, BMP with stable renal function BUN 30 calcium 10.5 lactic acid 4.1, hemoglobin 15 normal WBC count blood sugar in 2012 influenza COVID screening negative. Imaging: CT angio no PE resolution of previous PE in the left upper lobe, small to moderate effusion on the right, posttreatment markings on the right lung, hilar and mediastinal adenopathy, liver metastasis, lytic lesion on left scapula and left heart rate, right fifth rib, inferior right scapula, T9, T10, T12, aortic atherosclerosis. Blood culture sent, given IV fluid bolus, DuoNeb and troponin ordered and admission was requested for further management   9/12: Reports he lost vision in his left eye and having trouble feeding himself. H  Subjective: Actually denies any concerns at all. The pain in his back is improved with the IV dilaudid we gave this morning. He maintains an appetite but looked to be struggling with lunch. He did not volunteer any other information but actually says he lost vision in his left eye yesterday. Didn't tell anyone. Does admit to some chronic poor vision as well. No focal  numbness or weakness.   Objective: BP 121/86 (BP Location: Left Arm)   Pulse (!) 53   Temp 98.2 F (36.8 C)   Resp 17   Ht 5\' 11"  (1.803 m)   Wt 63.5 kg   SpO2 97%   BMI 19.52 kg/m   Gen: Chronically ill-appearing Pulm: Clear, nonlabored, no wheezes on room air  CV: Regular borderline bradycardia. No MRG or pitting edema GI: Soft, NT, ND, +BS Neuro: Alert and cooperative with sparse verbal output. Moves all extremities but very limited visual fields (difficult to tell how much is exam-dependent). He does not have an APD. Eyes have arcus senilis and pretty dense cataracts bilaterally.  Ext: Warm, no deformities. Skin: No acute rashes, lesions or ulcers on visualized skin   Assessment & Plan: Dyspnea/acute respiratory insufficiency and mild respiratory distress Small to moderate pleural effusion on right Lung cancer with mets to liver/ribs/scapula/vertebrae: Cancer-related pain: Patient presentation suspect due to his lung cancer with mets.  No obvious pneumonia, no PE has mild effusion.he is currently on palliative radiation, recently he was offered palliative/hospice services but patient declined and wanted to pursue further treatment.  Will start on pain management oral IV opiates, scheduled Tylenol, consult palliative care, will need to notify his rad/oncology in the morning.  No indication for thoracentesis, repeat chest x-ray in the morning.  Continue bronchodilators as needed, supplemental oxygen.  Overall poor prognosis - Continue decadron.  - Continue prn IV dilaudid   Decreased vision: Unclear timeline of events, though does have hx stroke. Cataracts definitely contributing but not acutely.  - Check CT head as first step in work up.   SIRS: w/ tachycarda with  left shift  Lactic acidosis: Elevated lactic of 4.1, cleared with IVF. HD stable with no source of infection. Metformin likely contributed in addition to liver-metastasis-associated diminished clearance.    Thrombocytopenia:  - Monitor. Hold DOAC if goes < 50k or if bleeding  Hypercalcemia of malignancy: Improving. Corrected calcium 10.5 this AM.  - Continue IVF.     Recent PE: Not seen on repeat CTA chest this admission. Difficult situation since he did have acute PE and continues to have malignancy-associated hypercoagulability. Will need to discuss goals of care further with pt/family to decide on outpatient anticoagulation.    History of stroke: Recently Plavix was stopped as he was placed on anticoagulation.   - Continue statin.   Hypertension: - Continue metoprolol.    T2DM: HbA1c 6.7%.  - Hold metformin (possible contributor/cause of lactic acid elevation).  - Continue SSI.    Ascending aortic aneurysm Aortic atherosclerosis: - Semiannual imaging of 4.6cm thoracic aneurysm and q2 years imaging of 3.6cm infrarenal aneurysm.    GOC: Poor prognosis, former DNR.  - Currently full code.  - Appreciate palliative care consultation.    [name], MD [facility] [email or web address] 01/11/2023, 3:36 PM

## 2023-01-12 ENCOUNTER — Ambulatory Visit
Admit: 2023-01-12 | Discharge: 2023-01-12 | Disposition: A | Discharge status: Home / Self Care | Payer: Medicare HMO | Attending: Radiation Oncology | Admitting: Radiation Oncology

## 2023-01-12 ENCOUNTER — Telehealth: Payer: Self-pay | Admitting: Physician Assistant

## 2023-01-12 ENCOUNTER — Ambulatory Visit
Admission: RE | Admit: 2023-01-12 | Discharge: 2023-01-12 | Disposition: A | Discharge status: Home / Self Care | Payer: Medicare HMO | Source: Ambulatory Visit | Attending: Radiation Oncology

## 2023-01-12 ENCOUNTER — Other Ambulatory Visit: Payer: Self-pay

## 2023-01-12 DIAGNOSIS — R52 Pain, unspecified: Secondary | ICD-10-CM | POA: Diagnosis not present

## 2023-01-12 LAB — CBC
HCT: 36.2 % — ABNORMAL LOW (ref 39.0–52.0)
Hemoglobin: 12.1 g/dL — ABNORMAL LOW (ref 13.0–17.0)
MCH: 33.3 pg (ref 26.0–34.0)
MCHC: 33.4 g/dL (ref 30.0–36.0)
MCV: 99.7 fL (ref 80.0–100.0)
Platelets: 63 10*3/uL — ABNORMAL LOW (ref 150–400)
RBC: 3.63 MIL/uL — ABNORMAL LOW (ref 4.22–5.81)
RDW: 15.6 % — ABNORMAL HIGH (ref 11.5–15.5)
WBC: 8.9 10*3/uL (ref 4.0–10.5)
nRBC: 0 % (ref 0.0–0.2)

## 2023-01-12 LAB — COMPREHENSIVE METABOLIC PANEL
ALT: 33 U/L (ref 0–44)
AST: 16 U/L (ref 15–41)
Albumin: 2.3 g/dL — ABNORMAL LOW (ref 3.5–5.0)
Alkaline Phosphatase: 105 U/L (ref 38–126)
Anion gap: 7 (ref 5–15)
BUN: 19 mg/dL (ref 8–23)
CO2: 19 mmol/L — ABNORMAL LOW (ref 22–32)
Calcium: 8.9 mg/dL (ref 8.9–10.3)
Chloride: 109 mmol/L (ref 98–111)
Creatinine, Ser: 0.62 mg/dL (ref 0.61–1.24)
GFR, Estimated: >60 mL/min (ref >60)
Glucose, Bld: 81 mg/dL (ref 70–99)
Potassium: 3.4 mmol/L — ABNORMAL LOW (ref 3.5–5.1)
Sodium: 135 mmol/L (ref 135–145)
Total Bilirubin: 1.8 mg/dL — ABNORMAL HIGH (ref 0.3–1.2)
Total Protein: 5.1 g/dL — ABNORMAL LOW (ref 6.5–8.1)

## 2023-01-12 LAB — RAD ONC ARIA SESSION SUMMARY
Course Elapsed Days: 9
Plan Fractions Treated to Date: 6
Plan Prescribed Dose Per Fraction: 3 Gy
Plan Total Fractions Prescribed: 10
Plan Total Prescribed Dose: 30 Gy
Reference Point Dosage Given to Date: 18 Gy
Reference Point Session Dosage Given: 3 Gy
Session Number: 6

## 2023-01-12 LAB — GLUCOSE, CAPILLARY
Glucose-Capillary: 102 mg/dL — ABNORMAL HIGH (ref 70–99)
Glucose-Capillary: 140 mg/dL — ABNORMAL HIGH (ref 70–99)
Glucose-Capillary: 141 mg/dL — ABNORMAL HIGH (ref 70–99)
Glucose-Capillary: 182 mg/dL — ABNORMAL HIGH (ref 70–99)
Glucose-Capillary: 82 mg/dL (ref 70–99)
Glucose-Capillary: 83 mg/dL (ref 70–99)

## 2023-01-12 MED ORDER — LACTATED RINGERS IV SOLN: INTRAVENOUS | Status: DC

## 2023-01-12 NOTE — Progress Notes (Signed)
[facility] PROGRESS NOTE  Gavin Dennis (DOB: 21-May-1950) ZOX:[id number] PCP: Ollen Bowl, MD  Brief Narrative: Gavin Dennis is a 72 year old male with history of lung cancer with mets to liver/ribs/scapula/vertebrae, was offered palliative/hospice services but patient declined and wanted to pursue further treatment, neutropenia history of CVA, hypertension, history of PE, T2DM, ascending aortic aneurysm, emphysema recent hospitalization 8/28 - 9/9 for tachycardia/SVT/acute PE, diarrhea sent from radiation oncology on 822 mid to low back pain, shortness of breath. Currently he denies any pain complains of being cold mild cough.  Denies any fever chills dysuria abdominal pain nausea vomiting.   In the WJ:XBJYNW tachycardia in 120s, saturating 88% on percent needing 2 L nasal cannula, BP stable in 90s to 120s Labs: Blood gas pH 7.34, pCO2 43 pO2 less than 31, BMP with stable renal function BUN 30 calcium 10.5 lactic acid 4.1, hemoglobin 15 normal WBC count blood sugar in 2012 influenza COVID screening negative. Imaging: CT angio no PE resolution of previous PE in the left upper lobe, small to moderate effusion on the right, posttreatment markings on the right lung, hilar and mediastinal adenopathy, liver metastasis, lytic lesion on left scapula and left heart rate, right fifth rib, inferior right scapula, T9, T10, T12, aortic atherosclerosis. Blood culture sent, given IV fluid bolus, DuoNeb and troponin ordered and admission was requested for further management   9/12: Reports he lost vision in his left eye and having trouble feeding himself. H  Subjective: Feeling a bit stronger, says he's taking it one day at a time with his cancer diagnosis. [birth year]. Vision a bit better he says, no double vision or blackout, just blurry.  Objective: BP (!) 126/92 (BP Location: Left Arm)   Pulse 87   Temp 98.7 F (37.1 C) (Oral)   Resp 16   Ht 5\' 11"  (1.803 m)   Wt 63.5 kg   SpO2 95%    BMI 19.52 kg/m   Gen: No distress, frail.  Pulm: Clear, nonlabored  CV: RRR, no MRG GI: Soft, NT, ND, +BS  Neuro: Alert and oriented, diffusely weak. No new focal deficits. Ext: Warm, no deformities Skin: No new rashes, lesions or ulcers on visualized skin   Assessment & Plan: Dyspnea/acute respiratory insufficiency and mild respiratory distress Small to moderate pleural effusion on right Lung cancer with mets to liver/ribs/scapula/vertebrae: Cancer-related pain: Patient presentation suspect due to his lung cancer with mets.  No obvious pneumonia, no PE has mild effusion.he is currently on palliative radiation, recently he was offered palliative/hospice services but patient declined and wanted to pursue further treatment.  Will start on pain management oral IV opiates, scheduled Tylenol, consult palliative care, will need to notify his rad/oncology in the morning.  No indication for thoracentesis, repeat chest x-ray in the morning.  Continue bronchodilators as needed, supplemental oxygen.  Overall poor prognosis - Continue decadron.  - Continue prn IV dilaudid, reasonably controlled, appreciate palliative care involvement.    Decreased vision: CT head negative, by history it sounds refractive.  - Follow up with ophthalmology.   SIRS: w/ tachycarda with left shift. No actual leukocytosis, no fever. Not covering with antibiotics at this time.   Lactic acidosis: Elevated lactic of 4.1, cleared with IVF. HD stable with no source of infection. Metformin likely contributed in addition to liver-metastasis-associated diminished clearance. - Modify to LR with acidosis. Monitor intake, consider DC'ing IVF.    Thrombocytopenia:  - Monitor. Hold DOAC if goes < 50k or if bleeding  Hypercalcemia of malignancy:  Improving. Corrected calcium 10.5 this AM.  - Continue IVF.     Recent PE: Not seen on repeat CTA chest this admission. Difficult situation since he did have acute PE and continues to have  malignancy-associated hypercoagulability. Will need to discuss goals of care further with pt/family to decide on outpatient anticoagulation.    History of stroke: Recently Plavix was stopped as he was placed on anticoagulation.   - Continue statin.   Hypertension: - Continue metoprolol.    T2DM: HbA1c 6.7%.  - Hold metformin (possible contributor/cause of lactic acid elevation).  - Continue SSI.    Ascending aortic aneurysm Aortic atherosclerosis: - Semiannual imaging of 4.6cm thoracic aneurysm and q2 years imaging of 3.6cm infrarenal aneurysm.    GOC: Poor prognosis, remains DNR - Unclear whether he is a candidate for lung CA Tx with worsening debility.   Debility:  - PT/OT consulted, suspect he would require SNF rehabilitation.   [name], MD [facility] [email or web address] 01/12/2023, 3:56 PM

## 2023-01-12 NOTE — Progress Notes (Signed)
Chaplain received a consult to provide assistance with getting Gavin Dennis's advance directives notarized.  Chaplain attempted to see him, but he was off the unit at the time of visit.  [address] [facility]cc Pager, [phone]

## 2023-01-12 NOTE — Progress Notes (Signed)
OT Cancellation Note  Patient Details Name: Gavin Dennis MRN: [id number] DOB: 1950-07-10   Cancelled Treatment:    Reason Eval/Treat Not Completed: Patient at procedure or test/ unavailable  Evern Bio 01/12/2023, 4:35 PM Berna Spare, OTR/L Acute Rehabilitation Services Office: [phone]

## 2023-01-12 NOTE — Evaluation (Addendum)
Physical Therapy Evaluation Patient Details Name: Gavin Dennis MRN: [id number] DOB: June 06, 1950 Today's Date: 01/12/2023  History of Present Illness  72 yo male presents to therapy following hospital admission on 01/23/2023 due to uncontrolled pain. Pt recently hospitalized 8/28-9/9 secondary to tachycardia (SVT), acute PE and diarrhea and on 8/22 due to LBP and SOB. Pt found to have mild respiratory distress, small plural effusion, lactic acidosis, and pain is attributed to metastatic lung cancer. Pt PMH includes but is not limited to: SIRS, hypercalcemia, PE, CVA (2022 and 03/2022), HTN, DM II, AAA and T10 and T12 compression fx.  Clinical Impression     Pt admitted with above diagnosis.  Pt currently with functional limitations due to the deficits listed below (see PT Problem List). Pt in bed when PT arrived. Pt premedicated and limited reports of back pain. Pt scheduled for Radiation this afternoon. Pt required mod A for supine to sit, mod A for pull to stand from elevated EOB to RW, min A to maintain balance, mod A for gait assessment with RW and max A x 2 for sit to supine. Pt demonstrated difficulty with UE placement on RW and locating hand holds as well as increased time and physical assist to release grasp on RW and bed rail. Pt left in bed all needs in place and nursing staff present. Pt will benefit from acute skilled PT to increase their independence and safety with mobility to allow discharge.         If plan is discharge home, recommend the following: A lot of help with walking and/or transfers;A lot of help with bathing/dressing/bathroom;Assistance with cooking/housework;Assist for transportation;Help with stairs or ramp for entrance   Can travel by private vehicle        Equipment Recommendations Wheelchair (measurements PT);Wheelchair cushion (measurements PT)  Recommendations for Other Services       Functional Status Assessment Patient has had a recent decline in their  functional status and demonstrates the ability to make significant improvements in function in a reasonable and predictable amount of time.     Precautions / Restrictions Precautions Precautions: Fall Restrictions Weight Bearing Restrictions: No      Mobility  Bed Mobility Overal bed mobility: Needs Assistance Bed Mobility: Sit to Supine, Supine to Sit     Supine to sit: Mod assist, HOB elevated, Used rails Sit to supine: Used rails, HOB elevated, Max assist, +2 for safety/equipment, +2 for physical assistance   General bed mobility comments: increased time cues and A for B LE and trunk for supine <> sit, max  A to reposition in bed    Transfers Overall transfer level: Needs assistance Equipment used: Rolling walker (2 wheels) Transfers: Sit to/from Stand Sit to Stand: Mod assist, From elevated surface           General transfer comment: pt required mod A to power up with pull to stand at RW, once in standing min A to maintain balance at RW with cues for extension posture    Ambulation/Gait Ambulation/Gait assistance: Mod assist Gait Distance (Feet): 2 Feet Assistive device: Rolling walker (2 wheels) Gait Pattern/deviations: Step-to pattern, Shuffle, Trunk flexed       General Gait Details: limited ability to weight shift to advace LEs pt unable to amb to place recliner behind pt nor perform retrograde stepping pattern to return to EOB with nursing staff assisting PT by moving bed behind pt, pt able to side step to the L with mod A and RW, B LE weakness and  instabiltiy noted with trunk hip and knee flexion.  Stairs            Wheelchair Mobility     Tilt Bed    Modified Rankin (Stroke Patients Only)       Balance Overall balance assessment: Needs assistance Sitting-balance support: Feet supported, Bilateral upper extremity supported Sitting balance-Leahy Scale: Fair Sitting balance - Comments: seated EOB pt grasping RW infront of pt and exhibiting R  lateral lean and trunk flexion   Standing balance support: Bilateral upper extremity supported, During functional activity, Reliant on assistive device for balance Standing balance-Leahy Scale: Poor Standing balance comment: pt denies falls in past 6 months                             Pertinent Vitals/Pain Pain Assessment Pain Assessment: Faces Faces Pain Scale: Hurts a little bit Pain Location: back Pain Descriptors / Indicators: Aching, Constant, Discomfort, Guarding Pain Intervention(s): Limited activity within patient's tolerance, Monitored during session, Premedicated before session, Repositioned    Home Living Family/patient expects to be discharged to:: Private residence Living Arrangements: Children;Other (Comment) (daughter in law) Available Help at Discharge: Family;Available 24 hours/day Type of Home: House Home Access: Stairs to enter Entrance Stairs-Rails: Right;Left;Can reach both Entrance Stairs-Number of Steps: 3   Home Layout: One level Home Equipment: Agricultural consultant (2 wheels) Additional Comments: has fall alert button/necklace    Prior Function Prior Level of Function : Needs assist             Mobility Comments: pt indicated non ambulatory since he "broke his back" ambulation NT at last hospitalization, son assist for steps ADLs Comments: daughter in law assists with bathing/dressing, pt voids in the bed     Extremity/Trunk Assessment        Lower Extremity Assessment Lower Extremity Assessment: Generalized weakness    Cervical / Trunk Assessment Cervical / Trunk Assessment: Normal  Communication   Communication Communication: No apparent difficulties  Cognition Arousal: Alert Behavior During Therapy: WFL for tasks assessed/performed Overall Cognitive Status: Within Functional Limits for tasks assessed                                          General Comments      Exercises     Assessment/Plan    PT  Assessment Patient needs continued PT services  PT Problem List Decreased strength;Decreased activity tolerance;Decreased balance;Decreased mobility;Decreased coordination;Pain       PT Treatment Interventions DME instruction;Gait training;Functional mobility training;Therapeutic activities;Therapeutic exercise;Balance training;Patient/family education;Stair training    PT Goals (Current goals can be found in the Care Plan section)  Acute Rehab PT Goals Patient Stated Goal: to get stronger and go home PT Goal Formulation: With patient Time For Goal Achievement: 01/26/23 Potential to Achieve Goals: Fair    Frequency Min 1X/week     Co-evaluation               AM-PAC PT "6 Clicks" Mobility  Outcome Measure Help needed turning from your back to your side while in a flat bed without using bedrails?: A Lot Help needed moving from lying on your back to sitting on the side of a flat bed without using bedrails?: A Lot Help needed moving to and from a bed to a chair (including a wheelchair)?: A Lot Help needed standing up from a chair  using your arms (e.g., wheelchair or bedside chair)?: A Lot Help needed to walk in hospital room?: Total Help needed climbing 3-5 steps with a railing? : Total 6 Click Score: 10    End of Session Equipment Utilized During Treatment: Gait belt Activity Tolerance: Patient limited by pain;Patient limited by fatigue Patient left: in bed;with bed alarm set;with nursing/sitter in room;with call bell/phone within reach Nurse Communication: Mobility status PT Visit Diagnosis: Pain;Unsteadiness on feet (R26.81);Muscle weakness (generalized) (M62.81);Difficulty in walking, not elsewhere classified (R26.2) Pain - part of body:  (back)    Time: 2130-8657 PT Time Calculation (min) (ACUTE ONLY): 24 min   Charges:   PT Evaluation $PT Eval Moderate Complexity: 1 Mod PT Treatments $Therapeutic Activity: 8-22 mins PT General Charges $$ ACUTE PT VISIT: 1  Visit         Johnny Bridge, PT Acute Rehab   Jacqualyn Posey 01/12/2023, 6:03 PM

## 2023-01-12 NOTE — Telephone Encounter (Signed)
Patient's daughter is ware of scheduled appointment times/dates

## 2023-01-13 DIAGNOSIS — G893 Neoplasm related pain (acute) (chronic): Secondary | ICD-10-CM | POA: Diagnosis not present

## 2023-01-13 DIAGNOSIS — R52 Pain, unspecified: Secondary | ICD-10-CM | POA: Diagnosis not present

## 2023-01-13 DIAGNOSIS — C3490 Malignant neoplasm of unspecified part of unspecified bronchus or lung: Secondary | ICD-10-CM | POA: Diagnosis not present

## 2023-01-13 DIAGNOSIS — Z7189 Other specified counseling: Secondary | ICD-10-CM | POA: Diagnosis not present

## 2023-01-13 DIAGNOSIS — Z515 Encounter for palliative care: Secondary | ICD-10-CM | POA: Diagnosis not present

## 2023-01-13 LAB — CBC
HCT: 34.7 % — ABNORMAL LOW (ref 39.0–52.0)
Hemoglobin: 12 g/dL — ABNORMAL LOW (ref 13.0–17.0)
MCH: 33.4 pg (ref 26.0–34.0)
MCHC: 34.6 g/dL (ref 30.0–36.0)
MCV: 96.7 fL (ref 80.0–100.0)
Platelets: 56 10*3/uL — ABNORMAL LOW (ref 150–400)
RBC: 3.59 MIL/uL — ABNORMAL LOW (ref 4.22–5.81)
RDW: 15.4 % (ref 11.5–15.5)
WBC: 7.2 10*3/uL (ref 4.0–10.5)
nRBC: 0 % (ref 0.0–0.2)

## 2023-01-13 LAB — GLUCOSE, CAPILLARY
Glucose-Capillary: 99 mg/dL (ref 70–99)
Glucose-Capillary: 165 mg/dL — ABNORMAL HIGH (ref 70–99)
Glucose-Capillary: 135 mg/dL — ABNORMAL HIGH (ref 70–99)
Glucose-Capillary: 172 mg/dL — ABNORMAL HIGH (ref 70–99)

## 2023-01-13 MED ORDER — OXYCODONE HCL ER 10 MG PO T12A
10.0000 mg | EXTENDED_RELEASE_TABLET | Freq: Two times a day (BID) | ORAL | Status: DC
  Administered 2023-01-13 – 2023-01-14 (×2): 10 mg via ORAL
  Filled 2023-01-13 (×2): qty 1

## 2023-01-13 NOTE — Progress Notes (Signed)
Palliative Medicine Progress Note   Patient Name: Gavin Dennis       Date: 01/13/2023 DOB: [birth year]  Age: 72 y.o. MRN#: [id number] Attending Physician: [name], MD Primary Care Physician: Ollen Bowl, MD Admit Date: 01/28/2023  Reason for Consultation/Follow-up: {Reason for Consult:23484}  HPI/Patient Profile:  72 y.o. male  with past medical history of metastatic non-small cell lung cancer (diagnosed February 2024 and s/p concurrent chemo radiation with partial response), CVA in 2022, and type 2 diabetes.    Patient was diagnosed with lung cancer in February 2024. He underwent concurrent chemoradiation with partial response. He was recently found to have disease progression with mets to liver/ribs/spine as well as T-10 compression fracture.   He was recently hospitalized 12/27/2022 through 01/08/2023 with pulmonary embolism.  He also started palliative radiation to the T-spine.   He was admitted to [facility] from radiation oncology clinic on 01/03/2023 with uncontrolled pain.   Subjective: ***  Objective:  Physical Exam          Vital Signs: BP (!) 124/98 (BP Location: Left Arm)   Pulse 75   Temp 97.9 F (36.6 C) (Oral)   Resp 18   Ht 5\' 11"  (1.803 m)   Wt 63.5 kg   SpO2 100%   BMI 19.52 kg/m  SpO2: SpO2: 100 % O2 Device: O2 Device: Room Air O2 Flow Rate: O2 Flow Rate (L/min): 1 L/min  Intake/output summary:  Intake/Output Summary (Last 24 hours) at 01/13/2023 1723 Last data filed at 01/13/2023 1648 Gross per 24 hour  Intake 926.23 ml  Output 1000 ml  Net -73.77 ml    LBM: Last BM Date : 01/08/23     Palliative Assessment/Data: ***     Palliative Medicine Assessment & Plan   Assessment: Principal Problem:   Uncontrolled pain     Recommendations/Plan: DNR/DNI Agree with Oxycontin 10 mg every 12 hours  Goals of Care and Additional Recommendations: Limitations on Scope of Treatment: {Recommended Scope and Preferences:21019}  Code Status:   Prognosis:  {Palliative Care Prognosis:23504}  Discharge Planning: {Palliative dispostion:23505}  Care plan was discussed with ***  Thank you for allowing the Palliative Medicine Team to assist in the care of this patient.   ***   Merry Proud, NP   Please contact Palliative Medicine Team phone at [phone] for  questions and concerns.  For individual providers, please see AMION.

## 2023-01-13 NOTE — Progress Notes (Signed)
[facility] PROGRESS NOTE  Gavin Dennis (DOB: [birth year]) JYN:[id number] PCP: Ollen Bowl, MD  Brief Narrative: Gavin Dennis is a 72 year old male with history of lung cancer with mets to liver/ribs/scapula/vertebrae, was offered palliative/hospice services but patient declined and wanted to pursue further treatment, neutropenia history of CVA, hypertension, history of PE, T2DM, ascending aortic aneurysm, emphysema recent hospitalization 8/28 - 9/9 for tachycardia/SVT/acute PE, diarrhea sent from radiation oncology on 822 mid to low back pain, shortness of breath. Currently he denies any pain complains of being cold mild cough.  Denies any fever chills dysuria abdominal pain nausea vomiting.   In the QM:VHQION tachycardia in 120s, saturating 88% on percent needing 2 L nasal cannula, BP stable in 90s to 120s Labs: Blood gas pH 7.34, pCO2 43 pO2 less than 31, BMP with stable renal function BUN 30 calcium 10.5 lactic acid 4.1, hemoglobin 15 normal WBC count blood sugar in 2012 influenza COVID screening negative. Imaging: CT angio no PE resolution of previous PE in the left upper lobe, small to moderate effusion on the right, posttreatment markings on the right lung, hilar and mediastinal adenopathy, liver metastasis, lytic lesion on left scapula and left heart rate, right fifth rib, inferior right scapula, T9, T10, T12, aortic atherosclerosis. Blood culture sent, given IV fluid bolus, DuoNeb and troponin ordered and admission was requested for further management   Subjective: Recognizes his significant weakness, when asked if he would be open to rehab in a facility, states "that would be reasonable." Thanks me for birthday wishes yesterday. Was writhing in pain this AM per RN, no meds given overnight. Feels ok right now.   Objective: BP (!) 124/98 (BP Location: Left Arm)   Pulse 75   Temp 97.9 F (36.6 C) (Oral)   Resp 18   Ht 5\' 11"  (1.803 m)   Wt 63.5 kg   SpO2 100%   BMI 19.52  kg/m   Gen: Frail, chronically ill-appearing male in no distress Pulm: Clear, nonlabored  CV: RRR, no MRG GI: Soft, NT, ND, +BS  Neuro: Alert and oriented. No new focal deficits. Ext: Warm, no deformities, decreased muscle bulk Skin: No new rashes, lesions or ulcers on visualized skin   Assessment & Plan: Dyspnea/acute respiratory insufficiency and mild respiratory distress Small to moderate pleural effusion on right Lung cancer with mets to liver/ribs/scapula/vertebrae: Cancer-related pain: Patient presentation suspect due to his lung cancer with mets.  No obvious pneumonia, no PE has mild effusion.he is currently on palliative radiation, recently he was offered palliative/hospice services but patient declined and wanted to pursue further treatment.  Will start on pain management oral IV opiates, scheduled Tylenol, consult palliative care, will need to notify his rad/oncology in the morning.  No indication for thoracentesis, repeat chest x-ray in the morning.  Continue bronchodilators as needed, supplemental oxygen.  Overall poor prognosis - Continue decadron.  - Continue prn IV dilaudid, relying mostly on oxycodone, getting many doses per day. Will start oxycontin 10mg  q12h   Decreased vision: CT head negative, by history it sounds refractive.  - Follow up with ophthalmology.   SIRS: w/ tachycarda with left shift. No actual leukocytosis, no fever. Not covering with antibiotics at this time.   Lactic acidosis: Elevated lactic of 4.1, cleared with IVF. HD stable with no source of infection. Metformin likely contributed in addition to liver-metastasis-associated diminished clearance. - Modify to LR with acidosis. Monitor intake, consider DC'ing IVF.    Thrombocytopenia:  - Monitor. Hold DOAC if goes < 50k or  if bleeding, recheck in AM  Hypercalcemia of malignancy: Improving. - Will monitor off fluids tonight.   Recent PE: Not seen on repeat CTA chest this admission. Difficult  situation since he did have acute PE and continues to have malignancy-associated hypercoagulability. Will need to discuss goals of care further with pt/family to decide on outpatient anticoagulation.    History of stroke: Recently Plavix was stopped as he was placed on anticoagulation.   - Continue statin.   Hypertension: - Continue metoprolol.    T2DM: HbA1c 6.7%.  - Hold metformin (possible contributor/cause of lactic acid elevation).  - Continue SSI.    Ascending aortic aneurysm Aortic atherosclerosis: - Semiannual imaging of 4.6cm thoracic aneurysm and q2 years imaging of 3.6cm infrarenal aneurysm.    GOC: Poor prognosis, remains DNR - Unclear whether he is a candidate for lung CA Tx with worsening debility.   Debility:  - PT/OT consulted, suspect he would require SNF rehabilitation at his current functional level.   [name], MD [facility] [email or web address] 01/13/2023, 4:43 PM

## 2023-01-13 NOTE — Progress Notes (Signed)
PT Cancellation Note  Patient Details Name: Gavin Dennis MRN: [id number] DOB: 10/11/1950   Cancelled Treatment:    Reason Eval/Treat Not Completed: (P) Pain limiting ability to participate; nurse premedicated prior to session; upon entry pt refusing to participate citing pain, refusing additional attempts this date. We will continue to follow acutely and will try back as schedule and pt status allows which may be another day.  Jamesetta Geralds, PT, DPT [facility] Rehabilitation Department Office: [phone]   Jamesetta Geralds 01/13/2023, 2:26 PM

## 2023-01-13 NOTE — Progress Notes (Signed)
OT Cancellation Note  Patient Details Name: Gavin Dennis MRN: [id number] DOB: October 07, 1950   Cancelled Treatment:    Reason Eval/Treat Not Completed: Pain limiting ability to participate Patient reporting back pain was too much at rest to participate in session. OT to continue to follow.  Rosalio Loud, MS Acute Rehabilitation Department Office# [phone]  01/13/2023, 1:34 PM

## 2023-01-14 ENCOUNTER — Encounter (HOSPITAL_COMMUNITY): Payer: Self-pay | Admitting: Internal Medicine

## 2023-01-14 DIAGNOSIS — R52 Pain, unspecified: Secondary | ICD-10-CM | POA: Diagnosis not present

## 2023-01-14 LAB — GLUCOSE, CAPILLARY
Glucose-Capillary: 132 mg/dL — ABNORMAL HIGH (ref 70–99)
Glucose-Capillary: 108 mg/dL — ABNORMAL HIGH (ref 70–99)
Glucose-Capillary: 124 mg/dL — ABNORMAL HIGH (ref 70–99)
Glucose-Capillary: 120 mg/dL — ABNORMAL HIGH (ref 70–99)

## 2023-01-14 LAB — BASIC METABOLIC PANEL
Anion gap: 9 (ref 5–15)
BUN: 22 mg/dL (ref 8–23)
CO2: 17 mmol/L — ABNORMAL LOW (ref 22–32)
Calcium: 9.3 mg/dL (ref 8.9–10.3)
Chloride: 110 mmol/L (ref 98–111)
Creatinine, Ser: 0.79 mg/dL (ref 0.61–1.24)
GFR, Estimated: >60 mL/min (ref >60)
Glucose, Bld: 137 mg/dL — ABNORMAL HIGH (ref 70–99)
Potassium: 3.2 mmol/L — ABNORMAL LOW (ref 3.5–5.1)
Sodium: 136 mmol/L (ref 135–145)

## 2023-01-14 LAB — CBC
HCT: 38.3 % — ABNORMAL LOW (ref 39.0–52.0)
Hemoglobin: 13.2 g/dL (ref 13.0–17.0)
MCH: 33.4 pg (ref 26.0–34.0)
MCHC: 34.5 g/dL (ref 30.0–36.0)
MCV: 97 fL (ref 80.0–100.0)
Platelets: 46 10*3/uL — ABNORMAL LOW (ref 150–400)
RBC: 3.95 MIL/uL — ABNORMAL LOW (ref 4.22–5.81)
RDW: 15.5 % (ref 11.5–15.5)
WBC: 5.2 10*3/uL (ref 4.0–10.5)
nRBC: 0.4 % — ABNORMAL HIGH (ref 0.0–0.2)

## 2023-01-14 LAB — IMMATURE PLATELET FRACTION: Immature Platelet Fraction: 5.3 % (ref 1.2–8.6)

## 2023-01-14 MED ORDER — ENOXAPARIN SODIUM 40 MG/0.4ML IJ SOSY: 40.0000 mg | PREFILLED_SYRINGE | INTRAMUSCULAR | Status: DC

## 2023-01-14 MED ORDER — HEPARIN SODIUM (PORCINE) 5000 UNIT/ML IJ SOLN
5000.0000 [IU] | Freq: Three times a day (TID) | INTRAMUSCULAR | Status: DC
  Administered 2023-01-14 – 2023-01-15 (×3): 5000 [IU] via SUBCUTANEOUS
  Filled 2023-01-14 (×4): qty 1

## 2023-01-14 NOTE — Progress Notes (Addendum)
Pt tachypneic, triggering yellow MEWS protocol. Some c/o pain, will medicate and continue to monitor.  MEWS Guidelines - (patients age 71 and over)    01/14/23 1326  Assess: MEWS Score  Temp (!) 97.5 F (36.4 C)  BP 104/77  MAP (mmHg) 86  Pulse Rate 93  Resp (!) 26  SpO2 95 %  O2 Device Nasal Cannula  Assess: MEWS Score  MEWS Temp 0  MEWS Systolic 0  MEWS Pulse 0  MEWS RR 2  MEWS LOC 0  MEWS Score 2  MEWS Score Color Yellow  Assess: if the MEWS score is Yellow or Red  Were vital signs accurate and taken at a resting state? Yes  Does the patient meet 2 or more of the SIRS criteria? No  MEWS guidelines implemented  Yes, yellow  Treat  MEWS Interventions Considered administering scheduled or prn medications/treatments as ordered  Take Vital Signs  Increase Vital Sign Frequency  Yellow: Q2hr x1, continue Q4hrs until patient remains green for 12hrs  Escalate  MEWS: Escalate Yellow: Discuss with charge nurse and consider notifying provider and/or RRT  Notify: Charge Nurse/RN  Name of Charge Nurse/RN Notified Zakyah Yanes RN  Assess: SIRS CRITERIA  SIRS Temperature  0  SIRS Pulse 1  SIRS Respirations  1  SIRS WBC 0  SIRS Score Sum  2

## 2023-01-14 NOTE — Progress Notes (Signed)
Physical Therapy Treatment Patient Details Name: Gavin Dennis MRN: [id number] DOB: 1950/07/22 Today's Date: 01/14/2023   History of Present Illness 72 yo male presents to therapy following hospital admission on 01/26/2023 due to uncontrolled pain. Pt recently hospitalized 8/28-9/9 secondary to tachycardia (SVT), acute PE and diarrhea and on 8/22 due to LBP and SOB. Pt found to have mild respiratory distress, small plural effusion, lactic acidosis, and pain is attributed to metastatic lung cancer. Pt PMH includes but is not limited to: SIRS, hypercalcemia, PE, CVA (2022 and 03/2022), HTN, DM II, AAA and T10 and T12 compression fx.    PT Comments  Pt very weak and unable to attempt standing today, but was agreeable to sit EOB.  Could only tolerate sitting in midline 15 seconds, otherwise R leaning.   If plan is discharge home, recommend the following: A lot of help with walking and/or transfers;A lot of help with bathing/dressing/bathroom;Assistance with cooking/housework;Assist for transportation;Help with stairs or ramp for entrance   Can travel by private vehicle        Equipment Recommendations  Wheelchair (measurements PT);Wheelchair cushion (measurements PT)    Recommendations for Other Services       Precautions / Restrictions Precautions Precautions: Fall Restrictions Weight Bearing Restrictions: No     Mobility  Bed Mobility Overal bed mobility: Needs Assistance Bed Mobility: Supine to Sit, Sit to Supine, Rolling Rolling: Max assist   Supine to sit: Max assist, +2 for physical assistance, Used rails Sit to supine: Max assist, +2 for physical assistance, Used rails   General bed mobility comments: Required MAX A of 2 with physical assistance for hand placement on rail and multi-modal cueing.  Rolling for hygiene with MAX A.    Transfers                        Ambulation/Gait                   Stairs             Wheelchair Mobility      Tilt Bed    Modified Rankin (Stroke Patients Only)       Balance Overall balance assessment: Needs assistance Sitting-balance support: Feet supported, Bilateral upper extremity supported Sitting balance-Leahy Scale: Poor   Postural control: Right lateral lean, Posterior lean                                  Cognition Arousal: Lethargic Behavior During Therapy: Flat affect Overall Cognitive Status: Difficult to assess                                 General Comments: Pt gave very few verbalizations        Exercises      General Comments General comments (skin integrity, edema, etc.): Attempted to have him cough up phlegm that was noted in mouth, but unable to. OT spoke with nursing about that.      Pertinent Vitals/Pain Pain Assessment Pain Assessment: Faces Faces Pain Scale: Hurts whole lot Pain Location: back Pain Descriptors / Indicators: Aching, Constant, Discomfort, Guarding Pain Intervention(s): Repositioned, Monitored during session, Limited activity within patient's tolerance, Premedicated before session    Home Living Family/patient expects to be discharged to:: Private residence Living Arrangements: Children;Other (Comment) Available Help at Discharge: Family;Available 24 hours/day Type of Home: House  Home Access: Stairs to enter Entrance Stairs-Rails: Right;Left;Can reach both Entrance Stairs-Number of Steps: 3   Home Layout: One level Home Equipment: Agricultural consultant (2 wheels) Additional Comments: has fall alert button/necklace    Prior Function            PT Goals (current goals can now be found in the care plan section) Progress towards PT goals: Not progressing toward goals - comment (unable to attempt standing today)    Frequency    Min 1X/week      PT Plan      Co-evaluation PT/OT/SLP Co-Evaluation/Treatment: Yes Reason for Co-Treatment: To address functional/ADL transfers;For patient/therapist  safety PT goals addressed during session: Mobility/safety with mobility OT goals addressed during session: ADL's and self-care      AM-PAC PT "6 Clicks" Mobility   Outcome Measure  Help needed turning from your back to your side while in a flat bed without using bedrails?: A Lot Help needed moving from lying on your back to sitting on the side of a flat bed without using bedrails?: A Lot Help needed moving to and from a bed to a chair (including a wheelchair)?: Total Help needed standing up from a chair using your arms (e.g., wheelchair or bedside chair)?: Total Help needed to walk in hospital room?: Total Help needed climbing 3-5 steps with a railing? : Total 6 Click Score: 8    End of Session Equipment Utilized During Treatment: Oxygen Activity Tolerance: Patient limited by fatigue;Patient limited by pain Patient left: in chair;with bed alarm set Nurse Communication: Mobility status PT Visit Diagnosis: Pain;Unsteadiness on feet (R26.81);Muscle weakness (generalized) (M62.81);Difficulty in walking, not elsewhere classified (R26.2)     Time: 1610-9604 PT Time Calculation (min) (ACUTE ONLY): 24 min  Charges:    $Therapeutic Activity: 8-22 mins PT General Charges $$ ACUTE PT VISIT: 1 Visit                     Gavin Broach., PT Office [phone] Acute Rehab 01/14/2023    Gavin Dennis 01/14/2023, 3:44 PM

## 2023-01-14 NOTE — Progress Notes (Signed)
[facility] PROGRESS NOTE  Gavin Dennis (DOB: 1950-10-04) YQM:[id number] PCP: Ollen Bowl, MD  Brief Narrative: Gavin Dennis is a 72 year old male with history of lung cancer with mets to liver/ribs/scapula/vertebrae, was offered palliative/hospice services but patient declined and wanted to pursue further treatment, neutropenia history of CVA, hypertension, history of PE, T2DM, ascending aortic aneurysm, emphysema recent hospitalization 8/28 - 9/9 for tachycardia/SVT/acute PE, diarrhea sent from radiation oncology on 822 mid to low back pain, shortness of breath. Currently he denies any pain complains of being cold mild cough.  Denies any fever chills dysuria abdominal pain nausea vomiting.   In the BM:WUXLKG tachycardia in 120s, saturating 88% on percent needing 2 L nasal cannula, BP stable in 90s to 120s Labs: Blood gas pH 7.34, pCO2 43 pO2 less than 31, BMP with stable renal function BUN 30 calcium 10.5 lactic acid 4.1, hemoglobin 15 normal WBC count blood sugar in 2012 influenza COVID screening negative. Imaging: CT angio no PE resolution of previous PE in the left upper lobe, small to moderate effusion on the right, posttreatment markings on the right lung, hilar and mediastinal adenopathy, liver metastasis, lytic lesion on left scapula and left heart rate, right fifth rib, inferior right scapula, T9, T10, T12, aortic atherosclerosis. Blood culture sent, given IV fluid bolus, DuoNeb and troponin ordered and admission was requested for further management   Subjective: Very drowsy today, says he feels sleepy and his pain is no better. He hasn't eaten very much, but is aware he will need to to regain strength.   Objective: BP 93/66 (BP Location: Left Arm)   Pulse 98   Temp 98.4 F (36.9 C) (Oral)   Resp 20   Ht 5\' 11"  (1.803 m)   Wt 63.5 kg   SpO2 96%   BMI 19.52 kg/m   Frail, chronically ill-appearing, emaciated. RRR, no MRG or crackles or wheezes. Drowsy but rousable,  slowed without focal deficit.  Assessment & Plan: Dyspnea/acute respiratory insufficiency and mild respiratory distress Small to moderate pleural effusion on right Lung cancer with mets to liver/ribs/scapula/vertebrae: Cancer-related pain: Patient presentation suspect due to his lung cancer with mets.  No obvious pneumonia, no PE has mild effusion.he is currently on palliative radiation, recently he was offered palliative/hospice services but patient declined and wanted to pursue further treatment.  Will start on pain management oral IV opiates, scheduled Tylenol, consult palliative care, will need to notify his rad/oncology in the morning.  No indication for thoracentesis, repeat chest x-ray in the morning.  Continue bronchodilators as needed, supplemental oxygen.  Overall poor prognosis - Continue decadron.  - Continue prn IV dilaudid, relying mostly on oxycodone, getting many doses per day. Started oxycontin 10mg  q12h, some drowsiness today, will hold this for now.   Decreased vision: CT head negative, by history it sounds refractive.  - Follow up with ophthalmology.   SIRS: w/ tachycarda with left shift. No actual leukocytosis, no fever. Not covering with antibiotics at this time.   Lactic acidosis: Elevated lactic of 4.1, cleared with IVF. HD stable with no source of infection. Metformin likely contributed in addition to liver-metastasis-associated diminished clearance. Verlon Au now monitoring off IVF. Not great po intake, noting hypokalemia, metabolic acidosis without anion gap. Will monitor another day and step in with IVF if needed. This would present another layer for the patient's poor prognosis.   Thrombocytopenia:  - Monitor. Holding DOAC with level < 50k. Low suspicion for HIT. IPF not elevated to the level that would be expected,  so suspect marrow involvement of mets decreasing thrombopoiesis.  Hypercalcemia of malignancy: Improved. Slightly up when off IVF.  - Recheck w/albumin  in AM.    Recent PE: Not seen on repeat CTA chest this admission. Difficult situation since he did have acute PE and continues to have malignancy-associated hypercoagulability. Now with platelet count < 50k.  - Given the absence of PE on last scan but balancing his very high risk for VTE as well as bleeding, we will give [place] heparin at VTE ppx dose and monitor closely. Will need to discuss goals of care further with pt/family to decide on outpatient anticoagulation.    History of stroke: Recently Plavix was stopped as he was placed on anticoagulation.   - Continue statin.   Hypertension: - Continue metoprolol.    T2DM: HbA1c 6.7%.  - Holding metformin (possible contributor/cause of lactic acid elevation).  - Continue SSI.    Ascending aortic aneurysm Aortic atherosclerosis: - Semiannual imaging of 4.6cm thoracic aneurysm and q2 years imaging of 3.6cm infrarenal aneurysm.    GOC: Poor prognosis, remains DNR - Unclear whether he is a candidate for lung CA Tx with worsening debility. Palliative care speaking with family, pt's DIL who is involved in his care. Family meeting planned today but not possible since she had to go in to work early. Will reschedule.  Debility:  - PT/OT consulted, suspect he would require SNF rehabilitation at his current functional level.   [name], MD [facility] [email or web address] 01/14/2023, 3:48 PM

## 2023-01-14 NOTE — Evaluation (Signed)
Occupational Therapy Evaluation Patient Details Name: Gavin Dennis MRN: [id number] DOB: 07/22/1950 Today's Date: 01/14/2023   History of Present Illness 72 yo male presents to therapy following hospital admission on 01/25/2023 due to uncontrolled pain. Pt recently hospitalized 8/28-9/9 secondary to tachycardia (SVT), acute PE and diarrhea and on 8/22 due to LBP and SOB. Pt found to have mild respiratory distress, small plural effusion, lactic acidosis, and pain is attributed to metastatic lung cancer. Pt PMH includes but is not limited to: SIRS, hypercalcemia, PE, CVA (2022 and 03/2022), HTN, DM II, AAA and T10 and T12 compression fx.   Clinical Impression   Patient is a 72 year old male who  was admitted for above. Patient's chart reported patient was living home with children assistance for ADLs. Currently, patient is +2 for bed mobility with sever kyphotic posture sitting EOB with patient having continued pain with all movements. Patient will need 24/7 caregiver A in next level of care. OT to continue to follow with pending GOC meeting at this time. Patients rehab potential is guarded at this time.      If plan is discharge home, recommend the following: Two people to help with walking and/or transfers;Two people to help with bathing/dressing/bathroom;Direct supervision/assist for medications management;Assistance with cooking/housework;Assist for transportation;Help with stairs or ramp for entrance;Direct supervision/assist for financial management;Assistance with feeding    Functional Status Assessment  Patient has had a recent decline in their functional status and/or demonstrates limited ability to make significant improvements in function in a reasonable and predictable amount of time  Equipment Recommendations  None recommended by OT       Precautions / Restrictions Precautions Precautions: Fall Restrictions Weight Bearing Restrictions: No      Mobility Bed Mobility Overal bed  mobility: Needs Assistance Bed Mobility: Sit to Supine, Supine to Sit     Supine to sit: Max assist, +2 for physical assistance, +2 for safety/equipment Sit to supine: Used rails, HOB elevated, Max assist, +2 for safety/equipment, +2 for physical assistance   General bed mobility comments: increased time and A for log rolling and transitioning to and from EOB. patient had 15 seconds of maintaining midline posture with CGA        Balance Overall balance assessment: Needs assistance Sitting-balance support: Feet supported, Bilateral upper extremity supported Sitting balance-Leahy Scale: Poor   Postural control: Posterior lean, Other (comment) (severly kyphotic posture)         ADL either performed or assessed with clinical judgement   ADL Overall ADL's : Needs assistance/impaired Eating/Feeding: Total assistance;Bed level Eating/Feeding Details (indicate cue type and reason): patient was noted to have reminance of food in mouth with patient refusing to participate in oral care at this time. nurse made aware. patient would benefit from yonker. nurse to set one up for patient. Grooming: Bed level;Total assistance   Upper Body Bathing: Bed level;Total assistance   Lower Body Bathing: Bed level;Total assistance   Upper Body Dressing : Bed level;Total assistance   Lower Body Dressing: Bed level;Total assistance     Toilet Transfer Details (indicate cue type and reason): patient was +2 for sitting EOB with patient resisitive to movements with BUE instead of helping with movements. patient with sever kyphotic posture sitting EOB with head forwards. patient breifly able to bring self into midline with CGA but unable to sustain past 15 seconds. Toileting- Clothing Manipulation and Hygiene: Bed level;Total assistance Toileting - Clothing Manipulation Details (indicate cue type and reason): rolling back and forth to complete hygiene after  sitting EOB. small soft BM passed with nurse made  aware.             Vision   Additional Comments: patient preferring to keep head down sitting EOB and eyes closed during most of session.            Pertinent Vitals/Pain Pain Assessment Pain Assessment: Faces Faces Pain Scale: Hurts whole lot Pain Location: back Pain Descriptors / Indicators: Aching, Constant, Discomfort, Guarding Pain Intervention(s): Limited activity within patient's tolerance, Monitored during session, Premedicated before session, Repositioned     Extremity/Trunk Assessment Upper Extremity Assessment Upper Extremity Assessment: Generalized weakness (unable to formally complete MMT or ROM with patients current status and pain levels.)   Lower Extremity Assessment Lower Extremity Assessment: Defer to PT evaluation   Cervical / Trunk Assessment Cervical / Trunk Assessment: Kyphotic   Communication     Cognition Arousal: Lethargic Behavior During Therapy: Flat affect Overall Cognitive Status: Difficult to assess       General Comments: patient very soft spoken during session with minmal verbalizations.                Home Living Family/patient expects to be discharged to:: Private residence Living Arrangements: Children;Other (Comment) Available Help at Discharge: Family;Available 24 hours/day Type of Home: House Home Access: Stairs to enter [employer] of Steps: 3 Entrance Stairs-Rails: Right;Left;Can reach both Home Layout: One level     Bathroom Shower/Tub: Chief Strategy Officer: Standard     Home Equipment: Agricultural consultant (2 wheels)   Additional Comments: has fall alert button/necklace      Prior Functioning/Environment Prior Level of Function : Needs assist             Mobility Comments: pt indicated non ambulatory since he "broke his back" ambulation NT at last hospitalization, son assist for steps ADLs Comments: daughter in law assists with bathing/dressing, pt voids in the bed        OT  Problem List: Decreased activity tolerance;Impaired balance (sitting and/or standing);Decreased coordination;Decreased safety awareness;Decreased knowledge of precautions;Decreased knowledge of use of DME or AE      OT Treatment/Interventions: Self-care/ADL training;Therapeutic exercise;DME and/or AE instruction;Therapeutic activities;Patient/family education;Balance training    OT Goals(Current goals can be found in the care plan section) Acute Rehab OT Goals Patient Stated Goal: none stated OT Goal Formulation: Patient unable to participate in goal setting Time For Goal Achievement: 01/28/23 Potential to Achieve Goals: Fair  OT Frequency: Min 1X/week    Co-evaluation PT/OT/SLP Co-Evaluation/Treatment: Yes Reason for Co-Treatment: To address functional/ADL transfers;For patient/therapist safety PT goals addressed during session: Mobility/safety with mobility OT goals addressed during session: ADL's and self-care      AM-PAC OT "6 Clicks" Daily Activity     Outcome Measure Help from another person eating meals?: Total Help from another person taking care of personal grooming?: Total Help from another person toileting, which includes using toliet, bedpan, or urinal?: Total Help from another person bathing (including washing, rinsing, drying)?: Total Help from another person to put on and taking off regular upper body clothing?: Total Help from another person to put on and taking off regular lower body clothing?: Total 6 Click Score: 6   End of Session Nurse Communication: Other (comment) (patients refusal for oral care and concerns over status)  Activity Tolerance: Patient limited by pain Patient left: in bed;with call bell/phone within reach;with bed alarm set  OT Visit Diagnosis: Pain;Unsteadiness on feet (R26.81);Muscle weakness (generalized) (M62.81)  Time: 0272-5366 OT Time Calculation (min): 24 min Charges:  OT General Charges $OT Visit: 1 Visit OT  Evaluation $OT Eval Moderate Complexity: 1 Mod  Murrell Dome OTR/L, MS Acute Rehabilitation Department Office# [phone]   Selinda Flavin 01/14/2023, 3:19 PM

## 2023-01-15 ENCOUNTER — Inpatient Hospital Stay (HOSPITAL_COMMUNITY): Payer: Medicare HMO

## 2023-01-15 ENCOUNTER — Ambulatory Visit
Admit: 2023-01-15 | Discharge: 2023-01-15 | Disposition: A | Discharge status: Home / Self Care | Payer: Medicare HMO | Attending: Radiation Oncology

## 2023-01-15 ENCOUNTER — Other Ambulatory Visit: Payer: Self-pay

## 2023-01-15 DIAGNOSIS — R52 Pain, unspecified: Secondary | ICD-10-CM | POA: Diagnosis not present

## 2023-01-15 LAB — RAD ONC ARIA SESSION SUMMARY
Course Elapsed Days: 12
Plan Fractions Treated to Date: 7
Plan Prescribed Dose Per Fraction: 3 Gy
Plan Total Fractions Prescribed: 10
Plan Total Prescribed Dose: 30 Gy
Reference Point Dosage Given to Date: 21 Gy
Reference Point Session Dosage Given: 3 Gy
Session Number: 7

## 2023-01-15 LAB — PROCALCITONIN: Procalcitonin: 30.53 ng/mL

## 2023-01-15 LAB — BASIC METABOLIC PANEL
Anion gap: 10 (ref 5–15)
BUN: 38 mg/dL — ABNORMAL HIGH (ref 8–23)
CO2: 20 mmol/L — ABNORMAL LOW (ref 22–32)
Calcium: 9.3 mg/dL (ref 8.9–10.3)
Chloride: 110 mmol/L (ref 98–111)
Creatinine, Ser: 0.93 mg/dL (ref 0.61–1.24)
GFR, Estimated: >60 mL/min (ref >60)
Glucose, Bld: 154 mg/dL — ABNORMAL HIGH (ref 70–99)
Potassium: 3.8 mmol/L (ref 3.5–5.1)
Sodium: 140 mmol/L (ref 135–145)

## 2023-01-15 LAB — TROPONIN I (HIGH SENSITIVITY): Troponin I (High Sensitivity): 61 ng/L — ABNORMAL HIGH (ref <18)

## 2023-01-15 LAB — CBC
HCT: 39.3 % (ref 39.0–52.0)
Hemoglobin: 13.6 g/dL (ref 13.0–17.0)
MCH: 33.8 pg (ref 26.0–34.0)
MCHC: 34.6 g/dL (ref 30.0–36.0)
MCV: 97.8 fL (ref 80.0–100.0)
Platelets: 52 10*3/uL — ABNORMAL LOW (ref 150–400)
RBC: 4.02 MIL/uL — ABNORMAL LOW (ref 4.22–5.81)
RDW: 15.9 % — ABNORMAL HIGH (ref 11.5–15.5)
WBC: 4.7 10*3/uL (ref 4.0–10.5)
nRBC: 0 % (ref 0.0–0.2)

## 2023-01-15 LAB — BLOOD GAS, ARTERIAL
Acid-base deficit: 5.6 mmol/L — ABNORMAL HIGH (ref 0.0–2.0)
Bicarbonate: 16 mmol/L — ABNORMAL LOW (ref 20.0–28.0)
Drawn by: 20012
O2 Saturation: 88.2 %
Patient temperature: 37
pCO2 arterial: 22 mmHg — ABNORMAL LOW (ref 32–48)
pH, Arterial: 7.47 — ABNORMAL HIGH (ref 7.35–7.45)
pO2, Arterial: 53 mmHg — ABNORMAL LOW (ref 83–108)

## 2023-01-15 LAB — GLUCOSE, CAPILLARY
Glucose-Capillary: 112 mg/dL — ABNORMAL HIGH (ref 70–99)
Glucose-Capillary: 141 mg/dL — ABNORMAL HIGH (ref 70–99)
Glucose-Capillary: 134 mg/dL — ABNORMAL HIGH (ref 70–99)
Glucose-Capillary: 117 mg/dL — ABNORMAL HIGH (ref 70–99)

## 2023-01-15 LAB — MRSA NEXT GEN BY PCR, NASAL: MRSA by PCR Next Gen: NOT DETECTED

## 2023-01-15 LAB — CULTURE, BLOOD (ROUTINE X 2)
Culture: NO GROWTH
Culture: NO GROWTH
Special Requests: ADEQUATE
Special Requests: ADEQUATE

## 2023-01-15 LAB — LACTIC ACID, PLASMA
Lactic Acid, Venous: 4.5 mmol/L (ref 0.5–1.9)
Lactic Acid, Venous: 4.7 mmol/L (ref 0.5–1.9)

## 2023-01-15 MED ORDER — LACTATED RINGERS IV SOLN: INTRAVENOUS | Status: DC

## 2023-01-15 MED ORDER — IOHEXOL 350 MG/ML SOLN
100.0000 mL | Freq: Once | INTRAVENOUS | Status: AC | PRN
  Administered 2023-01-15: 75 mL via INTRAVENOUS

## 2023-01-15 MED ORDER — SODIUM CHLORIDE 0.9 % IV BOLUS
500.0000 mL | Freq: Once | INTRAVENOUS | Status: AC
  Administered 2023-01-15: 500 mL via INTRAVENOUS

## 2023-01-15 MED ORDER — HYDROMORPHONE HCL 1 MG/ML IJ SOLN
0.5000 mg | INTRAMUSCULAR | Status: DC | PRN
  Administered 2023-01-15 – 2023-01-16 (×6): 1 mg via INTRAVENOUS
  Filled 2023-01-15 (×6): qty 1

## 2023-01-15 MED ORDER — ACETAMINOPHEN 500 MG PO TABS: 1000.0000 mg | ORAL_TABLET | Freq: Four times a day (QID) | ORAL | Status: DC | PRN

## 2023-01-15 MED ORDER — VANCOMYCIN HCL 1500 MG/300ML IV SOLN
1500.0000 mg | Freq: Every day | INTRAVENOUS | Status: DC
  Administered 2023-01-15: 1500 mg via INTRAVENOUS
  Filled 2023-01-15 (×2): qty 300

## 2023-01-15 MED ORDER — ACETAMINOPHEN 650 MG RE SUPP
650.0000 mg | Freq: Four times a day (QID) | RECTAL | Status: DC | PRN
  Administered 2023-01-15: 650 mg via RECTAL
  Filled 2023-01-15: qty 1

## 2023-01-15 MED ORDER — HYDROMORPHONE HCL 1 MG/ML IJ SOLN
0.5000 mg | INTRAMUSCULAR | Status: DC | PRN
  Administered 2023-01-15: 1 mg via INTRAVENOUS
  Filled 2023-01-15: qty 1

## 2023-01-15 MED ORDER — SODIUM CHLORIDE 0.9 % IV SOLN
2.0000 g | Freq: Three times a day (TID) | INTRAVENOUS | Status: DC
  Administered 2023-01-15 – 2023-01-16 (×2): 2 g via INTRAVENOUS
  Filled 2023-01-15 (×2): qty 12.5

## 2023-01-15 NOTE — Progress Notes (Signed)
[facility] PROGRESS NOTE  Gavin Dennis (DOB: [birth year]) WJX:[id number] PCP: Ollen Bowl, MD  Brief Narrative: Gavin Dennis is a 72 year old male with history of lung cancer with mets to liver/ribs/scapula/vertebrae, was offered palliative/hospice services but patient declined and wanted to pursue further treatment, neutropenia history of CVA, hypertension, history of PE, T2DM, ascending aortic aneurysm, emphysema recent hospitalization 8/28 - 9/9 for tachycardia/SVT/acute PE, diarrhea sent from radiation oncology on 822 mid to low back pain, shortness of breath. Currently he denies any pain complains of being cold mild cough.  Denies any fever chills dysuria abdominal pain nausea vomiting.   In the OZ:HYQMVH tachycardia in 120s, saturating 88% on percent needing 2 L nasal cannula, BP stable in 90s to 120s Labs: Blood gas pH 7.34, pCO2 43 pO2 less than 31, BMP with stable renal function BUN 30 calcium 10.5 lactic acid 4.1, hemoglobin 15 normal WBC count blood sugar in 2012 influenza COVID screening negative. Imaging: CT angio no PE resolution of previous PE in the left upper lobe, small to moderate effusion on the right, posttreatment markings on the right lung, hilar and mediastinal adenopathy, liver metastasis, lytic lesion on left scapula and left heart rate, right fifth rib, inferior right scapula, T9, T10, T12, aortic atherosclerosis. Blood culture sent, given IV fluid bolus, DuoNeb and troponin ordered and admission was requested for further management   Subjective: [name] returned from radiation therapy tachypneic, extremely tachycardic, and slightly more hypoxic than his baseline. He has no fever or chills, he actually does not say he feels short of breath, he does not say he's in pain. He speaks one word answers and is not cooperative with exam.   Objective: BP 102/83 (BP Location: Right Arm)   Pulse (!) 157   Temp 98.7 F (37.1 C) (Oral)   Resp (!) 28   Ht 5\' 11"   (1.803 m)   Wt 63.5 kg   SpO2 92%   BMI 19.52 kg/m   Gen: Frail elderly thin male breathing not in distress Pulm: Crackles on right, no wheezes, tachypneic  CV: Regular tachycardia. No swelling in legs.  GI: Soft, not distended   Assessment & Plan: Dyspnea/acute respiratory insufficiency and mild respiratory distress Small to moderate pleural effusion on right Lung cancer with mets to liver/ribs/scapula/vertebrae: Cancer-related pain: Patient presentation suspect due to his lung cancer with mets.  No obvious pneumonia, no PE has mild effusion.he is currently on palliative radiation, recently he was offered palliative/hospice services but patient declined and wanted to pursue further treatment.  Will start on pain management oral IV opiates, scheduled Tylenol, consult palliative care, will need to notify his rad/oncology in the morning.  No indication for thoracentesis, repeat chest x-ray in the morning.  Continue bronchodilators as needed, supplemental oxygen.  Overall poor prognosis. I specifically recommended that we not even discuss chemotherapy at this time. It would not just be futile, but actively bad to give him chemo with such frailty. - Continue decadron.  - Continue prn IV dilaudid, relying mostly on oxycodone, getting many doses per day. Started oxycontin 10mg  q12h, some drowsiness today, will hold this for now. - Lytic lesions and left adrenal thickening consistent with metastatic disease on CT today.  Aspiration pneumonia: Worsening hypoxia, tachypnea, tachycardia: Abrupt onset.  - Check CTA chest > this shows advancing opacity in right mid/lower lung. He has been slumped on his right side today, suspect secretions were aspirated during radiation therapy. Started broad antibiotics, giving IVF, will transfer to progressive care unit.  -  I have spoken with the patient's daughter in law and son by phone to let them know of his worsening clinical status, my fear that he could continue  decompensating over the next hours-days. I confirmed their/the patient's wishes are definitely to avoid intubation, and cardiopulmonary resuscitation. No escalation to ICU level of care. We will treat aggressively with these measures above, but if his status worsens, we would give him morphine and keep him comfortable, free of any pain or shortness of breath. I called this hospice and comfort measures and measured his probable prognosis in hours at that point. We will monitor him closely and update family as appropriate. I personally will speak with them tomorrow as well.  - Blood cultures and procalcitonin pending.   Decreased vision: CT head negative, by history it sounds refractive.  - Follow up with ophthalmology  Lactic acidosis: Recurrent, due to respiratory distress.  - Supplement O2, optimize perfusion with IVF. No hypotension, and wouldn't start pressors regardless.     Thrombocytopenia:  - Monitor again in AM. Holding DOAC with level ~50k. Low suspicion for HIT. IPF not elevated to the level that would be expected, so suspect marrow involvement of mets decreasing thrombopoiesis.  Hypercalcemia of malignancy: Improved. Slightly up when off IVF.  - Recheck w/albumin in AM.    Recent PE: Not seen on repeat CTA chest this admission. Also not seen on repeat CTA today. Difficult situation since he did have acute PE and continues to have malignancy-associated hypercoagulability. Now with platelet count < 50k.  - Given the absence of PE on recent scans but balancing his very high risk for VTE as well as bleeding, we will give [place] heparin at VTE ppx dose and monitor closely. Discussed briefly with family, will let clinical course guide further discussions.   History of stroke: Recently plavix was stopped as he was placed on anticoagulation.   - Continue statin.   Hypertension: - Continue metoprolol.    T2DM: HbA1c 6.7%.  - Holding metformin (possible contributor/cause of lactic acid  elevation).  - Continue SSI.    Ascending aortic aneurysm Aortic atherosclerosis: - Semiannual imaging of 4.6cm thoracic aneurysm and q2 years imaging of 3.6cm infrarenal aneurysm.    GOC: Poor prognosis, remains DNR - Unclear whether he is a candidate for lung CA Tx with worsening debility. Palliative care speaking with family, pt's DIL who is involved in his care. Family meeting planned today but not possible since she had to go in to work early. Will reschedule.  Debility:  - PT/OT consulted, suspect he would require SNF rehabilitation at his current functional level.   [name], MD [facility] [email or web address] 01/15/2023, 6:23 PM

## 2023-01-15 NOTE — Care Management Important Message (Signed)
Important Message  Patient Details IM Letter given. Name: Gavin Dennis MRN: [id number] Date of Birth: 09-Jan-1951   Medicare Important Message Given:  Yes     [name] 01/15/2023, 9:20 AM

## 2023-01-15 NOTE — Progress Notes (Signed)
Pharmacy Antibiotic Note  Gavin Dennis is a 72 y.o. male admitted on 01/16/2023 with uncontrolled cancer pain and SOB. On 9/16; pt returned from radiation therapy with tachypnea, tachycardia, and hypoxia. Pharmacy has been consulted for vancomycin and cefepime dosing for suspected HAP.  Plan: Vancomycin 1500 mg q24 hr (est AUC 550 based on SCr 0.93; Vd 0.72) Measure vancomycin AUC at steady state as indicated SCr q48 while on vanc MRSA PCR ordered; f/u and narrow vanc as appropriate Cefepime 2 g IV q8 hr   Height: 5\' 11"  (180.3 cm) Weight: 63.5 kg (139 lb 15.9 oz) IBW/kg (Calculated) : 75.3  Temp (24hrs), Avg:98.1 F (36.7 C), Min:97.8 F (36.6 C), Max:98.6 F (37 C)  Recent Labs  Lab 01/15/2023 1627 01/09/2023 1716 01/02/2023 1922 01/24/2023 2327 01/11/23 0601 01/12/23 0538 01/13/23 0712 01/14/23 0544 01/15/23 0604 01/15/23 1344 01/15/23 1653  WBC  --   --   --   --  9.5 8.9 7.2 5.2 4.7  --   --   CREATININE 0.90  --   --   --  0.66 0.62  --  0.79  --  0.93  --   LATICACIDVEN  --    < > 2.6* 3.1* 1.8  --   --   --   --  4.5* 4.7*   < > = values in this interval not displayed.    Estimated Creatinine Clearance: 65.4 mL/min (by C-G formula based on SCr of 0.93 mg/dL).    No Known Allergies  Antimicrobials this admission: 9/16 vancomycin >>  9/16 cefepime >>   Dose adjustments this admission: N/a  Microbiology results: 9/16 BCx: sent 9/16 MRSA PCR: needs collected  Thank you for allowing pharmacy to be a part of this patient's care.  Avya Flavell A 01/15/2023 5:38 PM

## 2023-01-16 ENCOUNTER — Ambulatory Visit: Payer: Medicare HMO

## 2023-01-16 ENCOUNTER — Inpatient Hospital Stay: Payer: Medicare HMO

## 2023-01-16 DIAGNOSIS — T17908A Unspecified foreign body in respiratory tract, part unspecified causing other injury, initial encounter: Secondary | ICD-10-CM | POA: Diagnosis not present

## 2023-01-16 DIAGNOSIS — Z515 Encounter for palliative care: Secondary | ICD-10-CM | POA: Diagnosis not present

## 2023-01-16 DIAGNOSIS — R52 Pain, unspecified: Secondary | ICD-10-CM | POA: Diagnosis not present

## 2023-01-16 DIAGNOSIS — C3490 Malignant neoplasm of unspecified part of unspecified bronchus or lung: Secondary | ICD-10-CM | POA: Diagnosis not present

## 2023-01-16 LAB — CBC
HCT: 33.9 % — ABNORMAL LOW (ref 39.0–52.0)
Hemoglobin: 11.8 g/dL — ABNORMAL LOW (ref 13.0–17.0)
MCH: 33 pg (ref 26.0–34.0)
MCHC: 34.8 g/dL (ref 30.0–36.0)
MCV: 94.7 fL (ref 80.0–100.0)
Platelets: 43 10*3/uL — ABNORMAL LOW (ref 150–400)
RBC: 3.58 MIL/uL — ABNORMAL LOW (ref 4.22–5.81)
RDW: 16.4 % — ABNORMAL HIGH (ref 11.5–15.5)
WBC: 4 10*3/uL (ref 4.0–10.5)
nRBC: 0 % (ref 0.0–0.2)

## 2023-01-16 LAB — RENAL FUNCTION PANEL
Albumin: 1.9 g/dL — ABNORMAL LOW (ref 3.5–5.0)
Anion gap: 11 (ref 5–15)
BUN: 47 mg/dL — ABNORMAL HIGH (ref 8–23)
CO2: 18 mmol/L — ABNORMAL LOW (ref 22–32)
Calcium: 9.1 mg/dL (ref 8.9–10.3)
Chloride: 112 mmol/L — ABNORMAL HIGH (ref 98–111)
Creatinine, Ser: 1.01 mg/dL (ref 0.61–1.24)
GFR, Estimated: >60 mL/min (ref >60)
Glucose, Bld: 136 mg/dL — ABNORMAL HIGH (ref 70–99)
Phosphorus: 3.6 mg/dL (ref 2.5–4.6)
Potassium: 3.5 mmol/L (ref 3.5–5.1)
Sodium: 141 mmol/L (ref 135–145)

## 2023-01-16 LAB — GLUCOSE, CAPILLARY
Glucose-Capillary: 102 mg/dL — ABNORMAL HIGH (ref 70–99)
Glucose-Capillary: 103 mg/dL — ABNORMAL HIGH (ref 70–99)

## 2023-01-16 MED ORDER — GLYCOPYRROLATE 1 MG PO TABS: 1.0000 mg | ORAL_TABLET | ORAL | Status: DC | PRN

## 2023-01-16 MED ORDER — LORAZEPAM 2 MG/ML PO CONC: 1.0000 mg | ORAL | Status: DC | PRN

## 2023-01-16 MED ORDER — GLYCOPYRROLATE 0.2 MG/ML IJ SOLN: 0.2000 mg | INTRAMUSCULAR | Status: DC | PRN

## 2023-01-16 MED ORDER — HALOPERIDOL LACTATE 5 MG/ML IJ SOLN: 0.5000 mg | INTRAMUSCULAR | Status: DC | PRN

## 2023-01-16 MED ORDER — LORAZEPAM 2 MG/ML IJ SOLN
1.0000 mg | INTRAMUSCULAR | Status: DC | PRN
  Administered 2023-01-16 (×2): 1 mg via INTRAVENOUS
  Filled 2023-01-16: qty 1

## 2023-01-16 MED ORDER — LORAZEPAM 1 MG PO TABS: 1.0000 mg | ORAL_TABLET | ORAL | Status: DC | PRN

## 2023-01-16 MED ORDER — SODIUM CHLORIDE 0.9 % IV BOLUS
250.0000 mL | Freq: Once | INTRAVENOUS | Status: AC
  Administered 2023-01-16: 250 mL via INTRAVENOUS

## 2023-01-16 MED ORDER — GLYCOPYRROLATE 0.2 MG/ML IJ SOLN
0.1000 mg | Freq: Once | INTRAMUSCULAR | Status: AC
  Administered 2023-01-16: 0.1 mg via INTRAVENOUS
  Filled 2023-01-16: qty 0.5

## 2023-01-16 MED ORDER — HALOPERIDOL LACTATE 2 MG/ML PO CONC: 0.5000 mg | ORAL | Status: DC | PRN

## 2023-01-16 MED ORDER — ONDANSETRON 4 MG PO TBDP: 4.0000 mg | ORAL_TABLET | Freq: Four times a day (QID) | ORAL | Status: DC | PRN

## 2023-01-16 MED ORDER — SODIUM CHLORIDE 0.9 % IV BOLUS
500.0000 mL | Freq: Once | INTRAVENOUS | Status: AC
  Administered 2023-01-16: 500 mL via INTRAVENOUS

## 2023-01-16 MED ORDER — SODIUM CHLORIDE 0.9 % IV SOLN
3.0000 g | Freq: Four times a day (QID) | INTRAVENOUS | Status: DC
  Administered 2023-01-16: 3 g via INTRAVENOUS
  Filled 2023-01-16 (×2): qty 8

## 2023-01-16 MED ORDER — ONDANSETRON HCL 4 MG/2ML IJ SOLN: 4.0000 mg | Freq: Four times a day (QID) | INTRAMUSCULAR | Status: DC | PRN

## 2023-01-16 MED ORDER — HYDROMORPHONE BOLUS VIA INFUSION: 1.0000 mg | INTRAVENOUS | Status: DC | PRN

## 2023-01-16 MED ORDER — POLYVINYL ALCOHOL 1.4 % OP SOLN: 1.0000 [drp] | Freq: Four times a day (QID) | OPHTHALMIC | Status: DC | PRN

## 2023-01-16 MED ORDER — HYDROMORPHONE HCL-NACL 50-0.9 MG/50ML-% IV SOLN
0.5000 mg/h | INTRAVENOUS | Status: DC
  Administered 2023-01-16 – 2023-01-18 (×2): 1 mg/h via INTRAVENOUS
  Filled 2023-01-16 (×4): qty 50

## 2023-01-16 MED ORDER — HALOPERIDOL 0.5 MG PO TABS: 0.5000 mg | ORAL_TABLET | ORAL | Status: DC | PRN

## 2023-01-16 MED ORDER — LORAZEPAM 2 MG/ML IJ SOLN
2.0000 mg | INTRAMUSCULAR | Status: AC
  Filled 2023-01-16: qty 1

## 2023-01-16 MED ORDER — HYDROMORPHONE HCL 1 MG/ML IJ SOLN: 1.0000 mg | Freq: Once | INTRAMUSCULAR | Status: DC

## 2023-01-16 NOTE — Progress Notes (Signed)
Pharmacy Antibiotic Note  Gavin Dennis is a 72 y.o. male admitted on 01/14/2023 with uncontrolled cancer pain and SOB. On 9/16; pt returned from radiation therapy with tachypnea, tachycardia, and hypoxia. Pharmacy has been consulted for Unasyn dosing for suspected HAP.  Patient originally started on vancomycin and cefepime therapy yesterday, 9/16. Now transitioning to Unasyn for anaerobic coverage given concern for aspiration pneumonia. MRSA PCR negative.   Plan: Start IV Unasyn 3g q6hrs  Monitor cultures, renal function, and overall clinical picture De-escalate antibiotics as able   Height: 5\' 11"  (180.3 cm) Weight: 63.5 kg (139 lb 15.9 oz) IBW/kg (Calculated) : 75.3  Temp (24hrs), Avg:99.3 F (37.4 C), Min:98 F (36.7 C), Max:101.2 F (38.4 C)  Recent Labs  Lab 01/09/2023 1922 01/03/2023 2327 01/11/23 0601 01/12/23 0538 01/13/23 0712 01/14/23 0544 01/15/23 0604 01/15/23 1344 01/15/23 1653 01/16/23 0508  WBC  --   --  9.5 8.9 7.2 5.2 4.7  --   --  4.0  CREATININE  --   --  0.66 0.62  --  0.79  --  0.93  --  1.01  LATICACIDVEN 2.6* 3.1* 1.8  --   --   --   --  4.5* 4.7*  --     Estimated Creatinine Clearance: 60.3 mL/min (by C-G formula based on SCr of 1.01 mg/dL).    No Known Allergies  Antimicrobials this admission: 9/16 vancomycin 1500mg  x1 9/16 cefepime >> 9/17 9/17 Unasyn >>  Dose adjustments this admission: N/a  Microbiology results: 9/16 BCx: in process 9/16 MRSA PCR: negative 9/11 Bcx: NG x5 days   Thank you for allowing pharmacy to be a part of this patient's care.  Cherylin Mylar, PharmD Clinical Pharmacist  9/17/20247:01 AM

## 2023-01-16 NOTE — Progress Notes (Signed)
When assessing patient sats continued to drop to low 80s-70s. Increased oxygen needs to 15 liters high flow. Respiratory also came to bedside. Notified Dr Jarvis Newcomer. Blood pressure has also been in the 70s-80s systolic. Bolus was started at this time as well. Spoke with daughter in law, who stated they would be in to speak with physicians at 1130.

## 2023-01-16 NOTE — Progress Notes (Signed)
[facility] PROGRESS NOTE  Gavin Dennis (DOB: 09-16-1950) [id number]:[id number] PCP: Ollen Bowl, MD  Brief Narrative: Gavin Dennis is a 72 year old male with history of lung cancer with mets to liver/ribs/scapula/vertebrae, was offered palliative/hospice services but patient declined and wanted to pursue further treatment, neutropenia history of CVA, hypertension, history of PE, T2DM, ascending aortic aneurysm, emphysema recent hospitalization 8/28 - 9/9 for tachycardia/SVT/acute PE, diarrhea sent from radiation oncology on 822 mid to low back pain, shortness of breath. Currently he denies any pain complains of being cold mild cough.  Denies any fever chills dysuria abdominal pain nausea vomiting.   In the ED: Vitals tachycardia in 120s, saturating 88% on percent needing 2 L nasal cannula, BP stable in 90s to 120s Labs: Blood gas pH 7.34, pCO2 43 pO2 less than 31, BMP with stable renal function BUN 30 calcium 10.5 lactic acid 4.1, hemoglobin 15 normal WBC count blood sugar in 2012 influenza COVID screening negative. Imaging: CT angio no PE resolution of previous PE in the left upper lobe, small to moderate effusion on the right, posttreatment markings on the right lung, hilar and mediastinal adenopathy, liver metastasis, lytic lesion on left scapula and left heart rate, right fifth rib, inferior right scapula, T9, T10, T12, aortic atherosclerosis. Blood culture sent, given IV fluid bolus, DuoNeb and troponin ordered and admission was requested for further management   Subjective: Worsening shortness of breath today, starting to get hypotensive, moans when I come in the room, otherwise not very responsive. Spoke with DIL by phone who will be coming in soon.   Objective: BP (!) 88/77 (BP Location: Right Arm) Comment: RN notified  Pulse (!) 146   Temp 97.7 F (36.5 C) (Axillary)   Resp (!) 26   Ht 5\' 11"  (1.803 m)   Wt 63.5 kg   SpO2 90%   BMI 19.52 kg/m   Gen: Emaciated gentleman in  distress Pulm: Tachypneic, coarse, upper and lower airway sounds  CV: Very tachycardic  Assessment & Plan: Dyspnea/acute respiratory insufficiency and mild respiratory distress Small to moderate pleural effusion on right Lung cancer with mets to liver/ribs/scapula/vertebrae: Cancer-related pain: Patient presentation suspect due to his lung cancer with mets.  No obvious pneumonia, no PE has mild effusion.he is currently on palliative radiation, recently he was offered palliative/hospice services but patient declined and wanted to pursue further treatment.  - Overall poor prognosis at admission, then had aspiration event, progressive pneumonia and likely treatment-refractory malignancy.  - We will continue efforts at maintaining oxygenation until family gets here. Touched base with palliative care to continue discussions, though I believe we will need to transition to comfort measures as oxygen alone is not keeping him out of respiratory distress.  - Anticipate transition to dilaudid gtt - Lytic lesions and left adrenal thickening consistent with metastatic disease on CT 9/16  Aspiration pneumonia: Worsening hypoxia, tachypnea, tachycardia. No improvement with IV fluids, antibiotics. Planning transition to comfort measures. today.     Decreased vision: CT head negative, by history it sounds refractive   Lactic acidosis: Recurrent, due to respiratory distress    Thrombocytopenia: Holding DOAC with level ~50k. Low suspicion for HIT. IPF not elevated to the level that would be expected, so suspect marrow involvement of mets decreasing thrombopoiesis.  Hypercalcemia of malignancy: Improved. Slightly up when off IVF.    Recent PE: Not seen on repeat CTA chest this admission   History of stroke: Recently plavix was stopped as he was placed on anticoagulation.  Hypertension: - Continue metoprolol.    T2DM: HbA1c 6.7%.  - Holding metformin (possible contributor/cause of lactic acid  elevation).      Ascending aortic aneurysm Aortic atherosclerosis: - Semiannual imaging of 4.6cm thoracic aneurysm and q2 years imaging of 3.6cm infrarenal aneurysm.    GOC: Poor prognosis, remains DNR/DNI  Debility:  - PT/OT consulted, suspect he would require SNF rehabilitation at his current functional level.   [name], MD [facility] [email or web address] 01/16/2023, 3:31 PM

## 2023-01-16 NOTE — Progress Notes (Signed)
Chaplain responded to consult but Gavin Dennis was resting and no family was present. Chaplain offered prayer over [name] while he slept.  Chaplain is available to provide support as needed for [name] and family. Please page if family requests Chaplain presence.    01/16/23 1300  Spiritual Encounters  Type of Visit Follow up  Care provided to: Pt not available  Interventions  Spiritual Care Interventions Made Prayer

## 2023-01-16 NOTE — TOC Progression Note (Signed)
Transition of Care [facility]) - Progression Note    Patient Details  Name: Baylin Suba MRN: [id number] Date of Birth: May 28, 1950  Transition of Care [facility]) CM/SW Contact  Zyeir Dymek, Olegario Messier, RN Phone Number: 01/16/2023, 1:55 PM  Clinical Narrative:  Noted palliative meeting-comfort care-spoke to dtr Laroy Apple currently agree.     Expected Discharge Plan: Hospice Medical Facility Barriers to Discharge: Continued Medical Work up  Expected Discharge Plan and Services       Living arrangements for the past 2 months: Single Family Home                                       Social Determinants of Health (SDOH) Interventions SDOH Screenings   Food Insecurity: Patient Unable To Answer (01/14/2023)  Housing: Patient Unable To Answer (01/14/2023)  Recent Concern: Housing - Medium Risk (12/27/2022)  Transportation Needs: Patient Unable To Answer (01/14/2023)  Utilities: Patient Unable To Answer (01/14/2023)  Alcohol Screen: Low Risk  (07/29/2021)  Depression (PHQ2-9): Low Risk  (07/05/2022)  Financial Resource Strain: Low Risk  (12/30/2022)  Physical Activity: Insufficiently Active (07/29/2021)  Social Connections: Moderately Integrated (07/29/2021)  Stress: No Stress Concern Present (07/29/2021)  Tobacco Use: Medium Risk (01/14/2023)    Readmission Risk Interventions     No data to display

## 2023-01-16 NOTE — Progress Notes (Signed)
Daily Progress Note   Patient Name: Gavin Dennis       Date: 01/16/2023 DOB: 01-20-1951  Age: 72 y.o. MRN#: [id number] Attending Physician: [name], MD Primary Care Physician: Ollen Bowl, MD Admit Date: 01-30-23  Reason for Consultation/Follow-up: Establishing goals of care and Terminal Care  Patient Profile/HPI:  72 y.o. male  with past medical history of metastatic non-small cell lung cancer (diagnosed February 2024 and s/p concurrent chemo radiation with partial response), CVA in 2022, and type 2 diabetes.    Patient was diagnosed with lung cancer in February 2024. He underwent concurrent chemoradiation with partial response. He was recently found to have disease progression with mets to liver/ribs/spine as well as T-10 compression fracture.   He was hospitalized 12/27/2022 through 01/08/2023 with pulmonary embolism.  He also started palliative radiation to the T-spine.   He was admitted to [facility] from radiation oncology clinic on 2023/01/30 with uncontrolled pain and shortness of breath. Found to have lactic acidosis and pleural effusion, SIRS.   9/16- returned from radiation tachypneic, tachycardic, hypoxic- Chest xray revealed increasing opacities- likely aspiration event.  9/17- continued to be hypoxic, O2 up to 15L, hypotensive    Subjective: Evaluated patient. He was in significant distress, moaning, brow furrowed.  I spoke to family on phone and then again at bedside.  I am concerned Gavin Dennis is dying and he is suffering.  We discussed he is on maximum oxygen support and he was clear in his wish that he did not want to be placed on ventilator support.  Discussed transition to comfort measures, providing symptom management and support through end of life process.  Family is in  agreement with plan.   Review of Systems  Unable to perform ROS: Acuity of condition     Physical Exam Vitals and nursing note reviewed.  Constitutional:      General: He is in acute distress.     Appearance: He is ill-appearing.  Pulmonary:     Comments: Increased rate and effort Skin:    General: Skin is warm and dry.  Neurological:     Mental Status: He is disoriented.     Comments: Opens eyes, but does not answer questions             Vital Signs: BP (!) 88/77 (BP Location:  Right Arm) Comment: RN notified  Pulse (!) 146   Temp 97.7 F (36.5 C) (Axillary)   Resp (!) 26   Ht 5\' 11"  (1.803 m)   Wt 63.5 kg   SpO2 90%   BMI 19.52 kg/m  SpO2: SpO2: 90 % O2 Device: O2 Device: High Flow Nasal Cannula O2 Flow Rate: O2 Flow Rate (L/min): 15 L/min  Intake/output summary:  Intake/Output Summary (Last 24 hours) at 01/16/2023 1204 Last data filed at 01/16/2023 0600 Gross per 24 hour  Intake 1925.91 ml  Output 600 ml  Net 1325.91 ml   LBM: Last BM Date : 01/16/23 Baseline Weight: Weight: 77 kg Most recent weight: Weight: 63.5 kg       Palliative Assessment/Data: PPS: 10%      Patient Active Problem List   Diagnosis Date Noted   Uncontrolled pain 01/12/2023   Tachycardia 01/06/2023   Hypercalcemia 12/30/2022   Anorexia 12/29/2022   Diarrhea 12/27/2022   AKI (acute kidney injury) (HCC) 12/27/2022   Pulmonary embolism (HCC) 12/27/2022   Pathologic thoracic fracture 12/09/2022   Metastatic cancer to liver (HCC) 12/09/2022   Neutropenia (HCC) 07/24/2022   Malignant neoplasm of unspecified part of unspecified bronchus or lung (HCC) 06/26/2022   Mediastinal lymphadenopathy 06/12/2022   Stroke (cerebrum) (HCC) 03/13/2022   Aneurysm of ascending aorta without rupture (HCC) 08/26/2021   Atherosclerosis of coronary artery without angina pectoris 08/26/2021   Cervical disc disorder 08/26/2021   Essential hypertension 08/26/2021   Hyperlipidemia 08/26/2021   Hardening  of the aorta (main artery of the heart) (HCC) 08/26/2021   Hypomagnesemia 08/26/2021   Other intervertebral disc degeneration, lumbar region 08/26/2021   Pulmonary emphysema (HCC) 08/26/2021   Type 2 diabetes mellitus with other specified complication (HCC) 08/26/2021   History of stroke 07/10/2020   Visual disturbance    CVA (cerebral vascular accident) (HCC) 07/09/2020    Palliative Care Assessment & Plan    Assessment/Recommendations/Plan  Transition to full comfort measures only Start hydromorphone infusion for pain and respiratory distress Other comfort medications as ordered   Code Status: DNR  Prognosis:  Hours - Days  Discharge Planning: Anticipated Hospital Death  Care plan was discussed with family and care team.   Thank you for allowing the Palliative Medicine Team to assist in the care of this patient.   Greater than 50%  of this time was spent counseling and coordinating care related to the above assessment and plan.  Ocie Bob, AGNP-C Palliative Medicine   Please contact Palliative Medicine Team phone at [phone] for questions and concerns.

## 2023-01-16 NOTE — Progress Notes (Signed)
   01/16/23 0740  Oxygen Therapy/Pulse Ox  O2 Device HFNC  O2 Therapy (S)  Oxygen humidified (Checked safety valve, working properly on salter, 10 L [place], 88% obtaining NTS PRN order, increased to 15 L HFNC. Continuous pulse ox @ bedside, rhonchi noted bilaterally.)  O2 Flow Rate (L/min) 15 L/min  SpO2 90 %  Safety Instructions Yes (Comment)

## 2023-01-17 ENCOUNTER — Ambulatory Visit: Payer: Medicare HMO

## 2023-01-17 ENCOUNTER — Encounter: Payer: Self-pay | Admitting: Radiation Oncology

## 2023-01-17 DIAGNOSIS — Z515 Encounter for palliative care: Secondary | ICD-10-CM | POA: Diagnosis not present

## 2023-01-17 DIAGNOSIS — J690 Pneumonitis due to inhalation of food and vomit: Secondary | ICD-10-CM

## 2023-01-17 DIAGNOSIS — R52 Pain, unspecified: Secondary | ICD-10-CM | POA: Diagnosis not present

## 2023-01-17 DIAGNOSIS — C3490 Malignant neoplasm of unspecified part of unspecified bronchus or lung: Secondary | ICD-10-CM | POA: Diagnosis not present

## 2023-01-17 NOTE — Progress Notes (Signed)
Assumed care from off going RN. No changes in initial AM assessment at this time. Pt is end of life and comfort provided. Dilaudid gtt infusing at this time. Resp 2-3 per minute.  Resting comfortably. Cont to provide comfort

## 2023-01-17 NOTE — TOC Progression Note (Signed)
Transition of Care [facility]) - Progression Note    Patient Details  Name: Gavin Dennis MRN: [id number] Date of Birth: [birth year]  Transition [facility]) CM/SW Contact  Quinterious Walraven, Olegario Messier, RN Phone Number: 01/17/2023, 11:38 AM  Clinical Narrative:  Noted anticipated hospital demise-Full comfort care.     Expected Discharge Plan: Hospice Medical Facility Barriers to Discharge: Continued Medical Work up  Expected Discharge Plan and Services       Living arrangements for the past 2 months: Single Family Home                                       Social Determinants of Health (SDOH) Interventions SDOH Screenings   Food Insecurity: Patient Unable To Answer (01/14/2023)  Housing: Patient Unable To Answer (01/14/2023)  Recent Concern: Housing - Medium Risk (12/27/2022)  Transportation Needs: Patient Unable To Answer (01/14/2023)  Utilities: Patient Unable To Answer (01/14/2023)  Alcohol Screen: Low Risk  (07/29/2021)  Depression (PHQ2-9): Low Risk  (07/05/2022)  Financial Resource Strain: Low Risk  (12/30/2022)  Physical Activity: Insufficiently Active (07/29/2021)  Social Connections: Moderately Integrated (07/29/2021)  Stress: No Stress Concern Present (07/29/2021)  Tobacco Use: Medium Risk (01/14/2023)    Readmission Risk Interventions     No data to display

## 2023-01-17 NOTE — Plan of Care (Signed)
End of life. Providing comfort care

## 2023-01-17 NOTE — Progress Notes (Signed)
Called and spoke with daughter in law and also son this morning. Informed them if they could at all possible to come be at the bedside that the patient has significantly had a decline since yesterday. This writer has been at the bedside sitting with the patient as much as possible throughout this morning. Will continue to monitor.

## 2023-01-17 NOTE — Progress Notes (Signed)
Radiation Oncology         [phone] ________________________________  Name: Gavin Dennis MRN: [id number]  Date: 01/17/2023  DOB: [birth year]  End of Treatment Note  Diagnosis:   Metastatic NSCLC, adenocarcinoma of the lung with spinal metastases.     Indication for treatment: Palliative       Radiation treatment dates:   01/03/23-01/15/23  Site/planned dose:   The thoracic spine was to be treated with palliative radiation to a total dose of 30 Gy in 10 fractions, however after 7 fractions totaling 21 Gy, his condition worsened and decisions were made by his family to proceed with comfort care and his radiation treatment was discontinued.   Plan: We will be available as needed to the patient and his family.     Osker Mason, PAC

## 2023-01-17 NOTE — Progress Notes (Signed)
Daily Progress Note   Patient Name: Gavin Dennis       Date: 01/17/2023 DOB: 1951/03/14  Age: 72 y.o. MRN#: [id number] Attending Physician: Elease Etienne, MD Primary Care Physician: Ollen Bowl, MD Admit Date: 01/28/23  Reason for Consultation/Follow-up: Establishing goals of care and Terminal Care  Patient Profile/HPI:  72 y.o. male  with past medical history of metastatic non-small cell lung cancer (diagnosed February 2024 and s/p concurrent chemo radiation with partial response), CVA in 2022, and type 2 diabetes.    Patient was diagnosed with lung cancer in February 2024. He underwent concurrent chemoradiation with partial response. He was recently found to have disease progression with mets to liver/ribs/spine as well as T-10 compression fracture.   He was hospitalized 12/27/2022 through 01/08/2023 with pulmonary embolism.  He also started palliative radiation to the T-spine.   He was admitted to [facility] from radiation oncology clinic on January 28, 2023 with uncontrolled pain and shortness of breath. Found to have lactic acidosis and pleural effusion, SIRS.   9/16- returned from radiation tachypneic, tachycardic, hypoxic- Chest xray revealed increasing opacities- likely aspiration event.  9/17- continued to be hypoxic, O2 up to 15L, hypotensive    Subjective: Evaluated patient. He is actively dying. Unresponsive. Appears comfortable. No family at bedside.    Review of Systems  Unable to perform ROS: Acuity of condition     Physical Exam Vitals and nursing note reviewed.  Constitutional:      General: He is not in acute distress.    Appearance: He is ill-appearing.  Pulmonary:     Comments: Decreased rate Skin:    General: Skin is warm and dry.  Neurological:     Comments:  unresponsive             Vital Signs: BP (!) 88/77 (BP Location: Right Arm) Comment: RN notified  Pulse (!) 146   Temp 97.7 F (36.5 C) (Axillary)   Resp (!) 26   Ht 5\' 11"  (1.803 m)   Wt 63.5 kg   SpO2 90%   BMI 19.52 kg/m  SpO2: SpO2: 90 % O2 Device: O2 Device: High Flow Nasal Cannula O2 Flow Rate: O2 Flow Rate (L/min): 15 L/min  Intake/output summary:  Intake/Output Summary (Last 24 hours) at 01/17/2023 1412 Last data filed at 01/16/2023 1500 Gross per 24 hour  Intake 2376.2 ml  Output --  Net 2376.2 ml   LBM: Last BM Date : 01/16/23 Baseline Weight: Weight: 77 kg Most recent weight: Weight: 63.5 kg       Palliative Assessment/Data: PPS: 10%      Patient Active Problem List   Diagnosis Date Noted   Uncontrolled pain 01/11/2023   Tachycardia 01/06/2023   Hypercalcemia 12/30/2022   Anorexia 12/29/2022   Diarrhea 12/27/2022   AKI (acute kidney injury) (HCC) 12/27/2022   Pulmonary embolism (HCC) 12/27/2022   Pathologic thoracic fracture 12/09/2022   Metastatic cancer to liver (HCC) 12/09/2022   Neutropenia (HCC) 07/24/2022   Malignant neoplasm of unspecified part of unspecified bronchus or lung (HCC) 06/26/2022   Mediastinal lymphadenopathy 06/12/2022   Stroke (cerebrum) (HCC) 03/13/2022   Aneurysm of ascending aorta without rupture (HCC) 08/26/2021   Atherosclerosis of coronary artery without angina pectoris 08/26/2021   Cervical disc disorder 08/26/2021   Essential hypertension 08/26/2021   Hyperlipidemia 08/26/2021   Hardening of the aorta (main artery of the heart) (HCC) 08/26/2021   Hypomagnesemia 08/26/2021   Other intervertebral disc degeneration, lumbar region 08/26/2021   Pulmonary emphysema (HCC) 08/26/2021   Type 2 diabetes mellitus with other specified complication (HCC) 08/26/2021   History of stroke 07/10/2020   Visual disturbance    CVA (cerebral vascular accident) (HCC) 07/09/2020    Palliative Care Assessment & Plan     Assessment/Recommendations/Plan  Actively dying- I would not be surprised if he does not survive the night Continue current comfort interventions Appreciate RN care of patient  Code Status: DNR  Prognosis:  Hours - Days  Discharge Planning: Anticipated Hospital Death  Care plan was discussed with care team.    Thank you for allowing the Palliative Medicine Team to assist in the care of this patient.   Greater than 50%  of this time was spent counseling and coordinating care related to the above assessment and plan.  Ocie Bob, AGNP-C Palliative Medicine   Please contact Palliative Medicine Team phone at [phone] for questions and concerns.

## 2023-01-17 NOTE — Progress Notes (Addendum)
TRH Progress Note  Brief Hospital course:  72 year old male with history of lung cancer with mets to liver/ribs/scapula/vertebrae, was offered palliative/hospice services but patient declined and wanted to pursue further treatment, neutropenia history of CVA, hypertension, history of PE, T2DM, ascending aortic aneurysm, emphysema recent hospitalization 8/28 - 9/9 for tachycardia/SVT/acute PE, diarrhea sent from radiation oncology on 822 mid to low back pain, shortness of breath.  It was suspected that his presentation was due to lung cancer with mets.  Overall poor prognosis even on admission.  He then had an aspiration event with progressive pneumonia complicating treatment resistant malignancy.  He progressively worsened, palliative care team was consulted and he was transitioned to full comfort care on 01/16/2023.  Subjective: Patient unresponsive and cannot provide any history.  Objective: Elderly male, frail and chronically ill looking lying comfortably in bed, mouth breathing with bradypnea but not gasping just yet. No recent vital signs in chart Respiratory system: Decreased breath sounds bilaterally with harsh breath sounds.  No increased work of breathing. CNS: Unresponsive but appears to be comfortable and in no pain or distress.  Assessment and plan:  Acute respiratory distress Small-to-moderate right pleural effusion Lung cancer with mets to liver/ribs/scapula/vertebrae Cancer related pain Aspiration pneumonia Lactic acidosis Thrombocytopenia Hypercalcemia Recent PE History of stroke HTN Type II DM Ascending aortic aneurysm  Continue with comfort care.  Is on Dilaudid infusion and other meds per comfort care order set.  Palliative care following.  Hospital demise anticipated.  Would be unsafe at this time to discharge him to home or residential hospice.  Marcellus Scott, MD,  FACP, [facility], [facility], [facility]   [facility] Advisor [place]     To  contact the attending provider between 7A-7P or the covering provider during after hours 7P-7A, please log into the web site [email or web address] and access using universal [place] password for that web site. If you do not have the password, please call the hospital operator.

## 2023-01-17 NOTE — Progress Notes (Signed)
Noted pt has transitioned to comfort care. We will discontinue radiation and sign off, but please contact our service if there are questions or concerns that we can assist in.      Osker Mason, PAC

## 2023-01-18 ENCOUNTER — Ambulatory Visit: Payer: Medicare HMO | Admitting: Physician Assistant

## 2023-01-18 ENCOUNTER — Other Ambulatory Visit: Payer: Medicare HMO

## 2023-01-18 ENCOUNTER — Ambulatory Visit: Payer: Medicare HMO

## 2023-01-18 DIAGNOSIS — J690 Pneumonitis due to inhalation of food and vomit: Secondary | ICD-10-CM | POA: Diagnosis not present

## 2023-01-18 DIAGNOSIS — C3490 Malignant neoplasm of unspecified part of unspecified bronchus or lung: Secondary | ICD-10-CM | POA: Diagnosis not present

## 2023-01-18 DIAGNOSIS — Z515 Encounter for palliative care: Secondary | ICD-10-CM | POA: Diagnosis not present

## 2023-01-18 LAB — CUP PACEART REMOTE DEVICE CHECK
Date Time Interrogation Session: 20240918231237
Implantable Pulse Generator Implant Date: 20231123

## 2023-01-19 ENCOUNTER — Ambulatory Visit: Payer: Medicare HMO

## 2023-01-19 NOTE — Radiation Completion Notes (Signed)
Patient Name: Gavin Dennis, Gavin Dennis MRN: [id number] Date of Birth: 1950/10/06 Referring Physician: Si Gaul, M.D. Date of Service: 2023-01-19 Radiation Oncologist: Arnette Schaumann, M.D. [facility] -                              RADIATION ONCOLOGY END OF TREATMENT NOTE     Diagnosis: C79.51 Secondary malignant neoplasm of bone Staging on 2022-06-26: Malignant neoplasm of unspecified part of unspecified bronchus or lung (HCC) T=cT0, N=cN2, M=cM0 Intent: Palliative     ==========DELIVERED PLANS==========  First Treatment Date: 2023-01-02 - Last Treatment Date: 2023-01-15   Plan Name: Spine_T Site: Thoracic Spine Technique: 3D Mode: Photon Dose Per Fraction: 3 Gy Prescribed Dose (Delivered / Prescribed): 21 Gy / 30 Gy Prescribed Fxs (Delivered / Prescribed): 7 / 10     ==========ON TREATMENT VISIT DATES========== 2023-01-05, 2023-01-12     ==========UPCOMING VISITS==========       ==========APPENDIX - ON TREATMENT VISIT NOTES==========   See weekly On Treatment Notes in Epic for details.

## 2023-01-20 LAB — CULTURE, BLOOD (ROUTINE X 2)
Culture: NO GROWTH
Culture: NO GROWTH
Special Requests: ADEQUATE
Special Requests: ADEQUATE

## 2023-01-22 ENCOUNTER — Other Ambulatory Visit: Payer: Medicare HMO

## 2023-01-22 ENCOUNTER — Ambulatory Visit: Payer: Medicare HMO

## 2023-01-22 ENCOUNTER — Ambulatory Visit: Payer: Medicare HMO | Admitting: Physician Assistant

## 2023-01-30 NOTE — Progress Notes (Signed)
Death pronounced and confirmed at 1214 by Lavena Bullion RN and Benjaman Lobe RN.  Son called and notified, coming to hospital at this time.  Dr. Waymon Amato made aware.  [place] notified and eye prep completed.  Bed placement aware as well.

## 2023-01-30 NOTE — Progress Notes (Signed)
35 ml Dilaudid wasted from bag into stericycle- verified with Tracey Harries RN

## 2023-01-30 NOTE — Progress Notes (Signed)
Daily Progress Note   Patient Name: Gavin Dennis       Date: 01/14/2023 DOB: 1951-02-27  Age: 72 y.o. MRN#: [id number] Attending Physician: Elease Etienne, MD Primary Care Physician: Ollen Bowl, MD Admit Date: 01-27-23  Reason for Consultation/Follow-up: Establishing goals of care and Terminal Care  Patient Profile/HPI:  72 y.o. male  with past medical history of metastatic non-small cell lung cancer (diagnosed February 2024 and s/p concurrent chemo radiation with partial response), CVA in 2022, and type 2 diabetes.    Patient was diagnosed with lung cancer in February 2024. He underwent concurrent chemoradiation with partial response. He was recently found to have disease progression with mets to liver/ribs/spine as well as T-10 compression fracture.   He was hospitalized 12/27/2022 through 01/08/2023 with pulmonary embolism.  He also started palliative radiation to the T-spine.   He was admitted to [facility] from radiation oncology clinic on 01/27/2023 with uncontrolled pain and shortness of breath. Found to have lactic acidosis and pleural effusion, SIRS.   9/16- returned from radiation tachypneic, tachycardic, hypoxic- Chest xray revealed increasing opacities- likely aspiration event.  9/17- continued to be hypoxic, O2 up to 15L, hypotensive    Subjective: Evaluated patient. He is actively dying. Unresponsive. Appears comfortable. No family at bedside.    Review of Systems  Unable to perform ROS: Acuity of condition     Physical Exam Vitals and nursing note reviewed.  Constitutional:      General: He is not in acute distress.    Appearance: He is ill-appearing.  Pulmonary:     Comments: Decreased rate Skin:    General: Skin is warm and dry.  Neurological:     Comments:  unresponsive             Vital Signs: BP (!) 88/77 (BP Location: Right Arm) Comment: RN notified  Pulse (!) 146   Temp 97.7 F (36.5 C) (Axillary)   Resp (!) 26   Ht 5\' 11"  (1.803 m)   Wt 63.5 kg   SpO2 90%   BMI 19.52 kg/m  SpO2: SpO2: 90 % O2 Device: O2 Device: High Flow Nasal Cannula O2 Flow Rate: O2 Flow Rate (L/min): 15 L/min  Intake/output summary:  Intake/Output Summary (Last 24 hours) at 01/09/2023 1358 Last data filed at 01/24/2023 1215 Gross per 24 hour  Intake 51.66 ml  Output 0 ml  Net 51.66 ml   LBM: Last BM Date : 01/16/23 Baseline Weight: Weight: 77 kg Most recent weight: Weight: 63.5 kg       Palliative Assessment/Data: PPS: 10%      Patient Active Problem List   Diagnosis Date Noted   Uncontrolled pain 02/04/2023   Tachycardia 01/06/2023   Hypercalcemia 12/30/2022   Anorexia 12/29/2022   Diarrhea 12/27/2022   AKI (acute kidney injury) (HCC) 12/27/2022   Pulmonary embolism (HCC) 12/27/2022   Pathologic thoracic fracture 12/09/2022   Metastatic cancer to liver (HCC) 12/09/2022   Neutropenia (HCC) 07/24/2022   Malignant neoplasm of unspecified part of unspecified bronchus or lung (HCC) 06/26/2022   Mediastinal lymphadenopathy 06/12/2022   Stroke (cerebrum) (HCC) 03/13/2022   Aneurysm of ascending aorta without rupture (HCC) 08/26/2021   Atherosclerosis of coronary artery without angina pectoris 08/26/2021   Cervical disc disorder 08/26/2021   Essential hypertension 08/26/2021   Hyperlipidemia 08/26/2021   Hardening of the aorta (main artery of the heart) (HCC) 08/26/2021   Hypomagnesemia 08/26/2021   Other intervertebral disc degeneration, lumbar region 08/26/2021   Pulmonary emphysema (HCC) 08/26/2021   Type 2 diabetes mellitus with other specified complication (HCC) 08/26/2021   History of stroke 07/10/2020   Visual disturbance    CVA (cerebral vascular accident) (HCC) 07/09/2020    Palliative Care Assessment & Plan     Assessment/Recommendations/Plan  Actively dying Continue current comfort interventions   Code Status: DNR  Prognosis:  Hours - Days  Discharge Planning: Anticipated Hospital Death  Care plan was discussed with care team.    Thank you for allowing the Palliative Medicine Team to assist in the care of this patient.   Greater than 50%  of this time was spent counseling and coordinating care related to the above assessment and plan.  Ocie Bob, AGNP-C Palliative Medicine   Please contact Palliative Medicine Team phone at [phone] for questions and concerns.

## 2023-01-30 NOTE — TOC Progression Note (Signed)
Transition of Care [facility]) - Progression Note    Patient Details  Name: Gavin Dennis MRN: [id number] Date of Birth: 10-17-1950  Transition of Care [facility]) CM/SW Contact  Darvin Dials Aris Lot, [place] Phone Number: 01/17/2023, 2:52 PM  Clinical Narrative:     CSW is informed that pt's daughter in law requesting information about funeral expense assistance from the [place]. CSW called daughter in law and recommended she reach out to DSS or funeral homes as they would be more aware of related assistance programs that exist.     Expected Discharge Plan and Services       Living arrangements for the past 2 months: Single Family Home                                   Social Determinants of Health (SDOH) Interventions SDOH Screenings   Food Insecurity: Patient Unable To Answer (01/14/2023)  Housing: Patient Unable To Answer (01/14/2023)  Recent Concern: Housing - Medium Risk (12/27/2022)  Transportation Needs: Patient Unable To Answer (01/14/2023)  Utilities: Patient Unable To Answer (01/14/2023)  Alcohol Screen: Low Risk  (07/29/2021)  Depression (PHQ2-9): Low Risk  (07/05/2022)  Financial Resource Strain: Low Risk  (12/30/2022)  Physical Activity: Insufficiently Active (07/29/2021)  Social Connections: Moderately Integrated (07/29/2021)  Stress: No Stress Concern Present (07/29/2021)  Tobacco Use: Medium Risk (01/14/2023)    Readmission Risk Interventions     No data to display

## 2023-01-30 NOTE — Progress Notes (Signed)
[facility] Progress Note  Brief Hospital course:  72 year old male with history of lung cancer with mets to liver/ribs/scapula/vertebrae, was offered palliative/hospice services but patient declined and wanted to pursue further treatment, neutropenia history of CVA, hypertension, history of PE, T2DM, ascending aortic aneurysm, emphysema recent hospitalization 8/28 - 9/9 for tachycardia/SVT/acute PE, diarrhea sent from radiation oncology on 822 mid to low back pain, shortness of breath.  It was suspected that his presentation was due to lung cancer with mets.  Overall poor prognosis even on admission.  He then had an aspiration event with progressive pneumonia complicating treatment resistant malignancy.  He progressively worsened, palliative care team was consulted and he was transitioned to full comfort care on 01/16/2023.  Subjective: Patient unresponsive and cannot provide any history.  Overnight RN note appreciated.  Reportedly breathing 2 to 3/min but currently higher.  Objective: Elderly male, frail and chronically ill looking lying comfortably in bed, mouth breathing with bradypnea but not gasping.  Respiratory rate may be 6 to 8/min. No recent vital signs in chart Respiratory system: Decreased breath sounds bilaterally with harsh breath sounds.  No increased work of breathing. CNS: Unresponsive but appears to be comfortable and in no pain or distress.  Assessment and plan:  Acute respiratory distress Small-to-moderate right pleural effusion Lung cancer with mets to liver/ribs/scapula/vertebrae Cancer related pain Aspiration pneumonia Lactic acidosis Thrombocytopenia Hypercalcemia Recent PE History of stroke HTN Type II DM Ascending aortic aneurysm  Continue with end-of-life comfort care.  Is on Dilaudid infusion and other meds per comfort care order set.  Palliative care following.  Hospital demise anticipated.  Would be unsafe at this time to discharge him to home or residential  hospice.  Gavin Scott, MD,  FACP, [facility], [facility], [facility]   [facility] [place]     To contact the attending provider between 7A-7P or the covering provider during after hours 7P-7A, please log into the web site [email or web address] and access using universal [place] password for that web site. If you do not have the password, please call the hospital operator.

## 2023-01-30 NOTE — Care Management Important Message (Signed)
Important Message  Patient Details No IM Letter given due to Comfort Care being provided. Name: Gavin Dennis MRN: [id number] Date of Birth: 26-Jan-1951   Medicare Important Message Given:  No     [name] 01/12/2023, 11:44 AM

## 2023-01-30 NOTE — Death Summary Note (Signed)
DEATH SUMMARY   Patient Details  Name: Gavin Dennis MRN: [id number] DOB: Nov 06, 1950 [id number], [name], MD Admission/Discharge Information   Admit Date:  01-28-23  Date of Death: Date of Death: 02/05/23  Time of Death: Time of Death: 1213-04-07  Length of Stay: 7   Principle Cause of death: Metastatic lung cancer  Hospital Diagnoses: Principal Problem:   Uncontrolled pain   Hospital Course: 72 year old male with history of lung cancer with mets to liver/ribs/scapula/vertebrae, was offered palliative/hospice services but patient declined and wanted to pursue further treatment, neutropenia history of CVA, hypertension, history of PE, T2DM, ascending aortic aneurysm, emphysema recent hospitalization 8/28 - 9/9 for tachycardia/SVT/acute PE, diarrhea sent from radiation oncology on 822 mid to low back pain, shortness of breath. It was suspected that his presentation was due to lung cancer with mets. Overall poor prognosis even on admission. He then had an aspiration event with progressive pneumonia complicating treatment resistant malignancy. He progressively worsened, palliative care team was consulted and he was transitioned to full comfort care on 01/16/2023.   Problem list:  Acute respiratory distress Small-to-moderate right pleural effusion Lung cancer with mets to liver/ribs/scapula/vertebrae Cancer related pain Aspiration pneumonia Lactic acidosis Thrombocytopenia Hypercalcemia Recent PE History of stroke HTN Type II DM Ascending aortic aneurysm   The results of significant diagnostics from this hospitalization (including imaging, microbiology, ancillary and laboratory) are listed below for reference.   Significant Diagnostic Studies: CUP PACEART REMOTE DEVICE CHECK  Result Date: 02-05-23 ILR summary report received. Battery status OK. Normal device function. No new symptom, brady, or pause episodes. No new AF episodes. Monthly summary reports and ROV/PRN Tachy x6,  longest duration 8/28, 54sec, mean HR's 162-188 Pt was seen in the ED on the 28th and responded to fluids LA, CVRS  CT Angio Chest Pulmonary Embolism (PE) W or WO Contrast  Result Date: 01/15/2023 CLINICAL DATA:  Short of breath, concern pulmonary embolism. * Tracking Code: BO * EXAM: CT ANGIOGRAPHY CHEST WITH CONTRAST TECHNIQUE: Multidetector CT imaging of the chest was performed using the standard protocol during bolus administration of intravenous contrast. Multiplanar CT image reconstructions and MIPs were obtained to evaluate the vascular anatomy. RADIATION DOSE REDUCTION: This exam was performed according to the departmental dose-optimization program which includes automated exposure control, adjustment of the mA and/or kV according to patient size and/or use of iterative reconstruction technique. CONTRAST:  75mL OMNIPAQUE IOHEXOL 350 MG/ML SOLN COMPARISON:  None Available. FINDINGS: Cardiovascular: No filling defects within the pulmonary arteries to suggest acute pulmonary embolism. Mediastinum/Nodes: High prevascular lymph node measuring 12 mm (image 41/5) unchanged from prior. Lungs/Pleura: Upper Abdomen: Increased perihilar consolidation in the RIGHT middle lobe. Mild increased consolidation in superior segment of the RIGHT lower lobe. Air bronchograms present. LEFT lung relatively clear. Low-density lesions in liver not well characterized concerning for metastasis. Thickened LEFT adrenal gland to 17 mm consistent metastatic disease. MSK: Lytic lesions within LEFT and RIGHT scapula. Lytic lesions of thoracic spine. No change from prior. IMPRESSION: 1. No evidence acute pulmonary embolism. 2. Increased peribronchial consolidation in the RIGHT middle lobe. Findings concerning for progression malignancy versus expanding pneumonia. 3. Mild mediastinal lymphadenopathy is unchanged. 4. Multiple lytic skeletal metastasis within the spine and bilateral scapula unchanged. Electronically Signed   By:  Genevive Bi M.D.   On: 01/15/2023 18:03   DG CHEST PORT 1 VIEW  Result Date: 01/15/2023 CLINICAL DATA:  Tachypnea EXAM: PORTABLE CHEST 1 VIEW COMPARISON:  X-ray 01/11/2023 FINDINGS: Film is rotated to the right.  Left lung is clear. No left-sided pneumothorax or effusion. Cardiac silhouette is obscured by the rotation. Dilated aorta. There is diffuse parenchymal opacity seen in the right lung which is increased from previous. Possible small right effusion as well. No right-sided pneumothorax. IMPRESSION: Limited x-ray with significant rotation. Increasing diffuse parenchymal opacities in the right lung with possible right effusion. Acute infiltrative process is possible. Recommend follow-up or additional evaluation. Electronically Signed   By: Karen Kays M.D.   On: 01/15/2023 14:53   CT HEAD WO CONTRAST ( )  Result Date: 01/11/2023 CLINICAL DATA:  Stroke suspected EXAM: CT HEAD WITHOUT CONTRAST TECHNIQUE: Contiguous axial images were obtained from the base of the skull through the vertex without intravenous contrast. RADIATION DOSE REDUCTION: This exam was performed according to the departmental dose-optimization program which includes automated exposure control, adjustment of the mA and/or kV according to patient size and/or use of iterative reconstruction technique. COMPARISON:  03/12/2022 FINDINGS: Brain: No evidence of acute infarction, hemorrhage, mass, mass effect, or midline shift. No hydrocephalus or extra-axial fluid collection. Periventricular white matter changes, likely the sequela of chronic small vessel ischemic disease. Encephalomalacia in right frontal and parietal lobe is consistent with expected evolution of the acute infarct noted on 03/13/2022. Periventricular white matter changes, likely the sequela of chronic small vessel ischemic disease. Lacunar infarcts in the bilateral basal ganglia and thalami. Vascular: No hyperdense vessel. Skull: Negative for fracture or focal lesion.  Sinuses/Orbits: Mucosal thickening in the left maxillary sinus. No acute finding in the orbits. Other: The mastoid air cells are well aerated. IMPRESSION: No acute intracranial process. Electronically Signed   By: Wiliam Ke M.D.   On: 01/11/2023 19:41   DG Chest Port 1 View  Result Date: 01/11/2023 CLINICAL DATA:  10026 Shortness of breath 10026 EXAM: PORTABLE CHEST 1 VIEW.  Patient is rotated. COMPARISON:  CT angiography chest 01/07/2023, chest x-ray 12/27/2022 FINDINGS: Wireless cardiac device overlies the left hemithorax. The heart and mediastinal contours are unchanged in the setting of rotation. Persistent right paramediastinal architectural lung distortion, consolidation, bronchiectasis consistent with post radiation treatment. No new focal consolidation. No pulmonary edema. No pleural effusion. No pneumothorax. Right rib lytic-osseous metastases better evaluated on CT angio chest 01/28/2023. IMPRESSION: 1. No acute cardiopulmonary abnormality. 2. Right paramediastinal post radiation treatment changes. 3. Lytic osseous metastases. Electronically Signed   By: Tish Frederickson M.D.   On: 01/11/2023 07:14   CT Angio Chest PE W and/or Wo Contrast  Result Date: 01/24/2023 CLINICAL DATA:  Pulmonary emboli suspected, high probability. Worsening clinical status. EXAM: CT ANGIOGRAPHY CHEST WITH CONTRAST TECHNIQUE: Multidetector CT imaging of the chest was performed using the standard protocol during bolus administration of intravenous contrast. Multiplanar CT image reconstructions and MIPs were obtained to evaluate the vascular anatomy. RADIATION DOSE REDUCTION: This exam was performed according to the departmental dose-optimization program which includes automated exposure control, adjustment of the mA and/or kV according to patient size and/or use of iterative reconstruction technique. CONTRAST:  80mL OMNIPAQUE IOHEXOL 350 MG/ML SOLN COMPARISON:  12/27/2022 FINDINGS: Cardiovascular: Heart size is normal.  No pericardial effusion. Coronary artery calcification and aortic atherosclerotic calcification are present. Pulmonary arterial opacification is excellent. There are no pulmonary emboli. There is resolution of the previously seen emboli in the left upper lobe. No new pulmonary emboli. Mediastinum/Nodes: Similar appearance of hilar and mediastinal adenopathy with mild extrinsic compression of the right proximal lower lobe pulmonary artery. No measurable change over the last 2 weeks. Please see results of prior exam cord  detail nodal measurements. Lungs/Pleura: No significant finding in the left chest. On the right, there has been increase in the amount of pleural fluid layering dependently. This is a small to moderate effusion, not large. Chronic post treatment markings in the right lung are similar to the prior examination. Patient has not developed any new process other than the enlarging effusion. Upper Abdomen: Liver metastases as previously seen, not optimally demonstrated based on the phase of enhancement. No new finding. Musculoskeletal: Lytic metastatic disease again seen in the left scapula, left third rib, right fifth rib and inferior right scapula. T9, T10 and T12 lytic lesions are present. These are not visibly progressive over the short period of time since the comparison study of 08/28. there is certainly potential for canal involvement particularly at the T9 and T10 level. Review of the MIP images confirms the above findings. IMPRESSION: 1. No pulmonary emboli visible today. Resolution of the previously seen emboli in the left upper lobe. 2. Increase in the amount of pleural fluid layering dependently on the right. This is a small to moderate effusion, not large. 3. Chronic post treatment markings in the right lung are similar to the prior examination. 4. Similar appearance of hilar and mediastinal adenopathy with mild extrinsic compression of the right proximal lower lobe pulmonary artery. 5. Liver  metastases as previously seen, not optimally demonstrated based on the phase of enhancement. 6. Lytic metastatic disease in the left scapula, left third rib, right fifth rib and inferior right scapula. T9, T10 and T12 lytic lesions are present. These are not visibly progressive over the short period of time since the comparison study of 08/28. There is certainly potential for canal involvement particularly at the T9 and T10 level. 7. Aortic atherosclerosis. Aortic Atherosclerosis (ICD10-I70.0). Electronically Signed   By: Paulina Fusi M.D.   On: 2023-01-28 18:06   ECHOCARDIOGRAM COMPLETE  Result Date: 12/28/2022    ECHOCARDIOGRAM REPORT   Patient Name:   BELLA OHMANN Date of Exam: 12/28/2022 Medical Rec #:  [id number]    Height:       71.0 in Accession #:    [id number]   Weight:       169.8 lb Date of Birth:  04-10-1951    BSA:          1.967 m Patient Age:    71 years     BP:           124/94 mmHg Patient Gender: M            HR:           100 bpm. Exam Location:  Inpatient Procedure: 2D Echo, Cardiac Doppler and Color Doppler Indications:    Pulmonary embolus  History:        Patient has prior history of Echocardiogram examinations, most                 recent 03/13/2022. Stroke; Risk Factors:Dyslipidemia,                 Hypertension and Diabetes. Lung CA, hx of radiation therapy.  Sonographer:    Milda Smart Referring Phys: [id number] [facility]  Sonographer Comments: Suboptimal parasternal window. Image acquisition challenging due to patient body habitus and Image acquisition challenging due to respiratory motion. IMPRESSIONS  1. Left ventricular ejection fraction, by estimation, is 60 to 65%. The left ventricle has normal function. The left ventricle has no regional wall motion abnormalities. There is mild left ventricular hypertrophy. Left ventricular diastolic parameters are  indeterminate.  2. Right ventricular systolic function is normal. The right ventricular size is mildly enlarged. There is normal  pulmonary artery systolic pressure. The estimated right ventricular systolic pressure is 19.3 mmHg.  3. The mitral valve is grossly normal. Trivial mitral valve regurgitation. No evidence of mitral stenosis.  4. The aortic valve is tricuspid. There is mild calcification of the aortic valve. There is mild thickening of the aortic valve. Aortic valve regurgitation is not visualized. No aortic stenosis is present.  5. The inferior vena cava is normal in size with greater than 50% respiratory variability, suggesting right atrial pressure of 3 mmHg. FINDINGS  Left Ventricle: Left ventricular ejection fraction, by estimation, is 60 to 65%. The left ventricle has normal function. The left ventricle has no regional wall motion abnormalities. The left ventricular internal cavity size was normal in size. There is  mild left ventricular hypertrophy. Left ventricular diastolic parameters are indeterminate. Right Ventricle: The right ventricular size is mildly enlarged. No increase in right ventricular wall thickness. Right ventricular systolic function is normal. There is normal pulmonary artery systolic pressure. The tricuspid regurgitant velocity is 2.02  m/s, and with an assumed right atrial pressure of 3 mmHg, the estimated right ventricular systolic pressure is 19.3 mmHg. Left Atrium: Left atrial size was normal in size. Right Atrium: Right atrial size was normal in size. Pericardium: There is no evidence of pericardial effusion. Mitral Valve: The mitral valve is grossly normal. Trivial mitral valve regurgitation. No evidence of mitral valve stenosis. Tricuspid Valve: The tricuspid valve is normal in structure. Tricuspid valve regurgitation is mild . No evidence of tricuspid stenosis. Aortic Valve: The aortic valve is tricuspid. There is mild calcification of the aortic valve. There is mild thickening of the aortic valve. Aortic valve regurgitation is not visualized. No aortic stenosis is present. Pulmonic Valve: The  pulmonic valve was normal in structure. Pulmonic valve regurgitation is not visualized. No evidence of pulmonic stenosis. Aorta: The aortic root is normal in size and structure. Venous: The inferior vena cava is normal in size with greater than 50% respiratory variability, suggesting right atrial pressure of 3 mmHg. IAS/Shunts: No atrial level shunt detected by color flow Doppler.  LEFT VENTRICLE PLAX 2D LVIDd:         3.90 cm     Diastology LVIDs:         3.10 cm     LV e' medial:  6.09 cm/s LV PW:         1.10 cm     LV e' lateral: 8.59 cm/s LV IVS:        1.10 cm  LV Volumes (MOD) LV vol d, MOD A2C: 40.5 ml LV vol d, MOD A4C: 59.8 ml LV vol s, MOD A2C: 13.3 ml LV vol s, MOD A4C: 17.7 ml LV SV MOD A2C:     27.2 ml LV SV MOD A4C:     59.8 ml LV SV MOD BP:      34.2 ml RIGHT VENTRICLE             IVC RV S prime:     12.10 cm/s  IVC diam: 1.90 cm TAPSE (M-mode): 2.0 cm LEFT ATRIUM             Index LA diam:        3.20 cm 1.63 cm/m LA Vol (A2C):   22.9 ml 11.64 ml/m LA Vol (A4C):   29.8 ml 15.15 ml/m LA Biplane Vol: 27.1 ml 13.78 ml/m  AORTIC VALVE LVOT Vmax:   93.30 cm/s LVOT Vmean:  56.100 cm/s LVOT VTI:    0.126 m  AORTA Ao Root diam: 3.80 cm MR Peak grad: 36.0 mmHg   TRICUSPID VALVE MR Vmax:      300.00 cm/s TR Peak grad:   16.3 mmHg                           TR Vmax:        202.00 cm/s                            SHUNTS                           Systemic VTI: 0.13 m Weston Brass MD Electronically signed by Weston Brass MD Signature Date/Time: 12/28/2022/1:57:25 PM    Final    CT Angio Chest PE W and/or Wo Contrast  Result Date: 12/27/2022 CLINICAL DATA:  Tachypnea, tachycardia, generalized weakness, history of non-small cell lung cancer EXAM: CT ANGIOGRAPHY CHEST WITH CONTRAST TECHNIQUE: Multidetector CT imaging of the chest was performed using the standard protocol during bolus administration of intravenous contrast. Multiplanar CT image reconstructions and MIPs were obtained to evaluate the  vascular anatomy. RADIATION DOSE REDUCTION: This exam was performed according to the departmental dose-optimization program which includes automated exposure control, adjustment of the mA and/or kV according to patient size and/or use of iterative reconstruction technique. CONTRAST:  75mL OMNIPAQUE IOHEXOL 350 MG/ML SOLN COMPARISON:  12/09/2022, 12/27/2022 FINDINGS: Cardiovascular: This is a technically adequate evaluation of the pulmonary vasculature. There is a small nonocclusive left upper lobe segmental pulmonary embolus, reference image 57/4, new since prior study. Clot burden is minimal. No other filling defects. There is extrinsic narrowing of the right main pulmonary artery and right middle lobe pulmonary artery due to increasing soft tissue density at the right hilum, worrisome for progression of disease. The heart is unremarkable without pericardial effusion. 4.5 cm ascending thoracic aortic aneurysm is identified. Evaluation of the aortic lumen is limited due to timing of contrast bolus. Stable atherosclerosis of the aorta and coronary vasculature. Mediastinum/Nodes: Thyroid, trachea, and esophagus are unremarkable. Subcarinal adenopathy measures up to 15 mm in short axis, unchanged since prior exam. Increased soft tissue density at the right hilum measures 1.8 x 3.6 cm reference image 72/4, previously having measured 2.0 x 1.7 cm. This results and increasing extrinsic compression upon the right main pulmonary artery and right middle lobe pulmonary artery. Lungs/Pleura: Upper lobe predominant emphysema again noted. Right perihilar lung consolidation is again noted, slightly more pronounced in the right middle and right lower lobe since prior study, likely sequela of radiation pneumonitis. Trace right pleural effusion is noted. There is some minimal tree in bud nodular airspace disease within the left upper lobe, unchanged since prior exam, likely sequela of inflammation or infection. No pneumothorax.  Central airways are patent. Upper Abdomen: Metastatic lesions within the liver are again identified, measuring up to 4.2 cm in the left lobe image 121/4 and 2.5 cm in the right lobe image 168/4. These lesions are more difficult to appreciate due to timing of contrast bolus. Stable left adrenal mass consistent with metastatic disease. 3.6 cm infrarenal abdominal aortic aneurysm again noted. Musculoskeletal: Large lytic lesions are seen within the left scapula, left third rib, right lateral fifth rib, inferior margin of the right scapula, T10, and T12,  consistent with metastatic disease. There are other smaller lucency seen throughout the thoracic cage consistent with additional metastatic foci, also stable. Review of the MIP images confirms the above findings. IMPRESSION: 1. Small left upper lobe segmental pulmonary embolus, new since prior study. Minimal clot burden without right heart strain. 2. Progressive right hilar mass, with extrinsic compression of the right pulmonary artery, consistent with worsening malignancy. 3. Progressive consolidation within the right perihilar region, greatest in the right lower lobe, compatible with radiation pneumonitis. 4. Continued findings of metastatic disease, with diffuse bony metastases, mediastinal adenopathy, multiple liver lesions, and left adrenal mass as above. These are not appreciably changed since prior staging CT performed 12/09/2022. 5. 4.6 cm ascending thoracic aortic aneurysm. Ascending thoracic aortic aneurysm. Recommend semi-annual imaging followup by CTA or MRA and referral to cardiothoracic surgery if not already obtained. This recommendation follows 2010 ACCF/AHA/AATS/ACR/ASA/SCA/SCAI/SIR/STS/SVM Guidelines for the Diagnosis and Management of Patients With Thoracic Aortic Disease. Circulation. 2010; 121: O962-X528. Aortic aneurysm NOS (ICD10-I71.9) 6. Abdominal aortic aneurysm measuring 3.6 cm infrarenal. Recommend follow-up every 2 years. Reference: J Am  Coll Radiol 2013;10:789-794. Critical Value/emergent results were called by telephone at the time of interpretation on 12/27/2022 at 4:10 pm to provider [facility] , who verbally acknowledged these results. Electronically Signed   By: Sharlet Salina M.D.   On: 12/27/2022 16:10   DG Chest Portable 1 View  Result Date: 12/27/2022 CLINICAL DATA:  Tachypnea, tachycardia, and generalized weakness EXAM: PORTABLE CHEST 1 VIEW COMPARISON:  CTA chest dated 12/09/2022, chest radiograph dated 06/12/2022 FINDINGS: Asymmetric low right lung volumes with irregular right hilar opacity, likely reflecting radiation changes. No pleural effusion or pneumothorax. The heart size and mediastinal contours are within normal limits. No acute osseous abnormality. IMPRESSION: Asymmetric low right lung volumes with irregular right hilar opacity, likely reflecting radiation changes. Electronically Signed   By: Agustin Cree M.D.   On: 12/27/2022 11:50    Microbiology: Recent Results (from the past 240 hour(s))  Resp panel by RT-PCR (RSV, Flu A&B, Covid) Anterior Nasal Swab     Status: None   Collection Time: 02/09/2023  4:04 PM   Specimen: Anterior Nasal Swab  Result Value Ref Range Status   SARS Coronavirus 2 by RT PCR NEGATIVE NEGATIVE Final    Comment: (NOTE) SARS-CoV-2 target nucleic acids are NOT DETECTED.  The SARS-CoV-2 RNA is generally detectable in upper respiratory specimens during the acute phase of infection. The lowest concentration of SARS-CoV-2 viral copies this assay can detect is 138 copies/mL. A negative result does not preclude SARS-Cov-2 infection and should not be used as the sole basis for treatment or other patient management decisions. A negative result may occur with  improper specimen collection/handling, submission of specimen other than nasopharyngeal swab, presence of viral mutation(s) within the areas targeted by this assay, and inadequate number of viral copies(<138 copies/mL). A negative  result must be combined with clinical observations, patient history, and epidemiological information. The expected result is Negative.  Fact Sheet for Patients:  BloggerCourse.com  Fact Sheet for Healthcare Providers:  SeriousBroker.it  This test is no t yet approved or cleared by the [place] FDA and  has been authorized for detection and/or diagnosis of SARS-CoV-2 by FDA under an Emergency Use Authorization (EUA). This EUA will remain  in effect (meaning this test can be used) for the duration of the COVID-19 declaration under Section 564(b)(1) of the Act, 21 U.S.C.section 360bbb-3(b)(1), unless the authorization is terminated  or revoked sooner.  Influenza A by PCR NEGATIVE NEGATIVE Final   Influenza B by PCR NEGATIVE NEGATIVE Final    Comment: (NOTE) The Xpert Xpress SARS-CoV-2/FLU/RSV plus assay is intended as an aid in the diagnosis of influenza from Nasopharyngeal swab specimens and should not be used as a sole basis for treatment. Nasal washings and aspirates are unacceptable for Xpert Xpress SARS-CoV-2/FLU/RSV testing.  Fact Sheet for Patients: BloggerCourse.com  Fact Sheet for Healthcare Providers: SeriousBroker.it  This test is not yet approved or cleared by the [place] FDA and has been authorized for detection and/or diagnosis of SARS-CoV-2 by FDA under an Emergency Use Authorization (EUA). This EUA will remain in effect (meaning this test can be used) for the duration of the COVID-19 declaration under Section 564(b)(1) of the Act, 21 U.S.C. section 360bbb-3(b)(1), unless the authorization is terminated or revoked.     Resp Syncytial Virus by PCR NEGATIVE NEGATIVE Final    Comment: (NOTE) Fact Sheet for Patients: BloggerCourse.com  Fact Sheet for Healthcare Providers: SeriousBroker.it  This  test is not yet approved or cleared by the [place] FDA and has been authorized for detection and/or diagnosis of SARS-CoV-2 by FDA under an Emergency Use Authorization (EUA). This EUA will remain in effect (meaning this test can be used) for the duration of the COVID-19 declaration under Section 564(b)(1) of the Act, 21 U.S.C. section 360bbb-3(b)(1), unless the authorization is terminated or revoked.  Performed at [facility], [address], [place], [place] [address]   Blood culture (routine x 2)     Status: None   Collection Time: 01/02/2023  4:04 PM   Specimen: BLOOD  Result Value Ref Range Status   Specimen Description   Final    BLOOD LEFT ANTECUBITAL Performed at [facility], [address] [place], [place] [address]    Special Requests   Final    BOTTLES DRAWN AEROBIC AND ANAEROBIC Blood Culture adequate volume Performed at [facility], [address] [place], [place] [address]    Culture   Final    NO GROWTH 5 DAYS Performed at [facility], [address], [place], [place] [address]    Report Status 01/15/2023 FINAL  Final  Blood culture (routine x 2)     Status: None   Collection Time: 01/04/2023  4:04 PM   Specimen: BLOOD  Result Value Ref Range Status   Specimen Description   Final    BLOOD RIGHT ANTECUBITAL Performed at [facility], [address], [place], [place] [address]    Special Requests   Final    BOTTLES DRAWN AEROBIC AND ANAEROBIC Blood Culture adequate volume Performed at [facility], [address], [place], [place] [address]    Culture   Final    NO GROWTH 5 DAYS Performed at [facility], [address], [place], [place] [address]    Report Status 01/15/2023 FINAL  Final  Culture, blood (Routine X 2) w Reflex to ID Panel     Status: None (Preliminary result)   Collection Time: 01/15/23  7:35 PM   Specimen: BLOOD LEFT ARM   Result Value Ref Range Status   Specimen Description   Final    BLOOD LEFT ARM Performed at [facility], [address] [place], [place] [address]    Special Requests   Final    BOTTLES DRAWN AEROBIC AND ANAEROBIC Blood Culture adequate volume Performed at [facility], [address] [place], [place] [address]    Culture   Final    NO GROWTH 3 DAYS Performed at [facility], [address], [place], [place] [address]    Report Status PENDING  Incomplete  Culture, blood (Routine X 2) w Reflex to ID Panel     Status: None (Preliminary result)   Collection Time: 01/15/23  7:35 PM   Specimen: BLOOD RIGHT ARM  Result Value Ref Range Status   Specimen Description   Final    BLOOD RIGHT ARM Performed at [facility], [address] [place], [place] [address]    Special Requests   Final    BOTTLES DRAWN AEROBIC AND ANAEROBIC Blood Culture adequate volume Performed at [facility], [address], [place], [place] [address]    Culture   Final    NO GROWTH 3 DAYS Performed at [facility], [address] [place], [place] [address]    Report Status PENDING  Incomplete  MRSA Next Gen by PCR, Nasal     Status: None   Collection Time: 01/15/23  9:13 PM   Specimen: Nasal Mucosa; Nasal Swab  Result Value Ref Range Status   MRSA by PCR Next Gen NOT DETECTED NOT DETECTED Final    Comment: (NOTE) The GeneXpert MRSA Assay (FDA approved for NASAL specimens only), is one component of a comprehensive MRSA colonization surveillance program. It is not intended to diagnose MRSA infection nor to guide or monitor treatment for MRSA infections. Test performance is not FDA approved in patients less than 82 years old. Performed at [facility], [address], [place], [place] [address]     Time spent: 15 minutes  Signed: Marcellus Scott, MD 2023-02-12

## 2023-01-30 DEATH — deceased

## 2023-02-26 ENCOUNTER — Ambulatory Visit: Payer: Medicare HMO
